# Patient Record
Sex: Female | Born: 1955 | Race: White | Hispanic: No | Marital: Married | Smoking: Never smoker
Health system: Southern US, Community
[De-identification: ages and names within clinical notes are randomized; demographics above are authoritative.]

## PROBLEM LIST (undated history)

## (undated) DIAGNOSIS — E785 Hyperlipidemia, unspecified: Secondary | ICD-10-CM

## (undated) DIAGNOSIS — T7840XA Allergy, unspecified, initial encounter: Secondary | ICD-10-CM

## (undated) DIAGNOSIS — R748 Abnormal levels of other serum enzymes: Secondary | ICD-10-CM

## (undated) DIAGNOSIS — M1990 Unspecified osteoarthritis, unspecified site: Secondary | ICD-10-CM

## (undated) DIAGNOSIS — C189 Malignant neoplasm of colon, unspecified: Secondary | ICD-10-CM

## (undated) DIAGNOSIS — D34 Benign neoplasm of thyroid gland: Secondary | ICD-10-CM

## (undated) DIAGNOSIS — K219 Gastro-esophageal reflux disease without esophagitis: Secondary | ICD-10-CM

## (undated) HISTORY — DX: Benign neoplasm of thyroid gland: D34

## (undated) HISTORY — DX: Hyperlipidemia, unspecified: E78.5

## (undated) HISTORY — PX: POLYPECTOMY: SHX149

## (undated) HISTORY — PX: INCISIONAL HERNIA REPAIR: SHX193

## (undated) HISTORY — PX: BIOPSY THYROID: PRO38

## (undated) HISTORY — PX: COLON SURGERY: SHX602

## (undated) HISTORY — DX: Malignant neoplasm of colon, unspecified: C18.9

## (undated) HISTORY — PX: PARTIAL HYSTERECTOMY: SHX80

## (undated) HISTORY — DX: Abnormal levels of other serum enzymes: R74.8

## (undated) HISTORY — PX: WRIST GANGLION EXCISION: SUR520

## (undated) HISTORY — DX: Gastro-esophageal reflux disease without esophagitis: K21.9

## (undated) HISTORY — DX: Unspecified osteoarthritis, unspecified site: M19.90

## (undated) HISTORY — DX: Allergy, unspecified, initial encounter: T78.40XA

## (undated) HISTORY — PX: UPPER GASTROINTESTINAL ENDOSCOPY: SHX188

## (undated) HISTORY — PX: NASAL SEPTUM SURGERY: SHX37

---

## 2000-06-21 ENCOUNTER — Encounter: Admission: RE | Admit: 2000-06-21 | Discharge: 2000-06-21 | Payer: Self-pay

## 2004-05-10 ENCOUNTER — Ambulatory Visit: Payer: Self-pay | Admitting: Gastroenterology

## 2004-05-18 ENCOUNTER — Ambulatory Visit (HOSPITAL_COMMUNITY): Admission: RE | Admit: 2004-05-18 | Discharge: 2004-05-18 | Payer: Self-pay | Admitting: Gastroenterology

## 2004-05-31 ENCOUNTER — Ambulatory Visit: Payer: Self-pay | Admitting: Gastroenterology

## 2004-06-04 ENCOUNTER — Ambulatory Visit: Payer: Self-pay | Admitting: Gastroenterology

## 2004-06-30 ENCOUNTER — Ambulatory Visit: Payer: Self-pay | Admitting: Gastroenterology

## 2004-07-09 ENCOUNTER — Emergency Department (HOSPITAL_COMMUNITY): Admission: EM | Admit: 2004-07-09 | Discharge: 2004-07-09 | Payer: Self-pay | Admitting: Emergency Medicine

## 2004-08-11 ENCOUNTER — Ambulatory Visit: Payer: Self-pay | Admitting: Gastroenterology

## 2005-10-21 ENCOUNTER — Ambulatory Visit: Payer: Self-pay | Admitting: Gastroenterology

## 2005-11-04 ENCOUNTER — Ambulatory Visit: Payer: Self-pay | Admitting: Gastroenterology

## 2005-11-17 ENCOUNTER — Ambulatory Visit: Payer: Self-pay | Admitting: Cardiology

## 2005-12-05 ENCOUNTER — Ambulatory Visit: Payer: Self-pay | Admitting: Gastroenterology

## 2006-02-14 DIAGNOSIS — C189 Malignant neoplasm of colon, unspecified: Secondary | ICD-10-CM

## 2006-02-14 HISTORY — DX: Malignant neoplasm of colon, unspecified: C18.9

## 2006-08-28 ENCOUNTER — Ambulatory Visit: Payer: Self-pay | Admitting: Gastroenterology

## 2006-10-12 ENCOUNTER — Encounter: Payer: Self-pay | Admitting: Gastroenterology

## 2006-10-12 ENCOUNTER — Ambulatory Visit: Payer: Self-pay | Admitting: Gastroenterology

## 2006-10-12 DIAGNOSIS — C182 Malignant neoplasm of ascending colon: Secondary | ICD-10-CM | POA: Insufficient documentation

## 2006-10-13 ENCOUNTER — Ambulatory Visit: Payer: Self-pay | Admitting: Cardiovascular Disease

## 2006-11-09 ENCOUNTER — Encounter (INDEPENDENT_AMBULATORY_CARE_PROVIDER_SITE_OTHER): Payer: Self-pay | Admitting: Surgery

## 2006-11-09 ENCOUNTER — Inpatient Hospital Stay (HOSPITAL_COMMUNITY): Admission: RE | Admit: 2006-11-09 | Discharge: 2006-11-13 | Payer: Self-pay | Admitting: Surgery

## 2006-11-15 ENCOUNTER — Ambulatory Visit: Payer: Self-pay | Admitting: Hematology & Oncology

## 2006-11-21 LAB — COMPREHENSIVE METABOLIC PANEL
ALT: 56 U/L — ABNORMAL HIGH (ref 0–35)
Albumin: 4.3 g/dL (ref 3.5–5.2)
Alkaline Phosphatase: 129 U/L — ABNORMAL HIGH (ref 39–117)
CO2: 23 mEq/L (ref 19–32)
Glucose, Bld: 107 mg/dL — ABNORMAL HIGH (ref 70–99)
Potassium: 4.3 mEq/L (ref 3.5–5.3)
Sodium: 138 mEq/L (ref 135–145)
Total Bilirubin: 0.2 mg/dL — ABNORMAL LOW (ref 0.3–1.2)
Total Protein: 7.6 g/dL (ref 6.0–8.3)

## 2006-11-21 LAB — CBC WITH DIFFERENTIAL/PLATELET
Basophils Absolute: 0.1 10*3/uL (ref 0.0–0.1)
EOS%: 2.9 % (ref 0.0–7.0)
Eosinophils Absolute: 0.3 10*3/uL (ref 0.0–0.5)
HGB: 10.5 g/dL — ABNORMAL LOW (ref 11.6–15.9)
NEUT#: 7.5 10*3/uL — ABNORMAL HIGH (ref 1.5–6.5)
RBC: 4.48 10*6/uL (ref 3.70–5.32)
RDW: 18.9 % — ABNORMAL HIGH (ref 11.3–14.5)
lymph#: 2.3 10*3/uL (ref 0.9–3.3)

## 2006-11-21 LAB — CEA: CEA: <0.5 ng/mL (ref 0.0–5.0)

## 2006-12-19 ENCOUNTER — Ambulatory Visit (HOSPITAL_COMMUNITY): Admission: RE | Admit: 2006-12-19 | Discharge: 2006-12-19 | Payer: Self-pay | Admitting: Hematology & Oncology

## 2007-01-16 ENCOUNTER — Ambulatory Visit: Payer: Self-pay | Admitting: Hematology & Oncology

## 2007-01-18 LAB — COMPREHENSIVE METABOLIC PANEL
AST: 65 U/L — ABNORMAL HIGH (ref 0–37)
BUN: 9 mg/dL (ref 6–23)
Calcium: 9 mg/dL (ref 8.4–10.5)
Chloride: 101 mEq/L (ref 96–112)
Creatinine, Ser: 0.68 mg/dL (ref 0.40–1.20)

## 2007-01-18 LAB — CBC WITH DIFFERENTIAL/PLATELET
Basophils Absolute: 0 10*3/uL (ref 0.0–0.1)
EOS%: 8.4 % — ABNORMAL HIGH (ref 0.0–7.0)
HCT: 30.1 % — ABNORMAL LOW (ref 34.8–46.6)
HGB: 9.6 g/dL — ABNORMAL LOW (ref 11.6–15.9)
MCH: 21.7 pg — ABNORMAL LOW (ref 26.0–34.0)
MCV: 67.9 fL — ABNORMAL LOW (ref 81.0–101.0)
NEUT%: 60.8 % (ref 39.6–76.8)
lymph#: 2.4 10*3/uL (ref 0.9–3.3)

## 2007-01-18 LAB — FERRITIN: Ferritin: 18 ng/mL (ref 10–291)

## 2007-01-18 LAB — CEA: CEA: 1.4 ng/mL (ref 0.0–5.0)

## 2007-02-12 DIAGNOSIS — G43909 Migraine, unspecified, not intractable, without status migrainosus: Secondary | ICD-10-CM | POA: Insufficient documentation

## 2007-03-01 ENCOUNTER — Ambulatory Visit: Payer: Self-pay | Admitting: Hematology & Oncology

## 2007-03-01 LAB — CBC & DIFF AND RETIC
BASO%: 0.6 % (ref 0.0–2.0)
HCT: 40.1 % (ref 34.8–46.6)
IRF: 0.34 — ABNORMAL HIGH (ref 0.130–0.330)
MCHC: 33.1 g/dL (ref 32.0–36.0)
MONO#: 0.6 10*3/uL (ref 0.1–0.9)
RBC: 5.2 10*6/uL (ref 3.70–5.32)
RDW: 30.2 % — ABNORMAL HIGH (ref 11.3–14.5)
Retic %: 2 % (ref 0.4–2.3)
WBC: 9.4 10*3/uL (ref 3.9–10.0)
lymph#: 2.6 10*3/uL (ref 0.9–3.3)

## 2007-03-01 LAB — CHCC SMEAR

## 2007-03-27 ENCOUNTER — Ambulatory Visit (HOSPITAL_COMMUNITY): Admission: RE | Admit: 2007-03-27 | Discharge: 2007-03-27 | Payer: Self-pay | Admitting: Hematology & Oncology

## 2007-06-06 DIAGNOSIS — K219 Gastro-esophageal reflux disease without esophagitis: Secondary | ICD-10-CM | POA: Insufficient documentation

## 2007-06-06 DIAGNOSIS — R160 Hepatomegaly, not elsewhere classified: Secondary | ICD-10-CM | POA: Insufficient documentation

## 2007-07-27 ENCOUNTER — Ambulatory Visit (HOSPITAL_COMMUNITY): Admission: RE | Admit: 2007-07-27 | Discharge: 2007-07-27 | Payer: Self-pay | Admitting: Hematology & Oncology

## 2007-08-01 ENCOUNTER — Ambulatory Visit: Payer: Self-pay | Admitting: Hematology & Oncology

## 2007-08-03 ENCOUNTER — Encounter: Payer: Self-pay | Admitting: Gastroenterology

## 2007-08-03 LAB — CBC WITH DIFFERENTIAL/PLATELET
Basophils Absolute: 0.1 10*3/uL (ref 0.0–0.1)
Eosinophils Absolute: 0.2 10*3/uL (ref 0.0–0.5)
HGB: 14.6 g/dL (ref 11.6–15.9)
MONO#: 0.4 10*3/uL (ref 0.1–0.9)
NEUT#: 6.1 10*3/uL (ref 1.5–6.5)
RBC: 4.61 10*6/uL (ref 3.70–5.32)
RDW: 13.2 % (ref 11.3–14.5)
WBC: 9.5 10*3/uL (ref 3.9–10.0)
lymph#: 2.6 10*3/uL (ref 0.9–3.3)

## 2007-08-04 LAB — COMPREHENSIVE METABOLIC PANEL
Albumin: 4.5 g/dL (ref 3.5–5.2)
BUN: 15 mg/dL (ref 6–23)
CO2: 21 mEq/L (ref 19–32)
Calcium: 9.2 mg/dL (ref 8.4–10.5)
Chloride: 104 mEq/L (ref 96–112)
Creatinine, Ser: 0.77 mg/dL (ref 0.40–1.20)
Glucose, Bld: 143 mg/dL — ABNORMAL HIGH (ref 70–99)

## 2007-08-04 LAB — CEA: CEA: 1 ng/mL (ref 0.0–5.0)

## 2007-09-28 ENCOUNTER — Ambulatory Visit: Payer: Self-pay | Admitting: Gastroenterology

## 2007-10-12 ENCOUNTER — Ambulatory Visit: Payer: Self-pay | Admitting: Gastroenterology

## 2007-11-02 ENCOUNTER — Ambulatory Visit (HOSPITAL_COMMUNITY): Admission: RE | Admit: 2007-11-02 | Discharge: 2007-11-02 | Payer: Self-pay | Admitting: Oncology

## 2007-11-06 ENCOUNTER — Encounter: Payer: Self-pay | Admitting: Gastroenterology

## 2007-11-09 ENCOUNTER — Ambulatory Visit (HOSPITAL_COMMUNITY): Admission: RE | Admit: 2007-11-09 | Discharge: 2007-11-09 | Payer: Self-pay | Admitting: Surgery

## 2007-12-18 DIAGNOSIS — K449 Diaphragmatic hernia without obstruction or gangrene: Secondary | ICD-10-CM | POA: Insufficient documentation

## 2007-12-18 DIAGNOSIS — Z8639 Personal history of other endocrine, nutritional and metabolic disease: Secondary | ICD-10-CM

## 2007-12-18 DIAGNOSIS — N83209 Unspecified ovarian cyst, unspecified side: Secondary | ICD-10-CM | POA: Insufficient documentation

## 2007-12-18 HISTORY — DX: Personal history of other endocrine, nutritional and metabolic disease: Z86.39

## 2007-12-19 ENCOUNTER — Ambulatory Visit (HOSPITAL_COMMUNITY): Admission: RE | Admit: 2007-12-19 | Discharge: 2007-12-20 | Payer: Self-pay | Admitting: Surgery

## 2008-01-29 ENCOUNTER — Ambulatory Visit: Payer: Self-pay | Admitting: Hematology & Oncology

## 2008-01-30 LAB — CBC WITH DIFFERENTIAL (CANCER CENTER ONLY)
BASO%: 1 % (ref 0.0–2.0)
EOS%: 3.2 % (ref 0.0–7.0)
Eosinophils Absolute: 0.3 10*3/uL (ref 0.0–0.5)
MCH: 30.4 pg (ref 26.0–34.0)
MONO%: 6.5 % (ref 0.0–13.0)
NEUT#: 6.1 10*3/uL (ref 1.5–6.5)
Platelets: 355 10*3/uL (ref 145–400)
RBC: 4.82 10*6/uL (ref 3.70–5.32)
RDW: 12 % (ref 10.5–14.6)
WBC: 10.5 10*3/uL — ABNORMAL HIGH (ref 3.9–10.0)

## 2008-01-30 LAB — COMPREHENSIVE METABOLIC PANEL
Albumin: 5 g/dL (ref 3.5–5.2)
Alkaline Phosphatase: 97 U/L (ref 39–117)
BUN: 12 mg/dL (ref 6–23)
CO2: 26 mEq/L (ref 19–32)
Calcium: 10.3 mg/dL (ref 8.4–10.5)
Chloride: 101 mEq/L (ref 96–112)
Glucose, Bld: 118 mg/dL — ABNORMAL HIGH (ref 70–99)
Potassium: 4.7 mEq/L (ref 3.5–5.3)

## 2008-03-19 DIAGNOSIS — K589 Irritable bowel syndrome without diarrhea: Secondary | ICD-10-CM | POA: Insufficient documentation

## 2008-04-14 DIAGNOSIS — J45909 Unspecified asthma, uncomplicated: Secondary | ICD-10-CM | POA: Insufficient documentation

## 2008-04-18 ENCOUNTER — Ambulatory Visit: Payer: Self-pay | Admitting: Hematology & Oncology

## 2008-04-22 ENCOUNTER — Encounter (INDEPENDENT_AMBULATORY_CARE_PROVIDER_SITE_OTHER): Payer: Self-pay | Admitting: *Deleted

## 2008-04-22 ENCOUNTER — Ambulatory Visit (HOSPITAL_COMMUNITY): Admission: RE | Admit: 2008-04-22 | Discharge: 2008-04-22 | Payer: Self-pay | Admitting: Hematology & Oncology

## 2008-04-22 ENCOUNTER — Ambulatory Visit: Payer: Self-pay | Admitting: Hematology & Oncology

## 2008-04-22 LAB — CBC WITH DIFFERENTIAL/PLATELET
BASO%: 0.7 % (ref 0.0–2.0)
EOS%: 1.8 % (ref 0.0–7.0)
MCH: 30.3 pg (ref 25.1–34.0)
MCHC: 34.1 g/dL (ref 31.5–36.0)
NEUT%: 57 % (ref 38.4–76.8)
RDW: 12.9 % (ref 11.2–14.5)
lymph#: 2.7 10*3/uL (ref 0.9–3.3)

## 2008-04-22 LAB — COMPREHENSIVE METABOLIC PANEL
AST: 72 U/L — ABNORMAL HIGH (ref 0–37)
Albumin: 4.4 g/dL (ref 3.5–5.2)
Alkaline Phosphatase: 74 U/L (ref 39–117)
BUN: 11 mg/dL (ref 6–23)
Potassium: 4.1 mEq/L (ref 3.5–5.3)
Total Bilirubin: 0.4 mg/dL (ref 0.3–1.2)

## 2008-05-01 ENCOUNTER — Encounter: Payer: Self-pay | Admitting: Gastroenterology

## 2008-06-19 DIAGNOSIS — E1169 Type 2 diabetes mellitus with other specified complication: Secondary | ICD-10-CM | POA: Insufficient documentation

## 2008-07-28 ENCOUNTER — Telehealth: Payer: Self-pay | Admitting: Gastroenterology

## 2008-08-04 ENCOUNTER — Ambulatory Visit: Payer: Self-pay | Admitting: Internal Medicine

## 2008-08-04 DIAGNOSIS — Z85038 Personal history of other malignant neoplasm of large intestine: Secondary | ICD-10-CM | POA: Insufficient documentation

## 2008-08-04 DIAGNOSIS — R110 Nausea: Secondary | ICD-10-CM | POA: Insufficient documentation

## 2008-08-04 DIAGNOSIS — R1011 Right upper quadrant pain: Secondary | ICD-10-CM | POA: Insufficient documentation

## 2008-08-05 ENCOUNTER — Telehealth: Payer: Self-pay | Admitting: Nurse Practitioner

## 2008-08-07 ENCOUNTER — Telehealth: Payer: Self-pay | Admitting: Nurse Practitioner

## 2008-08-07 LAB — CONVERTED CEMR LAB
Albumin: 4 g/dL (ref 3.5–5.2)
Alkaline Phosphatase: 83 units/L (ref 39–117)
Bilirubin, Direct: 0.1 mg/dL (ref 0.0–0.3)

## 2008-08-08 ENCOUNTER — Ambulatory Visit: Payer: Self-pay | Admitting: Nurse Practitioner

## 2008-08-08 LAB — CONVERTED CEMR LAB
A-1 Antitrypsin, Ser: 97 mg/dL (ref 83–200)
Ferritin: 201.3 ng/mL (ref 10.0–291.0)
HCV Ab: NEGATIVE
Saturation Ratios: 13.3 % — ABNORMAL LOW (ref 20.0–50.0)

## 2008-08-11 ENCOUNTER — Telehealth (INDEPENDENT_AMBULATORY_CARE_PROVIDER_SITE_OTHER): Payer: Self-pay | Admitting: *Deleted

## 2008-08-14 ENCOUNTER — Telehealth: Payer: Self-pay | Admitting: Nurse Practitioner

## 2008-08-15 ENCOUNTER — Encounter: Payer: Self-pay | Admitting: Gastroenterology

## 2008-08-15 ENCOUNTER — Ambulatory Visit: Payer: Self-pay | Admitting: Hematology & Oncology

## 2008-08-19 ENCOUNTER — Ambulatory Visit: Payer: Self-pay | Admitting: Nurse Practitioner

## 2008-08-19 ENCOUNTER — Ambulatory Visit (HOSPITAL_COMMUNITY): Admission: RE | Admit: 2008-08-19 | Discharge: 2008-08-19 | Payer: Self-pay | Admitting: Hematology & Oncology

## 2008-08-22 ENCOUNTER — Telehealth: Payer: Self-pay | Admitting: Gastroenterology

## 2008-08-22 LAB — CONVERTED CEMR LAB
ALT: 86 units/L — ABNORMAL HIGH (ref 0–35)
AST: 56 units/L — ABNORMAL HIGH (ref 0–37)
Bilirubin, Direct: 0.1 mg/dL (ref 0.0–0.3)
Total Protein: 7.5 g/dL (ref 6.0–8.3)

## 2008-08-27 ENCOUNTER — Ambulatory Visit: Payer: Self-pay | Admitting: Gastroenterology

## 2008-08-27 DIAGNOSIS — E119 Type 2 diabetes mellitus without complications: Secondary | ICD-10-CM | POA: Insufficient documentation

## 2008-08-27 DIAGNOSIS — R945 Abnormal results of liver function studies: Secondary | ICD-10-CM | POA: Insufficient documentation

## 2008-10-28 ENCOUNTER — Ambulatory Visit: Payer: Self-pay | Admitting: Hematology & Oncology

## 2008-10-30 ENCOUNTER — Encounter: Payer: Self-pay | Admitting: Gastroenterology

## 2008-10-30 LAB — COMPREHENSIVE METABOLIC PANEL
ALT: 97 U/L — ABNORMAL HIGH (ref 0–35)
AST: 75 U/L — ABNORMAL HIGH (ref 0–37)
Albumin: 4.7 g/dL (ref 3.5–5.2)
Alkaline Phosphatase: 102 U/L (ref 39–117)
BUN: 13 mg/dL (ref 6–23)
Calcium: 10 mg/dL (ref 8.4–10.5)
Chloride: 99 mEq/L (ref 96–112)
Potassium: 4 mEq/L (ref 3.5–5.3)
Sodium: 136 mEq/L (ref 135–145)
Total Protein: 8 g/dL (ref 6.0–8.3)

## 2008-10-30 LAB — CBC WITH DIFFERENTIAL (CANCER CENTER ONLY)
BASO#: 0.1 10*3/uL (ref 0.0–0.2)
EOS%: 2.1 % (ref 0.0–7.0)
LYMPH%: 33.4 % (ref 14.0–48.0)
MCH: 30.5 pg (ref 26.0–34.0)
MCHC: 34 g/dL (ref 32.0–36.0)
MONO%: 6.4 % (ref 0.0–13.0)
NEUT#: 5.5 10*3/uL (ref 1.5–6.5)
Platelets: 319 10*3/uL (ref 145–400)

## 2008-10-30 LAB — CEA: CEA: 1.4 ng/mL (ref 0.0–5.0)

## 2008-12-19 DIAGNOSIS — E559 Vitamin D deficiency, unspecified: Secondary | ICD-10-CM | POA: Insufficient documentation

## 2009-02-10 ENCOUNTER — Ambulatory Visit (HOSPITAL_COMMUNITY): Admission: RE | Admit: 2009-02-10 | Discharge: 2009-02-10 | Payer: Self-pay | Admitting: Hematology & Oncology

## 2009-02-10 ENCOUNTER — Ambulatory Visit: Payer: Self-pay | Admitting: Hematology

## 2009-02-10 LAB — BASIC METABOLIC PANEL
CO2: 25 mEq/L (ref 19–32)
Calcium: 9.4 mg/dL (ref 8.4–10.5)
Creatinine, Ser: 0.67 mg/dL (ref 0.40–1.20)
Sodium: 133 mEq/L — ABNORMAL LOW (ref 135–145)

## 2009-03-03 ENCOUNTER — Ambulatory Visit: Payer: Self-pay | Admitting: Hematology & Oncology

## 2009-03-05 ENCOUNTER — Encounter: Payer: Self-pay | Admitting: Gastroenterology

## 2009-03-05 LAB — CBC WITH DIFFERENTIAL (CANCER CENTER ONLY)
BASO%: 0.8 % (ref 0.0–2.0)
EOS%: 2.7 % (ref 0.0–7.0)
LYMPH#: 3.2 10*3/uL (ref 0.9–3.3)
MCHC: 34.1 g/dL (ref 32.0–36.0)
NEUT#: 5.4 10*3/uL (ref 1.5–6.5)
NEUT%: 57.5 % (ref 39.6–80.0)
RDW: 12.3 % (ref 10.5–14.6)

## 2009-03-06 LAB — COMPREHENSIVE METABOLIC PANEL
Alkaline Phosphatase: 109 U/L (ref 39–117)
Creatinine, Ser: 0.66 mg/dL (ref 0.40–1.20)
Glucose, Bld: 229 mg/dL — ABNORMAL HIGH (ref 70–99)
Sodium: 136 mEq/L (ref 135–145)
Total Bilirubin: 0.3 mg/dL (ref 0.3–1.2)
Total Protein: 7.9 g/dL (ref 6.0–8.3)

## 2009-06-11 ENCOUNTER — Ambulatory Visit: Payer: Self-pay | Admitting: Hematology

## 2009-06-12 ENCOUNTER — Encounter: Payer: Self-pay | Admitting: Nurse Practitioner

## 2009-06-12 ENCOUNTER — Ambulatory Visit (HOSPITAL_COMMUNITY): Admission: RE | Admit: 2009-06-12 | Discharge: 2009-06-12 | Payer: Self-pay | Admitting: Hematology & Oncology

## 2009-06-12 LAB — COMPREHENSIVE METABOLIC PANEL
ALT: 82 U/L — ABNORMAL HIGH (ref 0–35)
AST: 65 U/L — ABNORMAL HIGH (ref 0–37)
Alkaline Phosphatase: 103 U/L (ref 39–117)
CO2: 24 mEq/L (ref 19–32)
Creatinine, Ser: 0.72 mg/dL (ref 0.40–1.20)
Sodium: 136 mEq/L (ref 135–145)
Total Bilirubin: 0.5 mg/dL (ref 0.3–1.2)
Total Protein: 8.9 g/dL — ABNORMAL HIGH (ref 6.0–8.3)

## 2009-06-12 LAB — CBC WITH DIFFERENTIAL/PLATELET
BASO%: 0.5 % (ref 0.0–2.0)
EOS%: 1.6 % (ref 0.0–7.0)
Eosinophils Absolute: 0.1 10*3/uL (ref 0.0–0.5)
LYMPH%: 36 % (ref 14.0–49.7)
MCH: 31.2 pg (ref 25.1–34.0)
MCHC: 34.7 g/dL (ref 31.5–36.0)
MCV: 90 fL (ref 79.5–101.0)
MONO%: 6.7 % (ref 0.0–14.0)
NEUT#: 4.6 10*3/uL (ref 1.5–6.5)
Platelets: 287 10*3/uL (ref 145–400)
RBC: 4.7 10*6/uL (ref 3.70–5.45)
RDW: 13.4 % (ref 11.2–14.5)

## 2009-06-12 LAB — VITAMIN D 25 HYDROXY (VIT D DEFICIENCY, FRACTURES): Vit D, 25-Hydroxy: 48 ng/mL (ref 30–89)

## 2009-06-12 LAB — CEA: CEA: 1.3 ng/mL (ref 0.0–5.0)

## 2009-06-17 ENCOUNTER — Ambulatory Visit: Payer: Self-pay | Admitting: Hematology & Oncology

## 2009-06-18 ENCOUNTER — Encounter: Payer: Self-pay | Admitting: Gastroenterology

## 2009-06-25 ENCOUNTER — Telehealth: Payer: Self-pay | Admitting: Gastroenterology

## 2009-06-26 ENCOUNTER — Ambulatory Visit: Payer: Self-pay | Admitting: Gastroenterology

## 2009-06-26 DIAGNOSIS — K625 Hemorrhage of anus and rectum: Secondary | ICD-10-CM | POA: Insufficient documentation

## 2009-06-26 DIAGNOSIS — K6289 Other specified diseases of anus and rectum: Secondary | ICD-10-CM | POA: Insufficient documentation

## 2009-06-29 LAB — CONVERTED CEMR LAB
Basophils Absolute: 0.1 10*3/uL (ref 0.0–0.1)
Basophils Relative: 0.5 % (ref 0.0–3.0)
Eosinophils Absolute: 0.2 10*3/uL (ref 0.0–0.7)
Lymphocytes Relative: 34.8 % (ref 12.0–46.0)
MCHC: 34.1 g/dL (ref 30.0–36.0)
Neutrophils Relative %: 57.4 % (ref 43.0–77.0)
Platelets: 349 10*3/uL (ref 150.0–400.0)
RBC: 4.83 M/uL (ref 3.87–5.11)

## 2009-07-09 ENCOUNTER — Encounter: Payer: Self-pay | Admitting: Gastroenterology

## 2009-07-29 ENCOUNTER — Ambulatory Visit: Payer: Self-pay | Admitting: Gastroenterology

## 2009-07-29 DIAGNOSIS — R1310 Dysphagia, unspecified: Secondary | ICD-10-CM | POA: Insufficient documentation

## 2009-08-20 ENCOUNTER — Ambulatory Visit: Payer: Self-pay | Admitting: Gastroenterology

## 2009-08-25 ENCOUNTER — Encounter: Payer: Self-pay | Admitting: Gastroenterology

## 2009-08-27 ENCOUNTER — Telehealth (INDEPENDENT_AMBULATORY_CARE_PROVIDER_SITE_OTHER): Payer: Self-pay | Admitting: *Deleted

## 2009-08-31 ENCOUNTER — Telehealth: Payer: Self-pay | Admitting: Gastroenterology

## 2009-09-03 ENCOUNTER — Telehealth: Payer: Self-pay | Admitting: Gastroenterology

## 2009-09-14 ENCOUNTER — Telehealth: Payer: Self-pay | Admitting: Gastroenterology

## 2009-09-18 ENCOUNTER — Ambulatory Visit: Payer: Self-pay | Admitting: Gastroenterology

## 2009-09-18 ENCOUNTER — Telehealth: Payer: Self-pay | Admitting: Gastroenterology

## 2009-09-26 ENCOUNTER — Encounter: Payer: Self-pay | Admitting: Gastroenterology

## 2009-10-20 ENCOUNTER — Ambulatory Visit: Payer: Self-pay | Admitting: Gastroenterology

## 2009-10-20 DIAGNOSIS — A048 Other specified bacterial intestinal infections: Secondary | ICD-10-CM | POA: Insufficient documentation

## 2009-10-20 DIAGNOSIS — K222 Esophageal obstruction: Secondary | ICD-10-CM | POA: Insufficient documentation

## 2009-12-18 ENCOUNTER — Ambulatory Visit: Payer: Self-pay | Admitting: Hematology & Oncology

## 2009-12-18 LAB — COMPREHENSIVE METABOLIC PANEL
AST: 29 U/L (ref 0–37)
Albumin: 4.6 g/dL (ref 3.5–5.2)
BUN: 13 mg/dL (ref 6–23)
CO2: 21 mEq/L (ref 19–32)
Calcium: 9.7 mg/dL (ref 8.4–10.5)
Chloride: 104 mEq/L (ref 96–112)
Creatinine, Ser: 0.73 mg/dL (ref 0.40–1.20)
Potassium: 4.3 mEq/L (ref 3.5–5.3)

## 2010-01-01 ENCOUNTER — Encounter: Payer: Self-pay | Admitting: Gastroenterology

## 2010-02-14 IMAGING — CT CT CHEST W/ CM
2 of 5 series · 16 of 46 positions shown, 18 images · IV contrast (agent unspecified)
Comparison: 11/02/2007

CT CHEST

CLINICAL DATA: Recurrent colon cancer

CT CHEST, ABDOMEN AND PELVIS WITH CONTRAST
TECHNIQUE: Multidetector CT imaging of the chest, abdomen and
pelvis was performed following the standard protocol during bolus
administration of intravenous contrast.
Contrast: 100 ml of omni 300

[Series 2: cap with st · axial · 0.79mm/px · z∈[-609,-74]mm · 13 of 121 slices shown, 15 images]
[im 7/121  soft-tissue]
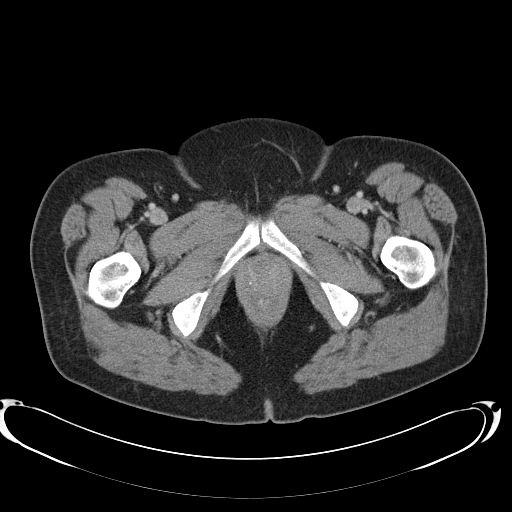
[im 7/121  bone]
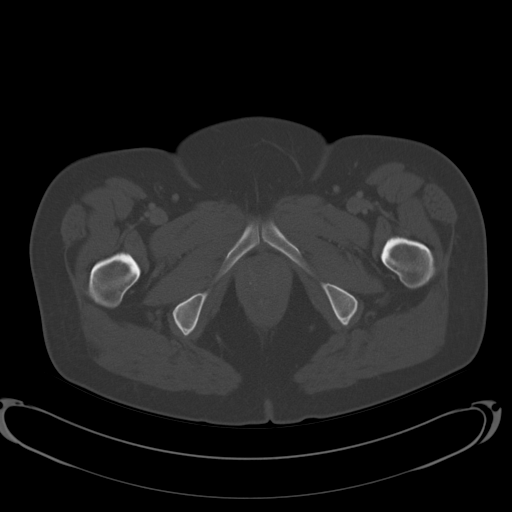
[im 19/121  soft-tissue]
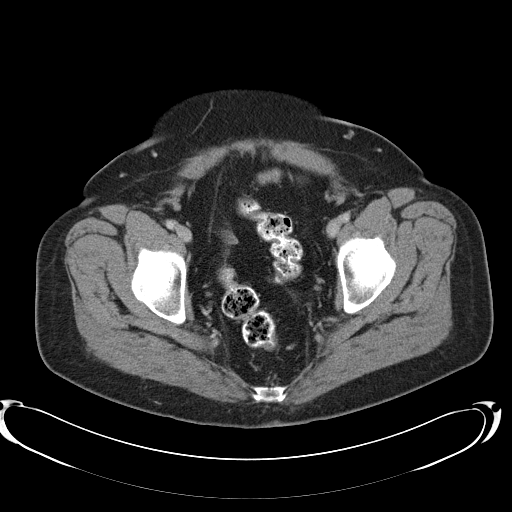
[im 26/121  soft-tissue]
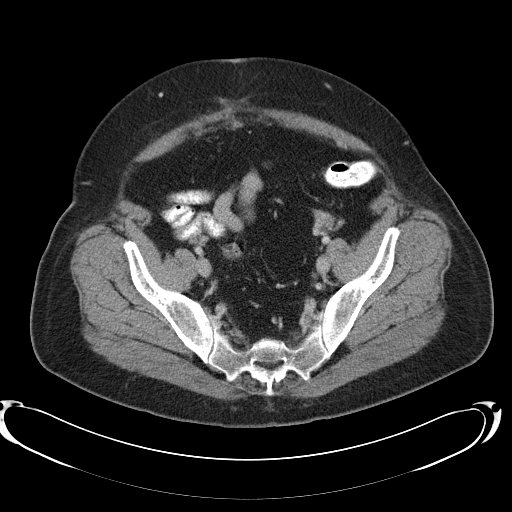
[im 32/121  soft-tissue]
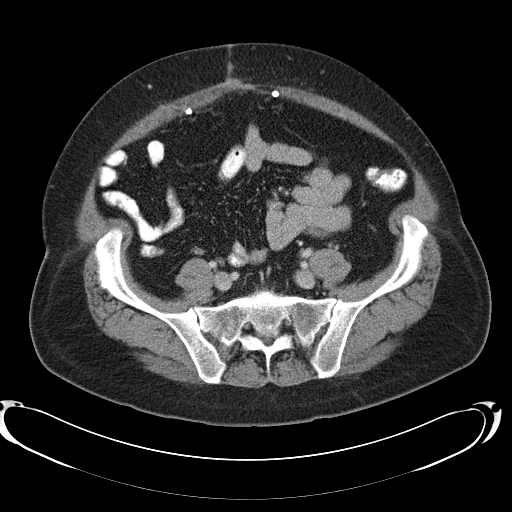
[im 45/121  soft-tissue]
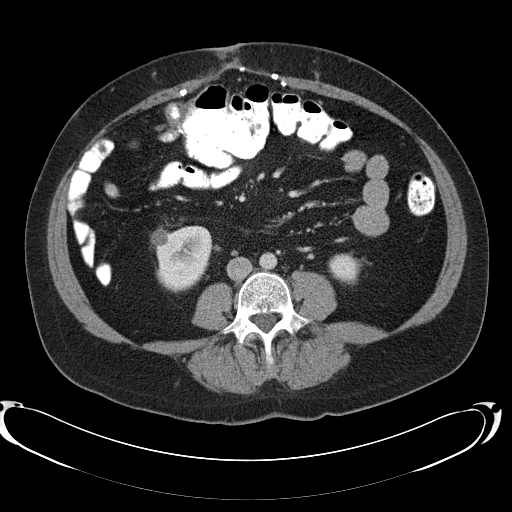
[im 51/121  soft-tissue]
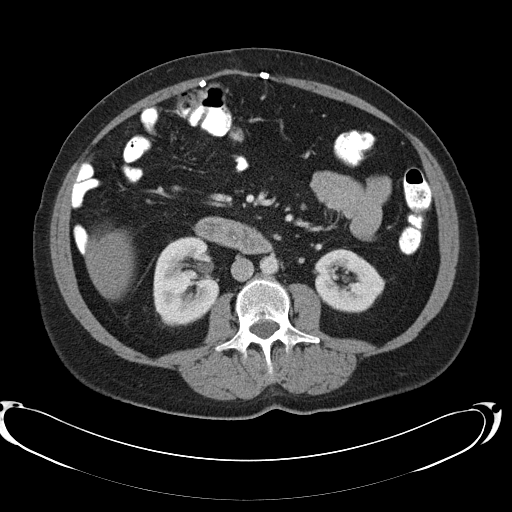
[im 64/121  soft-tissue]
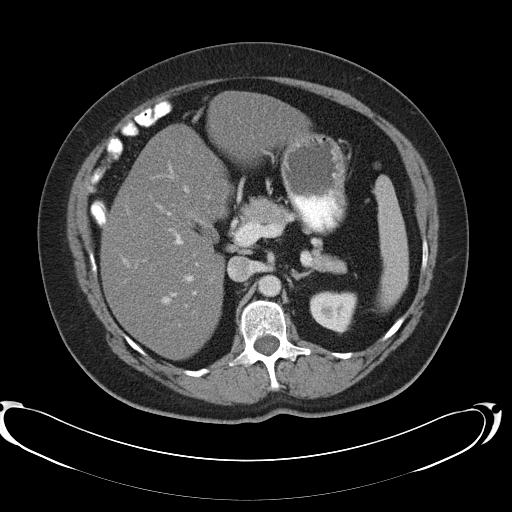
[im 70/121  soft-tissue]
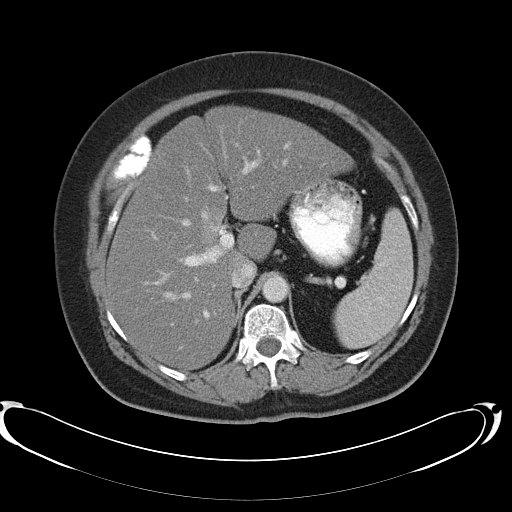
[im 76/121  soft-tissue]
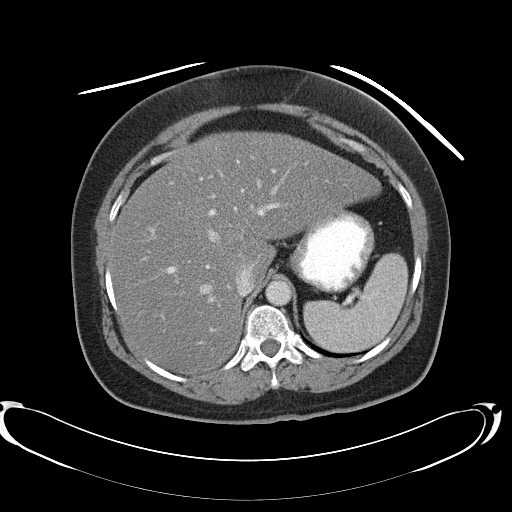
[im 76/121  bone]
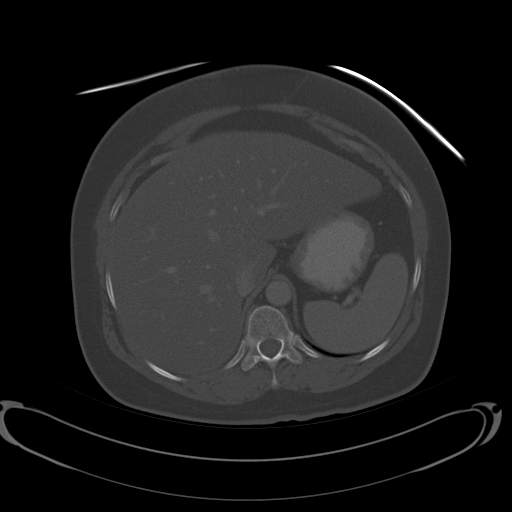
[im 89/121  soft-tissue]
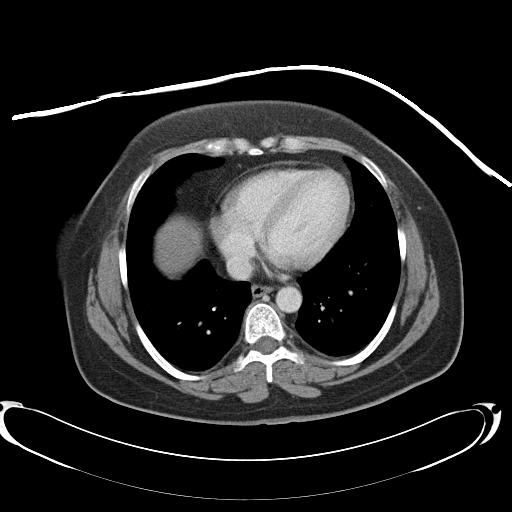
[im 95/121  soft-tissue]
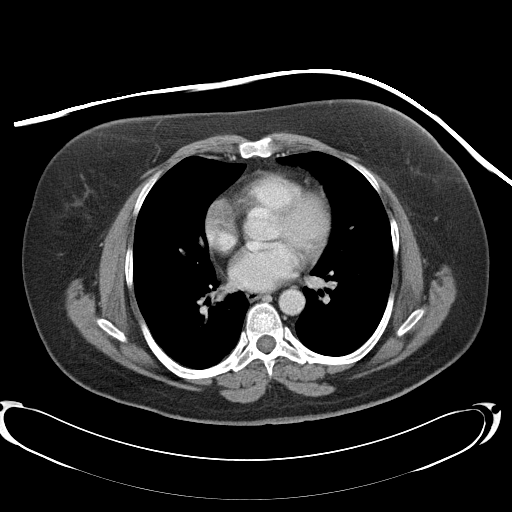
[im 102/121  soft-tissue]
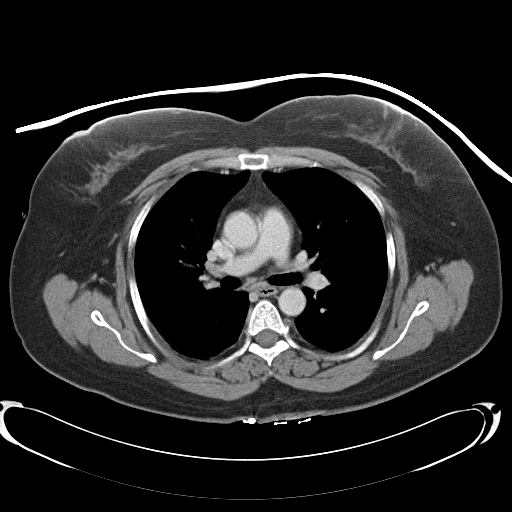
[im 114/121  soft-tissue]
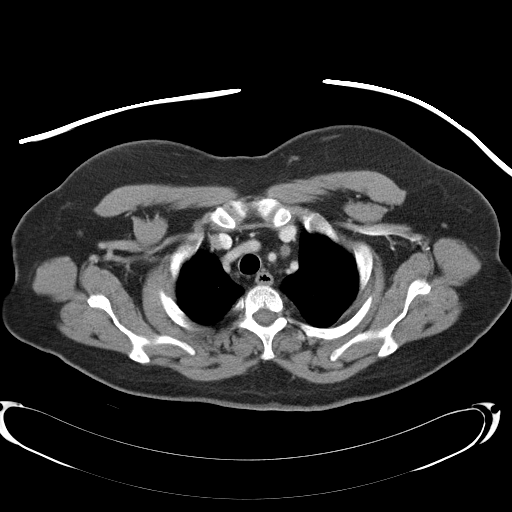

[Series 602: <mpr thick range> · coronal · 1.18mm/px · 3 of 91 slices shown]
[im 31/91  soft-tissue]
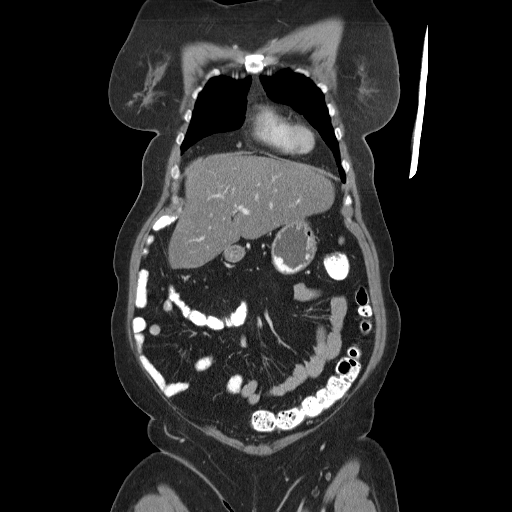
[im 41/91  soft-tissue]
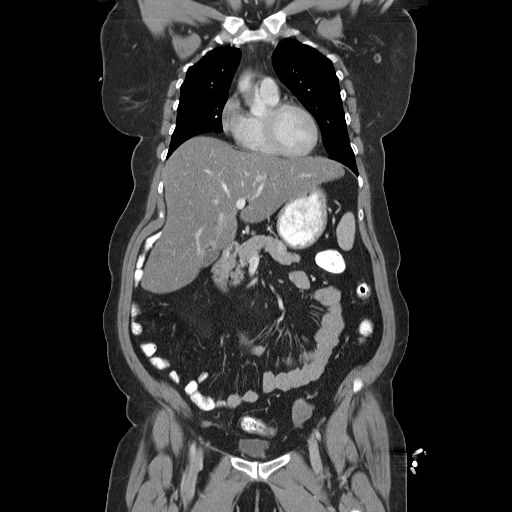
[im 51/91  soft-tissue]
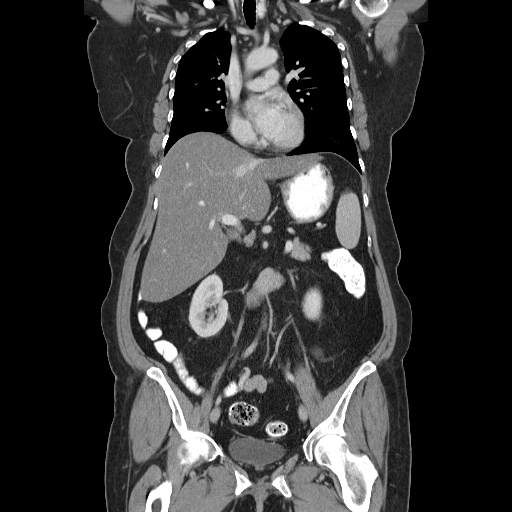

[16 of 46 positions shown; findings below may reference images not displayed]

FINDINGS: No enlarged axillary or supraclavicular lymph nodes identified.

No enlarged mediastinal or hilar lymph nodes.

There is no pericardial or pleural effusion.

The lungs are clear.

Review of the visualized axial and appendicular skeleton is
unremarkable.
IMPRESSION: 1.  No evidence for metastatic disease.

CT ABDOMEN
FINDINGS: There is fatty infiltration of the liver parenchyma.

No focal liver abnormalities are identified.

The gallbladder is normal.  No biliary dilatation.

The spleen is normal.

Both adrenal glands appear normal.

The pancreas appears normal.

The left kidney appears normal.

Low density structure arising from the inferior pole the right
kidney is stable measuring 14 mm, image 77.

There are no enlarged retroperitoneal or small bowel mesenteric
lymph nodes.

The patient is status post right colectomy with enterocolonic
anastomoses.

Hernia mesh is identified along the undersurface of the ventral
abdominal wall.

No upper abdominal ascites.

No peritoneal nodule or mass identified.
IMPRESSION: 1.  Stable CT of the upper abdomen without specific features for
metastatic disease.
2.  Fatty infiltration of the liver.
3.  Stable indeterminate low density structure arising from the
inferior pole of the right kidney.

CT PELVIS
FINDINGS: Visualized colon and small bowel are unremarkable.

No free fluid or abnormal fluid collections.

No significant lymphadenopathy.

Urinary bladder is normal.

The patient is status post hysterectomy.

The adnexal structures appear normal.
IMPRESSION: 1.  No specific evidence for metastatic disease.

## 2010-03-08 ENCOUNTER — Encounter: Payer: Self-pay | Admitting: Internal Medicine

## 2010-03-08 ENCOUNTER — Encounter: Payer: Self-pay | Admitting: Hematology & Oncology

## 2010-03-16 NOTE — Miscellaneous (Signed)
Summary: rx for prevpak  Clinical Lists Changes  Medications: Added new medication of PREVPAC   MISC (AMOXICILL-CLARITHRO-LANSOPRAZ) Take as directed - Signed Rx of PREVPAC   MISC (AMOXICILL-CLARITHRO-LANSOPRAZ) Take as directed;  #1 x 0;  Signed;  Entered by: Margie Ege;  Authorized by: Inda Castle MD;  Method used: Electronically to [facility] [phone]*, [address] [place], [place], [place]  [address], Ph: [phone] or [phone], Fax: [phone] Allergies: Added new allergy or adverse reaction of * SPIRVIRA Added new allergy or adverse reaction of FLAGYL    Prescriptions: PREVPAC   MISC (AMOXICILL-CLARITHRO-LANSOPRAZ) Take as directed  #1 x 0   Entered by:   Margie Ege   Authorized by:   Inda Castle MD   Signed by:   Margie Ege on 08/27/2009   Method used:   Electronically to        [facility] [phone]* (retail)       [address] [place], [place]  [address]       Ph: [phone] or [phone]       Fax: [phone]   RxID:   [id number]

## 2010-03-16 NOTE — Progress Notes (Signed)
Summary: Triage   Phone Note Call from Patient Call back at Home Phone [phone] Call back at [facility]# [phone]   Caller: Patient Call For: Dr. Deatra Ina Reason for Call: Talk to Nurse Details for Reason: Question about Lab Summary of Call: Pt. called. She was told she needed to pick something up at the lab. Questions whether it is just to pick something up or does she actually need to have labs done. If you could call within the next 30 minutes as she has to pick her mom up from the hospital. Thanks. Initial call taken by: Darliss Ridgel,  September 18, 2009 8:13 AM  Follow-up for Phone Call        Pt. needs to do stool study for H-Pylori. Message left for pt. to go by the lab today or Monday to get stool container. Pt. to keep scheduled office visit on 10-20-09. Pt. instructed to call back as needed.   Follow-up by: Vivia Ewing LPN,  September 18, 4253 8:34 AM

## 2010-03-16 NOTE — Progress Notes (Signed)
Summary: Triage  Medications Added LEVAQUIN 500 MG TABS (LEVOFLOXACIN) Take 1 p.o. starting 09/16/09 x 7 days FLUCONAZOLE 100 MG TABS (FLUCONAZOLE) Take 1 p.o. every day x 7 days. start this med. on 09/23/09 after Levaquin finished       Phone Note Call from Patient Call back at [facility] Phone [phone]   Caller: Patient Call For: Dr. Deatra Ina Reason for Call: Talk to Nurse Summary of Call: has questions about her meds. and about yeast in her mouth Initial call taken by: Webb Laws,  September 14, 2009 8:07 AM  Follow-up for Phone Call        Pt. finishes Clarithromycin tomorrow and says pharmacy told her Levaquin  rx. was cx. because how she was told to seperate meds. is not the way meds. were ordered so I will send new rx.. Asking  if she should continue Prevacid while on Levaquin.Started getting white coating on roof  of mouth and thinks it is yeast as she had to be treated with Diflucan before for yeast. Follow-up by: Abel Presto RN,  September 14, 2009 9:52 AM  Additional Follow-up for Phone Call Additional follow up Details #1::        Continue diflucan Can take flucanazole 176m once daily for 7 days after she finishes clarithromycin. Additional Follow-up by: RInda CastleMD,  September 14, 2009 10:18 AM    Additional Follow-up for Phone Call Additional follow up Details #2::    Please clarify if Levaquin is to be ordered or just finsih Clarythromcyin tomorrow. Follow-up by: CAbel PrestoRN,  September 14, 2009 10:25 AM  Additional Follow-up for Phone Call Additional follow up Details #3:: Details for Additional Follow-up Action Taken: She should take levaquin, then flucanazole after finishing levaquin  New/Updated Medications: LEVAQUIN 500 MG TABS (LEVOFLOXACIN) Take 1 p.o. starting 09/16/09 x 7 days FLUCONAZOLE 100 MG TABS (FLUCONAZOLE) Take 1 p.o. every day x 7 days. start this med. on 09/23/09 after Levaquin finished Prescriptions: FLUCONAZOLE 100 MG TABS (FLUCONAZOLE) Take 1  p.o. every day x 7 days. start this med. on 09/23/09 after Levaquin finished  #7 x 0   Entered by:   CAbel PrestoRN   Authorized by:   RInda CastleMD   Signed by:   CAbel PrestoRN on 09/14/2009   Method used:   Electronically to        [facility]#[phone] (retail)       [address] [place]  [address]      Ph: [phone] [phone]      Fax: [phone]  RxID:   [id number] 500 MG TABS (LEVOFLOXACIN) Take 1 p.o. starting 09/16/09 x 7 days  #7 x 0   Entered by:   CAbel PrestoRN   Authorized by:   RInda CastleMD   Signed by:   CAbel PrestoRN on 09/14/2009   Method used:   Electronically to        [facility][phone] (retail)       [address]   [address]      Ph: [phone] [phone]      Fax: [phone]  RxID:   [id number]  Appended Document: Triage Pt. instructed on how  to take the meds. ordered by Dr.Kaplan

## 2010-03-16 NOTE — Procedures (Signed)
Summary: Upper Endoscopy  Patient: Stephanie Carroll Note: All result statuses are Final unless otherwise noted.  Tests: (1) Upper Endoscopy (EGD)   EGD Upper Endoscopy       DONE (C)     [facility]     [place], [place]  [address]           ENDOSCOPY PROCEDURE REPORT           PATIENT:  Stephanie Carroll, Stephanie Carroll  MR#:  [id number]     BIRTHDATE:  10/29/1955, 57 yrs. old  GENDER:  female           ENDOSCOPIST:  Sandy Salaam. Deatra Ina, MD     Referred by:           PROCEDURE DATE:  08/20/2009     PROCEDURE:  EGD with biopsy, Maloney Dilation of Esophagus     ASA CLASS:  Class II     INDICATIONS:  dysphagia           MEDICATIONS:   There was residual sedation effect present from     prior procedure., glycopyrrolate (Robinal) 0.2 mg IV, 0.6cc     simethancone 0.6 cc PO, fentanyl 25ug IV, versed 45m IV     TOPICAL ANESTHETIC:  Exactacain Spray           DESCRIPTION OF PROCEDURE:   After the risks benefits and     alternatives of the procedure were thoroughly explained, informed     consent was obtained.  The LB GIF-H180 [id number] was     introduced through the mouth and advanced to the third portion of     the duodenum, without limitations.  The instrument was slowly     withdrawn as the mucosa was fully examined.     <<PROCEDUREIMAGES>>           polyps, # of Three polyps were found in the antrum. At least 3     polyps with friable mucosa measuring 2-563m Bxs taken (see image6,     image7, and image5).  A stricture was found at the     gastroesophageal junction (see image4). Early esophageal stricture     Dilation with maloney dilator 1819minimal resistance; no heme     Esophagitis was found in the mid esophagus. Area of discreet     desquamated mucosa extending 2-3cm (questionable resolving     esophagitis). Bxs taken (see image2 and image3).  Otherwise the     examination was normal.    Retroflexed views revealed no     abnormalities.    The scope was then  withdrawn from the patient     and the procedure completed.           COMPLICATIONS:  None           ENDOSCOPIC IMPRESSION:     1) Polyps, # of Three polyps in the antrum     2) Stricture at the gastroesophageal junction - s/p maloney     dilitation     3) Questionable Esophagitis in the mid esophagus     4) Otherwise normal examination     RECOMMENDATIONS:     1) continue PPI     2) Call office next 2-3 days to schedule an office appointment     for 6 weeks           REPEAT EXAM:  No           ______________________________  Sandy Salaam. Deatra Ina, MD           CC:  Loel Lofty MD, Burney Gauze, MD           n.     REVISED:  08/27/2009 09:14 AM     eSIGNED:   Sandy Salaam. Pearson Reasons at 08/27/2009 09:14 AM           [place], [place], [phone]  Note: An exclamation mark (!) indicates a result that was not dispersed into the flowsheet. Document Creation Date: 08/27/2009 9:14 AM _______________________________________________________________________  (1) Order result status: Final Collection or observation date-time: 08/20/2009 14:35 Requested date-time:  Receipt date-time:  Reported date-time:  Referring Physician:   Ordering Physician: Erskine Emery [phone]) Specimen Source:  Source: Tawanna Cooler Order Number: [id number] Lab site:

## 2010-03-16 NOTE — Letter (Signed)
Summary: Diabetic Instructions  [facility]  [place], [place] [address]   Phone: [phone]  Fax: [phone]    Stephanie Carroll 04-04-1955 MRN: [id number]   X   ORAL DIABETIC MEDICATION INSTRUCTIONS  The day before your procedure:   Take your diabetic pill as you do normally  The day of your procedure:   Do not take your diabetic pill    We will check your blood sugar levels during the admission process and again in Recovery before discharging you home  ________________________________________________________________________  _  _   INSULIN (LONG ACTING) MEDICATION INSTRUCTIONS (Lantus, NPH, 70/30, Humulin, Novolin-N)   The day before your procedure:   Take  your regular evening dose    The day of your procedure:   Do not take your morning dose    _  _   INSULIN (SHORT ACTING) MEDICATION INSTRUCTIONS (Regular, Humulog, Novolog)   The day before your procedure:   Do not take your evening dose   The day of your procedure:   Do not take your morning dose   _  _   INSULIN PUMP MEDICATION INSTRUCTIONS  We will contact the physician managing your diabetic care for written dosage instructions for the day before your procedure and the day of your procedure.  Once we have received the instructions, we will contact you.

## 2010-03-16 NOTE — Assessment & Plan Note (Signed)
Summary: F/U FROM ENDO, FLEX AND POSITIVE H-PYLORI            Stephanie Carroll    History of Present Illness Visit Type: Follow-up Visit Primary GI MD: Erskine Emery MD [facility] Primary Provider: Daiva Huge, PA Requesting Provider: na  Chief Complaint: F/u from endo, flex, and positive h-pylori. Pt states that she is still having trouble with swallowing pills but denies any other GI complaints  History of Present Illness:   [name] has returned following sigmoidoscopy and upper endoscopy.  The former was unremarkable.  Endoscopy demonstrated benign gastric polyps, and an esophageal stricture which was dilated, and biopsies demonstrated H. pylori.  She was treated accordingly.  At this time she has no GI complaints except for occasional dysphagia to pills only.  Rectal bleeding was felt secondary to hemorrhoids.  This has not recurred.   GI Review of Systems      Denies abdominal pain, acid reflux, belching, bloating, chest pain, dysphagia with liquids, dysphagia with solids, heartburn, loss of appetite, nausea, vomiting, vomiting blood, weight loss, and  weight gain.        Denies anal fissure, black tarry stools, change in bowel habit, constipation, diarrhea, diverticulosis, fecal incontinence, heme positive stool, hemorrhoids, irritable bowel syndrome, jaundice, light color stool, liver problems, rectal bleeding, and  rectal pain.    Current Medications (verified): 1)  Aciphex 20 Mg Tbec (Rabeprazole Sodium) .Marland Kitchen.. 1 Tablet By Mouth Two Times A Day 2)  Asmanex 30 Metered Doses 220 Mcg/inh Aepb (Mometasone Furoate) .Marland Kitchen.. 1 Puff Two Times A Day 3)  Zyrtec Allergy 10 Mg Tabs (Cetirizine Hcl) .... One Tablet By Mouth Once Daily 4)  Metformin Hcl 500 Mg Tabs (Metformin Hcl) .Marland Kitchen.. 1 Tablet By Mouth Two Times A Day 5)  Mucinex 600 Mg Xr12h-Tab (Guaifenesin) .Marland Kitchen.. 1 Tablet By Mouth Two Times A Day 6)  Calcium 600 1500 Mg Tabs (Calcium Carbonate) .... 2 Tablets By Mouth Once Daily 7)  Vitamin D 1000 Unit  Tabs (Cholecalciferol) .... One Tablet By Mouth Once Daily 8)  Fluconazole 100 Mg Tabs (Fluconazole) .... Take 1 P.o. Every Day X 7 Days. Start This Med. On 09/23/09 After Levaquin Finished 9)  Vitamin B-12 1000 Mcg Tabs (Cyanocobalamin) .... One Tablet By Mouth Once Daily  Allergies (verified): 1)  ! Pcn 2)  ! Morphine 3)  ! * Advair 4)  ! Sulfa 5)  ! * Spirvira 6)  ! 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 Hysterectomy 2007 cyst removal lt. wrist Extended right hemicolectomy 2008 Incisional hernia repair Colon Resection 2008  Family History: Family History of Uterine Cancer: 2 aunts and sister Family History of Diabetes: father and both grandfathers No FH of Colon Cancer:  Social History: Reviewed history from 08/04/2008 and no changes required. 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  Alcohol Use - no Daily Caffeine Use Illicit Drug Use - no Married  1 boy  Review of Systems       The patient complains of allergy/sinus, anxiety-new, arthritis/joint pain, back pain, change in vision, fatigue, muscle pains/cramps, pregnancy symptoms, and vision changes.  The  patient denies anemia, blood in urine, breast changes/lumps, confusion, cough, coughing up blood, depression-new, fainting, fever, headaches-new, hearing problems, heart murmur, heart rhythm changes, itching, menstrual pain, night sweats,  nosebleeds, shortness of breath, skin rash, sleeping problems, sore throat, swelling of feet/legs, swollen lymph glands, thirst - excessive , urination - excessive , urination changes/pain, urine leakage, and voice change.    Vital Signs:  Patient profile:   55 year old female Height:      61 inches Weight:      155 pounds BMI:     29.39 BSA:     1.70 Pulse rate:   88 / minute Pulse rhythm:   regular BP sitting:   120 / 68  (left arm) Cuff size:   regular  Vitals Entered By: Hope Pigeon CMA (October 20, 2009 1:33 PM)   Impression & Recommendations:  Problem # 1:  HELICOBACTER PYLORI INFECTION (ICD-041.86) Treated successfully as evidenced by followup stool that was negative for H. 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  Plan repeat dilatation p.r.n.  Patient Instructions: 1)  Copy sent to : Daiva Huge, PA 2)  You will need to have a follow up colonoscopy in 1 year 3)  The medication list was reviewed and reconciled.  All changed / newly prescribed medications were explained.  A complete medication list was provided to the patient / caregiver.

## 2010-03-16 NOTE — Letter (Signed)
Summary: [facility]   Imported By: Bubba Hales 07/15/2009 10:06:07  _____________________________________________________________________  External Attachment:    Type:   Image     Comment:   External Document

## 2010-03-16 NOTE — Procedures (Signed)
Summary: Flexible Sigmoidoscopy  Patient: Stephanie Carroll Note: All result statuses are Final unless otherwise noted.  Tests: (1) Flexible Sigmoidoscopy (FLX)  FLX Flexible Sigmoidoscopy                             DONE (C)     [facility].     [place], [place]  [address]           FLEXIBLE SIGMOIDOSCOPY PROCEDURE REPORT           PATIENT:  Stephanie Carroll, Stephanie Carroll  MR#:  [id number]     BIRTHDATE:  November 24, 1955, 65 yrs. old  GENDER:  female           ENDOSCOPIST:  Sandy Salaam. Deatra Ina, MD     Referred by:           PROCEDURE DATE:  08/20/2009     PROCEDURE:  Flexible Sigmoidoscopy, diagnostic     ASA CLASS:  Class II     INDICATIONS:  rectal bleeding, history of colon Ca           MEDICATIONS:   Fentanyl 50 mcg IV, Versed 7 mg IV           DESCRIPTION OF PROCEDURE:   After the risks benefits and     alternatives of the procedure were thoroughly explained, informed     consent was obtained.  Digital rectal exam was performed and     revealed no abnormalities.   The [id number]-[id number] endoscope     was introduced through the anus and advanced to the descending     colon, without limitations.  The quality of the prep was .  The     instrument was then slowly withdrawn as the mucosa was fully     examined.           The area of colon examined was normal in appearance.   Retroflexed     views in the rectum revealed no abnormalities.    The scope was     then withdrawn from the patient and the procedure terminated.           COMPLICATIONS:  None           ENDOSCOPIC IMPRESSION:     1) Normal colon           Limited rectal bleeding likely secondary to hemorrhoids           RECOMMENDATIONS:Anusol supp as needed           REPEAT EXAM:  No           ______________________________     Sandy Salaam. Deatra Ina, MD           CC:  Loel Lofty MD, Burney Gauze, MD           n.     REVISED:  08/27/2009 09:13 AM     eSIGNED:   Sandy Salaam. Bienvenido Proehl at 08/27/2009 09:13 AM        [place], [place], [phone]  Note: An exclamation mark (!) indicates a result that was not dispersed into the flowsheet. Document Creation Date: 08/27/2009 9:13 AM _______________________________________________________________________  (1) Order result status: Final Collection or observation date-time: 08/20/2009 14:26 Requested date-time:  Receipt date-time:  Reported date-time:  Referring Physician:   Ordering Physician: Erskine Emery [phone]) Specimen Source:  Source: Tawanna Cooler Order Number: [id number] Lab site:

## 2010-03-16 NOTE — Letter (Signed)
Summary: [place]   Imported By: Phillis Knack 01/14/2010 14:52:25  _____________________________________________________________________  External Attachment:    Type:   Image     Comment:   External Document

## 2010-03-16 NOTE — Assessment & Plan Note (Signed)
Summary: PAINFUL BM'S/RECTAL BLEEDING            (DR.KAPLAN PT.)      ...    History of Present Illness Visit Type: Follow-up Visit Primary GI MD: Erskine Emery MD [facility] Primary Provider: Daiva Huge, PA Chief Complaint: Monday pt started having intermitttant diarrhea with some rectal pain and bleeding on Tuesday. Pt states thet pain is getting worse with her BM's. History of Present Illness:   Patient followed by Dr. Deatra Ina for history of CRC and elevated LFTs.  Since colon resection patient has had semi-solid BMs 3-4 times a day. On Tuesday developed lower abdominal cramps, felt need to defecate, went to bathroom but felt "blocked". She then passed some loose bloody stool which was associated with rectal pain. Had a second episode thirty minutes later. Her third BM that day was normal and each BM since has been normal.   GI Review of Systems     Location of  Abdominal pain: lower abdomen.    Denies abdominal pain, acid reflux, belching, bloating, chest pain, dysphagia with liquids, dysphagia with solids, heartburn, loss of appetite, nausea, vomiting, vomiting blood, weight loss, and  weight gain.      Reports rectal bleeding and  rectal pain.     Denies anal fissure, black tarry stools, change in bowel habit, constipation, diarrhea, diverticulosis, fecal incontinence, heme positive stool, hemorrhoids, irritable bowel syndrome, jaundice, light color stool, and  liver problems.    Current Medications (verified): 1)  Aciphex 20 Mg Tbec (Rabeprazole Sodium) .Marland Kitchen.. 1 Tablet By Mouth Two Times A Day 2)  Asmanex 30 Metered Doses 220 Mcg/inh Aepb (Mometasone Furoate) .Marland Kitchen.. 1 Puff Two Times A Day 3)  Allegra 180 Mg Tabs (Fexofenadine Hcl) .Marland Kitchen.. 1 By Mouth Once Daily 4)  Metformin Hcl 500 Mg Tabs (Metformin Hcl) .Marland Kitchen.. 1 Tablet By Mouth Two Times A Day 5)  Mucinex 600 Mg Xr12h-Tab (Guaifenesin) .Marland Kitchen.. 1 Tablet By Mouth Two Times A Day 6)  Calcium 600 1500 Mg Tabs (Calcium Carbonate) .... 2 Tablets By  Mouth Once Daily 7)  Hyomax-Sl 0.125 Mg Subl (Hyoscyamine Sulfate) .... Take 2 Tabs Sublingual Q.4 H. P.r.n. 8)  Anucort-Hc 25 Mg Supp (Hydrocortisone Acetate) .... Put 1 in Rectum Every Night At Bedtime For 7 Nights. 9)  Vitamin D 1000 Unit Tabs (Cholecalciferol) .... One Tablet By Mouth Once Daily  Allergies (verified): 1)  ! Pcn 2)  ! Morphine 3)  ! * Advair 4)  ! 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 wrist Extended right hemicolectomy 2008 Incisional hernia repair  Family History: Reviewed history from 08/04/2008 and no changes required. Family History of Uterine Cancer: 2 aunts and sister Family History of Diabetes: father and both grandfathers  Social History: Reviewed history from 08/04/2008 and no changes required. 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  Alcohol Use - no Daily Caffeine Use Illicit Drug Use - no Married  1 boy  Review of Systems  The patient denies allergy/sinus, anemia, anxiety-new, arthritis/joint pain, back pain, blood in urine, breast changes/lumps, change in vision, confusion, cough, coughing up blood, depression-new, fainting, fatigue, fever, headaches-new, hearing problems, heart murmur, heart rhythm changes, itching, menstrual pain, muscle pains/cramps, night sweats, nosebleeds, pregnancy symptoms, shortness of breath, skin rash, sleeping problems, sore throat, swelling of feet/legs, swollen lymph glands, thirst -  excessive , urination - excessive , urination changes/pain, urine leakage, vision changes, and voice change.    Vital Signs:  Patient profile:   55 year old female Height:      61 inches Weight:      157 pounds BMI:     29.77 Pulse rate:   90 / minute Pulse rhythm:    regular BP sitting:   148 / 80  (left arm) Cuff size:   regular  Vitals Entered By: Marlon Pel CMA Deborra Medina) (Jun 26, 2009 2:27 PM)  Physical Exam  General:  Well developed, well nourished, no acute distress. Head:  Normocephalic and atraumatic. Eyes:  Conjunctiva pink, no icterus.  Mouth:  No oral lesions. Tongue moist.  Neck:  no obvious masses  Lungs:  Clear throughout to auscultation. Heart:  Regular rate and rhythm; no murmurs, rubs,  or bruits. Abdomen:  Abdomen soft, nontender, nondistended. No obvious masses or hepatomegaly.Normal bowel sounds.  Rectal:  No external hemorrhoids or fissure seen. On anoscopy there were some mildly inflamed internal hemorrhoids.  Msk:  Symmetrical with no gross deformities. Normal posture. Extremities:  No palmar erythema, no edema.  Neurologic:  Alert and  oriented x4;  grossly normal neurologically. Skin:  Intact without significant lesions or rashes. Cervical Nodes:  No significant cervical adenopathy. Psych:  Alert and cooperative. Normal mood and affect.   Impression & Recommendations:  Problem # 1:  RECTAL BLEEDING (ICD-569.3) Assessment New Three days ago had two episodes of rectal bleeding associated with painful defecation. No bleeding with defecation since. Mildly inflamed internal hemorrhoids on exam. Additionally, though no fissure seen on exam,  digital examination caused moderate discomfort suggesting fissure. Will treat with Anusol HC suppositories. Check blood counts. Patient will call if recurrent symptoms. Otherwise, follow up with Dr. Deatra Ina in 3-4 weeks.  Patient is s/p right hemicolectomy for CRC (2008) and is markedly upset about bleeding. Her surveillance colonoscopy due Aug. 2012.  Orders: TLB-CBC Platelet - w/Differential (85025-CBCD)  Problem # 2:  NONSPECIFIC ABNORMAL RESULTS LIVR FUNCTION STUDY (ICD-794.8) Assessment: Unchanged Genetic, autoimmune, viral hepatitis studies negative in past. Transaminitis has  been felt to be secondary to fatty liver disase identified on U/S. Her last LFTs 06/12/09 done at [facility] reveal normal bilirubin, normal alkaline phosphatase, AST 65 and ALT  82.   Problem # 3:  PERSONAL HX COLON CANCER (ICD-V10.05) Assessment: Comment Only Recent CEA 1.3.    Patient Instructions: 1)  Your physician has requested that you have the following labwork done today: Go to basement level. 2)  We made you a follow up appointment with Dr. Deatra Ina for 07-29-09 at 1:30 PM.  3)  Call us if your problems worsens or you have questions. 4)  Copy sent to : Daiva Huge, PA 5)  The medication list was reviewed and reconciled.  All changed / newly prescribed medications were explained.  A complete medication list was provided to the patient / caregiver.

## 2010-03-16 NOTE — Progress Notes (Signed)
Summary: TRIAGE   Phone Note From Pharmacy   Summary of Call: Per Pharmacist--Clarythromycin and Levaquin taken together can cause heart rhythm problems. Pt. advised to hold meds until further instructions from [place] upon his return next week.   DR.Ronn Smolinsky PLEASE SEE TRIAGE FROM 08-31-09 AND ADVISE  Initial call taken by: Vivia Ewing LPN,  September 04, 7987 2:11 PM  Follow-up for Phone Call        take levoquin after completing clarithromycin Follow-up by: Inda Castle MD,  September 07, 2009 11:57 AM  Additional Follow-up for Phone Call Additional follow up Details #1::        Message left for patient to callback. Vivia Ewing LPN  September 09, 9415 4:08 AM     Additional Follow-up for Phone Call Additional follow up Details #2::    Above MD orders reviewed with patient. Pt. to keep scheduled office visit 10-20-09 at 4pm. Pt. instructed to call back as needed.  Follow-up by: Vivia Ewing LPN,  September 09, 1446 1:85 PM

## 2010-03-16 NOTE — Assessment & Plan Note (Signed)
Summary: F/U Rectal Bleeding, saw NP    History of Present Illness Visit Type: Follow-up Visit Primary GI MD: Erskine Emery MD [facility] Primary Provider: Daiva Huge, PA Chief Complaint: Rectal bleeding has stopped; nausea, abdominal pain epigastric & RLQ History of Present Illness:   Stephanie Carroll has returned for followup of her rectal bleeding.  She has minimal rectal discomfort.  Bleeding has subsided.  She is now complaining of dysphagia to solids with some odynophagia and chest pain.  She has had pyrosis along with these symptoms.   GI Review of Systems    Reports abdominal pain, dysphagia with solids, and  nausea.     Location of  Abdominal pain: epigastric area.    Denies acid reflux, belching, bloating, chest pain, dysphagia with liquids, heartburn, loss of appetite, vomiting, vomiting blood, weight loss, and  weight gain.      Reports diarrhea.     Denies anal fissure, black tarry stools, change in bowel habit, constipation, diverticulosis, fecal incontinence, heme positive stool, hemorrhoids, irritable bowel syndrome, jaundice, light color stool, liver problems, rectal bleeding, and  rectal pain.    Current Medications (verified): 1)  Aciphex 20 Mg Tbec (Rabeprazole Sodium) .Marland Kitchen.. 1 Tablet By Mouth Two Times A Day 2)  Asmanex 30 Metered Doses 220 Mcg/inh Aepb (Mometasone Furoate) .Marland Kitchen.. 1 Puff Two Times A Day 3)  Allegra 180 Mg Tabs (Fexofenadine Hcl) .Marland Kitchen.. 1 By Mouth Once Daily 4)  Metformin Hcl 500 Mg Tabs (Metformin Hcl) .Marland Kitchen.. 1 Tablet By Mouth Two Times A Day 5)  Mucinex 600 Mg Xr12h-Tab (Guaifenesin) .Marland Kitchen.. 1 Tablet By Mouth Two Times A Day 6)  Calcium 600 1500 Mg Tabs (Calcium Carbonate) .... 2 Tablets By Mouth Once Daily 7)  Anucort-Hc 25 Mg Supp (Hydrocortisone Acetate) .... Put 1 in Rectum Every Night At Bedtime For 7 Nights. 8)  Vitamin D 1000 Unit Tabs (Cholecalciferol) .... One Tablet By Mouth Once Daily  Allergies (verified): 1)  ! Pcn 2)  ! Morphine 3)  ! * Advair 4)  !  Sulfa  Past History:  Past Medical History: Reviewed history from 06/26/2009 and no changes required. 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 wrist Extended right hemicolectomy 2008 Incisional hernia repair Colon Resection 2008  Family History: Reviewed history from 08/04/2008 and no changes required. Family History of Uterine Cancer: 2 aunts and sister Family History of Diabetes: father and both grandfathers  Social History: Reviewed history from 08/04/2008 and no changes required. 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  Alcohol Use - no Daily Caffeine Use Illicit Drug Use - no Married  1 boy  Review of Systems       The patient complains of allergy/sinus, anxiety-new, arthritis/joint pain, back pain, fatigue, shortness of breath, sleeping problems, and thirst - excessive.  The patient denies anemia, blood in urine, breast changes/lumps, change in vision, confusion, cough, coughing up blood, depression-new, fainting, fever, headaches-new, hearing problems, heart murmur, heart rhythm changes, itching, menstrual pain, muscle pains/cramps, night sweats, nosebleeds, pregnancy symptoms, skin rash, sore throat, swelling of feet/legs, thirst - excessive , urination - excessive , urination changes/pain, urine leakage, vision changes, and voice change.    Vital Signs:  Patient profile:   55 year old female Height:      61 inches Weight:      154.50 pounds BMI:     29.30  Pulse rate:   88 / minute Pulse rhythm:   regular BP sitting:   110 / 78  (left arm) Cuff size:   regular  Vitals Entered By: June McMurray [place] Deborra Medina) (July 29, 2009 1:36 PM)   Impression & Recommendations:  Problem # 1:  DYSPHAGIA UNSPECIFIED (ICD-787.20)  rule out early esophageal  stricture  Recommendations #1 upper endoscopy with dilatation as indicated #2 continue AcipHex  Risks, alternatives, and complications of the procedure, including bleeding, perforation, and possible need for surgery, were explained to the patient.  Patient's questions were answered.  Orders: EFL (Endo/Flex)  Problem # 2:  RECTAL BLEEDING (ICD-569.3)  This is most likely related to a anal fissure.  Recommendations #1 status post sigmoidoscopy to be done at the same time as her upper endoscopy  Orders: EFL (Endo/Flex)  Problem # 3:  PERSONAL HX COLON CANCER (ICD-V10.05) Plan followup colonoscopy in 2012  Problem # 4:  DM (ICD-250.00) Assessment: Comment Only  Patient Instructions: 1)  Copy sent to : Daiva Huge, PA 2)  Your EGD/Flex is scheduled for 08/20/2009 at 2pm 3)  Conscious Sedation brochure given.  4)  Upper Endoscopy with Dilatation brochure given.  5)  The medication list was reviewed and reconciled.  All changed / newly prescribed medications were explained.  A complete medication list was provided to the patient / caregiver.

## 2010-03-16 NOTE — Progress Notes (Signed)
Summary: TRIAGE-H-Pylori  Medications Added LEVAQUIN 500 MG TABS (LEVOFLOXACIN) TAKE 1 DAILY FOR 7 DAYS.       Phone Note Call from Patient Call back at [facility] Phone [phone]   Call For: Dr Deatra Ina Summary of Call: Problems w/Prevpac Initial call taken by: Irwin Brakeman [facility],  August 31, 2009 8:17 AM  Follow-up for Phone Call        Pt. began Prevpac on Saturday, by that evening she developed indigestion, chest pain, burping, epigastric pain, nausea. She  stopped it that night.  All symptoms resolved.   DR.BRODIE [facility] OF THE DAY) PLEASE ADVISE  Follow-up by: Vivia Ewing LPN,  September 01, 5623 63:89 AM  Additional Follow-up for Phone Call Additional follow up Details #1::        please switch to Prevacid 30 mg by mouth two times a day,,Tetracycline 500 mg by mouth qid, Flagyl 250 mg by mouth qid, and Peptobismol 2 tabs by mouth qid x 14 days. I think it is the Clarythromycin that made her sick. Additional Follow-up by: Lafayette Dragon MD,  August 31, 2009 1:21 PM    Additional Follow-up for Phone Call Additional follow up Details #2::    DR.BRODIE-She is allergic to Flagyl, Sulfa and PCN. Please advise. Follow-up by: Vivia Ewing LPN,  August 31, 3732 2:87 PM  Additional Follow-up for Phone Call Additional follow up Details #3:: Details for Additional Follow-up Action Taken: she will have to wait for DR Deatra Ina to come back to decide. Additional Follow-up by: Lafayette Dragon MD,  August 31, 2009 2:00 PM  New/Updated Medications: LEVAQUIN 500 MG TABS (LEVOFLOXACIN) TAKE 1 DAILY FOR 7 DAYS. Prescriptions: LEVAQUIN 500 MG TABS (LEVOFLOXACIN) TAKE 1 DAILY FOR 7 DAYS.  #7 x 0   Entered by:   Vivia Ewing LPN   Authorized by:   Inda Castle MD   Signed by:   Vivia Ewing LPN on 68/12/5724   Method used:   Electronically to        [facility] [phone]* (retail)       [place], [place]  [address]       Ph: [phone] or [phone]       Fax: [phone]   RxID:   [id number]   Above MD orders reviewed with patient. She declines to try Prevpac again, "I will just wait to see what he wants to do."  Pt. also wants to know how she will be tested to make sure the H-pylori is gone, she is concerned because she was treated for this in the past. DR.Yuko Coventry PLEASE ADVISE  clarithromycin 560m bid,  nexium 460mtwo times a day, levaquin 500 mg once daily, all for 7 days. f/u stool for h. pylori antigen in 2 weeks. RK  Above MD orders reviewed with patient. She will use the Clarythromycin and Protonix from the Prevpac and asked me to [place] the [place] stool tests [place] 2 weeks. Pt. instructed to call back as needed. DeVivia EwingPN  July 21, 209179:1:50M

## 2010-03-16 NOTE — Progress Notes (Signed)
   Phone Note Outgoing Call   Call placed by: Kairo Laubacher Call placed to: Patient Summary of Call: Called pt.'s cell phone with message left for return call. Call back received at [place]  pt. Informed pt. of need to call in rx for h-pylori. Prt. requesting rx to [place] at [facility]. All questions answered with pt. verbalizing understanding to pick up rx this afternoon. Initial call taken by: Margie Ege,  August 27, 2009 8:22 AM

## 2010-03-16 NOTE — Letter (Signed)
Summary: Results Letter  [facility]  [place], [place] [address]   Phone: [phone]  Fax: [phone]        August 25, 2009 MRN: [id number]    [place],   [address]    Dear Ms. NOBLES,  Your biopsies revealed  the presence of a bacteria called H. Pylori.  This is associated with recurrent inflammation of the stomach and duodenum, and recurrent ulcer disease.  My nurse will be calling in a prescription for treatment.            Sincerely,  Inda Castle MD  This letter has been electronically signed by your physician.  Appended Document: Results Letter letter mailed.

## 2010-03-16 NOTE — Letter (Signed)
Summary: [place] [facility]   Imported By: Phillis Knack 03/26/2009 12:00:53  _____________________________________________________________________  External Attachment:    Type:   Image     Comment:   External Document

## 2010-03-16 NOTE — Progress Notes (Signed)
Summary: TRIAGE-BLEEDING  Medications Added ANUCORT-HC 25 MG SUPP (HYDROCORTISONE ACETATE) Put 1 in rectum every night at bedtime for 7 nights.       Phone Note Call from Patient Call back at [phone]   Caller: Patient Call For: Dr. Deatra Ina Reason for Call: Talk to Nurse Summary of Call: pain when BM... bright red blood in last two BM's... pt does ot want to take next available appt and asked to be worked in tomorrow Initial call taken by: Lucien Mons,  Jun 25, 2009 8:05 AM  Follow-up for Phone Call        Hx. of Colon Cancer.  Pt. c/o 1 week of painful/burning rectum w/ BM's and blood in stools. Some loose stools. Denies constipation, fever,n/v. Wants an appt. tomorrow.   1) See Tye Savoy NP 06-26-09 at 2:30pm 2) Anusol HC supp. 1 pr qhs x 7 nights,sitz bath TID,Tucks pads to rectum TID,use baby wipes instead of toilet paper. 3) If symptoms become worse call back immediately or go to ER.  DR.BRODIE-DOC OF THE DAY--PLEASE REVIEW AND ADVISE  Follow-up by: Vivia Ewing LPN,  Jun 25, 6284 3:81 AM  Additional Follow-up for Phone Call Additional follow up Details #1::        reviewed and agree. Additional Follow-up by: Lafayette Dragon MD,  Jun 25, 2009 12:43 PM    New/Updated Medications: ANUCORT-HC 25 MG SUPP (HYDROCORTISONE ACETATE) Put 1 in rectum every night at bedtime for 7 nights. Prescriptions: ANUCORT-HC 25 MG SUPP (HYDROCORTISONE ACETATE) Put 1 in rectum every night at bedtime for 7 nights.  #7 x 1   Entered by:   Vivia Ewing LPN   Authorized by:   Inda Castle MD   Signed by:   Vivia Ewing LPN on 77/12/6577   Method used:   Electronically to        [facility] [phone]* (retail)       [place], [place]  [address]       Ph: [phone] or [phone]       Fax: [phone]   RxID:   [id number]

## 2010-03-16 NOTE — Letter (Signed)
Summary: [facility]  [place], [place] [address]   Phone: [phone]  Fax: [phone]       Stephanie Carroll    01-Apr-1955    MRN: [id number]        Procedure Day /Date:THURSDAY 08/20/2009     Arrival Time:1PM      Procedure Time:2PM     Location of Procedure:                    X   [facility] (4th Floor)   PREPARATION FOR FLEXIBLE SIGMOIDOSCOPY WITH MAGNESIUM CITRATE  Prior to the day before your procedure, purchase one 8 oz. bottle of Magnesium Citrate and one Fleet Enema from the laxative section of your drugstore.  _________________________________________________________________________________________________  THE DAY BEFORE YOUR PROCEDURE             DATE: 08/19/2009  DAY: WED  1.   Have a clear liquid dinner the night before your procedure.  2.   Do not drink anything colored red or purple.  Avoid juices with pulp.  No orange juice.              CLEAR LIQUIDS INCLUDE: Water Jello Ice Popsicles Tea (sugar ok, no milk/cream) Powdered fruit flavored drinks Coffee (sugar ok, no milk/cream) Gatorade Juice: apple, white grape, white cranberry  Lemonade Clear bullion, consomm, broth Carbonated beverages (any kind) Strained chicken noodle soup Hard Candy   3.   At 7:00 pm the night before your procedure, drink one bottle of Magnesium Citrate over ice.  4.   Drink at least 3 more glasses of clear liquids before bedtime (preferably juices).  5.   Results are expected usually within 1 to 6 hours after taking the Magnesium Citrate.  ___________________________________________________________________________________________________  THE DAY OF YOUR PROCEDURE            DATE: 08/20/2009   OIN:OMVEHMCN  1.   Use Fleet Enema one hour prior to coming for procedure.  2.   You may drink clear liquids until 12PM (2 hours before exam)       MEDICATION INSTRUCTIONS  Unless otherwise instructed, you should  take regular prescription medications with a small sip of water as early as possible the morning of your procedure.  Diabetic patients - see separate instructions.           OTHER INSTRUCTIONS  You will need a responsible adult at least 53 years of age to accompany you and drive you home.   This person must remain in the waiting room during your procedure.  Wear loose fitting clothing that is easily removed.  Leave jewelry and other valuables at home.  However, you may wish to bring a book to read or an iPod/MP3 player to listen to music as you wait for your procedure to start.  Remove all body piercing jewelry and leave at home.  Total time from sign-in until discharge is approximately 2-3 hours.  You should go home directly after your procedure and rest.  You can resume normal activities the day after your procedure.  The day of your procedure you should not:   Drive   Make legal decisions   Operate machinery   Drink alcohol   Return to work  You will receive specific instructions about eating, activities and medications before you leave.   The above instructions have been reviewed and explained to me by   _______________________    I fully understand and  can verbalize these instructions _____________________________ Date _________

## 2010-05-02 LAB — GLUCOSE, CAPILLARY: Glucose-Capillary: 106 mg/dL — ABNORMAL HIGH (ref 70–99)

## 2010-06-02 ENCOUNTER — Telehealth: Payer: Self-pay | Admitting: Gastroenterology

## 2010-06-02 NOTE — Telephone Encounter (Signed)
Left message for pt to call back  °

## 2010-06-02 NOTE — Telephone Encounter (Signed)
Pt states she would prefer to just do the colon without the banding. She is on vacation the week of May 28th-June1st. Let pt know that we would be in touch tomorrow to schedule the procedure.

## 2010-06-02 NOTE — Telephone Encounter (Signed)
It's okay to move up her colonoscopy. If she is having recurrent hemorrhoidal symptoms despite medicines. I would consider band ligation of her hemorrhoids at the same setting. If she is agreeable, then schedule her at the hospital

## 2010-06-02 NOTE — Telephone Encounter (Signed)
Pt states that she is due for colon in August. Pt would like to have this scheduled now due to what she thinks is hemorrhoids. Pt c/o frequent diarrhea and rectal swelling that she feels are hemorrhoids. She states that her rectum is at times painful. Dr. Deatra Ina please advise.

## 2010-06-03 NOTE — Telephone Encounter (Signed)
Pt scheduled for previsit for colon 07/05/10@10am , colon scheduled with Dr. Deatra Ina for 07/14/10@10am . Pt aware of appt dates and times.

## 2010-06-29 NOTE — Op Note (Signed)
NAMEKUSHI, KUN NO.:  [id number]   MEDICAL RECORD NO.:  [id number]          PATIENT TYPE:  INP   LOCATION:  X008                         FACILITY:  [facility]   PHYSICIAN:  Imogene Burn. Georgette Dover, M.D. DATE OF BIRTH:  Sep 18, 1955   DATE OF PROCEDURE:  11/09/2006  DATE OF DISCHARGE:                               OPERATIVE REPORT   PREOPERATIVE DIAGNOSIS:  Right colon cancer.   POSTOPERATIVE DIAGNOSIS:  Proximal transverse colon cancer.   PROCEDURE PERFORMED:  Extended right hemicolectomy with laparoscopic  hand assisted approach.   SURGEON:  Imogene Burn. Georgette Dover, M.D., FACS   </ASSISTANT  Doyne Keel, MD   ANESTHESIA:  General endotracheal.   INDICATIONS:  The patient is a 55 year old female who presented with  persistent positive fecal occult blood test.  She underwent a  colonoscopy October 12, 2006.  This showed a 4-cm mass.  This was felt to  be in the ascending colon near the hepatic flexure.  Biopsy proved this  to be adenocarcinoma.  A CT scan showed the primary lesion at the  hepatic flexure with one mildly enlarged lymph node but no sign of liver  metastases.  Her preoperative CEA level was 1.1.  The patient now  presents for colon resection.   DESCRIPTION OF PROCEDURE:  The patient brought to the operating room,  placed in supine position operating table.  After an adequate level of  general anesthesia was obtained, a Foley catheter placed under sterile  technique.  The patient's legs were placed in yellow fin stirrups in  lithotomy position.  Her abdomen was prepped with Betadine and draped in  sterile fashion.  Time-out was taken assure proper patient, proper  procedure.  A vertical incision was made extending about 1 cm in length  positioned 3 cm above the umbilicus.  The FIOS optical entry trocar was  used to cannulate the peritoneal cavity.  Insufflation was obtained by  insufflating CO2 maintaining maximal pressure of 15 mmHg.  The trocar  was  advanced all the way in to the peritoneal cavity.  We inspected the  abdominal cavity with the laparoscope.  There were some adhesions down  in the pelvis from a previous hysterectomy.  There were no signs of  metastatic lesions on the peritoneum or on the visualized portions the  liver.  A 7-cm vertical incision was made below the umbilicus.  The Gel  Port hand assist device was inserted and we reinsufflated.  Two 5 mL  ports were placed on each side at the level of the umbilicus.  We began  by mobilizing the omentum off the transverse colon.  We continued this  proximally around the hepatic flexure.  The cecum was then identified  and was mobilized medially.  We took down the lateral attachments and  rolled the descending colon medially.  The appendix was identified.  The  terminal ileum was also mobilized.  We continued our mobilization  superiorly around the hepatic flexure.  I could palpate the right kidney  posteriorly.  The duodenum was visualized and was bluntly dissected away  posteriorly.  We continued mobilizing the transverse colon medially and  inferiorly.  I could not palpate the lesion in the ascending colon.  We  continued mobilizing the omentum off the transverse colon.  We finally  were able localize the mass which was actually near the mid transverse  colon just proximal to the area of the middle colic artery.  We  continued mobilizing to the left distally on the transverse colon.  Once  the transverse colon was adequately mobilized we converted to open  portion the procedure.  Since we had to extend our resection around to  the distal transverse colon, I extended our incision superiorly  connecting the incision for the hand assist device to the upper camera  trocar site.  The Balfour retractor was inserted.  We mobilized the  cecum medially.  The terminal ileum was divided with the GIA 75 stapler.  The LigaSure device was used to take the mesentery of the ascending   colon.  The transverse colon was divided with a GIA 75 distal to the  middle colic artery.  The mesentery was taken with the LigaSure device.  The specimens passed off the field and opened on the back table.  We had  at least a 8 cm distal margin past the tumor.  This was sent for  pathologic examination.  We then identified the two stapled ends.  Hemostasis was felt to be good.  The ileocolic anastomosis was created  with an additional firing of the GIA 75 stapler.  The enterotomy was  closed with a TA-60 stapler.  The mesentery was closed with 2-0 silk  sutures.  A reinforcing crotch suture of 2-0 silk was also placed.  The  anastomosis was carefully inspected and no sign of leak was noted.  We  then irrigated the abdomen thoroughly with saline.  The mesentery was  placed over the anastomosis.  The fascia was closed with double-stranded  #1 PDS suture.  The subcutaneous tissues were irrigated.  Skin staples  were used to close skin.  The patient was extubated and brought to  recovery in stable condition.  All sponge, instrument, needle counts  correct.      Imogene Burn. Tsuei, M.D.  Electronically Signed     MKT/MEDQ  D:  11/09/2006  T:  11/10/2006  Job:  [id number]   cc:   Sandy Salaam. Deatra Ina, MD,FACG  [address]  [place] [address]

## 2010-06-29 NOTE — Letter (Signed)
November 02, 2006     RE:  VERDEAN, MURIN  MRN:  [id number]  /  DOB:  1955-12-07   The letter that I just dictated to [facility] is a stat dictation.    Sincerely,      Sandy Salaam. Deatra Ina, MD,FACG  Electronically Signed    RDK/MedQ  DD: 11/02/2006  DT: 11/03/2006  Job #: [id number]

## 2010-06-29 NOTE — Assessment & Plan Note (Signed)
[place]                         GASTROENTEROLOGY OFFICE NOTE   Stephanie Carroll, Stephanie Carroll                         MRN:          [id number]  DATE:08/28/2006                            DOB:          1955/06/22    PROBLEM:  Anemia.   Stephanie Carroll has returned for reevaluation.  Heme-positive stool was noted  by Dr. Kirk Ruths.  On August 01, 2006, hemoglobin was 10, MCV was 75, and RDW  17.  She has a history of hemorrhoids but has never seen frank rectal  bleeding.  It is noteworthy that in May 2006 her hemoglobin was 11.1 and  MCV was 79.3.  Stephanie Carroll has no GI complaints including abdominal pain,  change in bowel habits, melena or hematochezia.  She takes AcipHex twice  a day for her reflux which has been under excellent control.   Other medications include Mucinex, Flonase, Asmanex, and Allegra.  She  is allergic to PENICILLIN, FLAGYL, ADVAIR, and SPIRIVA.   PHYSICAL EXAMINATION:  VITAL SIGNS:  Pulse 64, blood pressure 124/72,  weight 164.  HEENT: EOMI. PERRLA. Sclerae are anicteric.  Conjunctivae are pink.  NECK:  Supple without thyromegaly, adenopathy or carotid bruits.  CHEST:  Clear to auscultation and percussion without adventitious  sounds.  CARDIAC:  Regular rhythm; normal S1 S2.  There are no murmurs, gallops  or rubs.  ABDOMEN:  Bowel sounds are normoactive.  Abdomen is soft, non-tender and  non-distended.  There are no abdominal masses, tenderness, splenic  enlargement or hepatomegaly.  EXTREMITIES:  Full range of motion.  No cyanosis, clubbing or edema.  RECTAL:  Deferred.   IMPRESSION:  Iron-deficiency anemia and hemoccult-positive stool.  Chronic GI bleeding source must be ruled out including polyps, AVMs, and  neoplasm.  It is doubtful that her hemorrhoidal bleeding would account  for her iron deficiency.   RECOMMENDATION:  Colonoscopy.  If negative, I would consider repeat  upper endoscopy (last endoscopy in April 2006).     Sandy Salaam.  Deatra Ina, MD,FACG  Electronically Signed    RDK/MedQ  DD: 08/28/2006  DT: 08/29/2006  Job #: [id number]   cc:   Toney Reil

## 2010-06-29 NOTE — Discharge Summary (Signed)
Stephanie, Carroll NO.:  [id number]   MEDICAL RECORD NO.:  [id number]          PATIENT TYPE:  INP   LOCATION:  [place]                         FACILITY:  [facility]   PHYSICIAN:  Imogene Burn. Georgette Dover, M.D. DATE OF BIRTH:  1955/04/04   DATE OF ADMISSION:  11/09/2006  DATE OF DISCHARGE:  11/13/2006                               DISCHARGE SUMMARY   ADMISSION DIAGNOSIS:  Colon cancer.   PROCEDURE:  An extended right hemicolectomy, laparoscopic assisted.   BRIEF HISTORY:  The patient is a 55 year old female who presented with  positive fecal occult blood tests.  She underwent a colonoscopy which  showed a 4 cm mass.  This was felt to be in the ascending colon of the  hepatic flexure.  Biopsy showed that this was adenocarcinoma.  CT scan  showed no metastatic disease.  Preoperative CEA level was normal at 1.1.   HOSPITAL COURSE:  The patient was admitted to the hospital after home  bowel prep.  On September 25, she underwent a laparoscopic hand assisted  extended right hemicolectomy.  The lesion was actually in the proximal  transverse colon.  We had to extend her incision to provide adequate  resection.  We performed staple side-to-side anastomosis.  The pathology  report is still pending.   Postoperatively, the patient has done extremely well.  She regained  bowel function by postop day #2.  She was advanced to regular diet on  postop day #3.  The wound looks good.  She is tolerating regular diet.  She has no nausea.  She is using only small amounts of p.r.n. pain  medication.   DISCHARGE INSTRUCTIONS:  Call on [phone] if there are any problems with  the wound such as redness, draining swelling.  Follow-up in one week for  staple removal.  She should refrain from any heavy lifting.  She may  shower over her incision.      Imogene Burn. Tsuei, M.D.  Electronically Signed     MKT/MEDQ  D:  11/13/2006  T:  11/13/2006  Job:  [id number]   cc:   Sandy Salaam. Deatra Ina, MD,FACG  [address]  [place] [address]

## 2010-06-29 NOTE — Op Note (Signed)
Stephanie Carroll, Stephanie Carroll NO.:  [id number]   MEDICAL RECORD NO.:  [id number]          PATIENT TYPE:  OIB   LOCATION:  0923                         FACILITY:  [facility]   PHYSICIAN:  Imogene Burn. Georgette Dover, M.D. DATE OF BIRTH:  March 12, 1955   DATE OF PROCEDURE:  12/19/2007  DATE OF DISCHARGE:                               OPERATIVE REPORT   PREOPERATIVE DIAGNOSIS:  Ventral incisional hernia.   POSTOPERATIVE DIAGNOSIS:  Ventral incisional hernia.   PROCEDURE PERFORMED:  Laparoscopic ventral hernia repair with mesh.   SURGEON:  Imogene Burn. Tsuei, M.D.   ANESTHESIA:  General.   INDICATIONS:  The patient is a 55 year old female who is status post a  laparoscopic assisted right hemicolectomy for colon cancer.  She is  doing well from cancer standpoint.  She has developed an abdominal wall  hernia protruding to the left of her incision.  This was confirmed on CT  scan.  This has become uncomfortable.  She presents now for laparoscopic  repair.   DESCRIPTION OF PROCEDURE:  The patient was brought to the operating  room, placed in supine position on the operating room table.  After an  adequate level of general anesthesia was obtained, a Foley catheter was  placed under sterile technique.  Her abdomen was prepped with Betadine  and draped in sterile fashion.  A time-out was taken to assure proper  patient, proper procedure.  We anesthetized an area below the left  costal margin with 0.25% Marcaine with epinephrine.  An 11 mm incision  was made.  The OptiVu trocar was used to cannulate the peritoneal  cavity.  We insufflated CO2 maintaining maximal pressure of 15 mmHg.  We  switched to a 5 mm 30 degrees laparoscope.  We inserted the scope and  encountered a lot of omental adhesions to the anterior abdominal wall.  These adhesions covered the entire length of the previous fascial  incision.  We placed two 5-mm ports on the left side in the anterior  axillary line.  We then used a  harmonic scalpel as well as blunt  dissection to take down all these omental adhesions including the  anterior abdominal wall.  Once the entire abdominal wall was cleared, we  carefully examined for defects.  In the midline just above the  umbilicus, there is a 1 cm very superficial defect.  About 3 cm below  the umbilicus protruding off the left, there is another 2 cm defect.  In  the lower midline there was another area of the fascial thinning, but no  obvious fascial defect.  The decision was made to cover all of these  with one sheet of mesh.  The spinal needle was used to mark the extent  of all of these fascial defects.  We then included some overlap and came  up with a 10 x 15 cm area.  We used a 10 x 15 sheet of Proceed mesh.  Six stay sutures of 0 Novofil were placed in the mesh.  This was then  rolled up and inserted in the peritoneal cavity and unfurled.  We used a  suture passer to pull up the 6 stay sutures through small stab  incisions.  This succeeded in deploying the mesh in a fairly taut  fashion.  The stay sutures were tied down.  A Protack device was then  used to placed circumferential tacks around the mesh.  Several random  tacks were placed inside this outer ring of tacks.  This was successful  in covering all the fascial defects with plenty of overlap.  We  inspected carefully for hemostasis.  We did have to place an additional  5 mm port on the right side to properly place the tacks.  We removed the  left upper quadrant 11 mm port and closed the fascia with the Endoclose  device and a 0 Vicryl suture.  All the ports were then removed as  pneumoperitoneum was released.  The port sites were all closed with  subcuticular layer of 4-0 Monocryl.  Dermabond was used to seal all the  skin incisions.  The patient was then extubated and brought to recovery  in stable condition.  All sponge, instrument, and needle counts correct.      Imogene Burn. Tsuei, M.D.   Electronically Signed     MKT/MEDQ  D:  12/19/2007  T:  12/20/2007  Job:  [id number]

## 2010-06-29 NOTE — Letter (Signed)
November 02, 2006    Ms. Langley Gauss. [facility], [place]   RE:  MALEA, SWILLING  MRN:  [id number]  /  DOB:  03-06-1955   Dear Ms. Gaynor,   As you know, your colonoscopy on October 12, 2006 revealed a mass in the  ascending (right side) colon.  As I discussed with you, the biopsies did  demonstrate malignant changes consistent with a colon cancer.  A follow-  up letter was sent to you that incorrectly indicated that no cancer was  found in the colon polyps.  This letter clearly was in error.   I apologize for any consternation that this caused you and your family,  and I will do everything that I can to determine how this error was made  and to correct this for the future.   I realize this is a very difficult time for you and that this error only  made matters more difficult.  Again, please accept my apologizes for our  error.  I do recommend that you undergo a follow-up colonoscopy in  approximately one year, and I am quite optimistic that you will have an  excellent result.    Sincerely,      Sandy Salaam. Deatra Ina, MD,FACG  Electronically Signed    RDK/MedQ  DD: 11/02/2006  DT: 11/02/2006  Job #: [id number]

## 2010-07-02 ENCOUNTER — Other Ambulatory Visit: Payer: Self-pay | Admitting: Family

## 2010-07-02 ENCOUNTER — Encounter (HOSPITAL_BASED_OUTPATIENT_CLINIC_OR_DEPARTMENT_OTHER): Payer: PRIVATE HEALTH INSURANCE | Admitting: Hematology & Oncology

## 2010-07-02 DIAGNOSIS — C187 Malignant neoplasm of sigmoid colon: Secondary | ICD-10-CM

## 2010-07-02 DIAGNOSIS — J069 Acute upper respiratory infection, unspecified: Secondary | ICD-10-CM

## 2010-07-02 LAB — COMPREHENSIVE METABOLIC PANEL
Alkaline Phosphatase: 88 U/L (ref 39–117)
BUN: 10 mg/dL (ref 6–23)
Creatinine, Ser: 0.62 mg/dL (ref 0.40–1.20)
Glucose, Bld: 108 mg/dL — ABNORMAL HIGH (ref 70–99)
Sodium: 139 mEq/L (ref 135–145)
Total Bilirubin: 0.3 mg/dL (ref 0.3–1.2)

## 2010-07-02 LAB — CBC WITH DIFFERENTIAL (CANCER CENTER ONLY)
BASO%: 0.9 % (ref 0.0–2.0)
LYMPH%: 31.7 % (ref 14.0–48.0)
MCV: 85 fL (ref 81–101)
MONO#: 0.6 10*3/uL (ref 0.1–0.9)
Platelets: 306 10*3/uL (ref 145–400)
RDW: 13.2 % (ref 11.1–15.7)
WBC: 9.4 10*3/uL (ref 3.9–10.0)

## 2010-07-05 ENCOUNTER — Encounter: Payer: Self-pay | Admitting: Gastroenterology

## 2010-07-05 ENCOUNTER — Ambulatory Visit (AMBULATORY_SURGERY_CENTER): Payer: PRIVATE HEALTH INSURANCE | Admitting: *Deleted

## 2010-07-05 VITALS — Ht 61.0 in | Wt 155.7 lb

## 2010-07-05 DIAGNOSIS — Z85038 Personal history of other malignant neoplasm of large intestine: Secondary | ICD-10-CM

## 2010-07-05 MED ORDER — PEG-KCL-NACL-NASULF-NA ASC-C 100 G PO SOLR: ORAL | Status: DC

## 2010-07-13 ENCOUNTER — Telehealth: Payer: Self-pay | Admitting: Gastroenterology

## 2010-07-13 ENCOUNTER — Encounter: Payer: Self-pay | Admitting: Gastroenterology

## 2010-07-13 DIAGNOSIS — Z1211 Encounter for screening for malignant neoplasm of colon: Secondary | ICD-10-CM

## 2010-07-13 MED ORDER — METOCLOPRAMIDE HCL 10 MG PO TABS: 10.0000 mg | ORAL_TABLET | ORAL | Status: AC

## 2010-07-13 MED ORDER — POLYETHYLENE GLYCOL 3350 17 GM/SCOOP PO POWD: 255.0000 g | Freq: Every day | ORAL | Status: DC

## 2010-07-13 NOTE — Telephone Encounter (Signed)
Error

## 2010-07-13 NOTE — Telephone Encounter (Signed)
Dr Deatra Ina, Mrs. Robenson has bought her moviprep but says she knows she can not drink it. She would like a different prep. She wants to do the miralax. Thanks

## 2010-07-13 NOTE — Telephone Encounter (Signed)
ok 

## 2010-07-13 NOTE — Telephone Encounter (Signed)
Prep for miralax sent to pharmacy.  Instructions for new prep explained to pt. Pt verbalizes understanding.

## 2010-07-14 ENCOUNTER — Encounter: Payer: Self-pay | Admitting: Gastroenterology

## 2010-07-14 ENCOUNTER — Ambulatory Visit (AMBULATORY_SURGERY_CENTER): Payer: PRIVATE HEALTH INSURANCE | Admitting: Gastroenterology

## 2010-07-14 DIAGNOSIS — K648 Other hemorrhoids: Secondary | ICD-10-CM

## 2010-07-14 DIAGNOSIS — Z1211 Encounter for screening for malignant neoplasm of colon: Secondary | ICD-10-CM

## 2010-07-14 DIAGNOSIS — Z85038 Personal history of other malignant neoplasm of large intestine: Secondary | ICD-10-CM

## 2010-07-14 LAB — GLUCOSE, CAPILLARY
Glucose-Capillary: 169 mg/dL — ABNORMAL HIGH (ref 70–99)
Glucose-Capillary: 156 mg/dL — ABNORMAL HIGH (ref 70–99)

## 2010-07-14 MED ORDER — SODIUM CHLORIDE 0.9 % IV SOLN: 500.0000 mL | INTRAVENOUS | Status: DC

## 2010-07-14 NOTE — Patient Instructions (Signed)
Follow discharge instructions.  Resume medications.  Next Colonoscopy in 5 years.

## 2010-07-15 ENCOUNTER — Telehealth: Payer: Self-pay

## 2010-07-15 NOTE — Telephone Encounter (Signed)

## 2010-07-27 ENCOUNTER — Encounter: Payer: Self-pay | Admitting: Gastroenterology

## 2010-11-15 LAB — DIFFERENTIAL
Basophils Relative: 1
Lymphocytes Relative: 22
Lymphs Abs: 2.4
Monocytes Relative: 5
Neutro Abs: 7.4
Neutrophils Relative %: 70

## 2010-11-15 LAB — BASIC METABOLIC PANEL
CO2: 27
Calcium: 10.2
Creatinine, Ser: 0.68
GFR calc Af Amer: >60
GFR calc non Af Amer: >60
Sodium: 139

## 2010-11-15 LAB — CBC
HCT: 45.5
Hemoglobin: 15.3 — ABNORMAL HIGH
MCHC: 33.6
MCV: 91
Platelets: 331
RBC: 5
RDW: 12.2
WBC: 10.6 — ABNORMAL HIGH

## 2010-11-16 LAB — GLUCOSE, CAPILLARY
Glucose-Capillary: 148 — ABNORMAL HIGH
Glucose-Capillary: 161 — ABNORMAL HIGH

## 2010-11-16 LAB — BASIC METABOLIC PANEL
CO2: 27
GFR calc non Af Amer: >60
Glucose, Bld: 158 — ABNORMAL HIGH
Potassium: 4.2
Sodium: 139

## 2010-11-16 LAB — CBC
HCT: 44.1
Hemoglobin: 14.9
RDW: 13.3

## 2010-11-16 LAB — DIFFERENTIAL
Basophils Absolute: 0.1
Eosinophils Relative: 2
Lymphocytes Relative: 28
Monocytes Absolute: 0.5

## 2010-11-25 LAB — BASIC METABOLIC PANEL
BUN: 3 — ABNORMAL LOW
BUN: 4 — ABNORMAL LOW
CO2: 24
CO2: 26
Calcium: 8.5
Chloride: 105
Chloride: 107
Creatinine, Ser: 0.67
Creatinine, Ser: 0.69
GFR calc Af Amer: >60
GFR calc Af Amer: >60
Potassium: 4.2

## 2010-11-25 LAB — COMPREHENSIVE METABOLIC PANEL
AST: 93 — ABNORMAL HIGH
Albumin: 3.8
Calcium: 9
Creatinine, Ser: 0.71
GFR calc Af Amer: >60

## 2010-11-25 LAB — CBC
HCT: 32.9 — ABNORMAL LOW
MCHC: 31.5
MCHC: 32.3
MCHC: 32.4
MCV: 72.8 — ABNORMAL LOW
MCV: 73 — ABNORMAL LOW
Platelets: 352
Platelets: 404 — ABNORMAL HIGH
RBC: 3.83 — ABNORMAL LOW
RBC: 4.52
RDW: 18.9 — ABNORMAL HIGH
RDW: 20 — ABNORMAL HIGH

## 2010-11-25 LAB — DIFFERENTIAL
Eosinophils Relative: 2
Lymphocytes Relative: 22
Lymphs Abs: 2.6
Monocytes Absolute: 0.8 — ABNORMAL HIGH
Neutro Abs: 8 — ABNORMAL HIGH

## 2010-11-25 LAB — CEA: CEA: 1.1

## 2011-01-14 ENCOUNTER — Other Ambulatory Visit: Payer: PRIVATE HEALTH INSURANCE | Admitting: Lab

## 2011-01-19 ENCOUNTER — Other Ambulatory Visit: Payer: Self-pay | Admitting: Family

## 2011-01-19 ENCOUNTER — Other Ambulatory Visit (HOSPITAL_BASED_OUTPATIENT_CLINIC_OR_DEPARTMENT_OTHER): Payer: PRIVATE HEALTH INSURANCE | Admitting: Lab

## 2011-01-19 ENCOUNTER — Encounter: Payer: Self-pay | Admitting: Family

## 2011-01-19 ENCOUNTER — Ambulatory Visit (HOSPITAL_BASED_OUTPATIENT_CLINIC_OR_DEPARTMENT_OTHER): Payer: PRIVATE HEALTH INSURANCE | Admitting: Family

## 2011-01-19 DIAGNOSIS — Z85038 Personal history of other malignant neoplasm of large intestine: Secondary | ICD-10-CM

## 2011-01-19 DIAGNOSIS — J069 Acute upper respiratory infection, unspecified: Secondary | ICD-10-CM

## 2011-01-19 DIAGNOSIS — C187 Malignant neoplasm of sigmoid colon: Secondary | ICD-10-CM

## 2011-01-19 DIAGNOSIS — E041 Nontoxic single thyroid nodule: Secondary | ICD-10-CM

## 2011-01-19 LAB — COMPREHENSIVE METABOLIC PANEL
ALT: 64 U/L — ABNORMAL HIGH (ref 0–35)
AST: 46 U/L — ABNORMAL HIGH (ref 0–37)
AST: 46 U/L — ABNORMAL HIGH (ref 0–37)
Albumin: 4.9 g/dL (ref 3.5–5.2)
Albumin: 4.9 g/dL (ref 3.5–5.2)
Alkaline Phosphatase: 93 U/L (ref 39–117)
Alkaline Phosphatase: 93 U/L (ref 39–117)
BUN: 14 mg/dL (ref 6–23)
BUN: 14 mg/dL (ref 6–23)
Calcium: 10.2 mg/dL (ref 8.4–10.5)
Calcium: 10.2 mg/dL (ref 8.4–10.5)
Chloride: 101 mEq/L (ref 96–112)
Chloride: 101 mEq/L (ref 96–112)
Creatinine, Ser: 0.66 mg/dL (ref 0.50–1.10)
Glucose, Bld: 75 mg/dL (ref 70–99)
Potassium: 4.2 mEq/L (ref 3.5–5.3)
Potassium: 4.2 mEq/L (ref 3.5–5.3)

## 2011-01-19 LAB — CBC WITH DIFFERENTIAL (CANCER CENTER ONLY)
BASO%: 0.4 % (ref 0.0–2.0)
Eosinophils Absolute: 0.3 10*3/uL (ref 0.0–0.5)
LYMPH#: 5.9 10*3/uL — ABNORMAL HIGH (ref 0.9–3.3)
MCV: 87 fL (ref 81–101)
MONO#: 1.2 10*3/uL — ABNORMAL HIGH (ref 0.1–0.9)
NEUT#: 10.1 10*3/uL — ABNORMAL HIGH (ref 1.5–6.5)
Platelets: 374 10*3/uL (ref 145–400)
RBC: 5.04 10*6/uL (ref 3.70–5.32)
RDW: 13.4 % (ref 11.1–15.7)
WBC: 17.5 10*3/uL — ABNORMAL HIGH (ref 3.9–10.0)

## 2011-01-19 LAB — T4: T4, Total: 10.8 ug/dL (ref 5.0–12.5)

## 2011-01-19 LAB — TSH: TSH: 1.763 u[IU]/mL (ref 0.350–4.500)

## 2011-01-19 NOTE — Progress Notes (Signed)
DIAGNOSIS: Patient Active Problem List  Diagnoses Date Noted  . HELICOBACTER PYLORI INFECTION 10/20/2009  . STRICTURE AND STENOSIS OF ESOPHAGUS 10/20/2009  . DYSPHAGIA UNSPECIFIED 07/29/2009  . RECTAL BLEEDING 06/26/2009  . ANAL OR RECTAL PAIN 06/26/2009  . DM 08/27/2008  . NONSPECIFIC ABNORMAL RESULTS LIVR FUNCTION STUDY 08/27/2008  . NAUSEA 08/04/2008  . ABDOMINAL PAIN-RUQ 08/04/2008  . PERSONAL HX COLON CANCER 08/04/2008  . GASTROESOPHAGEAL REFLUX DISEASE, CHRONIC 06/06/2007  . HEPATOMEGALY 06/06/2007  . MALIGNANT NEOPLASM OF ASCENDING COLON 10/12/2006     Encounter Diagnoses  Name Primary?  . Personal history of colon cancer   . Thyroid nodule     CURRENT THERAPY: Surveillance   INTERIM HISTORY:  Pt come in  for routine followup.  She was seen at Urgent Care over the weekend and diagnosed with a sinus infection and upper respiratory infection, given Biaxin, which she has taken for 3 days.   Reports 3-4 soft stools daily since surgery in 2008. No abdominal pain, change in bowel or bladder habits, blood in the stool. Recent cough with URI, none prior.  Last mammogram July, normal by her report.  Blood sugars poorly controlled, on Metformin. Has appointment with endocrinology Dec 17 to follow-up on thyroid nodule found on prior exam. Complains of fatigue, "has trouble getting out of bed in the morning".     PHYSICAL EXAM: BP 138/85  Pulse 110  Temp(Src) 96.6 F (35.9 C) (Oral)  Ht 5' 1.2" (1.554 m)  Wt 151 lb (68.493 kg)  BMI 28.34 kg/m2 General: Well developed, well nourished, in no acute distress.  EENT: No ocular or oral lesions. No stomatitis. No thyromegaly or discrete nodule. Nasal congestion.  Respiratory: Lungs are clear to auscultation bilaterally with normal respiratory movement and no accessory muscle use. Cardiac: No murmur, rub or tachycardia. No upper or lower extremity edema.  GI: Abdomen is soft, no palpable hepatosplenomegaly. No fluid wave. No  tenderness. Musculoskeletal: No kyphosis, no tenderness over the spine, ribs or hips. Lymph: No cervical, infraclavicular, axillary or inguinal adenopathy. Neuro: No focal neurological deficits. Psych: Alert and oriented X 3, appropriate mood and affect.   SOCIAL HISTORY:   . Marital Status: Single   Social History Main Topics  . Smoking status: Never Smoker   . Alcohol Use: No  . Drug Use: No    LABORATORY STUDIES:   Results for orders placed in visit on 01/19/11  CBC WITH DIFFERENTIAL ([facility])      Component Value Range   WBC 17.5 (*) 3.9 - 10.0 (10e3/uL)   RBC 5.04  3.70 - 5.32 (10e6/uL)   HGB 14.9  11.6 - 15.9 (g/dL)   HCT 43.6  34.8 - 46.6 (%)   MCV 87  81 - 101 (fL)   MCH 29.6  26.0 - 34.0 (pg)   MCHC 34.2  32.0 - 36.0 (g/dL)   RDW 13.4  11.1 - 15.7 (%)   Platelets 374  145 - 400 (10e3/uL)   NEUT# 10.1 (*) 1.5 - 6.5 (10e3/uL)   LYMPH# 5.9 (*) 0.9 - 3.3 (10e3/uL)   MONO# 1.2 (*) 0.1 - 0.9 (10e3/uL)   Eosinophils Absolute 0.3  0.0 - 0.5 (10e3/uL)   BASO# 0.1  0.0 - 0.2 (10e3/uL)   NEUT% 57.9  39.6 - 80.0 (%)   LYMPH% 33.5  14.0 - 48.0 (%)   MONO% 6.6  0.0 - 13.0 (%)   EOS% 1.6  0.0 - 7.0 (%)   BASO% 0.4  0.0 - 2.0 (%)  CEA 1.1  IMPRESSION: 55 yr old with: 1. Remote history of Stage IIA adenocarcinoma, sigmoid colon, 2008. No evidence of recurrence.  2. Upper respiratory infection, on Biaxin from Urgent Care. History of pneumonia July, 2012.  3. Normal mammogram, July 2012 by her report.    PLAN:   1. Check thyroid function. 2. Return to clinic in 6 months following NCCN guidelines.  3. She will follow-up with endocrinology in 1 week for thyroid nodule found on previous exam.

## 2011-01-21 ENCOUNTER — Other Ambulatory Visit: Payer: PRIVATE HEALTH INSURANCE | Admitting: Lab

## 2011-01-21 ENCOUNTER — Ambulatory Visit: Payer: PRIVATE HEALTH INSURANCE | Admitting: Family

## 2011-01-25 ENCOUNTER — Telehealth: Payer: Self-pay | Admitting: *Deleted

## 2011-01-25 NOTE — Telephone Encounter (Signed)
Per Virgina Organ, pt called and told her CEA and thyroid function was normal.  Pt voiced understanding.

## 2011-03-14 ENCOUNTER — Telehealth: Payer: Self-pay | Admitting: Gastroenterology

## 2011-03-14 MED ORDER — HYDROCORTISONE ACETATE 25 MG RE SUPP: 25.0000 mg | Freq: Two times a day (BID) | RECTAL | Status: DC | PRN

## 2011-03-14 NOTE — Telephone Encounter (Signed)
Med sent to pharmacy.

## 2011-03-14 NOTE — Telephone Encounter (Signed)
Dr Deatra Ina, This Patient wants a prescription of Anusol Supp.  She has not been seen in the office since 2011 that i can see  Can she have refills or does she need to be seen

## 2011-03-14 NOTE — Telephone Encounter (Signed)
yes

## 2011-03-24 ENCOUNTER — Telehealth: Payer: Self-pay | Admitting: Gastroenterology

## 2011-03-24 NOTE — Telephone Encounter (Signed)
Pt scheduled to see Nicoletta Ba PA tomorrow at 2pm. Caren Griffins to notify pt of appt date and time and fax records.

## 2011-03-25 ENCOUNTER — Encounter: Payer: Self-pay | Admitting: Physician Assistant

## 2011-03-25 ENCOUNTER — Ambulatory Visit (INDEPENDENT_AMBULATORY_CARE_PROVIDER_SITE_OTHER): Payer: PRIVATE HEALTH INSURANCE | Admitting: Physician Assistant

## 2011-03-25 DIAGNOSIS — R1310 Dysphagia, unspecified: Secondary | ICD-10-CM

## 2011-03-25 DIAGNOSIS — K209 Esophagitis, unspecified without bleeding: Secondary | ICD-10-CM

## 2011-03-25 DIAGNOSIS — K219 Gastro-esophageal reflux disease without esophagitis: Secondary | ICD-10-CM

## 2011-03-25 MED ORDER — SUCRALFATE 1 GM/10ML PO SUSP: ORAL | Status: DC

## 2011-03-25 NOTE — Progress Notes (Signed)
Subjective:    Patient ID: Stephanie Carroll, female    DOB: January 18, 1956, 56 y.o.   MRN: [id number]  HPI Stephanie Carroll is a 56 year old female known to Dr. Deatra Carroll who underwent upper endoscopy in July of 2011, he was found to have 3 small antral polyps and a distal esophageal stricture which was Stephanie Carroll dilated to 18 mm. This was felt to be an early stricture, biopsies were done which were positive for Stephanie Carroll did not have any intestinal metaplasia on esophageal biopsies. She also had colonoscopy done in August of 2009  , for followup of colorectal cancer. She is status post right hemicolectomy in 2008 and had a normal exam with the exception of a 12 mm sessile polyp which was a tubular adenoma. . She is due for followup  Patient comes in today with complaints of esophageal pain and dysphagia. She has been on AcipHex long-term twice-daily and says that she has had some intermittent difficulty swallowing solid food for a while. She had taken a course of antibiotics within the past 2 weeks which was Keflex for an ear infection and says that last weekend she had an irritated raw feeling in her throat and then had some sharp pains with swallowing. She also reports that she has a benign thyroid tumor which is being followed by an endocrinologist in [place] that she wonders if that has gotten a bit larger because sometimes it feels tender. She put heat over this area overall on the weekend and that seemed to help. She says over the past couple of days her esophagus actually feels better she can swallow liquids and soft foods without any difficulty or any discomfort been avoiding some solids . She has  complaints of abdominal pain, intermittently on her right abdomen primarily right upper quadrant radiating around in her back. This is been present off and on for months. She reports that she is actually scheduled for an upper abdominal ultrasound next week also in [place] which was arranged by her primary care  doctor. She is following up with her endocrinologist at the beginning of March.   Review of Systems  Constitutional: Negative.   HENT: Positive for trouble swallowing.   Eyes: Negative.   Respiratory: Negative.   Cardiovascular: Negative.   Gastrointestinal: Positive for abdominal pain.  Genitourinary: Negative.   Musculoskeletal: Negative.   Skin: Negative.   Neurological: Negative.   Hematological: Negative.   Psychiatric/Behavioral: Negative.    Outpatient Prescriptions Prior to Visit  Medication Sig Dispense Refill  . cetirizine (ZYRTEC ALLERGY) 10 MG tablet Take 10 mg by mouth daily.        . cholecalciferol (VITAMIN D) 1000 UNITS tablet Take 2,000 Units by mouth daily.       . Cyanocobalamin (VITAMIN B-12) 500 MCG SUBL Place 500 mcg under the tongue daily.        . GuaiFENesin (MUCINEX PO) Take 400 mg by mouth 2 (two) times daily.        . hydrocortisone (ANUSOL-HC) 25 MG suppository Place 1 suppository (25 mg total) rectally 2 (two) times daily as needed for hemorrhoids.  12 suppository  1  . LORazepam (ATIVAN) 0.5 MG tablet Take 1 tablet by mouth as needed.      . metFORMIN (GLUCOPHAGE) 500 MG tablet Take 500 mg by mouth 3 (three) times daily with meals.        . mometasone (ASMANEX) 220 MCG/INH inhaler Inhale 1 puff into the lungs 2 (two) times daily.        Marland Kitchen  RABEprazole (ACIPHEX) 20 MG tablet Take 20 mg by mouth 2 (two) times daily.       . Omega-3 Fatty Acids (FISH OIL) 1000 MG CAPS Take 1 capsule by mouth 3 (three) times a week.             Allergies  Allergen Reactions  . Advair Hfa   . Metronidazole   . Morphine     REACTION: heart racing  . Penicillins     REACTION: rash  . Sulfonamide Derivatives    Active Ambulatory Problems    Diagnosis Date Noted  . HELICOBACTER Carroll INFECTION 10/20/2009  . MALIGNANT NEOPLASM OF ASCENDING COLON 10/12/2006  . DM 08/27/2008  . STRICTURE AND STENOSIS OF ESOPHAGUS 10/20/2009  . GASTROESOPHAGEAL REFLUX DISEASE, CHRONIC  06/06/2007  . RECTAL BLEEDING 06/26/2009  . ANAL OR RECTAL PAIN 06/26/2009  . NAUSEA 08/04/2008  . DYSPHAGIA UNSPECIFIED 07/29/2009  . ABDOMINAL PAIN-RUQ 08/04/2008  . HEPATOMEGALY 06/06/2007  . NONSPECIFIC ABNORMAL RESULTS LIVR FUNCTION STUDY 08/27/2008  . PERSONAL HX COLON CANCER 08/04/2008   Resolved Ambulatory Problems    Diagnosis Date Noted  . No Resolved Ambulatory Problems   Past Medical History  Diagnosis Date  . Colon cancer 2008  . Asthma   . Diabetes mellitus   . GERD (gastroesophageal reflux disease)   . Arthritis   . Thyroid tumor, benign     Objective:   Physical Exam well-developed white female in no acute distress, pleasant blood pressure 124/70 pulse 84. HEENT; no thrush evident, nontraumatic normocephalic, EOM,I PERRLA, sclera anicteric,Neck; Supple she does have fullness anteriorly, I cannot palpate a thyroid mass she is slightly tender with pressure over her thyroid. Cardiovascular; regular rate and rhythm with S1-S2 no murmur gallop, Pulmonary; clear bilaterally, Abdomen; soft, ,nontender bowel ,sounds are active she has a midline incisional scar, liver is palpable 2 fingerbreadths below the right costal margin nontender. Rectal; not done. Extremities; no clubbing cyanosis or edema. Psych; mood and affect normal and appropriate.        Assessment & Plan:  #90 56 year old female with history of chronic GERD and previously dilated early esophageal stricture with complaints of dysphagia and odynophagia over the past week currently improved. I suspect she may have developed some esophagitis after taking antibiotics which is now resolving. She does have some longer-term intermittent solid food dysphagia and may benefit from a repeat dilation. She also has a known thyroid lesion and she feels that she gets some discomfort in her throat at times from this.  Plan; we discussed repeat upper endoscopy with dilation, as she is feeling better over the past few days she  would like to wait until after she sees her endocrinologist about her thyroid. We'll start Carafate liquid 1 g 4 times daily between meals and at bedtime x10 days Continue AcipHex 20 mg twice daily Advised patient should her swallowing worsened again to call back and we will arrange for EGD with dilation with Dr.Kaplan. Personal history of colon cancer status post right hemicolectomy 2008, she is due for followup colonoscopy. I realize that this after reviewing her reviews colonoscopy records, we will call her and get her scheduled for followup. If she needs an endoscopy this can be done at the same time.

## 2011-03-25 NOTE — Patient Instructions (Signed)
Continue the Aciphex. We have sent the prescription for the Carafate to [facility], [place].  Call back if swallowing difficulty worsens. We have given you a letter to take to the imaging office in [facility] asking them To fax [place] the results of the Ultrasound.

## 2011-03-26 NOTE — Progress Notes (Signed)
I would add Diflucan 130m, #3, 1 po qd, just in case she had Candida esophagitis

## 2011-03-29 ENCOUNTER — Telehealth: Payer: Self-pay

## 2011-03-29 NOTE — Telephone Encounter (Signed)
Spoke with pt and reviewed ultrasound results per Nicoletta Ba PA. Pt states she has not been having the sharp pains like she was, reports that she will call us back if she does to schedule an egd. Pt thinks it could be her thyroid and has an appt scheduled with her endocrinologist.

## 2011-03-31 ENCOUNTER — Telehealth: Payer: Self-pay | Admitting: Gastroenterology

## 2011-03-31 NOTE — Telephone Encounter (Signed)
Patient was calling back to check on her ultrasound results. Per Nicoletta Ba PA ultrasound looked fine except for some fatty changes in her liver. Pt aware.

## 2011-03-31 NOTE — Telephone Encounter (Signed)
Left message for pt to call back  °

## 2011-07-22 ENCOUNTER — Other Ambulatory Visit: Payer: PRIVATE HEALTH INSURANCE | Admitting: Lab

## 2011-07-22 ENCOUNTER — Ambulatory Visit: Payer: PRIVATE HEALTH INSURANCE | Admitting: Hematology & Oncology

## 2011-07-26 ENCOUNTER — Ambulatory Visit: Payer: PRIVATE HEALTH INSURANCE | Admitting: Hematology & Oncology

## 2011-07-26 ENCOUNTER — Other Ambulatory Visit: Payer: PRIVATE HEALTH INSURANCE | Admitting: Lab

## 2011-07-29 ENCOUNTER — Other Ambulatory Visit (HOSPITAL_BASED_OUTPATIENT_CLINIC_OR_DEPARTMENT_OTHER): Payer: PRIVATE HEALTH INSURANCE | Admitting: Lab

## 2011-07-29 ENCOUNTER — Ambulatory Visit (HOSPITAL_BASED_OUTPATIENT_CLINIC_OR_DEPARTMENT_OTHER): Payer: PRIVATE HEALTH INSURANCE | Admitting: Hematology & Oncology

## 2011-07-29 VITALS — BP 140/86 | HR 90 | Temp 97.2°F | Ht 61.0 in | Wt 151.0 lb

## 2011-07-29 DIAGNOSIS — C187 Malignant neoplasm of sigmoid colon: Secondary | ICD-10-CM

## 2011-07-29 DIAGNOSIS — C189 Malignant neoplasm of colon, unspecified: Secondary | ICD-10-CM

## 2011-07-29 DIAGNOSIS — E119 Type 2 diabetes mellitus without complications: Secondary | ICD-10-CM

## 2011-07-29 LAB — COMPREHENSIVE METABOLIC PANEL
AST: 39 U/L — ABNORMAL HIGH (ref 0–37)
Albumin: 4.6 g/dL (ref 3.5–5.2)
Alkaline Phosphatase: 90 U/L (ref 39–117)
BUN: 8 mg/dL (ref 6–23)
Creatinine, Ser: 0.65 mg/dL (ref 0.50–1.10)
Potassium: 4.1 mEq/L (ref 3.5–5.3)
Total Bilirubin: 0.3 mg/dL (ref 0.3–1.2)

## 2011-07-29 LAB — CBC WITH DIFFERENTIAL (CANCER CENTER ONLY)
BASO#: 0.1 10*3/uL (ref 0.0–0.2)
Eosinophils Absolute: 0.2 10*3/uL (ref 0.0–0.5)
HCT: 39.7 % (ref 34.8–46.6)
HGB: 13.4 g/dL (ref 11.6–15.9)
LYMPH#: 3 10*3/uL (ref 0.9–3.3)
NEUT#: 5.5 10*3/uL (ref 1.5–6.5)
NEUT%: 59.4 % (ref 39.6–80.0)
RBC: 4.65 10*6/uL (ref 3.70–5.32)

## 2011-07-29 NOTE — Progress Notes (Signed)
This office note has been dictated.

## 2011-07-29 NOTE — Progress Notes (Signed)
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  CURRENT THERAPY:  Observation.  INTERIM HISTORY:  Stephanie Carroll comes in for followup.  We see her every 6 months.  She is doing okay from a cancer standpoint.  She now is out 5 years from surgery.  Her bigger problem is the diabetes.  She is not on insulin as of yet. She said her last hemoglobin A1c was 7.  She has had no problems with fatigue or weakness.  She is still working. She has had no nausea or vomiting.  She has had no cough.  There has been no weight loss or weight gain.  She has not noticed any problems fever wise.  Her last CEA back in December was 1.1.  PHYSICAL EXAM:  This is a petite white female in no obvious distress. Vital Signs:  97.2, pulse 90, respiratory rate 20, blood pressure 140/86.  Weight is 151.  Head and neck:  Shows a normocephalic, atraumatic skull.  There are no ocular or oral lesions.  There are no palpable cervical or supraclavicular lymph nodes.  Lungs:  Clear to percussion and auscultation bilaterally.  Cardiac:  Regular rate and rhythm with a normal S1 and S2.  There are no murmurs, rubs or bruits. Abdomen:  Soft with good bowel sounds.  There is no palpable abdominal mass.  There is no fluid wave.  There is no palpable hepatosplenomegaly. She has well-healed laparotomy scar.  Back:  No tenderness over the spine, ribs, or hips.  Extremities:  No clubbing, cyanosis or edema. Neurological:  No focal neurological deficits.  LABORATORY STUDIES:  White cell count is 9.3, hemoglobin 13.4, hematocrit 39.7, platelet count 308.  IMPRESSION:  [name] is a 56 year old white female with history of stage II adenocarcinoma of the sigmoid colon.  She underwent resection back in 2008.  Again, I do not see any evidence of recurrent disease.  We are checking her CEA every 6 months when we see her.  We will continue to see her back in 6 months.  I told her that her diabetes will be much  more of an issue than recurrent colon cancer.  She is very motivated to keep her diabetes under good control.    ______________________________ Volanda Napoleon, M.D. PRE/MEDQ  D:  07/29/2011  T:  07/29/2011  Job:  [id number]

## 2011-08-03 ENCOUNTER — Telehealth: Payer: Self-pay | Admitting: Hematology & Oncology

## 2011-08-03 NOTE — Telephone Encounter (Addendum)
Message copied by Trevor Mace on Wed Aug 03, 2011  1:14 PM ------      Message from: Volanda Napoleon      Created: Sun Jul 31, 2011  9:31 PM       Call - labs look ok.  Pete  08-03-11 1:15 pm  Called and spoke to pt regarding the results of lab results, per MD.

## 2011-10-14 ENCOUNTER — Encounter (HOSPITAL_COMMUNITY): Payer: Self-pay | Admitting: Emergency Medicine

## 2011-10-14 ENCOUNTER — Emergency Department (HOSPITAL_COMMUNITY)
Admission: EM | Admit: 2011-10-14 | Discharge: 2011-10-15 | Disposition: A | Discharge status: Home / Self Care | Payer: PRIVATE HEALTH INSURANCE | Attending: Emergency Medicine | Admitting: Emergency Medicine

## 2011-10-14 ENCOUNTER — Emergency Department (HOSPITAL_COMMUNITY): Payer: PRIVATE HEALTH INSURANCE

## 2011-10-14 DIAGNOSIS — R209 Unspecified disturbances of skin sensation: Secondary | ICD-10-CM | POA: Insufficient documentation

## 2011-10-14 DIAGNOSIS — E119 Type 2 diabetes mellitus without complications: Secondary | ICD-10-CM | POA: Insufficient documentation

## 2011-10-14 DIAGNOSIS — Z79899 Other long term (current) drug therapy: Secondary | ICD-10-CM | POA: Insufficient documentation

## 2011-10-14 DIAGNOSIS — R200 Anesthesia of skin: Secondary | ICD-10-CM

## 2011-10-14 LAB — POCT I-STAT, CHEM 8
BUN: 8 mg/dL (ref 6–23)
Calcium, Ion: 1.18 mmol/L (ref 1.12–1.23)
Chloride: 102 mEq/L (ref 96–112)
Creatinine, Ser: 0.6 mg/dL (ref 0.50–1.10)
TCO2: 25 mmol/L (ref 0–100)

## 2011-10-14 NOTE — ED Notes (Signed)
Updated pt on care.

## 2011-10-14 NOTE — ED Notes (Signed)
Pt gone to CT at this time.

## 2011-10-14 NOTE — ED Provider Notes (Signed)
History     CSN: [id number]  Arrival date & time 10/14/11  4098   First MD Initiated Contact with Patient 10/14/11 2159      Chief Complaint  Patient presents with  . Numbness    (Consider location/radiation/quality/duration/timing/severity/associated sxs/prior treatment) The history is provided by the patient and the spouse. No language interpreter was used.  cc:  56 year old patient here today with complaint of right facial numbness 5 days ago. Patient states that today that she had twitching to her right face and now she feels a swollen spot. Patient has no  neurological complaints no headaches no weakness, no memory problems,  no blurred vision.  Patient is here to see if she's had a stroke. Patient has a past medical history of colon cancer and she has been cancer free 5 years. She also has a past medical history of asthma diabetes, GERD, arthritis, and thyroid tumor that was benign. Non smoker.  Past Medical History  Diagnosis Date  . Colon cancer 2008  . Asthma   . Diabetes mellitus   . GERD (gastroesophageal reflux disease)   . Arthritis     DDD  . Thyroid tumor, benign     Past Surgical History  Procedure Date  . Colon surgery   . Partial hysterectomy   . Incisional hernia repair   . Nasal septum surgery   . Cesarean section     x 1  . Wrist ganglion excision     left  . Polypectomy   . Upper gastrointestinal endoscopy   . Biopsy thyroid     X2    Family History  Problem Relation Age of Onset  . Colon cancer Neg Hx   . COPD      both sides  . Diabetes      both sides  . Heart disease Father   . Lung cancer Paternal Uncle   . Leukemia Paternal Grandfather   . Cervical cancer Paternal Aunt     x 2   . Heart defect Mother     MVP    History  Substance Use Topics  . Smoking status: Never Smoker   . Smokeless tobacco: Never Used  . Alcohol Use: No    OB History    Grav Para Term Preterm Abortions TAB SAB Ect Mult Living                   Review of Systems  Constitutional: Negative.  Negative for fever and diaphoresis.  HENT: Negative.  Negative for congestion and sinus pressure.   Eyes: Negative.  Negative for photophobia, pain and visual disturbance.  Respiratory: Negative.   Cardiovascular: Negative.  Negative for chest pain, palpitations and leg swelling.  Gastrointestinal: Negative.  Negative for nausea, vomiting and abdominal distention.  Musculoskeletal: Negative for back pain and gait problem.  Skin: Negative.   Neurological: Positive for numbness. Negative for dizziness, seizures, facial asymmetry, speech difficulty, weakness, light-headedness and headaches.  Psychiatric/Behavioral: Negative.  Negative for behavioral problems and agitation.  All other systems reviewed and are negative.    Allergies  Fluticasone-salmeterol; Metronidazole; Morphine; Other; Peanuts; Penicillins; and Sulfonamide derivatives  Home Medications   Current Outpatient Rx  Name Route Sig Dispense Refill  . ALBUTEROL SULFATE HFA 108 (90 BASE) MCG/ACT IN AERS Inhalation Inhale 1 puff into the lungs every 6 (six) hours as needed. For shortness for breath    . CETIRIZINE HCL 10 MG PO TABS Oral Take 10 mg by mouth daily.      Marland Kitchen  VITAMIN D 1000 UNITS PO TABS Oral Take 1,000 Units by mouth daily.     Marland Kitchen VITAMIN B-12 500 MCG SL SUBL Sublingual Place 500 mcg under the tongue daily.      . GUAIFENESIN 200 MG PO TABS Oral Take 400 mg by mouth 2 (two) times daily.    Marland Kitchen LORAZEPAM 0.5 MG PO TABS Oral Take 0.5 mg by mouth every 8 (eight) hours as needed. For anxiety    . METFORMIN HCL 500 MG PO TABS Oral Take 500 mg by mouth 3 (three) times daily with meals.      . MOMETASONE FUROATE 220 MCG/INH IN AEPB Inhalation Inhale 1 puff into the lungs 2 (two) times daily.      Marland Kitchen RABEPRAZOLE SODIUM 20 MG PO TBEC Oral Take 20 mg by mouth 2 (two) times daily.       BP 144/80  Pulse 112  Temp 97.9 F (36.6 C) (Oral)  Resp 18  SpO2 97%  Physical Exam   Nursing note and vitals reviewed. Constitutional: She is oriented to person, place, and time. She appears well-developed and well-nourished.  HENT:  Head: Normocephalic and atraumatic.  Eyes: Conjunctivae and EOM are normal. Pupils are equal, round, and reactive to light.  Neck: Normal range of motion. Neck supple.  Cardiovascular: Normal rate.   Pulmonary/Chest: Effort normal.  Abdominal: Soft.  Musculoskeletal: Normal range of motion. She exhibits no edema and no tenderness.  Neurological: She is alert and oriented to person, place, and time. She has normal reflexes. No cranial nerve deficit. Coordination normal.  Skin: Skin is warm and dry.  Psychiatric: She has a normal mood and affect.    ED Course  Procedures (including critical care time)   Labs Reviewed  CBC   No results found.   No diagnosis found.    MDM  R facial numbness 1 week ago with "twitching:" and lump to her R face today.  CT head negative for stroke.  Labs unremarkable.  EKg ST initially with anxiety.  Now hr 89.  Will return if and neuro symptoms return.  Follow up with pcp on Tuesday.  Date: 10/15/2011  Rate: 113  Rhythm: normal sinus rhythm  QRS Axis: normal  Intervals: normal  ST/T Wave abnormalities: normal  Conduction Disutrbances:none  Narrative Interpretation:   Old EKG Reviewed: unchanged  Labs Reviewed  CBC - Abnormal; Notable for the following:    WBC 11.6 (*)     All other components within normal limits  POCT I-STAT, CHEM 8 - Abnormal; Notable for the following:    Glucose, Bld 161 (*)     All other components within normal limits  LAB REPORT - SCANNED           Julieta Bellini, NP 10/15/11 1216

## 2011-10-14 NOTE — ED Notes (Signed)
Pt reports, Sunday she started to feel like her face was tingling to right side of face; reports that started to have numbness to R face today with the tingling sensation about 2 hours ago, reports feels knot in R jaw; no slurred speech; face symmetrical, grips equal, no arm drift

## 2011-10-15 LAB — CBC
Hemoglobin: 14 g/dL (ref 12.0–15.0)
MCH: 28.5 pg (ref 26.0–34.0)
MCV: 84.3 fL (ref 78.0–100.0)
Platelets: 310 10*3/uL (ref 150–400)
RBC: 4.92 MIL/uL (ref 3.87–5.11)
WBC: 11.6 10*3/uL — ABNORMAL HIGH (ref 4.0–10.5)

## 2011-10-15 NOTE — ED Notes (Signed)
Pt returned from CT.  Continues with  numbness to right face.  Denies tingling at this time.  Awaiting CT results

## 2011-10-15 NOTE — ED Provider Notes (Signed)
Medical screening examination/treatment/procedure(s) were performed by non-physician practitioner and as supervising physician I was immediately available for consultation/collaboration.  Perlie Mayo, MD 10/15/11 [phone]

## 2011-12-01 ENCOUNTER — Ambulatory Visit: Payer: PRIVATE HEALTH INSURANCE | Admitting: Physical Therapy

## 2012-01-27 ENCOUNTER — Other Ambulatory Visit (HOSPITAL_BASED_OUTPATIENT_CLINIC_OR_DEPARTMENT_OTHER): Payer: PRIVATE HEALTH INSURANCE | Admitting: Lab

## 2012-01-27 ENCOUNTER — Ambulatory Visit (HOSPITAL_BASED_OUTPATIENT_CLINIC_OR_DEPARTMENT_OTHER): Payer: PRIVATE HEALTH INSURANCE | Admitting: Hematology & Oncology

## 2012-01-27 VITALS — BP 140/81 | HR 98 | Temp 97.9°F | Resp 16 | Ht 61.0 in | Wt 149.0 lb

## 2012-01-27 DIAGNOSIS — C189 Malignant neoplasm of colon, unspecified: Secondary | ICD-10-CM

## 2012-01-27 DIAGNOSIS — C182 Malignant neoplasm of ascending colon: Secondary | ICD-10-CM

## 2012-01-27 DIAGNOSIS — E119 Type 2 diabetes mellitus without complications: Secondary | ICD-10-CM

## 2012-01-27 DIAGNOSIS — C187 Malignant neoplasm of sigmoid colon: Secondary | ICD-10-CM

## 2012-01-27 LAB — CBC WITH DIFFERENTIAL (CANCER CENTER ONLY)
Eosinophils Absolute: 0.1 10*3/uL (ref 0.0–0.5)
HCT: 42.1 % (ref 34.8–46.6)
LYMPH%: 20 % (ref 14.0–48.0)
MCH: 28.9 pg (ref 26.0–34.0)
MCV: 86 fL (ref 81–101)
MONO#: 0.7 10*3/uL (ref 0.1–0.9)
MONO%: 5.9 % (ref 0.0–13.0)
NEUT%: 73.1 % (ref 39.6–80.0)
Platelets: 304 10*3/uL (ref 145–400)
RBC: 4.92 10*6/uL (ref 3.70–5.32)
WBC: 11 10*3/uL — ABNORMAL HIGH (ref 3.9–10.0)

## 2012-01-27 NOTE — Progress Notes (Signed)
This office note has been dictated.

## 2012-01-28 LAB — COMPREHENSIVE METABOLIC PANEL
BUN: 12 mg/dL (ref 6–23)
CO2: 22 mEq/L (ref 19–32)
Calcium: 10 mg/dL (ref 8.4–10.5)
Chloride: 98 mEq/L (ref 96–112)
Creatinine, Ser: 0.86 mg/dL (ref 0.50–1.10)
Glucose, Bld: 330 mg/dL — ABNORMAL HIGH (ref 70–99)
Total Bilirubin: 0.4 mg/dL (ref 0.3–1.2)

## 2012-01-28 LAB — LACTATE DEHYDROGENASE: LDH: 106 U/L (ref 94–250)

## 2012-01-28 NOTE — Progress Notes (Signed)
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  CURRENT THERAPY:  Observation.  INTERIM HISTORY:  Stephanie Carroll comes in for her followup.  We see her every 6 months.  She is doing okay.  Unfortunately, she is having some problems with diabetes because she has required some steroids for tennis elbow of the right arm.  She has had no problems with nausea or vomiting.  She has had no cough or shortness of breath.  Her last CEA level, which we checked back in June, was 1.6 and holding steady.  Her last scans were done back 2 years ago.  She apparently had a CT of the head back in August.  This appeared normal.  She has had no fevers, sweats or chills.  There has been no bleeding.  PHYSICAL EXAMINATION:  General:  This is a well-developed, well- nourished white female in no obvious distress.  Vital signs: Temperature of 97.9, pulse 98, respiratory rate 16, blood pressure 140/81.  Weight is 149.  Head and neck:  Normocephalic, atraumatic skull.  There are no ocular or oral lesions.  There are no palpable cervical or supraclavicular lymph nodes.  Lungs:  Clear bilaterally. Cardiac:  Regular rate and rhythm, with a normal S1 and S2.  There are no murmurs, rubs or bruits.  Abdomen:  Soft, with good bowel sounds. She has a laparotomy wound that is well healed.  She has no fluid wave. There is no guarding or rebound tenderness.  There is no palpable hepatosplenomegaly.  Extremities:  Show no clubbing, cyanosis or edema. She has some dry skin.  Back:  No tenderness over the spine, ribs, or hips.  Neurologic:  No focal neurological deficits.  LABORATORY STUDIES:  White cell count is 11, hemoglobin 14.2, hematocrit 42.1, platelet count 304,000.  IMPRESSION:  Stephanie Carroll is a very charming 56 year old white female with stage II colon cancer.  She did not require any adjuvant therapy.  She is now over 5 years out from resection.  We will continue to see her back every 6  months.    ______________________________ Volanda Napoleon, M.D. PRE/MEDQ  D:  01/27/2012  T:  01/28/2012  Job:  [id number]

## 2012-01-30 ENCOUNTER — Telehealth: Payer: Self-pay | Admitting: *Deleted

## 2012-01-30 NOTE — Telephone Encounter (Signed)
Message copied by Rico Ala on Mon Jan 30, 2012  1:38 PM ------      Message from: Volanda Napoleon      Created: Sat Jan 28, 2012 12:20 PM       Call - labs are ok, but glucose is way too high!!!  Be careful!!!  pete

## 2012-01-30 NOTE — Telephone Encounter (Signed)
Left message on patients personal cell phone letting her know that her labs were ok but her glucose was way too high. Gave patient normal values as well as her glucose value and told patient we wanted her to be aware and may want to follow up with her primary care physician.

## 2012-06-18 DIAGNOSIS — IMO0002 Reserved for concepts with insufficient information to code with codable children: Secondary | ICD-10-CM | POA: Insufficient documentation

## 2012-06-18 DIAGNOSIS — E041 Nontoxic single thyroid nodule: Secondary | ICD-10-CM | POA: Insufficient documentation

## 2012-07-27 ENCOUNTER — Other Ambulatory Visit (HOSPITAL_BASED_OUTPATIENT_CLINIC_OR_DEPARTMENT_OTHER): Payer: PRIVATE HEALTH INSURANCE | Admitting: Lab

## 2012-07-27 ENCOUNTER — Ambulatory Visit (HOSPITAL_BASED_OUTPATIENT_CLINIC_OR_DEPARTMENT_OTHER): Payer: PRIVATE HEALTH INSURANCE | Admitting: Hematology & Oncology

## 2012-07-27 VITALS — BP 126/75 | HR 99 | Temp 97.4°F | Resp 16 | Ht 61.0 in | Wt 145.0 lb

## 2012-07-27 DIAGNOSIS — E119 Type 2 diabetes mellitus without complications: Secondary | ICD-10-CM

## 2012-07-27 DIAGNOSIS — C182 Malignant neoplasm of ascending colon: Secondary | ICD-10-CM

## 2012-07-27 LAB — CBC WITH DIFFERENTIAL (CANCER CENTER ONLY)
BASO#: 0.1 10*3/uL (ref 0.0–0.2)
EOS%: 1.7 % (ref 0.0–7.0)
Eosinophils Absolute: 0.2 10*3/uL (ref 0.0–0.5)
HCT: 40 % (ref 34.8–46.6)
HGB: 13.1 g/dL (ref 11.6–15.9)
LYMPH#: 3.3 10*3/uL (ref 0.9–3.3)
MCHC: 32.8 g/dL (ref 32.0–36.0)
MONO#: 0.7 10*3/uL (ref 0.1–0.9)
NEUT#: 7.8 10*3/uL — ABNORMAL HIGH (ref 1.5–6.5)
RBC: 4.61 10*6/uL (ref 3.70–5.32)
WBC: 12.1 10*3/uL — ABNORMAL HIGH (ref 3.9–10.0)

## 2012-07-27 NOTE — Progress Notes (Signed)
This office note has been dictated.

## 2012-07-28 LAB — COMPREHENSIVE METABOLIC PANEL
Albumin: 4.6 g/dL (ref 3.5–5.2)
Alkaline Phosphatase: 88 U/L (ref 39–117)
Calcium: 9.7 mg/dL (ref 8.4–10.5)
Chloride: 101 mEq/L (ref 96–112)
Glucose, Bld: 169 mg/dL — ABNORMAL HIGH (ref 70–99)
Potassium: 4 mEq/L (ref 3.5–5.3)
Sodium: 137 mEq/L (ref 135–145)
Total Protein: 7.1 g/dL (ref 6.0–8.3)

## 2012-07-30 ENCOUNTER — Telehealth: Payer: Self-pay | Admitting: Hematology & Oncology

## 2012-07-30 NOTE — Progress Notes (Signed)
DIAGNOSIS:  Stage II (T2 N0 M0) adenocarcinoma of the sigmoid colon.  CURRENT THERAPY:  Observation.  INTERIM HISTORY:  Stephanie Carroll comes in for followup.  She feels a little bit tired.  She feels that this is because her blood sugars are high. She is having some problems with her, I think, right knee.  She had some steroid injections into the knee.  These have helped a little bit.  She wants to try to avoid surgery.  As far as her colon cancer is concerned, it has now been about 5-1/2 years.  She has had no evidence of recurrence.  When we last saw her in December 2013, her CEA was 1.9.  She has had no bleeding.  There has been no change in bowel or bladder habits.  She has had no cough or shortness breath.  She had the "flu" for about 5 weeks over the wintertime.  Her mother, who is staying with her, had the flu, and she feels that she got it from her.  She was on Tamiflu for this.  PHYSICAL EXAMINATION:  General:  This is a fairly well-developed, well- nourished white female in no obvious distress.  Vital signs: Temperature of 97.4, pulse 99, respiratory rate 16, blood pressure 126/75.  Weight is 145.  Head/Neck:  Normocephalic, atraumatic skull. There are no ocular or oral lesions.  There are no palpable cervical or supraclavicular lymph nodes.  Lungs:  Clear bilaterally.  Cardiac: Regular rate and rhythm with a normal S1, S2.  There are no murmurs, rubs or bruits.  Abdomen:  Soft with good bowel sounds.  She has a well- healed laparotomy scar.  There is no fluid wave.  She has had no palpable hepatosplenomegaly.  Back:  No tenderness over the spine, ribs, or hips.  Extremities:  No clubbing, cyanosis or edema.  Neurological: No focal neurological deficits.  LABORATORY STUDIES:  White cell count is 12.1, hemoglobin 13.1, hematocrit 40, platelet count 297.  IMPRESSION:  Stephanie Carroll is a very charming 57 year old white female with stage IIA adenocarcinoma of the sigmoid colon.  She  underwent resection. She did not require any adjuvant chemotherapy.  She is now 5-1/2 years out from her resection.  We will continue to follow her along every 6 months.  She feels confident coming back to see [place] for "peace of mind."    ______________________________ Volanda Napoleon, M.D. PRE/MEDQ  D:  07/27/2012  T:  07/29/2012  Job:  [id number]

## 2012-07-30 NOTE — Telephone Encounter (Addendum)
Message copied by Trevor Mace on Mon Jul 30, 2012  2:42 PM ------      Message from: Burney Gauze R      Created: Mon Jul 30, 2012  7:35 AM       Call - labs are good.  Laurey Arrow  07-30-12  Called and left message on machine regarding above MD message. Gavin Potters LPN ------

## 2013-03-29 ENCOUNTER — Encounter: Payer: Self-pay | Admitting: Hematology & Oncology

## 2013-03-29 ENCOUNTER — Other Ambulatory Visit (HOSPITAL_BASED_OUTPATIENT_CLINIC_OR_DEPARTMENT_OTHER): Payer: PRIVATE HEALTH INSURANCE | Admitting: Lab

## 2013-03-29 ENCOUNTER — Ambulatory Visit (HOSPITAL_BASED_OUTPATIENT_CLINIC_OR_DEPARTMENT_OTHER): Payer: PRIVATE HEALTH INSURANCE | Admitting: Hematology & Oncology

## 2013-03-29 VITALS — BP 134/68 | HR 98 | Temp 97.5°F | Resp 14 | Ht 61.0 in | Wt 144.0 lb

## 2013-03-29 DIAGNOSIS — C189 Malignant neoplasm of colon, unspecified: Secondary | ICD-10-CM

## 2013-03-29 DIAGNOSIS — Z85038 Personal history of other malignant neoplasm of large intestine: Secondary | ICD-10-CM

## 2013-03-29 DIAGNOSIS — C182 Malignant neoplasm of ascending colon: Secondary | ICD-10-CM

## 2013-03-29 DIAGNOSIS — E119 Type 2 diabetes mellitus without complications: Secondary | ICD-10-CM

## 2013-03-29 LAB — CBC WITH DIFFERENTIAL (CANCER CENTER ONLY)
BASO#: 0.1 10*3/uL (ref 0.0–0.2)
BASO%: 0.6 % (ref 0.0–2.0)
EOS ABS: 0.2 10*3/uL (ref 0.0–0.5)
EOS%: 1.8 % (ref 0.0–7.0)
HEMATOCRIT: 39.7 % (ref 34.8–46.6)
HEMOGLOBIN: 12.9 g/dL (ref 11.6–15.9)
LYMPH#: 3.2 10*3/uL (ref 0.9–3.3)
LYMPH%: 32.2 % (ref 14.0–48.0)
MCH: 27.6 pg (ref 26.0–34.0)
MCHC: 32.5 g/dL (ref 32.0–36.0)
MCV: 85 fL (ref 81–101)
MONO#: 0.7 10*3/uL (ref 0.1–0.9)
MONO%: 7 % (ref 0.0–13.0)
NEUT%: 58.4 % (ref 39.6–80.0)
NEUTROS ABS: 5.8 10*3/uL (ref 1.5–6.5)
Platelets: 291 10*3/uL (ref 145–400)
RBC: 4.68 10*6/uL (ref 3.70–5.32)
RDW: 13.8 % (ref 11.1–15.7)
WBC: 9.9 10*3/uL (ref 3.9–10.0)

## 2013-03-29 LAB — COMPREHENSIVE METABOLIC PANEL
ALBUMIN: 4.6 g/dL (ref 3.5–5.2)
ALT: 49 U/L — ABNORMAL HIGH (ref 0–35)
AST: 29 U/L (ref 0–37)
Alkaline Phosphatase: 95 U/L (ref 39–117)
BUN: 7 mg/dL (ref 6–23)
CALCIUM: 9.7 mg/dL (ref 8.4–10.5)
CHLORIDE: 100 meq/L (ref 96–112)
CO2: 26 meq/L (ref 19–32)
CREATININE: 0.61 mg/dL (ref 0.50–1.10)
GLUCOSE: 218 mg/dL — AB (ref 70–99)
POTASSIUM: 4 meq/L (ref 3.5–5.3)
Sodium: 138 mEq/L (ref 135–145)
Total Bilirubin: 0.4 mg/dL (ref 0.2–1.2)
Total Protein: 7.2 g/dL (ref 6.0–8.3)

## 2013-03-29 LAB — CEA: CEA: 1.5 ng/mL (ref 0.0–5.0)

## 2013-03-29 LAB — LACTATE DEHYDROGENASE: LDH: 123 U/L (ref 94–250)

## 2013-03-29 NOTE — Progress Notes (Signed)
[facility] at [place], [place], [place].  [phone] (fax)   Your Providers PCP: Rosilyn Mings Pasquini, RN    DIAGNOSES:  Problem List Items Addressed This Visit   None    Visit Diagnoses   Stage II carcinoma of colon    -  Primary       CURRENT THERAPY: Observation  INTERIM HISTORY: Stephanie Carroll comes in for followup. I saw her back in June of 2014. She's been doing okay. She does have some knee problems with her right knee. She's been taking some injections. She had been on some steroids.  She's walking 45 minutes a day. I she's really doing well with this.  As far as her colon cancer is concerned, I don't see any his with this. Her last CEA level back in June was 1.8.  She does have diabetes. Her blood sugars have been on the high side because of the steroids.  She's had no cough shortness of breath. She had a mammogram recently which was normal.  PHYSICAL EXAMINATION: This is a petite white female in no obvious distress. She is slightly cushingoid in the face. There is no adenopathy in the neck. She is no oral lesion. There is no ocular lesions. Pupils react appropriate. Lungs show some slight wheezes bilaterally. Cardiac exam regular in rhythm with a normal S1 and S2. Abdomen is soft. There was a laparotomy scar. There is no fluid wave. There is no palpable hepato- megaly back is him no tenderness over the spine ribs or hips. Extremities shows no clubbing cyanosis or edema. Skin exam shows a slightly dry skin.   Vital signs:   Filed Vitals:   03/29/13 1516  BP: 134/68  Pulse: 98  Temp: 97.5 F (36.4 C)  TempSrc: Oral  Resp: 14  Height: 5' 1"  (1.549 m)  Weight: 144 lb (65.318 kg)      LABORATORY STUDIES:  CBC    Component Value Date/Time   WBC 9.9 03/29/2013 1451   WBC 11.6* 10/14/2011 2320   WBC 8.4 06/12/2009 0841   RBC 4.92 10/14/2011 2320   RBC 4.70 06/12/2009 0841   HGB 12.9 03/29/2013  1451   HGB 14.6 10/14/2011 2331   HGB 14.7 06/12/2009 0841   HCT 39.7 03/29/2013 1451   HCT 43.0 10/14/2011 2331   HCT 42.3 06/12/2009 0841   PLT 291 03/29/2013 1451   PLT 310 10/14/2011 2320   PLT 287 06/12/2009 0841   MCV 85 03/29/2013 1451   MCV 84.3 10/14/2011 2320   MCV 90.0 06/12/2009 0841   MCH 27.6 03/29/2013 1451   MCH 28.5 10/14/2011 2320   MCH 31.2 06/12/2009 0841   MCHC 32.5 03/29/2013 1451   MCHC 33.7 10/14/2011 2320   MCHC 34.7 06/12/2009 0841   RDW 13.8 03/29/2013 1451   RDW 13.4 10/14/2011 2320   RDW 13.4 06/12/2009 0841   LYMPHSABS 3.2 03/29/2013 1451   LYMPHSABS 3.9 06/26/2009 1520   LYMPHSABS 3.0 06/12/2009 0841   MONOABS 0.6 06/26/2009 1520   MONOABS 0.6 06/12/2009 0841   EOSABS 0.2 03/29/2013 1451   EOSABS 0.2 06/26/2009 1520   EOSABS 0.1 06/12/2009 0841   BASOSABS 0.1 03/29/2013 1451   BASOSABS 0.1 06/26/2009 1520   BASOSABS 0.0 06/12/2009 0841     IMPRESSION: Stephanie Carroll is a 58 y.o. female with a history of stage II (T3N0M0) of the sigmoid colon. She had this resected back in September of 2008. There  is no indication for adjuvant chemotherapy.  I suspect that she is cured. I think the risk of. Of her having recurrent is going to be less than 10%.  I don't see a need for any scans or additional x-ray studies. We are watching her CEA level.  PLAN: We will plan to get her back to see [place] in another 8 months. If there is any problem in between visits, she can certainly come back and see [place].   ______________________________  Volanda Napoleon, M.D.  03/29/2013 4:12 PM

## 2013-04-04 ENCOUNTER — Other Ambulatory Visit: Payer: Self-pay

## 2013-04-04 ENCOUNTER — Telehealth: Payer: Self-pay | Admitting: Nurse Practitioner

## 2013-04-04 NOTE — Telephone Encounter (Addendum)
Message copied by Jimmy Footman on Thu Apr 04, 2013  2:08 PM ------      Message from: Burney Gauze R      Created: Thu Apr 04, 2013  6:39 AM       Call - labs are ok!!  Laurey Arrow ------LVM on pt's personal machine and informed her to contact the office if she had any further questions.

## 2013-04-09 ENCOUNTER — Telehealth: Payer: Self-pay | Admitting: Gastroenterology

## 2013-04-09 NOTE — Telephone Encounter (Signed)
Pt states she has been having pain right at her tail bone and it is hard to sit for a long period of time due to the pain. The area is not on the rectum. Instructed pt to call her PCP to have this looked at since it does not sound GI related. Dr. Deatra Ina aware.

## 2013-04-09 NOTE — Telephone Encounter (Signed)
Agree 

## 2013-09-13 ENCOUNTER — Telehealth: Payer: Self-pay | Admitting: Gastroenterology

## 2013-09-13 NOTE — Telephone Encounter (Signed)
Left message for pt to call back.  Pt states she had some bright red blood from her rectum several times last week. States she has a h/o colon cancer. Was seen by her gyn and she did not see any blood but told pt to follow-up with GI. Pt scheduled to see Alonza Bogus PA 09/20/13@2pm . Pt aware of appt.

## 2013-09-20 ENCOUNTER — Ambulatory Visit (INDEPENDENT_AMBULATORY_CARE_PROVIDER_SITE_OTHER): Payer: PRIVATE HEALTH INSURANCE | Admitting: Gastroenterology

## 2013-09-20 ENCOUNTER — Encounter: Payer: Self-pay | Admitting: Gastroenterology

## 2013-09-20 VITALS — BP 106/70 | HR 96 | Ht 60.25 in | Wt 141.1 lb

## 2013-09-20 DIAGNOSIS — Z85038 Personal history of other malignant neoplasm of large intestine: Secondary | ICD-10-CM

## 2013-09-20 DIAGNOSIS — K625 Hemorrhage of anus and rectum: Secondary | ICD-10-CM

## 2013-09-20 MED ORDER — PROMETHAZINE HCL 25 MG PO TABS: 25.0000 mg | ORAL_TABLET | Freq: Four times a day (QID) | ORAL | Status: DC | PRN

## 2013-09-20 MED ORDER — NA SULFATE-K SULFATE-MG SULF 17.5-3.13-1.6 GM/177ML PO SOLN: ORAL | Status: DC

## 2013-09-20 NOTE — Patient Instructions (Addendum)
You have been scheduled for a colonoscopy with Dr. Deatra Ina. Please follow written instructions given to you at your visit today.  If you use inhalers (even only as needed), please bring them with you on the day of your procedure. Your physician has requested that you go to www.startemmi.com and enter the access code given to you at your visit today. This web site gives a general overview about your procedure. However, you should still follow specific instructions given to you by our office regarding your preparation for the procedure.

## 2013-09-20 NOTE — Addendum Note (Signed)
Addended by: Hope Pigeon A on: 09/20/2013 03:20 PM   Modules accepted: Orders

## 2013-09-20 NOTE — Progress Notes (Signed)
     09/20/2013 Stephanie Carroll [id number] [birth year]   History of Present Illness:  This is a pleasant 58 year old female who is known to Dr. Deatra Ina.  Has a history of colon cancer in 2008, requiring only surgery.  Her last colonoscopy was in 06/2010 showed only internal hemorrhoids.  She presents to our office today with rectal bleeding x 2 within the past two weeks.  Was bright red blood but quite a large amount.  Resolved on its own and has not seen any since over a week ago.  Has intermittent diarrhea since her colon surgery, but this is unchanged and not severe.  Current Medications, Allergies, Past Medical History, Past Surgical History, Family History and Social History were reviewed in [employer] record.   Physical Exam: BP 106/70  Pulse 96  Ht 5' 0.25" (1.53 m)  Wt 141 lb 2 oz (64.014 kg)  BMI 27.35 kg/m2 General: Well developed white female in no acute distress Head: Normocephalic and atraumatic Eyes:  Sclerae anicteric, conjunctiva pink  Ears: Normal auditory acuity Lungs: Clear throughout to auscultation Heart: Regular rate and rhythm Abdomen: Soft, non-distended.  Normal bowel sounds.  Non-tender.  Laparotomy scar noted.  Small umbilical hernia noted. Rectal:  One hemorrhoid noted.  Not red or actively bleeding.  Light brown stool on exam glove.  Heme negative. Musculoskeletal: Symmetrical with no gross deformities  Extremities: No edema  Neurological: Alert oriented x 4, grossly non-focal Psychological:  Alert and cooperative. Normal mood and affect  Assessment and Recommendations: -Rectal bleeding:  Possibly hemorrhoidal/outlet bleeding. -Personal history of colon cancer in 2008.  Last colonoscopy 06/2010. -Diarrhea:  Intermittent since colon surgery.  Also on , which has SE of diarrhea.  *I think that will her history that we should repeat colonoscopy.  Patient is in agreement.  Will schedule with Dr. Deatra Ina.  The risks, benefits, and alternatives  were discussed with the patient and she consents to proceed.

## 2013-09-26 NOTE — Progress Notes (Signed)
Reviewed and agree with management. Robert D. Kaplan, M.D., FACG  

## 2013-10-01 ENCOUNTER — Encounter: Payer: Self-pay | Admitting: Gastroenterology

## 2013-10-01 ENCOUNTER — Ambulatory Visit (AMBULATORY_SURGERY_CENTER): Payer: PRIVATE HEALTH INSURANCE | Admitting: Gastroenterology

## 2013-10-01 VITALS — BP 119/73 | HR 81 | Temp 96.5°F | Resp 11 | Ht 60.25 in | Wt 141.0 lb

## 2013-10-01 DIAGNOSIS — K625 Hemorrhage of anus and rectum: Secondary | ICD-10-CM

## 2013-10-01 DIAGNOSIS — Z85038 Personal history of other malignant neoplasm of large intestine: Secondary | ICD-10-CM

## 2013-10-01 DIAGNOSIS — D126 Benign neoplasm of colon, unspecified: Secondary | ICD-10-CM

## 2013-10-01 LAB — GLUCOSE, CAPILLARY
GLUCOSE-CAPILLARY: 138 mg/dL — AB (ref 70–99)
Glucose-Capillary: 182 mg/dL — ABNORMAL HIGH (ref 70–99)

## 2013-10-01 MED ORDER — SODIUM CHLORIDE 0.9 % IV SOLN: 500.0000 mL | INTRAVENOUS | Status: DC

## 2013-10-01 NOTE — Patient Instructions (Addendum)
Call to schedule the Band Ligation of the Hemorrhoids, [phone].   YOU HAD AN ENDOSCOPIC PROCEDURE TODAY AT [facility]: Refer to the procedure report that was given to you for any specific questions about what was found during the examination.  If the procedure report does not answer your questions, please call your gastroenterologist to clarify.  If you requested that your care partner not be given the details of your procedure findings, then the procedure report has been included in a sealed envelope for you to review at your convenience later.  YOU SHOULD EXPECT: Some feelings of bloating in the abdomen. Passage of more gas than usual.  Walking can help get rid of the air that was put into your GI tract during the procedure and reduce the bloating. If you had a lower endoscopy (such as a colonoscopy or flexible sigmoidoscopy) you may notice spotting of blood in your stool or on the toilet paper. If you underwent a bowel prep for your procedure, then you may not have a normal bowel movement for a few days.  DIET: Your first meal following the procedure should be a light meal and then it is ok to progress to your normal diet.  A half-sandwich or bowl of soup is an example of a good first meal.  Heavy or fried foods are harder to digest and may make you feel nauseous or bloated.  Likewise meals heavy in dairy and vegetables can cause extra gas to form and this can also increase the bloating.  Drink plenty of fluids but you should avoid alcoholic beverages for 24 hours.  ACTIVITY: Your care partner should take you home directly after the procedure.  You should plan to take it easy, moving slowly for the rest of the day.  You can resume normal activity the day after the procedure however you should NOT DRIVE or use heavy machinery for 24 hours (because of the sedation medicines used during the test).    SYMPTOMS TO REPORT IMMEDIATELY: A gastroenterologist can be reached at any hour.   During normal business hours, 8:30 AM to 5:00 PM Monday through Friday, call [phone].  After hours and on weekends, please call the GI answering service at [phone] who will take a message and have the physician on call contact you.   Following lower endoscopy (colonoscopy or flexible sigmoidoscopy):  Excessive amounts of blood in the stool  Significant tenderness or worsening of abdominal pains  Swelling of the abdomen that is new, acute  Fever of 100F or higher  FOLLOW UP: If any biopsies were taken you will be contacted by phone or by letter within the next 1-3 weeks.  Call your gastroenterologist if you have not heard about the biopsies in 3 weeks.  Our staff will call the home number listed on your records the next business day following your procedure to check on you and address any questions or concerns that you may have at that time regarding the information given to you following your procedure. This is a courtesy call and so if there is no answer at the home number and we have not heard from you through the emergency physician on call, we will assume that you have returned to your regular daily activities without incident.  SIGNATURES/CONFIDENTIALITY: You and/or your care partner have signed paperwork which will be entered into your electronic medical record.  These signatures attest to the fact that that the information above on your After Visit Summary has been  reviewed and is understood.  Full responsibility of the confidentiality of this discharge information lies with you and/or your care-partner.

## 2013-10-01 NOTE — Progress Notes (Signed)
Called to room to assist during endoscopic procedure.  Patient ID and intended procedure confirmed with present staff. Received instructions for my participation in the procedure from the performing physician.  

## 2013-10-01 NOTE — Op Note (Signed)
[facility]  [address] [place], [id number]   COLONOSCOPY PROCEDURE REPORT  PATIENT: Stephanie, Carroll  MR#: [id number] BIRTHDATE: February 06, 1956 , 30  yrs. old GENDER: Female ENDOSCOPIST: Inda Castle, MD REFERRED BY: PROCEDURE DATE:  10/01/2013 PROCEDURE:   Colonoscopy with snare polypectomy and Colonoscopy with cold biopsy polypectomy First Screening Colonoscopy - Avg.  risk and is 50 yrs.  old or older - No.  Prior Negative Screening - Now for repeat screening. N/A  History of Adenoma - Now for follow-up colonoscopy & has been > or = to 3 yrs.  No.  It has been less than 3 yrs since last colonoscopy.  Other: See Comments  Polyps Removed Today? Yes. ASA CLASS:   Class II INDICATIONS:Rectal Bleeding and High risk patient with personal history of colon cancer 2008.  2012 colonoscopy demonstrated internal hemorrhoids MEDICATIONS: MAC sedation, administered by CRNA and Propofol (Diprivan) 230 mg IV  DESCRIPTION OF PROCEDURE:   After the risks benefits and alternatives of the procedure were thoroughly explained, informed consent was obtained.  A digital rectal exam revealed no abnormalities of the rectum.   The LB [id number]  endoscope was introduced through the anus and advanced to the surgical anastomosis. No adverse events experienced.   The quality of the prep was excellent using Suprep  The instrument was then slowly withdrawn as the colon was fully examined.      COLON FINDINGS: A sessile polyp measuring 6 mm in size was found at the anastomosis.  Polypectomy was performed.  A polypectomy was performed with a cold snare.  The resection was complete and the polyp tissue was completely retrieved. In the rectum there was a flat 1 mm polyp.  This was removed with cold biopsy forceps. Internal hemorrhoids were found.   The colon was otherwise normal. There was no diverticulosis, inflammation, polyps or cancers unless previously stated.  Retroflexed  views revealed no abnormalities. The time to cecum=1 minutes 52 seconds.  Withdrawal time=6 minutes 32 seconds.  The scope was withdrawn and the procedure completed. COMPLICATIONS: There were no complications.  ENDOSCOPIC IMPRESSION: 1.   Sessile polyp measuring 6 mm in size was foundat the anastomosis.; Polypectomy was performed; polypectomy was performed with a cold snare 2.  rectal polyp 3.   Internal hemorrhoids - source for rectal bleeding 4.   The colon was otherwise normal  RECOMMENDATIONS: 1.  If the polyp(s) removed today are proven to be adenomatous (pre-cancerous) polyps, you will need a repeat colonoscopy in 5 years.  Otherwise you should continue to follow colorectal cancer screening guidelines for "routine risk" patients with colonoscopy in 10 years.  You will receive a letter within 1-2 weeks with the results of your biopsy as well as final recommendations.  Please call my office if you have not received a letter after 3 weeks. 2.  Band ligation of internal hemorrhoids   eSigned:  Inda Castle, MD 10/01/2013 10:07 AM   cc: Elmer Ramp, MD   PATIENT NAME:  Stephanie, Carroll MR#: [id number]

## 2013-10-01 NOTE — Progress Notes (Signed)
Report to PACU, RN, vss, BBS= Clear.  

## 2013-10-02 ENCOUNTER — Telehealth: Payer: Self-pay | Admitting: *Deleted

## 2013-10-02 NOTE — Telephone Encounter (Signed)
  Follow up Call-  Call back number 10/01/2013  Post procedure Call Back phone  # [phone]  Permission to leave phone message Yes     Patient questions:  Do you have a fever, pain , or abdominal swelling? No. Pain Score  0 *  Have you tolerated food without any problems? Yes.    Have you been able to return to your normal activities? Yes.    Do you have any questions about your discharge instructions: Diet   No. Medications  No. Follow up visit  No.  Do you have questions or concerns about your Care? No.  Actions: * If pain score is 4 or above: No action needed, pain <4.

## 2013-10-08 ENCOUNTER — Encounter: Payer: Self-pay | Admitting: Gastroenterology

## 2013-10-14 ENCOUNTER — Telehealth: Payer: Self-pay | Admitting: Gastroenterology

## 2013-10-14 NOTE — Telephone Encounter (Signed)
The patient would like to have her colonoscopy repeated in a year due to her history. She is pleased with the results. But she is not comfortable with 5 years due to her history of colon cancer.

## 2013-10-15 NOTE — Telephone Encounter (Signed)
Patient advised-recall changed to 3 years.

## 2013-10-15 NOTE — Telephone Encounter (Signed)
1 year f/u is not indicated.  I am willing to do a 3 year f/u but pt can feel reassured that sooner f/u is not necessary, in the absent of symptoms.  She needs band ligation of hemorrhoids.  If she wishes I can talk with her further at that time.

## 2013-11-27 DIAGNOSIS — K760 Fatty (change of) liver, not elsewhere classified: Secondary | ICD-10-CM | POA: Insufficient documentation

## 2013-11-29 ENCOUNTER — Telehealth: Payer: Self-pay | Admitting: Gastroenterology

## 2013-11-29 ENCOUNTER — Ambulatory Visit (HOSPITAL_BASED_OUTPATIENT_CLINIC_OR_DEPARTMENT_OTHER): Payer: PRIVATE HEALTH INSURANCE | Admitting: Family

## 2013-11-29 ENCOUNTER — Other Ambulatory Visit (HOSPITAL_BASED_OUTPATIENT_CLINIC_OR_DEPARTMENT_OTHER): Payer: PRIVATE HEALTH INSURANCE | Admitting: Lab

## 2013-11-29 VITALS — BP 139/73 | HR 101 | Temp 97.7°F | Resp 16 | Wt 145.0 lb

## 2013-11-29 DIAGNOSIS — C187 Malignant neoplasm of sigmoid colon: Secondary | ICD-10-CM

## 2013-11-29 DIAGNOSIS — C182 Malignant neoplasm of ascending colon: Secondary | ICD-10-CM

## 2013-11-29 DIAGNOSIS — C189 Malignant neoplasm of colon, unspecified: Secondary | ICD-10-CM

## 2013-11-29 LAB — CMP (CANCER CENTER ONLY)
ALK PHOS: 93 U/L — AB (ref 26–84)
ALT(SGPT): 36 U/L (ref 10–47)
AST: 26 U/L (ref 11–38)
Albumin: 3.6 g/dL (ref 3.3–5.5)
BILIRUBIN TOTAL: 0.4 mg/dL (ref 0.20–1.60)
BUN, Bld: 12 mg/dL (ref 7–22)
CO2: 25 mEq/L (ref 18–33)
CREATININE: 0.8 mg/dL (ref 0.6–1.2)
Calcium: 9.2 mg/dL (ref 8.0–10.3)
Chloride: 98 mEq/L (ref 98–108)
Glucose, Bld: 224 mg/dL — ABNORMAL HIGH (ref 73–118)
Potassium: 3.8 mEq/L (ref 3.3–4.7)
Sodium: 141 mEq/L (ref 128–145)
Total Protein: 7.4 g/dL (ref 6.4–8.1)

## 2013-11-29 LAB — CBC WITH DIFFERENTIAL (CANCER CENTER ONLY)
BASO#: 0.1 10*3/uL (ref 0.0–0.2)
BASO%: 1 % (ref 0.0–2.0)
EOS ABS: 0.2 10*3/uL (ref 0.0–0.5)
EOS%: 2.2 % (ref 0.0–7.0)
HEMATOCRIT: 39.3 % (ref 34.8–46.6)
HEMOGLOBIN: 13 g/dL (ref 11.6–15.9)
LYMPH#: 3.2 10*3/uL (ref 0.9–3.3)
LYMPH%: 33.6 % (ref 14.0–48.0)
MCH: 28.3 pg (ref 26.0–34.0)
MCHC: 33.1 g/dL (ref 32.0–36.0)
MCV: 85 fL (ref 81–101)
MONO#: 0.6 10*3/uL (ref 0.1–0.9)
MONO%: 6 % (ref 0.0–13.0)
NEUT%: 57.2 % (ref 39.6–80.0)
NEUTROS ABS: 5.4 10*3/uL (ref 1.5–6.5)
Platelets: 313 10*3/uL (ref 145–400)
RBC: 4.6 10*6/uL (ref 3.70–5.32)
RDW: 13.9 % (ref 11.1–15.7)
WBC: 9.4 10*3/uL (ref 3.9–10.0)

## 2013-11-29 NOTE — Progress Notes (Signed)
[facility]  [phone] Fax:([phone]) [phone]  ID: Stephanie Carroll OB: 01-12-1956 MR#: [id number] JYN#:[id number] Patient Care Team: Rosilyn Mings Pasquini, RN as PCP - General  DIAGNOSIS: Stage II carcinoma of colon  INTERVAL HISTORY: Stephanie Carroll comes is here today for a follow-up. She is feeling good. She had an episode of rectal bleeding a few months ago. She had a colonoscopy that showed she had internal hemorrhoids and 2 polyp. On poly was benign and the other polyp was precancerous. She will have another colonoscopy in 3 years. She has had no more episodes of bleeding. Her last CEA in February was 1.5.  She does have diabetes that she has a hard time controlling. She is using an herbal supplement called Plexus to try and help this. She states that she is up to date on her mammograms and they have been normal. She denies fever, chills, n/v, cough, rash, headaches, dizziness, SOB, chest pain, palpitations, abdominal pain, constipation, diarrhea, blood in urine or stool. No swelling, tenderness, numbness or tingling. Her appetite is good and she is well hydrated.   CURRENT TREATMENT: Observation  REVIEW OF SYSTEMS: All other 10 point review of systems is negative.   PAST MEDICAL HISTORY: Past Medical History  Diagnosis Date  . Colon cancer 2008  . Asthma   . Diabetes mellitus   . GERD (gastroesophageal reflux disease)   . Arthritis     DDD  . Thyroid tumor, benign     PAST SURGICAL HISTORY: Past Surgical History  Procedure Laterality Date  . Colon surgery    . Partial hysterectomy    . Incisional hernia repair    . Nasal septum surgery    . Cesarean section      x 1  . Wrist ganglion excision      left  . Polypectomy    . Upper gastrointestinal endoscopy    . Biopsy thyroid      X2    FAMILY HISTORY Family History  Problem Relation Age of Onset  . Colon cancer Neg Hx   . COPD      both sides  . Diabetes      both sides  . Heart disease Father    . Lung cancer Paternal Uncle   . Leukemia Paternal Grandfather   . Cervical cancer Paternal Aunt     x 2   . Heart defect Mother     MVP  . Kidney failure Father     GYNECOLOGIC HISTORY:  No LMP recorded. Patient has had a hysterectomy.   SOCIAL HISTORY: History   Social History  . Marital Status: Married    Spouse Name: N/A    Number of Children: 1  . Years of Education: N/A   Occupational History  . claims for [employer]    Social History Main Topics  . Smoking status: Never Smoker   . Smokeless tobacco: Never Used     Comment: never used product  . Alcohol Use: No  . Drug Use: No  . Sexual Activity: Not on file   Other Topics Concern  . Not on file   Social History Narrative  . No narrative on file    ADVANCED DIRECTIVES:  <no information>  HEALTH MAINTENANCE: History  Substance Use Topics  . Smoking status: Never Smoker   . Smokeless tobacco: Never Used     Comment: never used product  . Alcohol Use: No   Colonoscopy: PAP: Bone density: Lipid panel:  Allergies  Allergen Reactions  . Fluticasone-Salmeterol   . Metronidazole   . Morphine     REACTION: heart racing  . Other     Oats and Green peppers---headaches  . Peanuts [Peanut Oil] Other (See Comments)    congested  . Penicillins     REACTION: rash  . Sulfonamide Derivatives     Current Outpatient Prescriptions  Medication Sig Dispense Refill  . albuterol (PROVENTIL HFA;VENTOLIN HFA) 108 (90 BASE) MCG/ACT inhaler Inhale 1 puff into the lungs every 6 (six) hours as needed. For shortness for breath      . cetirizine (ZYRTEC ALLERGY) 10 MG tablet Take 10 mg by mouth daily.        . cholecalciferol (VITAMIN D) 1000 UNITS tablet Take 1,000 Units by mouth daily.       . Cyanocobalamin (VITAMIN B-12) 500 MCG SUBL Place 500 mcg under the tongue daily.        Marland Kitchen guaiFENesin 200 MG tablet Take 400 mg by mouth 2 (two) times daily.      Marland Kitchen LORazepam (ATIVAN) 0.5 MG tablet Take 0.5 mg by mouth every  8 (eight) hours as needed. For anxiety      . metFORMIN (GLUCOPHAGE) 500 MG tablet Take 500 mg by mouth 3 (three) times daily with meals.        . mometasone (ASMANEX) 220 MCG/INH inhaler Inhale 1 puff into the lungs 2 (two) times daily.        . promethazine (PHENERGAN) 25 MG tablet Take 1 tablet (25 mg total) by mouth every 6 (six) hours as needed for nausea or vomiting.  6 tablet  0  . RABEprazole (ACIPHEX) 20 MG tablet Take 20 mg by mouth 2 (two) times daily.        No current facility-administered medications for this visit.    OBJECTIVE: Filed Vitals:   11/29/13 1503  BP: 139/73  Pulse: 101  Temp: 97.7 F (36.5 C)  Resp: 16    Filed Weights   11/29/13 1503  Weight: 145 lb (65.772 kg)   ECOG FS:0 - Asymptomatic Ocular: Sclerae unicteric, pupils equal, round and reactive to light Ear-nose-throat: Oropharynx clear, dentition fair Lymphatic: No cervical or supraclavicular adenopathy Lungs no rales or rhonchi, good excursion bilaterally Heart regular rate and rhythm, no murmur appreciated Abd soft, nontender, positive bowel sounds MSK no focal spinal tenderness, no joint edema Neuro: non-focal, well-oriented, appropriate affect Breasts: Deferred  LAB RESULTS: CMP     Component Value Date/Time   NA 141 11/29/2013 1454   NA 138 03/29/2013 1451   K 3.8 11/29/2013 1454   K 4.0 03/29/2013 1451   CL 98 11/29/2013 1454   CL 100 03/29/2013 1451   CO2 25 11/29/2013 1454   CO2 26 03/29/2013 1451   GLUCOSE 224* 11/29/2013 1454   GLUCOSE 218* 03/29/2013 1451   BUN 12 11/29/2013 1454   BUN 7 03/29/2013 1451   CREATININE 0.8 11/29/2013 1454   CREATININE 0.61 03/29/2013 1451   CALCIUM 9.2 11/29/2013 1454   CALCIUM 9.7 03/29/2013 1451   PROT 7.4 11/29/2013 1454   PROT 7.2 03/29/2013 1451   ALBUMIN 4.6 03/29/2013 1451   AST 26 11/29/2013 1454   AST 29 03/29/2013 1451   ALT 36 11/29/2013 1454   ALT 49* 03/29/2013 1451   ALKPHOS 93* 11/29/2013 1454   ALKPHOS 95 03/29/2013 1451   BILITOT  0.40 11/29/2013 1454   BILITOT 0.4 03/29/2013 1451   GFRNONAA >60 12/17/2007 0915   GFRAA  Value: >60  The eGFR has been calculated using the MDRD equation. This calculation has not been validated in all clinical 12/17/2007 0915   No results found for this basename: SPEP, UPEP,  kappa and lambda light chains   Lab Results  Component Value Date   WBC 9.4 11/29/2013   NEUTROABS 5.4 11/29/2013   HGB 13.0 11/29/2013   HCT 39.3 11/29/2013   MCV 85 11/29/2013   PLT 313 11/29/2013   No results found for this basename: LABCA2   No components found with this basename: LABCA125   No results found for this basename: INR,  in the last 168 hours Urinalysis No results found for this basename: colorurine, appearanceur, labspec, phurine, glucoseu, hgbur, bilirubinur, ketonesur, proteinur, urobilinogen, nitrite, leukocytesur   STUDIES: No results found.  ASSESSMENT/PLAN: Stephanie Carroll is a 58 y.o. female with a history of stage II (T3N0M0) of the sigmoid colon. She had this resected back in September of 2008. She recently had a poly removed that was precancerous after an episode of rectal bleeding. She will have another colonoscopy in 3 years.  Her CBC today was good. We will wait and see what her CEA is.  We will see her back in 8 months for labs and follow-up.  She knows to call here with any questions or concerns and to go to the ED in the event of an emergency. We can certainly see her sooner if need be.   Eliezer Bottom, NP 11/29/2013 4:33 PM

## 2013-11-29 NOTE — Telephone Encounter (Signed)
Spoke with the patient. She is not having the bleeding or pain from hemorrhoids. She only has itching and discomft like a pressure. She has used suppositories in the past. Please advise.

## 2013-11-30 LAB — CEA: CEA: 1.4 ng/mL (ref 0.0–5.0)

## 2013-12-02 MED ORDER — HYDROCORTISONE ACETATE 25 MG RE SUPP: 25.0000 mg | Freq: Two times a day (BID) | RECTAL | Status: DC | PRN

## 2013-12-02 NOTE — Telephone Encounter (Signed)
Left message for pt to call back  °

## 2013-12-02 NOTE — Telephone Encounter (Signed)
If this is been a recurrent or persistent problem band ligation may be helpful.  Otherwise she can continue with suppositories

## 2013-12-02 NOTE — Telephone Encounter (Signed)
Yes.  She can use it for 7-10 days

## 2013-12-02 NOTE — Telephone Encounter (Signed)
I will send a new Rx for the suppositories. Do you use the anusol hc 1PR bid #24? Please advise.

## 2013-12-03 NOTE — Telephone Encounter (Signed)
Patient notified

## 2013-12-04 ENCOUNTER — Encounter: Payer: PRIVATE HEALTH INSURANCE | Admitting: Gastroenterology

## 2014-01-13 ENCOUNTER — Telehealth: Payer: Self-pay | Admitting: *Deleted

## 2014-01-13 NOTE — Telephone Encounter (Signed)
Patient called stating she had an enlarged lymph node diagnosed via [place] on November 24th. She was told that it was benign and to follow up with her PCP in 6 months. Her last mammogram was around September. She wanted to make sure that Dr Marin Olp was aware. Dr Marin Olp stated that if the node is not decreasing in size she needs to follow up with her PCP in three months. I also informed the patient that if the spot increased in size, pain increased, it became hot or started to drain to call her PCP for an immediate follow up. Patient was fine with this and will follow up as indicated.

## 2014-06-16 ENCOUNTER — Telehealth: Payer: Self-pay | Admitting: Gastroenterology

## 2014-06-16 NOTE — Telephone Encounter (Signed)
Patient reports she had a release of blood from her rectum when urinating, not when having a bowel movement. This has happen on 4 occasions over the past week. Her bowel movements are normal for her (denies any change, constipation or diarrhea). She has had "stomach cramps". She was on atb's 1 month ago. Hx of colon cancer. Please advise.

## 2014-06-16 NOTE — Telephone Encounter (Signed)
Patient scheduled for 06/30/14 at 8:45 am. She agrees to this appointment.

## 2014-06-16 NOTE — Telephone Encounter (Signed)
She is having hemorrhoidal bleeding.  Please have her make an office visit where we can discuss band ligation

## 2014-06-27 ENCOUNTER — Encounter: Payer: Self-pay | Admitting: Gastroenterology

## 2014-06-30 ENCOUNTER — Ambulatory Visit (INDEPENDENT_AMBULATORY_CARE_PROVIDER_SITE_OTHER): Payer: PRIVATE HEALTH INSURANCE | Admitting: Gastroenterology

## 2014-06-30 ENCOUNTER — Encounter: Payer: Self-pay | Admitting: Gastroenterology

## 2014-06-30 VITALS — BP 138/70 | HR 100 | Ht 60.25 in | Wt 146.2 lb

## 2014-06-30 DIAGNOSIS — K625 Hemorrhage of anus and rectum: Secondary | ICD-10-CM

## 2014-06-30 DIAGNOSIS — Z85038 Personal history of other malignant neoplasm of large intestine: Secondary | ICD-10-CM | POA: Diagnosis not present

## 2014-06-30 DIAGNOSIS — K648 Other hemorrhoids: Secondary | ICD-10-CM | POA: Diagnosis not present

## 2014-06-30 NOTE — Progress Notes (Signed)
      History of Present Illness:  Stephanie Carroll is again complaining of limited rectal bleeding.  She's had multiple episodes of bright red blood per rectum.  She denies rectal pain.  Colonoscopy in August, 2015 demonstrated an adenomatous polyp and internal hemorrhoids.  Band ligation was recommended.    Review of Systems: Pertinent positive and negative review of systems were noted in the above HPI section. All other review of systems were otherwise negative.    Current Medications, Allergies, Past Medical History, Past Surgical History, Family History and Social History were reviewed in [facility] record  Vital signs were reviewed in today's medical record. Physical Exam: General: Well developed , well nourished, no acute distress   PROCEDURE NOTE: The patient presents with symptomatic grade **2*  hemorrhoids, requesting rubber band ligation of his/her hemorrhoidal disease.  All risks, benefits and alternative forms of therapy were described and informed consent was obtained.   The anorectum was pre-medicated with lubricant and nitroglycerine ointment The decision was made to band the **right posterior* internal hemorrhoid, and the [place] was used to perform band ligation without complication.  Digital anorectal examination was then performed to assure proper positioning of the band, and to adjust the banded tissue as required.  The patient was discharged home without pain or other issues.  Dietary and behavioral recommendations were given and along with follow-up instructions.    The patient will return in *2** weeks for  follow-up and possible additional banding as required. No complications were encountered and the patient tolerated the procedure well.

## 2014-06-30 NOTE — Assessment & Plan Note (Signed)
Per oncology will continue colonoscopy every 3 years

## 2014-06-30 NOTE — Patient Instructions (Signed)
HEMORRHOID BANDING PROCEDURE    FOLLOW-UP CARE   1. The procedure you have had should have been relatively painless since the banding of the area involved does not have nerve endings and there is no pain sensation.  The rubber band cuts off the blood supply to the hemorrhoid and the band may fall off as soon as 48 hours after the banding (the band may occasionally be seen in the toilet bowl following a bowel movement). You may notice a temporary feeling of fullness in the rectum which should respond adequately to plain Tylenol or Motrin.  2. Following the banding, avoid strenuous exercise that evening and resume full activity the next day.  A sitz bath (soaking in a warm tub) or bidet is soothing, and can be useful for cleansing the area after bowel movements.     3. To avoid constipation, take two tablespoons of natural wheat bran, natural oat bran, flax, Benefiber or any over the counter fiber supplement and increase your water intake to 7-8 glasses daily.    4. Unless you have been prescribed anorectal medication, do not put anything inside your rectum for two weeks: No suppositories, enemas, fingers, etc.  5. Occasionally, you may have more bleeding than usual after the banding procedure.  This is often from the untreated hemorrhoids rather than the treated one.  Don't be concerned if there is a tablespoon or so of blood.  If there is more blood than this, lie flat with your bottom higher than your head and apply an ice pack to the area. If the bleeding does not stop within a half an hour or if you feel faint, call our office at [phone] or go to the emergency room.  6. Problems are not common; however, if there is a substantial amount of bleeding, severe pain, chills, fever or difficulty passing urine (very rare) or other problems, you should call us at [phone] or report to the nearest emergency room.  7. Do not stay seated continuously for more than 2-3 hours for a day or two  after the procedure.  Tighten your buttock muscles 10-15 times every two hours and take 10-15 deep breaths every 1-2 hours.  Do not spend more than a few minutes on the toilet if you cannot empty your bowel; instead re-visit the toilet at a later time.     You are scheduled for your 2nd banding with Dr Deatra Ina 09/10/2014 at 8:30 am.

## 2014-07-31 ENCOUNTER — Other Ambulatory Visit: Payer: Self-pay | Admitting: *Deleted

## 2014-07-31 DIAGNOSIS — C182 Malignant neoplasm of ascending colon: Secondary | ICD-10-CM

## 2014-08-01 ENCOUNTER — Other Ambulatory Visit (HOSPITAL_BASED_OUTPATIENT_CLINIC_OR_DEPARTMENT_OTHER): Payer: PRIVATE HEALTH INSURANCE

## 2014-08-01 ENCOUNTER — Ambulatory Visit (HOSPITAL_BASED_OUTPATIENT_CLINIC_OR_DEPARTMENT_OTHER): Payer: PRIVATE HEALTH INSURANCE | Admitting: Hematology & Oncology

## 2014-08-01 VITALS — BP 128/73 | HR 110 | Temp 97.7°F | Resp 20 | Wt 144.0 lb

## 2014-08-01 DIAGNOSIS — E119 Type 2 diabetes mellitus without complications: Secondary | ICD-10-CM

## 2014-08-01 DIAGNOSIS — Z85038 Personal history of other malignant neoplasm of large intestine: Secondary | ICD-10-CM

## 2014-08-01 DIAGNOSIS — C182 Malignant neoplasm of ascending colon: Secondary | ICD-10-CM

## 2014-08-01 LAB — COMPREHENSIVE METABOLIC PANEL
ALT: 58 U/L — ABNORMAL HIGH (ref 0–35)
AST: 53 U/L — AB (ref 0–37)
Albumin: 4.4 g/dL (ref 3.5–5.2)
Alkaline Phosphatase: 100 U/L (ref 39–117)
BUN: 8 mg/dL (ref 6–23)
CO2: 22 mEq/L (ref 19–32)
Calcium: 9.7 mg/dL (ref 8.4–10.5)
Chloride: 98 mEq/L (ref 96–112)
Creatinine, Ser: 0.63 mg/dL (ref 0.50–1.10)
GLUCOSE: 200 mg/dL — AB (ref 70–99)
POTASSIUM: 4.7 meq/L (ref 3.5–5.3)
Sodium: 136 mEq/L (ref 135–145)
Total Bilirubin: 0.4 mg/dL (ref 0.2–1.2)
Total Protein: 7.8 g/dL (ref 6.0–8.3)

## 2014-08-01 LAB — CBC WITH DIFFERENTIAL (CANCER CENTER ONLY)
BASO#: 0.1 10*3/uL (ref 0.0–0.2)
BASO%: 1.3 % (ref 0.0–2.0)
EOS ABS: 0.2 10*3/uL (ref 0.0–0.5)
EOS%: 1.8 % (ref 0.0–7.0)
HCT: 41.2 % (ref 34.8–46.6)
HGB: 13.8 g/dL (ref 11.6–15.9)
LYMPH#: 3.5 10*3/uL — ABNORMAL HIGH (ref 0.9–3.3)
LYMPH%: 35.3 % (ref 14.0–48.0)
MCH: 28 pg (ref 26.0–34.0)
MCHC: 33.5 g/dL (ref 32.0–36.0)
MCV: 84 fL (ref 81–101)
MONO#: 0.6 10*3/uL (ref 0.1–0.9)
MONO%: 6.1 % (ref 0.0–13.0)
NEUT%: 55.5 % (ref 39.6–80.0)
NEUTROS ABS: 5.5 10*3/uL (ref 1.5–6.5)
Platelets: 359 10*3/uL (ref 145–400)
RBC: 4.92 10*6/uL (ref 3.70–5.32)
RDW: 13.9 % (ref 11.1–15.7)
WBC: 10 10*3/uL (ref 3.9–10.0)

## 2014-08-01 LAB — CEA: CEA: 1.5 ng/mL (ref 0.0–5.0)

## 2014-08-01 NOTE — Progress Notes (Signed)
[place]  [phone]) [phone] [phone]) [phone]  ID: Renesme Kerrigan Otte OB: Aug 17, 1955 MR#: [id number] KGY#:[id number] Patient Care Team: Josephina Shih, RN as PCP - General  DIAGNOSIS: Stage II carcinoma of colon  INTERVAL HISTORY: Ms. Jaster comes is here today for a follow-up. She is feeling pretty good. She is stressed out at work. She works for an [employer]. This is really been a stress and struggle for her.  Her husband recently had knee surgery. She is trying to help him get through this. He is doing some rehabilitation right now.  She has had no change in her bowel or bladder habits. She has had no issues with nausea or vomiting. She's had no issues with leg swelling. She's had no rashes.  Her last CEA back in October 2015 was 1.4.  Her last colonoscopy was back in August 2015. Shave polyp that was removed. This was benign. \  Her appetite has been okay. She's had no fever. She's had no cough or shortness of breath.  Overall, her performance status is ECOG 1.   CURRENT TREATMENT: Observation  REVIEW OF SYSTEMS: All other 10 point review of systems is negative.   PAST MEDICAL HISTORY: Past Medical History  Diagnosis Date  . Colon cancer 2008  . Asthma   . Diabetes mellitus   . GERD (gastroesophageal reflux disease)   . Arthritis     DDD  . Thyroid tumor, benign     PAST SURGICAL HISTORY: Past Surgical History  Procedure Laterality Date  . Colon surgery    . Partial hysterectomy    . Incisional hernia repair    . Nasal septum surgery    . Cesarean section      x 1  . Wrist ganglion excision      left  . Polypectomy    . Upper gastrointestinal endoscopy    . Biopsy thyroid      X2    FAMILY HISTORY Family History  Problem Relation Age of Onset  . Colon cancer Neg Hx   . COPD      both sides  . Diabetes      both sides  . Heart disease Father   . Lung cancer Paternal Uncle   . Leukemia Paternal Grandfather   . Cervical  cancer Paternal Aunt     x 2   . Heart defect Mother     MVP  . Kidney failure Father     GYNECOLOGIC HISTORY:  No LMP recorded. Patient has had a hysterectomy.   SOCIAL HISTORY: History   Social History  . Marital Status: Married    Spouse Name: N/A  . Number of Children: 1  . Years of Education: N/A   Occupational History  . claims for [employer]    Social History Main Topics  . Smoking status: Never Smoker   . Smokeless tobacco: Never Used     Comment: never used product  . Alcohol Use: No  . Drug Use: No  . Sexual Activity: Not on file   Other Topics Concern  . Not on file   Social History Narrative    ADVANCED DIRECTIVES:  <no information>  HEALTH MAINTENANCE: History  Substance Use Topics  . Smoking status: Never Smoker   . Smokeless tobacco: Never Used     Comment: never used product  . Alcohol Use: No   Colonoscopy: PAP: Bone density: Lipid panel:  Allergies  Allergen Reactions  . Fluticasone-Salmeterol   . Metronidazole   .  Morphine     REACTION: heart racing  . Other     Oats and Green peppers---headaches  . Peanuts [Peanut Oil] Other (See Comments)    congested  . Penicillins     REACTION: rash  . Sulfonamide Derivatives     Current Outpatient Prescriptions  Medication Sig Dispense Refill  . albuterol (PROVENTIL HFA;VENTOLIN HFA) 108 (90 BASE) MCG/ACT inhaler Inhale 1 puff into the lungs every 6 (six) hours as needed. For shortness for breath    . cetirizine (ZYRTEC ALLERGY) 10 MG tablet Take 10 mg by mouth daily.      Marland Kitchen guaiFENesin 200 MG tablet Take 400 mg by mouth 2 (two) times daily.    Marland Kitchen LORazepam (ATIVAN) 0.5 MG tablet Take 0.5 mg by mouth every 8 (eight) hours as needed. For anxiety    . metFORMIN (GLUCOPHAGE-XR) 500 MG 24 hr tablet 500 mg 2 (two) times daily.    . mometasone (ASMANEX) 220 MCG/INH inhaler Inhale 1 puff into the lungs 2 (two) times daily.      Marland Kitchen OVER THE COUNTER MEDICATION daily.    . RABEprazole (ACIPHEX) 20  MG tablet Take 20 mg by mouth 2 (two) times daily.      No current facility-administered medications for this visit.    OBJECTIVE: Filed Vitals:   08/01/14 1511  BP: 128/73  Pulse: 110  Temp: 97.7 F (36.5 C)  Resp: 20    Filed Weights   08/01/14 1511  Weight: 144 lb (65.319 kg)   ECOG FS:0 - Asymptomatic Well-developed and well-nourished white female in no obvious distress. Head and neck exam shows no ocular or oral lesions. There are no palpable cervical or supraclavicular lymph nodes. Lungs are clear. Cardiac exam regular rate and rhythm with no murmurs, rubs or bruits. Abdomen is soft. She has good bowel sounds. There is no fluid wave. There is no palpable liver or spleen tip. She has well-healed laparotomy scar. Back exam shows no tenderness over the spine, ribs or hips. Externally shows no clubbing, cyanosis or edema. Neurological exam shows no focal neurological deficits. Skin exam shows no rashes, ecchymoses or petechia.  LAB RESULTS: CMP     Component Value Date/Time   NA 141 11/29/2013 1454   NA 138 03/29/2013 1451   K 3.8 11/29/2013 1454   K 4.0 03/29/2013 1451   CL 98 11/29/2013 1454   CL 100 03/29/2013 1451   CO2 25 11/29/2013 1454   CO2 26 03/29/2013 1451   GLUCOSE 224* 11/29/2013 1454   GLUCOSE 218* 03/29/2013 1451   BUN 12 11/29/2013 1454   BUN 7 03/29/2013 1451   CREATININE 0.8 11/29/2013 1454   CREATININE 0.61 03/29/2013 1451   CALCIUM 9.2 11/29/2013 1454   CALCIUM 9.7 03/29/2013 1451   PROT 7.4 11/29/2013 1454   PROT 7.2 03/29/2013 1451   ALBUMIN 4.6 03/29/2013 1451   AST 26 11/29/2013 1454   AST 29 03/29/2013 1451   ALT 36 11/29/2013 1454   ALT 49* 03/29/2013 1451   ALKPHOS 93* 11/29/2013 1454   ALKPHOS 95 03/29/2013 1451   BILITOT 0.40 11/29/2013 1454   BILITOT 0.4 03/29/2013 1451   GFRNONAA >60 12/17/2007 0915   GFRAA  12/17/2007 0915    >60        The eGFR has been calculated using the MDRD equation. This calculation has not  been validated in all clinical   No results found for: SPEP Lab Results  Component Value Date   WBC 10.0 08/01/2014  NEUTROABS 5.5 08/01/2014   HGB 13.8 08/01/2014   HCT 41.2 08/01/2014   MCV 84 08/01/2014   PLT 359 08/01/2014   No results found for: LABCA2 No components found for: [id number] No results for input(s): INR in the last 168 hours. Urinalysis No results found for: COLORURINE STUDIES: No results found.  ASSESSMENT/PLAN: Ms. Warshawsky is a 59 y.o. female with a history of stage II (T3N0M0) of the sigmoid colon. She had this resected back in September of 2008. She did not require any adjuvant therapy.  I do not see any evidence of recurrent disease. She still worried about the risk of recurrence. I think that after 8 years, the risk of recurrence will be less than 5%.   We are following her CEA level. We will see what it is.  I think her biggest issue will be diabetes. I keep talking to her about this.  We will see her back in 8 months.   Volanda Napoleon, MD 08/01/2014 4:10 PM

## 2014-08-04 ENCOUNTER — Telehealth: Payer: Self-pay | Admitting: *Deleted

## 2014-08-04 NOTE — Telephone Encounter (Addendum)
Patient aware of results  ----- Message from Volanda Napoleon, MD sent at 08/04/2014  3:12 PM EDT ----- Call - CEA level is ok!!  Blood sugar is too high!!!  Laurey Arrow

## 2014-09-10 ENCOUNTER — Encounter: Payer: PRIVATE HEALTH INSURANCE | Admitting: Gastroenterology

## 2014-11-05 ENCOUNTER — Other Ambulatory Visit: Payer: Self-pay | Admitting: Physician Assistant

## 2015-02-15 HISTORY — PX: FOOT FRACTURE SURGERY: SHX645

## 2015-03-23 DIAGNOSIS — D649 Anemia, unspecified: Secondary | ICD-10-CM | POA: Insufficient documentation

## 2015-03-23 DIAGNOSIS — E040 Nontoxic diffuse goiter: Secondary | ICD-10-CM | POA: Insufficient documentation

## 2015-03-23 DIAGNOSIS — M19049 Primary osteoarthritis, unspecified hand: Secondary | ICD-10-CM | POA: Insufficient documentation

## 2015-03-23 DIAGNOSIS — R748 Abnormal levels of other serum enzymes: Secondary | ICD-10-CM | POA: Insufficient documentation

## 2015-03-23 DIAGNOSIS — Z9109 Other allergy status, other than to drugs and biological substances: Secondary | ICD-10-CM | POA: Insufficient documentation

## 2015-03-23 DIAGNOSIS — J219 Acute bronchiolitis, unspecified: Secondary | ICD-10-CM | POA: Insufficient documentation

## 2015-03-23 DIAGNOSIS — M26609 Unspecified temporomandibular joint disorder, unspecified side: Secondary | ICD-10-CM | POA: Insufficient documentation

## 2015-03-23 DIAGNOSIS — E119 Type 2 diabetes mellitus without complications: Secondary | ICD-10-CM | POA: Insufficient documentation

## 2015-03-23 DIAGNOSIS — N63 Unspecified lump in unspecified breast: Secondary | ICD-10-CM | POA: Insufficient documentation

## 2015-03-23 DIAGNOSIS — M2550 Pain in unspecified joint: Secondary | ICD-10-CM | POA: Insufficient documentation

## 2015-03-23 DIAGNOSIS — M069 Rheumatoid arthritis, unspecified: Secondary | ICD-10-CM | POA: Insufficient documentation

## 2015-03-23 DIAGNOSIS — F419 Anxiety disorder, unspecified: Secondary | ICD-10-CM | POA: Insufficient documentation

## 2015-03-31 DIAGNOSIS — B001 Herpesviral vesicular dermatitis: Secondary | ICD-10-CM | POA: Insufficient documentation

## 2015-03-31 DIAGNOSIS — M545 Low back pain, unspecified: Secondary | ICD-10-CM | POA: Insufficient documentation

## 2015-04-03 ENCOUNTER — Other Ambulatory Visit: Payer: PRIVATE HEALTH INSURANCE

## 2015-04-03 ENCOUNTER — Ambulatory Visit: Payer: PRIVATE HEALTH INSURANCE | Admitting: Hematology & Oncology

## 2015-04-22 ENCOUNTER — Encounter: Payer: Self-pay | Admitting: Family

## 2015-04-22 ENCOUNTER — Ambulatory Visit (HOSPITAL_BASED_OUTPATIENT_CLINIC_OR_DEPARTMENT_OTHER): Payer: PRIVATE HEALTH INSURANCE | Admitting: Family

## 2015-04-22 ENCOUNTER — Other Ambulatory Visit (HOSPITAL_BASED_OUTPATIENT_CLINIC_OR_DEPARTMENT_OTHER): Payer: PRIVATE HEALTH INSURANCE

## 2015-04-22 VITALS — BP 143/68 | HR 106 | Temp 97.7°F | Resp 18 | Ht 60.0 in | Wt 145.0 lb

## 2015-04-22 DIAGNOSIS — Z85038 Personal history of other malignant neoplasm of large intestine: Secondary | ICD-10-CM | POA: Diagnosis not present

## 2015-04-22 DIAGNOSIS — C182 Malignant neoplasm of ascending colon: Secondary | ICD-10-CM

## 2015-04-22 LAB — CBC WITH DIFFERENTIAL (CANCER CENTER ONLY)
BASO#: 0.1 10*3/uL (ref 0.0–0.2)
BASO%: 0.6 % (ref 0.0–2.0)
EOS%: 2.4 % (ref 0.0–7.0)
Eosinophils Absolute: 0.3 10*3/uL (ref 0.0–0.5)
HCT: 38.7 % (ref 34.8–46.6)
HGB: 12.7 g/dL (ref 11.6–15.9)
LYMPH#: 3.6 10*3/uL — ABNORMAL HIGH (ref 0.9–3.3)
LYMPH%: 31.2 % (ref 14.0–48.0)
MCH: 27.3 pg (ref 26.0–34.0)
MCHC: 32.8 g/dL (ref 32.0–36.0)
MCV: 83 fL (ref 81–101)
MONO#: 0.7 10*3/uL (ref 0.1–0.9)
MONO%: 5.7 % (ref 0.0–13.0)
NEUT#: 7 10*3/uL — ABNORMAL HIGH (ref 1.5–6.5)
NEUT%: 60.1 % (ref 39.6–80.0)
PLATELETS: 290 10*3/uL (ref 145–400)
RBC: 4.66 10*6/uL (ref 3.70–5.32)
RDW: 14.2 % (ref 11.1–15.7)
WBC: 11.7 10*3/uL — AB (ref 3.9–10.0)

## 2015-04-22 LAB — COMPREHENSIVE METABOLIC PANEL (CC13)
ALK PHOS: 96 IU/L (ref 39–117)
ALT: 65 IU/L — AB (ref 0–32)
AST (SGOT): 51 IU/L — ABNORMAL HIGH (ref 0–40)
Albumin, Serum: 4.9 g/dL (ref 3.5–5.5)
Albumin/Globulin Ratio: 1.4 (ref 1.1–2.5)
BILIRUBIN TOTAL: 0.3 mg/dL (ref 0.0–1.2)
BUN/Creatinine Ratio: 17 (ref 9–23)
BUN: 11 mg/dL (ref 6–24)
CHLORIDE: 96 mmol/L (ref 96–106)
CO2: 23 mmol/L (ref 18–29)
CREATININE: 0.63 mg/dL (ref 0.57–1.00)
Calcium, Ser: 10.1 mg/dL (ref 8.7–10.2)
GFR calc non Af Amer: 100 mL/min/{1.73_m2} (ref >59)
GFR, EST AFRICAN AMERICAN: 115 mL/min/{1.73_m2} (ref >59)
GLUCOSE: 222 mg/dL — AB (ref 65–99)
Globulin, Total: 3.6 g/dL (ref 1.5–4.5)
Potassium, Ser: 3.6 mmol/L (ref 3.5–5.2)
Sodium: 133 mmol/L — ABNORMAL LOW (ref 134–144)
TOTAL PROTEIN: 8.5 g/dL (ref 6.0–8.5)

## 2015-04-22 NOTE — Progress Notes (Signed)
Hematology and Oncology Follow Up Visit  Stephanie Carroll [id number] 08-03-1955 60 y.o. 04/22/2015   Principle Diagnosis:  Stage II carcinoma of colon  Current Therapy:   Observation    Interim History:  Stephanie Carroll is here today for a follow-up. Unfortunately she fell and broke her left ankle several days after Christmas and is still recuperating. She had surgery right after and now has a plate and pins in. She started PT on Monday and will follow-up with her orthopedist this week to see if she is ready to weight on it. She is currently wearing a boot and using a knee walker She states that she had several "bumps" removed from her left leg which ended up being benign.  She has had no fever, chills, n/v, cough,r ash, dizziness, SOB, chest pain, palpitations, abdominal pain or changes in bowel or bladder habits. No constipation or diarrhea.  She states that she has hemorrhoids and has had them banded in the past. She has had spots of blood on her toilet tissue in the past due to this. No recent episodes.   Her most recent colonoscopy was in August 2015 where a polyp was removed and she was found to have internal hemorrhoids.  Her CEA in June of last year was 1.5.  She is diabetic and states that her blood sugars have been fairly well controlled. She has started back on Plexus which previously helped lower her Hgb A1c.  No numbness or tingling in her extremities.  She has maintained a good appetite and is staying well hydrated. She did not want to be weighed  today because of the boot and she is unable to bear weight on that foot.   Medications:    Medication List       This list is accurate as of: 04/22/15  2:52 PM.  Always use your most recent med list.               ACIPHEX 20 MG tablet  Generic drug:  RABEprazole  Take 20 mg by mouth 2 (two) times daily.     albuterol 108 (90 Base) MCG/ACT inhaler  Commonly known as:  PROVENTIL HFA;VENTOLIN HFA  Inhale 1 puff into the lungs every 6  (six) hours as needed. For shortness for breath     guaiFENesin 200 MG tablet  Take 400 mg by mouth 2 (two) times daily.     LORazepam 0.5 MG tablet  Commonly known as:  ATIVAN  Take 0.5 mg by mouth every 8 (eight) hours as needed. For anxiety     metFORMIN 500 MG 24 hr tablet  Commonly known as:  GLUCOPHAGE-XR  500 mg 2 (two) times daily.     mometasone 220 MCG/INH inhaler  Commonly known as:  ASMANEX  Inhale 1 puff into the lungs 2 (two) times daily.     OVER THE COUNTER MEDICATION  daily.     ZYRTEC ALLERGY 10 MG tablet  Generic drug:  cetirizine  Take 10 mg by mouth daily.        Allergies:  Allergies  Allergen Reactions  . Fluticasone-Salmeterol   . Metronidazole   . Morphine     REACTION: heart racing  . Other     Oats and Green peppers---headaches  . Peanuts [Peanut Oil] Other (See Comments)    congested  . Penicillins     REACTION: rash  . Sulfonamide Derivatives     Past Medical History, Surgical history, Social history, and Family History were reviewed  and updated.  Review of Systems: All other 10 point review of systems is negative.   Physical Exam:  vitals were not taken for this visit.  Wt Readings from Last 3 Encounters:  08/01/14 144 lb (65.319 kg)  06/30/14 146 lb 4 oz (66.339 kg)  11/29/13 145 lb (65.772 kg)    Ocular: Sclerae unicteric, pupils equal, round and reactive to light Ear-nose-throat: Oropharynx clear, dentition fair Lymphatic: No cervical supraclavicular or axillary adenopathy Lungs no rales or rhonchi, good excursion bilaterally Heart regular rate and rhythm, no murmur appreciated Abd soft, nontender, positive bowel sounds, no liver or spleen tip palpated on exam MSK no focal spinal tenderness, no joint edema Neuro: non-focal, well-oriented, appropriate affect Breasts: Deferred  Lab Results  Component Value Date   WBC 10.0 08/01/2014   HGB 13.8 08/01/2014   HCT 41.2 08/01/2014   MCV 84 08/01/2014   PLT 359  08/01/2014   Lab Results  Component Value Date   FERRITIN 201.3 08/08/2008   IRON 58 08/08/2008   IRONPCTSAT 13.3* 08/08/2008   Lab Results  Component Value Date   RETICCTPCT 2.0 03/01/2007   RBC 4.92 08/01/2014   RETICCTABS 101.4 03/01/2007   No results found for: KPAFRELGTCHN, LAMBDASER, KAPLAMBRATIO No results found for: IGGSERUM, IGA, IGMSERUM No results found for: Odetta Pink, SPEI   Chemistry      Component Value Date/Time   NA 136 08/01/2014 1450   NA 141 11/29/2013 1454   K 4.7 08/01/2014 1450   K 3.8 11/29/2013 1454   CL 98 08/01/2014 1450   CL 98 11/29/2013 1454   CO2 22 08/01/2014 1450   CO2 25 11/29/2013 1454   BUN 8 08/01/2014 1450   BUN 12 11/29/2013 1454   CREATININE 0.63 08/01/2014 1450   CREATININE 0.8 11/29/2013 1454      Component Value Date/Time   CALCIUM 9.7 08/01/2014 1450   CALCIUM 9.2 11/29/2013 1454   ALKPHOS 100 08/01/2014 1450   ALKPHOS 93* 11/29/2013 1454   AST 53* 08/01/2014 1450   AST 26 11/29/2013 1454   ALT 58* 08/01/2014 1450   ALT 36 11/29/2013 1454   BILITOT 0.4 08/01/2014 1450   BILITOT 0.40 11/29/2013 1454     Impression and Plan: Stephanie Carroll is a 60 yo white female with a history of 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 She did not require any adjuvant therapy. She has done well since then and so far there has been no evidence of recurrence. She is asymptomatic at this time. No anemia.  Her CEA has remained stable. Her results today are pending.  She will be due again for her colonoscopy next year.  We will plan to see her back in 1 year for lab work and follow-up.  She will contact us with any questions or concerns. We can certainly see her sooner of need be.   Eliezer Bottom, NP 3/8/20172:52 PM

## 2015-04-23 LAB — CEA: CEA1: 2.6 ng/mL (ref 0.0–4.7)

## 2015-04-23 LAB — CEA (PARALLEL TESTING): CEA: 1.4 ng/mL

## 2015-05-04 ENCOUNTER — Encounter (INDEPENDENT_AMBULATORY_CARE_PROVIDER_SITE_OTHER): Payer: Self-pay

## 2015-05-18 DIAGNOSIS — R0781 Pleurodynia: Secondary | ICD-10-CM | POA: Insufficient documentation

## 2015-06-18 ENCOUNTER — Telehealth: Payer: Self-pay | Admitting: Gastroenterology

## 2015-06-19 ENCOUNTER — Other Ambulatory Visit: Payer: Self-pay

## 2015-06-19 MED ORDER — RABEPRAZOLE SODIUM 20 MG PO TBEC: 20.0000 mg | DELAYED_RELEASE_TABLET | Freq: Two times a day (BID) | ORAL | Status: DC

## 2015-06-19 NOTE — Telephone Encounter (Signed)
Aciphex rx'd to [facility] in [place]. Patient is due follow up appointment. She would like to see Dr Silverio Decamp. Appointment scheduled for 07/24/15.  Will await the rejection claim from her pharmacy.

## 2015-06-23 ENCOUNTER — Other Ambulatory Visit: Payer: Self-pay

## 2015-06-23 ENCOUNTER — Telehealth: Payer: Self-pay | Admitting: Gastroenterology

## 2015-06-23 MED ORDER — RABEPRAZOLE SODIUM 20 MG PO TBEC: 20.0000 mg | DELAYED_RELEASE_TABLET | Freq: Every day | ORAL | Status: DC

## 2015-06-23 NOTE — Telephone Encounter (Signed)
Appointment to establish with you is scheduled. She needs a refill on Aciohex. She has been taking it BID. Insurance will not cover it as a BID without a prior authorization. May I change it to once daily until she is seen by you?

## 2015-06-23 NOTE — Telephone Encounter (Signed)
Ok thanks 

## 2015-06-29 ENCOUNTER — Other Ambulatory Visit: Payer: Self-pay

## 2015-06-29 MED ORDER — RABEPRAZOLE SODIUM 20 MG PO TBEC: 20.0000 mg | DELAYED_RELEASE_TABLET | Freq: Every day | ORAL | Status: DC

## 2015-06-29 NOTE — Telephone Encounter (Signed)
Patient is calling back regarding this medication. Best # [phone]

## 2015-06-29 NOTE — Telephone Encounter (Signed)
Advised the patient the PA on Aciphex BID was unsuccessful. The documentation needed was not in the chart. She will keep the upcoming appointment and continue the Aciphex once daily.

## 2015-06-30 ENCOUNTER — Telehealth: Payer: Self-pay | Admitting: Gastroenterology

## 2015-06-30 NOTE — Telephone Encounter (Addendum)
[name] Was you working on this for patient? I dont have a prior auth for her

## 2015-06-30 NOTE — Telephone Encounter (Signed)
I thought we couldn't get the prior authorization approval. I told her to address it with Dr Silverio Decamp. If it is approved, she needs an update on the prescription. Thanks

## 2015-07-03 DIAGNOSIS — S82843A Displaced bimalleolar fracture of unspecified lower leg, initial encounter for closed fracture: Secondary | ICD-10-CM | POA: Insufficient documentation

## 2015-07-09 MED ORDER — RABEPRAZOLE SODIUM 20 MG PO TBEC: 20.0000 mg | DELAYED_RELEASE_TABLET | Freq: Every day | ORAL | Status: DC

## 2015-07-09 NOTE — Telephone Encounter (Signed)
Resent in script to see if it will be approved, I will look out for prior auth request

## 2015-07-24 ENCOUNTER — Ambulatory Visit (INDEPENDENT_AMBULATORY_CARE_PROVIDER_SITE_OTHER): Payer: PRIVATE HEALTH INSURANCE | Admitting: Gastroenterology

## 2015-07-24 ENCOUNTER — Encounter: Payer: Self-pay | Admitting: Gastroenterology

## 2015-07-24 VITALS — BP 140/60 | HR 108 | Ht 60.25 in | Wt 145.1 lb

## 2015-07-24 DIAGNOSIS — R140 Abdominal distension (gaseous): Secondary | ICD-10-CM | POA: Diagnosis not present

## 2015-07-24 DIAGNOSIS — K625 Hemorrhage of anus and rectum: Secondary | ICD-10-CM | POA: Diagnosis not present

## 2015-07-24 DIAGNOSIS — K648 Other hemorrhoids: Secondary | ICD-10-CM

## 2015-07-24 DIAGNOSIS — K219 Gastro-esophageal reflux disease without esophagitis: Secondary | ICD-10-CM

## 2015-07-24 DIAGNOSIS — R143 Flatulence: Secondary | ICD-10-CM

## 2015-07-24 MED ORDER — HYDROCORTISONE 2.5 % RE CREA: 1.0000 "application " | TOPICAL_CREAM | Freq: Two times a day (BID) | RECTAL | Status: DC

## 2015-07-24 MED ORDER — RABEPRAZOLE SODIUM 20 MG PO TBEC: 20.0000 mg | DELAYED_RELEASE_TABLET | Freq: Two times a day (BID) | ORAL | Status: DC

## 2015-07-24 NOTE — Progress Notes (Signed)
Stephanie Carroll    [id number]    1955/08/22  Primary Care Physician:Pasquini, Rosilyn Mings, RN  Referring Physician: No referring provider defined for this encounter.  Chief complaint: GERD , bright red blood per rectum   HPI:  60 year old female with history of colon cancer diagnosed in 2008 status post segmental resection of ascending colon, last colonoscopy in August 2015 previously followed by Dr. Deatra Ina is here for follow-up visit. Patient complains of intermittent bright red blood per rectum, she has known internal hemorrhoids, a 6 mm polyp tubular adenoma was removed at the site of anastomosis. She did have hemorrhoidal band ligation done in May 2016 x1, patient didn't notice any improvement and hasn't undergone any further band ligation. She also has history of chronic GERD and is on AcipHex twice daily. She has tried different PPIs in the past and feels on the AcipHex has controlled her symptoms. Patient also complains of increased burping, bloating and foul-smelling flatus. Denies any nausea, vomiting, abdominal pain, or melena.    Outpatient Encounter Prescriptions as of 07/24/2015  Medication Sig  . albuterol (PROVENTIL HFA;VENTOLIN HFA) 108 (90 BASE) MCG/ACT inhaler Inhale 1 puff into the lungs every 6 (six) hours as needed. For shortness for breath  . cetirizine (ZYRTEC ALLERGY) 10 MG tablet Take 10 mg by mouth daily.    Marland Kitchen guaiFENesin 200 MG tablet Take 400 mg by mouth 2 (two) times daily.  Marland Kitchen LORazepam (ATIVAN) 0.5 MG tablet Take 0.5 mg by mouth every 8 (eight) hours as needed. For anxiety  . metFORMIN (GLUCOPHAGE-XR) 500 MG 24 hr tablet Take 1,000 mg by mouth 2 (two) times daily.  . mometasone (ASMANEX) 220 MCG/INH inhaler Inhale 1 puff into the lungs 2 (two) times daily.    Marland Kitchen OVER THE COUNTER MEDICATION daily.  . Probiotic Product (ACIDOPHILUS/GOAT MILK) CAPS Take by mouth.  . RABEprazole (ACIPHEX) 20 MG tablet Take 1 tablet (20 mg total) by mouth daily.  .  hydrocortisone (ANUSOL-HC) 2.5 % rectal cream Place 1 application rectally 2 (two) times daily.  . RABEprazole (ACIPHEX) 20 MG tablet Take 1 tablet (20 mg total) by mouth 2 (two) times daily.  . [DISCONTINUED] metFORMIN (GLUCOPHAGE-XR) 500 MG 24 hr tablet 500 mg 2 (two) times daily.   No facility-administered encounter medications on file as of 07/24/2015.    Allergies as of 07/24/2015 - Review Complete 07/24/2015  Allergen Reaction Noted  . Fluticasone-salmeterol  09/28/2007  . Metronidazole  08/27/2009  . Morphine  09/28/2007  . Other  10/14/2011  . Peanuts [peanut oil] Other (See Comments) 10/14/2011  . Penicillins  09/28/2007  . Sulfonamide derivatives      Past Medical History  Diagnosis Date  . Colon cancer ([place]) 2008  . Asthma   . Diabetes mellitus   . GERD (gastroesophageal reflux disease)   . Arthritis     DDD  . Thyroid tumor, benign     Past Surgical History  Procedure Laterality Date  . Colon surgery    . Partial hysterectomy    . Incisional hernia repair    . Nasal septum surgery    . Cesarean section      x 1  . Wrist ganglion excision      left  . Polypectomy    . Upper gastrointestinal endoscopy    . Biopsy thyroid      X2  . Foot fracture surgery Left 02/2015    Family History  Problem Relation Age  of Onset  . Colon cancer Neg Hx   . COPD      both sides  . Diabetes      both sides  . Heart disease Father   . Lung cancer Paternal Uncle   . Leukemia Paternal Grandfather   . Cervical cancer Paternal Aunt     x 2   . Heart defect Mother     MVP  . Kidney failure Father     Social History   Social History  . Marital Status: Married    Spouse Name: N/A  . Number of Children: 1  . Years of Education: N/A   Occupational History  . claims for [employer]    Social History Main Topics  . Smoking status: Never Smoker   . Smokeless tobacco: Never Used     Comment: never used product  . Alcohol Use: No  . Drug Use: No  . Sexual Activity:  Not on file   Other Topics Concern  . Not on file   Social History Narrative      Review of systems: Review of Systems  Constitutional: Negative for fever and chills.  HENT: Negative.   Eyes: Negative for blurred vision.  Respiratory: Negative for cough, shortness of breath and wheezing.   Cardiovascular: Negative for chest pain and palpitations.  Gastrointestinal: as per HPI Genitourinary: Negative for dysuria, urgency, frequency and hematuria.  Musculoskeletal: Negative for myalgias, back pain and joint pain.  Skin: Negative for itching and rash.  Neurological: Negative for dizziness, tremors, focal weakness, seizures and loss of consciousness.  Endo/Heme/Allergies: Negative for environmental allergies.  Psychiatric/Behavioral: Negative for depression, suicidal ideas and hallucinations.  All other systems reviewed and are negative.   Physical Exam: Filed Vitals:   07/24/15 1521  BP: 140/60  Pulse: 108   Gen:      No acute distress HEENT:  EOMI, sclera anicteric Neck:     No masses; no thyromegaly Lungs:    Clear to auscultation bilaterally; normal respiratory effort CV:         Regular rate and rhythm; no murmurs Abd:      + bowel sounds; soft, non-tender; no palpable masses, no distension Ext:    No edema; adequate peripheral perfusion Skin:      Warm and dry; no rash Neuro: alert and oriented x 3 Psych: normal mood and affect  Data Reviewed:   Reviewed chart in epic   Assessment and Plan/Recommendations:  60 year old female with history of colon cancer diagnosed in 2008, last colonoscopy in August 2015 with removal of 1 tubular adenoma at the site of anastomosis and internal hemorrhoids here with complaints of intermittent bright red blood per rectum. Patient has had history of chronic internal hemorrhoids and intermittent small volume hemorrhoidal hemorrhage. She did have hemorrhoidal band ligation 1 and did not want to pursue it any further as she hasn't  noticed much difference Anusol cream per rectum twice daily as needed Due for surveillance colonoscopy in August 2018  Chronic GERD: Continue AcipHex twice daily and antireflux measures Trial of FD Guard twice daily as needed  Bloating, foul smelling flatus: VSL#3 112 B units 1 capsule daily Return in 1 year or sooner if needed  K. Denzil Magnuson , MD [phone] Mon-Fri 8a-5p [phone] after 5p, weekends, holidays  CC: No ref. provider found

## 2015-07-24 NOTE — Patient Instructions (Signed)
Aciphex twice a day Use FD Guard Anusol cream will be sent to your pharmacy VSL#3 1 capsule daily  Follow up in 1 year

## 2015-07-27 ENCOUNTER — Telehealth: Payer: Self-pay | Admitting: Gastroenterology

## 2015-07-27 NOTE — Telephone Encounter (Signed)
Calling [facility] for rejection letter to be faxed to [place]  They stated they have faxed already, explained to them we HAVE NOT received a fax request on this patient for Aciphex

## 2015-07-29 ENCOUNTER — Encounter: Payer: Self-pay | Admitting: Gastroenterology

## 2015-07-29 NOTE — Telephone Encounter (Signed)
Sent in prior auth through cover my meds  Waiting on a decision, Called patient to inform

## 2015-07-29 NOTE — Telephone Encounter (Signed)
Pt said she is almost out of her Aciphex and wants to know if you have received the denial letter. Requesting CB

## 2015-07-30 ENCOUNTER — Telehealth: Payer: Self-pay | Admitting: Gastroenterology

## 2015-07-30 NOTE — Telephone Encounter (Signed)
[place] [phone] They still had the fax I sent them through cover my meds in there box.  So the agent worked up the authorization while they had me on the phone. It is pending approval.. Marked Urgent, patient is out of medication. Should be approved with in 24 hours. Called patient to inform

## 2015-07-30 NOTE — Telephone Encounter (Signed)
[place] [phone] They still had the fax I sent them through cover my meds in there box.  So the agent worked up the authorization while they had me on the phone. It is pending approval.. Marked Urgent, patient is out of medication. Should be approved with in 24 hours. Called patient to inform

## 2015-11-23 ENCOUNTER — Other Ambulatory Visit: Payer: Self-pay | Admitting: Gastroenterology

## 2016-03-24 ENCOUNTER — Other Ambulatory Visit: Payer: Self-pay | Admitting: Gastroenterology

## 2016-04-01 ENCOUNTER — Ambulatory Visit (INDEPENDENT_AMBULATORY_CARE_PROVIDER_SITE_OTHER): Payer: PRIVATE HEALTH INSURANCE | Admitting: Physician Assistant

## 2016-04-01 ENCOUNTER — Encounter: Payer: Self-pay | Admitting: Physician Assistant

## 2016-04-01 ENCOUNTER — Encounter (INDEPENDENT_AMBULATORY_CARE_PROVIDER_SITE_OTHER): Payer: Self-pay

## 2016-04-01 VITALS — BP 134/72 | HR 104 | Ht 60.25 in | Wt 140.5 lb

## 2016-04-01 DIAGNOSIS — Z08 Encounter for follow-up examination after completed treatment for malignant neoplasm: Secondary | ICD-10-CM | POA: Diagnosis not present

## 2016-04-01 DIAGNOSIS — Z85038 Personal history of other malignant neoplasm of large intestine: Secondary | ICD-10-CM

## 2016-04-01 DIAGNOSIS — R110 Nausea: Secondary | ICD-10-CM

## 2016-04-01 DIAGNOSIS — R1084 Generalized abdominal pain: Secondary | ICD-10-CM

## 2016-04-01 NOTE — Patient Instructions (Signed)

## 2016-04-01 NOTE — Progress Notes (Signed)
Chief Complaint: Abdominal Pain, Nausea  HPI:   Stephanie Carroll is a 61 year old Caucasian female with a history of colon cancer diagnosed in 2008 status post segmental resection of ascending colon, last colonoscopy in August 2015 was by Dr. Deatra Ina with recommendations for repeat in 3 years.     Patient has now established care with Dr. Silverio Decamp and was last seen 07/24/15 for reflux and bright red blood per rectum. At that time patient was continued on AcipHex twice daily and given a trial of FD Guard twice a day as well as started on VSL #3.   Today, the patient presents to clinic and tells me that she has had very intermittent nausea as well as a generalized abdominal pain over the past few months. Patient describes that her nausea comes on intermittently throughout the day, but can only recall 2-3 instances where this has occurred. She has used Zofran during these times and symptoms have been relived. She is not very concerned about this. She continues her AcipHex 20 mg twice a day. She tells me she never tried the FD guard.   Patient also complains of a generalized abdominal pain which she sometimes feels "more on one side than the other", this goes up underneath her ribs. Patient tells me that when this occurs she places heating pads on her abdomen and this makes it go away. Patient tells me that if she rubs her stomach she also feels better. She describes this pain as a "sharp/burning/nerve pain". The patient attributes this to her many abdominal surgeries and scar tissue/nerve damage as well as arthritis. She tells me she is not very concerned about this but her friend who is also her doctor was a little more concerned. Patient tells me this always goes away with heating pads and does not happen very frequently. She cannot relat it to eating but can attribute it sometimes to more strenuous movement preceding episodes, "like muscle strain".   Patient denies fever, chills, blood in her stool, melena, weight  loss, fatigue, anorexia, vomiting, change in bowel habits, heartburn, reflux or symptoms that awaken her at night.   Past Medical History:  Diagnosis Date  . Arthritis    DDD  . Asthma   . Colon cancer ([place]) 2008  . Diabetes mellitus   . GERD (gastroesophageal reflux disease)   . Thyroid tumor, benign     Past Surgical History:  Procedure Laterality Date  . BIOPSY THYROID     X2  . CESAREAN SECTION     x 1  . COLON SURGERY    . FOOT FRACTURE SURGERY Left 02/2015  . INCISIONAL HERNIA REPAIR    . NASAL SEPTUM SURGERY    . PARTIAL HYSTERECTOMY    . POLYPECTOMY    . UPPER GASTROINTESTINAL ENDOSCOPY    . WRIST GANGLION EXCISION     left    Current Outpatient Prescriptions  Medication Sig Dispense Refill  . albuterol (PROVENTIL HFA;VENTOLIN HFA) 108 (90 BASE) MCG/ACT inhaler Inhale 1 puff into the lungs every 6 (six) hours as needed. For shortness for breath    . cetirizine (ZYRTEC ALLERGY) 10 MG tablet Take 10 mg by mouth daily.      Marland Kitchen guaiFENesin 200 MG tablet Take 400 mg by mouth 2 (two) times daily.    . hydrocortisone (ANUSOL-HC) 2.5 % rectal cream Place 1 application rectally 2 (two) times daily. 30 g 1  . LORazepam (ATIVAN) 0.5 MG tablet Take 0.5 mg by mouth every 8 (eight) hours  as needed. For anxiety    . metFORMIN (GLUCOPHAGE-XR) 500 MG 24 hr tablet Take 1,000 mg by mouth 2 (two) times daily.    . mometasone (ASMANEX) 220 MCG/INH inhaler Inhale 1 puff into the lungs 2 (two) times daily.      Marland Kitchen OVER THE COUNTER MEDICATION daily. Plexus    . Probiotic Product (ACIDOPHILUS/GOAT MILK) CAPS Take by mouth.    . RABEprazole (ACIPHEX) 20 MG tablet TAKE 1 TABLET (20 MG TOTAL) BY MOUTH 2 (TWO) TIMES DAILY. 60 tablet 3   No current facility-administered medications for this visit.     Allergies as of 04/01/2016 - Review Complete 04/01/2016  Allergen Reaction Noted  . Fluticasone-salmeterol  09/28/2007  . Metronidazole  08/27/2009  . Morphine  09/28/2007  . Other   10/14/2011  . Peanuts [peanut oil] Other (See Comments) 10/14/2011  . Penicillins  09/28/2007  . Sulfonamide derivatives      Family History  Problem Relation Age of Onset  . COPD      both sides  . Diabetes      both sides  . Heart disease Father   . Kidney failure Father   . Lung cancer Paternal Uncle   . Leukemia Paternal Grandfather   . Cervical cancer Paternal Aunt     x 2   . Heart defect Mother     MVP  . Colon cancer Neg Hx     Social History   Social History  . Marital status: Married    Spouse name: N/A  . Number of children: 1  . Years of education: N/A   Occupational History  . claims for [employer]   Social History Main Topics  . Smoking status: Never Smoker  . Smokeless tobacco: Never Used     Comment: never used product  . Alcohol use No  . Drug use: No  . Sexual activity: Not on file   Other Topics Concern  . Not on file   Social History Narrative  . No narrative on file    Review of Systems:    Constitutional: No weight loss, fever, chills, weakness or fatigue Cardiovascular: No chest pain Respiratory: No SOB  Gastrointestinal: See HPI and otherwise negative   Physical Exam:  Vital signs: BP 134/72 (BP Location: Left Arm, Patient Position: Sitting, Cuff Size: Normal)   Pulse (!) 104   Ht 5' 0.25" (1.53 m)   Wt 140 lb 8 oz (63.7 kg)   BMI 27.21 kg/m   Constitutional:   Pleasant overweight Caucasian female appears to be in NAD, Well developed, Well nourished, alert and cooperative  Respiratory: Respirations even and unlabored. Lungs clear to auscultation bilaterally.   No wheezes, crackles, or rhonchi.  Cardiovascular: Normal S1, S2. No MRG. Regular rate and rhythm. No peripheral edema, cyanosis or pallor.  Gastrointestinal:  Soft, nondistended, nontender. No rebound or guarding. Normal bowel sounds. No appreciable masses or hepatomegaly. Multiple surgical scars Rectal:  Not performed.  Psychiatric:Demonstrates good judgement  and reason without abnormal affect or behaviors.  Patient reports recent labs including a CBC, CMP, lipase and urinalysis by her PCP. We do not have these results with the patient tells me everything was normal other than having a UTI. This was a few months ago.  Assessment: 1. Abdominal pain: Patient describes very intermittent and relieved with heating pads, likely she is correct and this is related to her arthritis/nerve damage from surgeries 2. Nausea: Very intermittent, patient has only had used 3 Zofran in  the past 6 months, consider relation to diet versus reflux versus other 3. Personal history of colon cancer: Surveillance colonoscopy due in August of this year  Plan: 1. At this time in a patient with a history of colon cancer, we will go ahead and pursue her surveillance colonoscopy at this time as it is due later this year and if this is normal and patient continues with some abdominal pain, could consider further imaging such as CT abdomen/pelvis or other in the future. Patient does not wish to do this today. Discussed risks, benefits, limitations and alternatives and the patient agrees to proceed with the procedure in the [place]. This was scheduled with Dr. Silverio Decamp. 2. Patient to continue using heating pads when necessary for abdominal pain 3. Patient to continue AcipHex 20 mg twice a day and Zofran for very occasional nausea 4. Patient to return toclinic per Dr. Woodward Ku recommendations after time of procedure.  Ellouise Newer, PA-C [facility] 04/01/2016, 11:53 AM  Cc: No ref. provider found

## 2016-04-06 ENCOUNTER — Encounter: Payer: Self-pay | Admitting: Gastroenterology

## 2016-04-06 ENCOUNTER — Ambulatory Visit (AMBULATORY_SURGERY_CENTER): Payer: PRIVATE HEALTH INSURANCE | Admitting: Gastroenterology

## 2016-04-06 VITALS — BP 139/87 | HR 90 | Temp 97.8°F | Resp 14 | Ht 60.25 in | Wt 140.0 lb

## 2016-04-06 DIAGNOSIS — Z980 Intestinal bypass and anastomosis status: Secondary | ICD-10-CM | POA: Diagnosis not present

## 2016-04-06 DIAGNOSIS — D123 Benign neoplasm of transverse colon: Secondary | ICD-10-CM

## 2016-04-06 DIAGNOSIS — D124 Benign neoplasm of descending colon: Secondary | ICD-10-CM

## 2016-04-06 DIAGNOSIS — Z85038 Personal history of other malignant neoplasm of large intestine: Secondary | ICD-10-CM

## 2016-04-06 MED ORDER — SODIUM CHLORIDE 0.9 % IV SOLN: 500.0000 mL | INTRAVENOUS | Status: DC

## 2016-04-06 NOTE — Progress Notes (Signed)
A/ox3 pleased with MAC, report to Penny RN 

## 2016-04-06 NOTE — Op Note (Signed)
[facility] Patient Name: Stephanie Carroll Procedure Date: 04/06/2016 9:33 AM MRN: [id number] Endoscopist: Mauri Pole , MD Age: 61 Referring MD:  Date of Birth: 05-24-1955 Gender: Female Account #: [id number] Procedure:                Colonoscopy Indications:              Surveillance: Personal history of adenomatous                            polyps on last colonoscopy 3 years ago, High risk                            colon cancer surveillance: Personal history of                            colon cancer Medicines:                Monitored Anesthesia Care Procedure:                Pre-Anesthesia Assessment:                           - Prior to the procedure, a History and Physical                            was performed, and patient medications and                            allergies were reviewed. The patient's tolerance of                            previous anesthesia was also reviewed. The risks                            and benefits of the procedure and the sedation                            options and risks were discussed with the patient.                            All questions were answered, and informed consent                            was obtained. Prior Anticoagulants: The patient has                            taken no previous anticoagulant or antiplatelet                            agents. ASA Grade Assessment: II - A patient with                            mild systemic disease. After reviewing the risks  and benefits, the patient was deemed in                            satisfactory condition to undergo the procedure.                           After obtaining informed consent, the colonoscope                            was passed under direct vision. Throughout the                            procedure, the patient's blood pressure, pulse, and                            oxygen saturations were monitored continuously. The                            Model CF-HQ190L [id number]) scope was introduced                            through the anus and advanced to the the                            ileocolonic anastomosis. The colonoscopy was                            performed without difficulty. The patient tolerated                            the procedure well. The quality of the bowel                            preparation was excellent. Ileocolonic                            anastomaosis, the terminal ileum and the rectum                            were photographed. Scope In: 9:36:12 AM Scope Out: 9:49:55 AM Scope Withdrawal Time: 0 hours 11 minutes 7 seconds  Total Procedure Duration: 0 hours 13 minutes 43 seconds  Findings:                 The perianal and digital rectal examinations were                            normal.                           There was evidence of a prior side-to-side                            ileo-colonic anastomosis in the transverse colon.  This was patent and was characterized by                            congestion, edema and erythema. The anastomosis was                            traversed. Biopsies were taken with a cold forceps                            for histology.                           Four sessile polyps were found in the descending                            colon x 3 and transverse colon x1. The polyps were                            4 to 8 mm in size. These polyps were removed with a                            cold snare. Resection and retrieval were complete.                           Non-bleeding internal hemorrhoids were found during                            retroflexion. The hemorrhoids were small. Complications:            No immediate complications. Estimated Blood Loss:     Estimated blood loss was minimal. Impression:               - Patent side-to-side ileo-colonic anastomosis,                            characterized by  congestion, edema and erythema.                            Biopsied.                           - Four 4 to 8 mm polyps in the descending colon and                            in the transverse colon, removed with a cold snare.                            Resected and retrieved.                           - Non-bleeding internal hemorrhoids. Recommendation:           - Patient has a contact number available for  emergencies. The signs and symptoms of potential                            delayed complications were discussed with the                            patient. Return to normal activities tomorrow.                            Written discharge instructions were provided to the                            patient.                           - Resume previous diet.                           - Continue present medications.                           - No aspirin, ibuprofen, naproxen, or other                            non-steroidal anti-inflammatory drugs.                           - Await pathology results.                           - Repeat colonoscopy in 3 years for surveillance                            based on pathology results.                           - Return to GI clinic PRN. Mauri Pole, MD 04/06/2016 9:58:52 AM This report has been signed electronically.

## 2016-04-06 NOTE — Patient Instructions (Signed)
YOU HAD AN ENDOSCOPIC PROCEDURE TODAY AT [facility]:   Refer to the procedure report that was given to you for any specific questions about what was found during the examination.  If the procedure report does not answer your questions, please call your gastroenterologist to clarify.  If you requested that your care partner not be given the details of your procedure findings, then the procedure report has been included in a sealed envelope for you to review at your convenience later.  YOU SHOULD EXPECT: Some feelings of bloating in the abdomen. Passage of more gas than usual.  Walking can help get rid of the air that was put into your GI tract during the procedure and reduce the bloating. If you had a lower endoscopy (such as a colonoscopy or flexible sigmoidoscopy) you may notice spotting of blood in your stool or on the toilet paper. If you underwent a bowel prep for your procedure, you may not have a normal bowel movement for a few days.  Please Note:  You might notice some irritation and congestion in your nose or some drainage.  This is from the oxygen used during your procedure.  There is no need for concern and it should clear up in a day or so.  SYMPTOMS TO REPORT IMMEDIATELY:   Following lower endoscopy (colonoscopy or flexible sigmoidoscopy):  Excessive amounts of blood in the stool  Significant tenderness or worsening of abdominal pains  Swelling of the abdomen that is new, acute  Fever of 100F or higher      For urgent or emergent issues, a gastroenterologist can be reached at any hour by calling [phone].   DIET:  We do recommend a small meal at first, but then you may proceed to your regular diet.  Drink plenty of fluids but you should avoid alcoholic beverages for 24 hours.  ACTIVITY:  You should plan to take it easy for the rest of today and you should NOT DRIVE or use heavy machinery until tomorrow (because of the sedation medicines used during the  test).    FOLLOW UP: Our staff will call the number listed on your records the next business day following your procedure to check on you and address any questions or concerns that you may have regarding the information given to you following your procedure. If we do not reach you, we will leave a message.  However, if you are feeling well and you are not experiencing any problems, there is no need to return our call.  We will assume that you have returned to your regular daily activities without incident.  If any biopsies were taken you will be contacted by phone or by letter within the next 1-3 weeks.  Please call us at [phone] if you have not heard about the biopsies in 3 weeks.    SIGNATURES/CONFIDENTIALITY: You and/or your care partner have signed paperwork which will be entered into your electronic medical record.  These signatures attest to the fact that that the information above on your After Visit Summary has been reviewed and is understood.  Full responsibility of the confidentiality of this discharge information lies with you and/or your care-partner.   AWAIT PATHOLOGY RESULTS  NO ASPIRIN ,IBUPROFEN,NAPROXEN,OR OTHER NON STEROIDAL ANTI INFLAMMATORY PRODUCTS   INFORMATION ON POLYPS AND HEMORRHOIDS GIVEN TO YOU TODAY

## 2016-04-06 NOTE — Progress Notes (Signed)
Called to room to assist during endoscopic procedure.  Patient ID and intended procedure confirmed with present staff. Received instructions for my participation in the procedure from the performing physician.  

## 2016-04-07 ENCOUNTER — Telehealth: Payer: Self-pay

## 2016-04-07 NOTE — Telephone Encounter (Signed)
  Follow up Call-  Call back number 04/06/2016 10/01/2013  Post procedure Call Back phone  # [phone]  Permission to leave phone message Yes Yes  Some recent data might be hidden     Patient questions:  Do you have a fever, pain , or abdominal swelling? No. Pain Score  0 *  Have you tolerated food without any problems? Yes.    Have you been able to return to your normal activities? Yes.    Do you have any questions about your discharge instructions: Diet   No. Medications  No. Follow up visit  No.  Do you have questions or concerns about your Care? No.  Actions: * If pain score is 4 or above: No action needed, pain <4.

## 2016-04-11 NOTE — Progress Notes (Signed)
Reviewed and agree with documentation and assessment and plan. K. Veena Nandigam , MD   

## 2016-04-15 ENCOUNTER — Encounter: Payer: Self-pay | Admitting: Gastroenterology

## 2016-04-22 ENCOUNTER — Ambulatory Visit (HOSPITAL_BASED_OUTPATIENT_CLINIC_OR_DEPARTMENT_OTHER): Payer: PRIVATE HEALTH INSURANCE | Admitting: Hematology & Oncology

## 2016-04-22 ENCOUNTER — Other Ambulatory Visit (HOSPITAL_BASED_OUTPATIENT_CLINIC_OR_DEPARTMENT_OTHER): Payer: PRIVATE HEALTH INSURANCE

## 2016-04-22 VITALS — BP 148/68 | HR 106 | Temp 97.7°F | Wt 142.2 lb

## 2016-04-22 DIAGNOSIS — C182 Malignant neoplasm of ascending colon: Secondary | ICD-10-CM

## 2016-04-22 DIAGNOSIS — Z85038 Personal history of other malignant neoplasm of large intestine: Secondary | ICD-10-CM

## 2016-04-22 LAB — COMPREHENSIVE METABOLIC PANEL (CC13)
ALBUMIN: 4.3 g/dL (ref 3.5–5.5)
ALK PHOS: 103 IU/L (ref 39–117)
ALT: 45 IU/L — ABNORMAL HIGH (ref 0–32)
AST: 27 IU/L (ref 0–40)
Albumin/Globulin Ratio: 1.4 (ref 1.2–2.2)
BILIRUBIN TOTAL: 0.2 mg/dL (ref 0.0–1.2)
BUN / CREAT RATIO: 11 (ref 9–23)
BUN: 6 mg/dL (ref 6–24)
Calcium, Ser: 9.5 mg/dL (ref 8.7–10.2)
Carbon Dioxide, Total: 21 mmol/L (ref 18–29)
Chloride, Ser: 95 mmol/L — ABNORMAL LOW (ref 96–106)
Creatinine, Ser: 0.55 mg/dL — ABNORMAL LOW (ref 0.57–1.00)
GFR calc Af Amer: 120 mL/min/{1.73_m2} (ref >59)
GFR calc non Af Amer: 104 mL/min/{1.73_m2} (ref >59)
GLOBULIN, TOTAL: 3 g/dL (ref 1.5–4.5)
Glucose: 327 mg/dL — ABNORMAL HIGH (ref 65–99)
POTASSIUM: 4.1 mmol/L (ref 3.5–5.2)
SODIUM: 126 mmol/L — AB (ref 134–144)
Total Protein: 7.3 g/dL (ref 6.0–8.5)

## 2016-04-22 LAB — CBC WITH DIFFERENTIAL (CANCER CENTER ONLY)
BASO#: 0.1 10*3/uL (ref 0.0–0.2)
BASO%: 0.6 % (ref 0.0–2.0)
EOS%: 2.2 % (ref 0.0–7.0)
Eosinophils Absolute: 0.2 10*3/uL (ref 0.0–0.5)
HCT: 38.6 % (ref 34.8–46.6)
HEMOGLOBIN: 12.7 g/dL (ref 11.6–15.9)
LYMPH#: 2.7 10*3/uL (ref 0.9–3.3)
LYMPH%: 31 % (ref 14.0–48.0)
MCH: 27.3 pg (ref 26.0–34.0)
MCHC: 32.9 g/dL (ref 32.0–36.0)
MCV: 83 fL (ref 81–101)
MONO#: 0.6 10*3/uL (ref 0.1–0.9)
MONO%: 6.4 % (ref 0.0–13.0)
NEUT%: 59.8 % (ref 39.6–80.0)
NEUTROS ABS: 5.1 10*3/uL (ref 1.5–6.5)
PLATELETS: 307 10*3/uL (ref 145–400)
RBC: 4.66 10*6/uL (ref 3.70–5.32)
RDW: 14.2 % (ref 11.1–15.7)
WBC: 8.6 10*3/uL (ref 3.9–10.0)

## 2016-04-22 NOTE — Progress Notes (Signed)
Hematology and Oncology Follow Up Visit  Stephanie Carroll [id number] 1955-06-18 61 y.o. 04/22/2016   Principle Diagnosis:  Stage II carcinoma of colon  Current Therapy:   Observation    Interim History:  Stephanie Carroll is here today for a follow-up. We last saw her a year ago. She is healed up from the fracture of her left ankle. This took quite a while. It took about 7 or 8 months before she got better.  She had a colonoscopy a week or so ago. 4 polyps were found. She is not sure if any of these are malignant. Hopefully they are not.   Her last CEA back in March 2017 was 1.4.  She has adopted a niece's son. Apparently, this was a very bad situation and thankfully she was able to get the little boy and now give him a good life.   Her mother has breast cancer. She wanted her mom to see [place]. However, the surgeon felt that she should go to the breast center. Thankfully she is doing quite well.  She's had no flareups of the eczema. She's had no issues with nausea or vomiting. She's had no cough. She's had no influenza.  Overall, her performance status is ECOG 1.   Medications:  Allergies as of 04/22/2016      Reactions   Fluticasone-salmeterol    Metronidazole    Morphine    REACTION: heart racing   Other    Oats and Green peppers---headaches   Peanuts [peanut Oil] Other (See Comments)   congested   Penicillins    REACTION: rash   Sulfonamide Derivatives       Medication List       Accurate as of 04/22/16  3:17 PM. Always use your most recent med list.          Acidophilus/Goat Milk Caps Take by mouth.   albuterol 108 (90 Base) MCG/ACT inhaler Commonly known as:  PROVENTIL HFA;VENTOLIN HFA Inhale 1 puff into the lungs every 6 (six) hours as needed. For shortness for breath   guaiFENesin 200 MG tablet Take 400 mg by mouth 2 (two) times daily.   hydrocortisone 2.5 % rectal cream Commonly known as:  ANUSOL-HC Place 1 application rectally 2 (two) times daily.   LORazepam 0.5  MG tablet Commonly known as:  ATIVAN Take 0.5 mg by mouth every 8 (eight) hours as needed. For anxiety   metFORMIN 500 MG 24 hr tablet Commonly known as:  GLUCOPHAGE-XR Take 1,000 mg by mouth 2 (two) times daily.   mometasone 220 MCG/INH inhaler Commonly known as:  ASMANEX Inhale 1 puff into the lungs 2 (two) times daily.   OVER THE COUNTER MEDICATION daily. Plexus   RABEprazole 20 MG tablet Commonly known as:  ACIPHEX TAKE 1 TABLET (20 MG TOTAL) BY MOUTH 2 (TWO) TIMES DAILY.   ZYRTEC ALLERGY 10 MG tablet Generic drug:  cetirizine Take 10 mg by mouth daily.       Allergies:  Allergies  Allergen Reactions  . Fluticasone-Salmeterol   . Metronidazole   . Morphine     REACTION: heart racing  . Other     Oats and Green peppers---headaches  . Peanuts [Peanut Oil] Other (See Comments)    congested  . Penicillins     REACTION: rash  . Sulfonamide Derivatives     Past Medical History, Surgical history, Social history, and Family History were reviewed and updated.  Review of Systems: All other 10 point review of systems is negative.  Physical Exam:  weight is 142 lb 4 oz (64.5 kg). Her oral temperature is 97.7 F (36.5 C). Her blood pressure is 148/68 (abnormal) and her pulse is 106 (abnormal).   Wt Readings from Last 3 Encounters:  04/22/16 142 lb 4 oz (64.5 kg)  04/06/16 140 lb (63.5 kg)  04/01/16 140 lb 8 oz (63.7 kg)    Ocular: Sclerae unicteric, pupils equal, round and reactive to light Ear-nose-throat: Oropharynx clear, dentition fair Lymphatic: No cervical supraclavicular or axillary adenopathy Lungs no rales or rhonchi, good excursion bilaterally Heart regular rate and rhythm, no murmur appreciated Abd soft, nontender, positive bowel sounds, no liver or spleen tip palpated on exam MSK no focal spinal tenderness, no joint edema Neuro: non-focal, well-oriented, appropriate affect Breasts: Deferred  Lab Results  Component Value Date   WBC 8.6  04/22/2016   HGB 12.7 04/22/2016   HCT 38.6 04/22/2016   MCV 83 04/22/2016   PLT 307 04/22/2016   Lab Results  Component Value Date   FERRITIN 201.3 08/08/2008   IRON 58 08/08/2008   IRONPCTSAT 13.3 (L) 08/08/2008   Lab Results  Component Value Date   RETICCTPCT 2.0 03/01/2007   RBC 4.66 04/22/2016   RETICCTABS 101.4 03/01/2007   No results found for: KPAFRELGTCHN, LAMBDASER, KAPLAMBRATIO No results found for: IGGSERUM, IGA, IGMSERUM No results found for: Kathrynn Ducking, MSPIKE, SPEI   Chemistry      Component Value Date/Time   NA 133 (L) 04/22/2015 1534   NA 141 11/29/2013 1454   K 3.6 04/22/2015 1534   K 3.8 11/29/2013 1454   CL 96 04/22/2015 1534   CL 98 11/29/2013 1454   CO2 23 04/22/2015 1534   CO2 25 11/29/2013 1454   BUN 11 04/22/2015 1534   BUN 12 11/29/2013 1454   CREATININE 0.63 04/22/2015 1534   CREATININE 0.8 11/29/2013 1454      Component Value Date/Time   CALCIUM 10.1 04/22/2015 1534   CALCIUM 9.2 11/29/2013 1454   ALKPHOS 96 04/22/2015 1534   ALKPHOS 93 (H) 11/29/2013 1454   AST 51 (H) 04/22/2015 1534   AST 26 11/29/2013 1454   ALT 65 (H) 04/22/2015 1534   ALT 36 11/29/2013 1454   BILITOT 0.3 04/22/2015 1534   BILITOT 0.40 11/29/2013 1454     Impression and Plan: Stephanie Carroll is a 61 yo white female with a history of 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 She did not require any adjuvant therapy. She has done well since then and so far there has been no evidence of recurrence. She is asymptomatic at this time.  Her CEA has remained stable. Her results today are pending.   We will see her back in one year. I don't see that we have to do any scans on her.  Volanda Napoleon, MD 3/9/20183:17 PM

## 2016-04-23 LAB — CEA: CEA: 2.9 ng/mL (ref 0.0–4.7)

## 2016-04-25 LAB — CEA (IN HOUSE-CHCC): CEA (CHCC-IN HOUSE): 1.78 ng/mL (ref 0.00–5.00)

## 2016-05-20 ENCOUNTER — Telehealth: Payer: Self-pay | Admitting: *Deleted

## 2016-05-20 NOTE — Telephone Encounter (Signed)
Patient calling for results of CEA done on 04/22/16. Given results.

## 2016-06-24 ENCOUNTER — Other Ambulatory Visit: Payer: Self-pay | Admitting: Gastroenterology

## 2016-06-24 ENCOUNTER — Other Ambulatory Visit: Payer: Self-pay | Admitting: *Deleted

## 2016-06-24 MED ORDER — RABEPRAZOLE SODIUM 20 MG PO TBEC: 20.0000 mg | DELAYED_RELEASE_TABLET | Freq: Two times a day (BID) | ORAL | 3 refills | Status: DC

## 2016-06-24 NOTE — Telephone Encounter (Signed)
Aciphex sent per fax request from pharmacy

## 2016-08-08 ENCOUNTER — Encounter: Payer: Self-pay | Admitting: Gastroenterology

## 2016-10-24 ENCOUNTER — Other Ambulatory Visit: Payer: Self-pay | Admitting: *Deleted

## 2016-10-24 ENCOUNTER — Telehealth: Payer: Self-pay | Admitting: Gastroenterology

## 2016-10-24 MED ORDER — RABEPRAZOLE SODIUM 20 MG PO TBEC: 20.0000 mg | DELAYED_RELEASE_TABLET | Freq: Two times a day (BID) | ORAL | 3 refills | Status: DC

## 2016-10-24 NOTE — Telephone Encounter (Signed)
Will contact cover my meds to try and do prior authorization for quantity for two per day

## 2016-10-24 NOTE — Telephone Encounter (Signed)
Refill request from pharmacy per fax sent in electronically

## 2016-10-24 NOTE — Telephone Encounter (Signed)
Pharmacy states medication RABEprazole (ACIPHEX) needs prior auth for medication to be approved by insurance

## 2016-10-26 ENCOUNTER — Encounter: Payer: Self-pay | Admitting: Gastroenterology

## 2016-10-31 ENCOUNTER — Telehealth: Payer: Self-pay | Admitting: Gastroenterology

## 2016-10-31 NOTE — Telephone Encounter (Signed)
After about 30 minutes on the phone with [facility] and [facility] generic aciphex was finally approved for 5 years as a urgent request until 10/31/2016   Regular Brand Aciphex didnt need a prior auth but copay was 3,000 A request for generic had to go in for clinical review and was approved  Pt aware

## 2017-04-10 DIAGNOSIS — M65311 Trigger thumb, right thumb: Secondary | ICD-10-CM | POA: Insufficient documentation

## 2017-04-21 ENCOUNTER — Inpatient Hospital Stay: Payer: PRIVATE HEALTH INSURANCE | Attending: Hematology & Oncology

## 2017-04-21 ENCOUNTER — Inpatient Hospital Stay (HOSPITAL_BASED_OUTPATIENT_CLINIC_OR_DEPARTMENT_OTHER): Payer: PRIVATE HEALTH INSURANCE | Admitting: Family

## 2017-04-21 ENCOUNTER — Other Ambulatory Visit: Payer: Self-pay

## 2017-04-21 ENCOUNTER — Encounter: Payer: Self-pay | Admitting: Family

## 2017-04-21 VITALS — BP 139/61 | HR 97 | Temp 98.1°F | Resp 16 | Wt 137.0 lb

## 2017-04-21 DIAGNOSIS — Z7984 Long term (current) use of oral hypoglycemic drugs: Secondary | ICD-10-CM

## 2017-04-21 DIAGNOSIS — Z85038 Personal history of other malignant neoplasm of large intestine: Secondary | ICD-10-CM

## 2017-04-21 DIAGNOSIS — Z79899 Other long term (current) drug therapy: Secondary | ICD-10-CM | POA: Diagnosis not present

## 2017-04-21 DIAGNOSIS — C182 Malignant neoplasm of ascending colon: Secondary | ICD-10-CM

## 2017-04-21 LAB — CMP (CANCER CENTER ONLY)
ALBUMIN: 3.9 g/dL (ref 3.5–5.0)
ALK PHOS: 92 U/L — AB (ref 26–84)
ALT: 43 U/L (ref 10–47)
AST: 29 U/L (ref 11–38)
Anion gap: 16 — ABNORMAL HIGH (ref 5–15)
BUN: 11 mg/dL (ref 7–22)
CO2: 26 mmol/L (ref 18–33)
Calcium: 9.5 mg/dL (ref 8.0–10.3)
Chloride: 100 mmol/L (ref 98–108)
Creatinine: 0.7 mg/dL (ref 0.60–1.20)
Glucose, Bld: 240 mg/dL — ABNORMAL HIGH (ref 73–118)
POTASSIUM: 3.9 mmol/L (ref 3.3–4.7)
SODIUM: 142 mmol/L (ref 128–145)
TOTAL PROTEIN: 7.9 g/dL (ref 6.4–8.1)
Total Bilirubin: 0.5 mg/dL (ref 0.2–1.6)

## 2017-04-21 LAB — CBC WITH DIFFERENTIAL (CANCER CENTER ONLY)
BASOS ABS: 0.1 10*3/uL (ref 0.0–0.1)
BASOS PCT: 1 %
EOS ABS: 0.2 10*3/uL (ref 0.0–0.5)
EOS PCT: 2 %
HCT: 40 % (ref 34.8–46.6)
HEMOGLOBIN: 13.1 g/dL (ref 11.6–15.9)
LYMPHS ABS: 3.4 10*3/uL — AB (ref 0.9–3.3)
Lymphocytes Relative: 28 %
MCH: 27.5 pg (ref 26.0–34.0)
MCHC: 32.8 g/dL (ref 32.0–36.0)
MCV: 83.9 fL (ref 81.0–101.0)
Monocytes Absolute: 0.7 10*3/uL (ref 0.1–0.9)
Monocytes Relative: 6 %
NEUTROS PCT: 63 %
Neutro Abs: 8.1 10*3/uL — ABNORMAL HIGH (ref 1.5–6.5)
Platelet Count: 323 10*3/uL (ref 145–400)
RBC: 4.77 MIL/uL (ref 3.70–5.32)
RDW: 14.1 % (ref 11.1–15.7)
WBC: 12.5 10*3/uL — AB (ref 3.9–10.0)

## 2017-04-21 NOTE — Progress Notes (Signed)
Hematology and Oncology Follow Up Visit  Stephanie Carroll [id number] February 05, 1956 62 y.o. 04/21/2017   Principle Diagnosis:  Stage II carcinoma of colon  Current Therapy:   Observation   Interim History:  Stephanie Carroll is here today for follow-up. She is doing well but has had some issues with her arthritis. The joints in her hands are the most bothersome at this time.  No episodes of bleeding, no bruising or petechiae.  She has occasional SOB with over exertion and asthma. This is unchanged.  No lymphadenopathy found on exam.  No fever, chills, n/v, cough, rash, dizziness, chest pain, palpitations or changes in bowel or bladder habits.  She has occasional twinges of pain in the right flank. This comes and goes. Exam today is negative.   No swelling, tenderness, numbness or tingling in her extremities. No c/o pain at this time.  She has a good appetite and is staying well hydrated. Her weight is stable.  She states that her blood sugars have been running high. She has just started long acting insulin and hopes this will help.   ECOG Performance Status: 0 - Asymptomatic  Medications:  Allergies as of 04/21/2017      Reactions   Fluticasone-salmeterol    Metronidazole    Morphine    REACTION: heart racing   Other    Oats and Green peppers---headaches   Peanuts [peanut Oil] Other (See Comments)   congested   Penicillins    REACTION: rash   Sulfonamide Derivatives       Medication List        Accurate as of 04/21/17  2:56 PM. Always use your most recent med list.          Acidophilus/Goat Milk Caps Take by mouth.   albuterol 108 (90 Base) MCG/ACT inhaler Commonly known as:  PROVENTIL HFA;VENTOLIN HFA Inhale 1 puff into the lungs every 6 (six) hours as needed. For shortness for breath   guaiFENesin 200 MG tablet Take 400 mg by mouth 2 (two) times daily.   LORazepam 0.5 MG tablet Commonly known as:  ATIVAN Take 0.5 mg by mouth every 8 (eight) hours as needed. For anxiety     metFORMIN 500 MG 24 hr tablet Commonly known as:  GLUCOPHAGE-XR Take 1,000 mg by mouth 2 (two) times daily.   mometasone 220 MCG/INH inhaler Commonly known as:  ASMANEX Inhale 1 puff into the lungs 2 (two) times daily.   OVER THE COUNTER MEDICATION daily. Plexus   PROCTOSOL HC 2.5 % rectal cream Generic drug:  hydrocortisone PLACE 1 APPLICATION RECTALLY 2 (TWO) TIMES DAILY.   RABEprazole 20 MG tablet Commonly known as:  ACIPHEX Take 1 tablet (20 mg total) by mouth 2 (two) times daily.   ZYRTEC ALLERGY 10 MG tablet Generic drug:  cetirizine Take 10 mg by mouth daily.       Allergies:  Allergies  Allergen Reactions  . Fluticasone-Salmeterol   . Metronidazole   . Morphine     REACTION: heart racing  . Other     Oats and Green peppers---headaches  . Peanuts [Peanut Oil] Other (See Comments)    congested  . Penicillins     REACTION: rash  . Sulfonamide Derivatives     Past Medical History, Surgical history, Social history, and Family History were reviewed and updated.  Review of Systems: All other 10 point review of systems is negative.   Physical Exam:  vitals were not taken for this visit.   Wt Readings from Last  3 Encounters:  04/22/16 142 lb 4 oz (64.5 kg)  04/06/16 140 lb (63.5 kg)  04/01/16 140 lb 8 oz (63.7 kg)    Ocular: Sclerae unicteric, pupils equal, round and reactive to light Ear-nose-throat: Oropharynx clear, dentition fair Lymphatic: No cervical, supraclavicular or axillary adenopathy Lungs no rales or rhonchi, good excursion bilaterally Heart regular rate and rhythm, no murmur appreciated Abd soft, nontender, positive bowel sounds, no liver or spleen tip palpated on exam, no fluid wave  MSK no focal spinal tenderness, no joint edema Neuro: non-focal, well-oriented, appropriate affect Breasts: Deferred   Lab Results  Component Value Date   WBC 8.6 04/22/2016   HGB 12.7 04/22/2016   HCT 38.6 04/22/2016   MCV 83 04/22/2016   PLT 307  04/22/2016   Lab Results  Component Value Date   FERRITIN 201.3 08/08/2008   IRON 58 08/08/2008   IRONPCTSAT 13.3 (L) 08/08/2008   Lab Results  Component Value Date   RETICCTPCT 2.0 03/01/2007   RBC 4.66 04/22/2016   RETICCTABS 101.4 03/01/2007   No results found for: KPAFRELGTCHN, LAMBDASER, KAPLAMBRATIO No results found for: IGGSERUM, IGA, IGMSERUM No results found for: Kathrynn Ducking, MSPIKE, SPEI   Chemistry      Component Value Date/Time   NA 126 (L) 04/22/2016 1456   NA 141 11/29/2013 1454   K 4.1 04/22/2016 1456   K 3.8 11/29/2013 1454   CL 95 (L) 04/22/2016 1456   CL 98 11/29/2013 1454   CO2 21 04/22/2016 1456   CO2 25 11/29/2013 1454   BUN 6 04/22/2016 1456   BUN 12 11/29/2013 1454   CREATININE 0.55 (L) 04/22/2016 1456   CREATININE 0.8 11/29/2013 1454      Component Value Date/Time   CALCIUM 9.5 04/22/2016 1456   CALCIUM 9.2 11/29/2013 1454   ALKPHOS 103 04/22/2016 1456   ALKPHOS 93 (H) 11/29/2013 1454   AST 27 04/22/2016 1456   AST 26 11/29/2013 1454   ALT 45 (H) 04/22/2016 1456   ALT 36 11/29/2013 1454   BILITOT 0.2 04/22/2016 1456   BILITOT 0.40 11/29/2013 1454      Impression and Plan: Stephanie Carroll is a very pleasant 80 yo caucasian female with history of 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 She did not require any adjuvant therapy. So far she continues to do well and has had no issues.  She is up to date on her colonoscopy and had 4 precancerous polyps removed last year.  CEA today is pending.  We will go ahead and plan to see her back in another year.  She promises to contact our office with any questions or concerns. We can certainly see her sooner if need be.   Laverna Peace, NP 3/8/20192:56 PM

## 2017-04-24 LAB — CEA (IN HOUSE-CHCC): CEA (CHCC-In House): 1.91 ng/mL (ref 0.00–5.00)

## 2017-08-19 ENCOUNTER — Other Ambulatory Visit: Payer: Self-pay | Admitting: Gastroenterology

## 2017-10-20 DIAGNOSIS — M5030 Other cervical disc degeneration, unspecified cervical region: Secondary | ICD-10-CM | POA: Insufficient documentation

## 2017-10-20 DIAGNOSIS — M5412 Radiculopathy, cervical region: Secondary | ICD-10-CM | POA: Insufficient documentation

## 2017-10-20 DIAGNOSIS — R200 Anesthesia of skin: Secondary | ICD-10-CM | POA: Insufficient documentation

## 2017-10-31 ENCOUNTER — Other Ambulatory Visit: Payer: Self-pay

## 2017-10-31 ENCOUNTER — Encounter: Payer: Self-pay | Admitting: Physical Therapy

## 2017-10-31 ENCOUNTER — Ambulatory Visit: Payer: PRIVATE HEALTH INSURANCE | Attending: Physical Medicine and Rehabilitation | Admitting: Physical Therapy

## 2017-10-31 DIAGNOSIS — M542 Cervicalgia: Secondary | ICD-10-CM

## 2017-10-31 DIAGNOSIS — M6281 Muscle weakness (generalized): Secondary | ICD-10-CM | POA: Diagnosis present

## 2017-10-31 DIAGNOSIS — G8929 Other chronic pain: Secondary | ICD-10-CM

## 2017-10-31 DIAGNOSIS — M25511 Pain in right shoulder: Secondary | ICD-10-CM | POA: Diagnosis present

## 2017-11-01 NOTE — Therapy (Signed)
[facility] [place], [place], [address] Phone: [phone]   Fax:  [phone]  Physical Therapy Evaluation  Patient Details  Name: Stephanie Carroll MRN: [id number] Date of Birth: 02/06/1956 Referring Provider: Suella Broad, MD   Encounter Date: 10/31/2017  PT End of Session - 10/31/17 0907    Visit Number  1    Number of Visits  8    Date for PT Re-Evaluation  12/05/17    PT Start Time  0823    PT Stop Time  0900    PT Time Calculation (min)  37 min    Activity Tolerance  Patient tolerated treatment well;Patient limited by pain    Behavior During Therapy  [facility] for tasks assessed/performed       Past Medical History:  Diagnosis Date  . Arthritis    DDD  . Asthma   . Colon cancer ([place]) 2008  . Diabetes mellitus   . GERD (gastroesophageal reflux disease)   . Thyroid tumor, benign     Past Surgical History:  Procedure Laterality Date  . BIOPSY THYROID     X2  . CESAREAN SECTION     x 1  . COLON SURGERY    . FOOT FRACTURE SURGERY Left 02/2015  . INCISIONAL HERNIA REPAIR    . NASAL SEPTUM SURGERY    . PARTIAL HYSTERECTOMY    . POLYPECTOMY    . UPPER GASTROINTESTINAL ENDOSCOPY    . WRIST GANGLION EXCISION     left    There were no vitals filed for this visit.   Subjective Assessment - 10/31/17 1734    Subjective  Patient arrives to physical therapy with reports of ongoing right shoulder pain and neck pain that exacerbated in the last six months, April, 2019. Patient reports she is able to perform all ADLs independently but has increased difficulty with performing when her pain is high. She has been avoiding lifting to protect the shoulder/neck. 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 Patient reports 8/10 pain in neck and right shoulder while typing. Pain at best is 3/10 with heating pad on neck and resting. She denies taking over the counter pain medication.  Patient states she will have a nerve conduction study on 11/06/17. Patient stated x-ray report stated "arthritis in the neck." Patient's goals are to decrease pain, have less difficulties with work activities, improve strength and sleep for greater than 4 hours.    Pertinent History  DM, asthma, OP    Limitations  Sitting;Other (comment);Lifting   work activities   Diagnostic tests  NCS: 11/06/17     Patient Stated Goals  do my job without pain, sleep longer than 4 hours    Currently in Pain?  Yes    Pain Score  4     Pain Location  Neck    Pain Orientation  Posterior    Pain Descriptors / Indicators  Constant    Pain Type  Chronic pain    Pain Radiating Towards  upper traps, down right arm    Pain Onset  More than a month ago    Pain Frequency  Constant    Aggravating Factors   over use, typing    Pain Relieving Factors  Heat    Effect of Pain on Daily Activities  can't sleep         [facility] PT Assessment - 10/31/17 0001  Assessment   Medical Diagnosis  Degeneration of cervical intervertebral disc    Referring Provider  Suella Broad, MD    Onset Date/Surgical Date  --   April 2019   Hand Dominance  Right    Next MD Visit  10/28/17      Home Environment   Living Environment  Private residence      Prior Function   Vocation  Full time employment    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, computer job      ROM / Strength   AROM / PROM / Strength  AROM;Strength      AROM   Overall AROM   Due to pain    AROM Assessment Site  Shoulder;Cervical    Right/Left Shoulder  Right    Right Shoulder Flexion  125 Degrees    Right Shoulder ABduction  80 Degrees    Cervical Flexion  40    Cervical Extension  10    Cervical - Right Side Bend  22    Cervical - Left Side Bend  10    Cervical - Right Rotation  38    Cervical - Left Rotation  22      Strength   Overall Strength  Due to pain    Strength Assessment Site  Shoulder    Right/Left Shoulder  Right    Right Shoulder  Flexion  3+/5    Right Shoulder ABduction  3+/5    Right Shoulder Internal Rotation  4/5    Right Shoulder External Rotation  4/5      Palpation   Palpation comment  tender to palpation along UT, medial border of scapuale, and cervical and upper thoracic paraspinals R>L                Objective measurements completed on examination: See above findings.              PT Education - 10/31/17 1747    Education Details  scapular retractions, chin tucks, corner stretch    Person(s) Educated  Patient    Methods  Explanation;Demonstration    Comprehension  Returned demonstration;Verbalized understanding          PT Long Term Goals - 11/01/17 3762      PT LONG TERM GOAL #1   Title  Patient will be independent with HEP    Time  4    Period  Weeks    Status  New      PT LONG TERM GOAL #2   Title  Patient will demonstrate 50+ degrees of cervical rotation to improve ability to scan environment and safely look behind while driving    Time  4    Period  Weeks    Status  New      PT LONG TERM GOAL #3   Title  Patient will demonstrate 4/5 or greater shoulder MMT strength to improve ability to perform functional tasks    Time  4    Period  Weeks    Status  New      PT LONG TERM GOAL #4   Title  Patient will report ability to sleep for greater than 4 hours without waking up due to pain to gain a restful night's sleep.    Time  4    Period  Weeks    Status  New             Plan - 10/31/17 1848    Clinical Impression Statement  Patient is a 62 year old female who presents to physical therapy with ongoing shoulder and neck pain. Patient noted with decreased R UE strength as well as decreased shoulder and cervical AROM. Patient tender to palpation along UT and cervical and upper thoracic paraspinals, R>L. Patient noted with rounded shoulders and forward head. Patient would benefit from skilled physical therapy to address deficits and address patient's goals.      Clinical Presentation  Stable    Clinical Decision Making  Low    Rehab Potential  Good    Clinical Impairments Affecting Rehab Potential  scheduling conflicts, may not be able to attend 2x per week     PT Frequency  2x / week    PT Duration  4 weeks    PT Treatment/Interventions  ADLs/Self Care Home Management;Iontophoresis 68m/ml Dexamethasone;Neuromuscular re-education;Passive range of motion;Manual techniques;Dry needling;Taping;Spinal Manipulations;Ultrasound;Traction;Moist Heat;Cryotherapy;Electrical Stimulation;Therapeutic exercise;Therapeutic activities;Patient/family education    PT Next Visit Plan  Postural strengthening, cervical ROM exercises,  STW/M to upper traps and cervical musculatureModalities PRN for pain relief    PT Home Exercise Plan  see patient education section    Consulted and Agree with Plan of Care  Patient       Patient will benefit from skilled therapeutic intervention in order to improve the following deficits and impairments:  Pain, Postural dysfunction, Impaired UE functional use, Decreased range of motion, Decreased strength  Visit Diagnosis: Cervicalgia - Plan: PT plan of care cert/re-cert  Chronic right shoulder pain - Plan: PT plan of care cert/re-cert  Muscle weakness (generalized) - Plan: PT plan of care cert/re-cert     Problem List Patient Active Problem List   Diagnosis Date Noted  . Internal hemorrhoids 06/30/2014  . Rectal bleeding 09/20/2013  . Personal history of colon cancer, stage I 09/20/2013  . HELICOBACTER PYLORI INFECTION 10/20/2009  . STRICTURE AND STENOSIS OF ESOPHAGUS 10/20/2009  . DYSPHAGIA UNSPECIFIED 07/29/2009  . RECTAL BLEEDING 06/26/2009  . ANAL OR RECTAL PAIN 06/26/2009  . DM 08/27/2008  . NONSPECIFIC ABNORMAL RESULTS LIVR FUNCTION STUDY 08/27/2008  . NAUSEA 08/04/2008  . ABDOMINAL PAIN-RUQ 08/04/2008  . History of malignant neoplasm of large intestine 08/04/2008  . GASTROESOPHAGEAL REFLUX DISEASE, CHRONIC  06/06/2007  . HEPATOMEGALY 06/06/2007  . [place] 10/12/2006   KGabriela Eves PT, DPT 11/01/2017, 6:57 AM  [facility] [address] [address]Phone: [phone]  Fax:  [phone] Name: Stephanie CoronaWard MRN: [id number] of Birth: 510/01/1956

## 2017-11-06 DIAGNOSIS — G5601 Carpal tunnel syndrome, right upper limb: Secondary | ICD-10-CM | POA: Insufficient documentation

## 2017-11-07 ENCOUNTER — Encounter: Payer: PRIVATE HEALTH INSURANCE | Admitting: Physical Therapy

## 2017-11-16 ENCOUNTER — Encounter: Payer: PRIVATE HEALTH INSURANCE | Admitting: Physical Therapy

## 2017-11-21 ENCOUNTER — Ambulatory Visit: Payer: PRIVATE HEALTH INSURANCE | Attending: Physical Medicine and Rehabilitation | Admitting: Physical Therapy

## 2017-11-21 ENCOUNTER — Encounter: Payer: Self-pay | Admitting: Physical Therapy

## 2017-11-21 DIAGNOSIS — M6281 Muscle weakness (generalized): Secondary | ICD-10-CM

## 2017-11-21 DIAGNOSIS — M542 Cervicalgia: Secondary | ICD-10-CM

## 2017-11-21 DIAGNOSIS — M25511 Pain in right shoulder: Secondary | ICD-10-CM | POA: Insufficient documentation

## 2017-11-21 DIAGNOSIS — G8929 Other chronic pain: Secondary | ICD-10-CM | POA: Diagnosis present

## 2017-11-21 NOTE — Therapy (Addendum)
[facility] [place], [place], [address] Phone: [phone]   Fax:  [phone]  Physical Therapy Treatment/Discharge  PHYSICAL THERAPY DISCHARGE SUMMARY  Visits from Start of Care: 2  Current functional level related to goals / functional outcomes: See below   Remaining deficits: Goals not met   Education / Equipment: HEP   Plan: Patient agrees to discharge.  Patient goals were not met. Patient is being discharged due to not returning since the last visit.  ?????    Gabriela Eves, PT, DPT 04/11/18   Patient Details  Name: Stephanie Carroll MRN: [id number] Date of Birth: Nov 25, 1955 Referring Provider (PT): Suella Broad, MD   Encounter Date: 11/21/2017  PT End of Session - 11/21/17 1742    Visit Number  2    Number of Visits  8    Date for PT Re-Evaluation  12/05/17    PT Start Time  0017    PT Stop Time  4944    PT Time Calculation (min)  55 min    Activity Tolerance  Patient limited by pain;Patient tolerated treatment well    Behavior During Therapy  [facility] for tasks assessed/performed       Past Medical History:  Diagnosis Date  . Arthritis    DDD  . Asthma   . Colon cancer ([place]) 2008  . Diabetes mellitus   . GERD (gastroesophageal reflux disease)   . Thyroid tumor, benign     Past Surgical History:  Procedure Laterality Date  . BIOPSY THYROID     X2  . CESAREAN SECTION     x 1  . COLON SURGERY    . FOOT FRACTURE SURGERY Left 02/2015  . INCISIONAL HERNIA REPAIR    . NASAL SEPTUM SURGERY    . PARTIAL HYSTERECTOMY    . POLYPECTOMY    . UPPER GASTROINTESTINAL ENDOSCOPY    . WRIST GANGLION EXCISION     left    There were no vitals filed for this visit.  Subjective Assessment - 11/21/17 1650    Subjective  Patient reported 4/10 pain;  and stated she's stopped HEP as they made her neck stiffen up.    Pertinent History  DM, asthma, OP    Limitations  Sitting;Other (comment);Lifting     Diagnostic tests  NCS: 11/06/17 "nerve damage in distal arm"    Patient Stated Goals  do my job without pain, sleep longer than 4 hours    Currently in Pain?  Yes    Pain Score  4     Pain Orientation  Posterior    Pain Descriptors / Indicators  Constant;Sore    Pain Type  Chronic pain    Pain Onset  More than a month ago    Pain Frequency  Constant         [facility] PT Assessment - 11/21/17 0001      Assessment   Medical Diagnosis  Degeneration of cervical intervertebral disc    Referring Provider (PT)  Suella Broad, MD    Hand Dominance  Right                   [facility] Adult PT Treatment/Exercise - 11/21/17 0001      Exercises   Exercises  Neck;Shoulder      Shoulder Exercises: Pulleys   Flexion  5 minutes      Modalities   Modalities  Electrical Stimulation;Moist Heat      Moist Heat Therapy   Number Minutes  Moist Heat  15 Minutes    Moist Heat Location  Cervical      Electrical Stimulation   Electrical Stimulation Location  cervical UTs and cervical paraspinals    Electrical Stimulation Action  IFC    Electrical Stimulation Parameters  80-150 hz x15 min    Electrical Stimulation Goals  Tone;Pain      Manual Therapy   Manual Therapy  Soft tissue mobilization    Soft tissue mobilization  STW/M to bilateral UT and cervical paraspinals to decrease pain and tone.                  PT Long Term Goals - 11/01/17 2951      PT LONG TERM GOAL #1   Title  Patient will be independent with HEP    Time  4    Period  Weeks    Status  New      PT LONG TERM GOAL #2   Title  Patient will demonstrate 50+ degrees of cervical rotation to improve ability to scan environment and safely look behind while driving    Time  4    Period  Weeks    Status  New      PT LONG TERM GOAL #3   Title  Patient will demonstrate 4/5 or greater shoulder MMT strength to improve ability to perform functional tasks    Time  4    Period  Weeks    Status  New      PT LONG  TERM GOAL #4   Title  Patient will report ability to sleep for greater than 4 hours without waking up due to pain to gain a restful night's sleep.    Time  4    Period  Weeks    Status  New            Plan - 11/21/17 1747    Clinical Impression Statement  Patient was able to tolerate treatment fairly. Patient reported increase of pain with pulley in UT R>L. STW/M performed to decrease pain and tone. Patient noted with moderate UT tone and noted when pt would flex shoulder, UT would activate. Patient educated importance of decreasing UT activation to decrease pain. Patient reported understanding. Patient also discussed not to apply heat for greater than 20 minutes as patient has been applying a heat pack for long periods of time while she would work. Patient reported understanding and agreement. Normal response to modalities upon removal.     Clinical Presentation  Stable    Clinical Decision Making  Low    Rehab Potential  Good    Clinical Impairments Affecting Rehab Potential  scheduling conflicts, may not be able to attend 2x per week     PT Frequency  2x / week    PT Treatment/Interventions  ADLs/Self Care Home Management;Iontophoresis 44m/ml Dexamethasone;Neuromuscular re-education;Passive range of motion;Manual techniques;Dry needling;Taping;Spinal Manipulations;Ultrasound;Traction;Moist Heat;Cryotherapy;Electrical Stimulation;Therapeutic exercise;Therapeutic activities;Patient/family education    PT Next Visit Plan  Postural strengthening, cervical ROM exercises,  STW/M to upper traps and cervical musculatureModalities PRN for pain relief    Consulted and Agree with Plan of Care  Patient       Patient will benefit from skilled therapeutic intervention in order to improve the following deficits and impairments:  Pain, Postural dysfunction, Impaired UE functional use, Decreased range of motion, Decreased strength  Visit Diagnosis: Cervicalgia  Chronic right shoulder pain  Muscle  weakness (generalized)     Problem List Patient Active Problem List  Diagnosis Date Noted  . Internal hemorrhoids 06/30/2014  . Rectal bleeding 09/20/2013  . Personal history of colon cancer, stage I 09/20/2013  . HELICOBACTER PYLORI INFECTION 10/20/2009  . STRICTURE AND STENOSIS OF ESOPHAGUS 10/20/2009  . DYSPHAGIA UNSPECIFIED 07/29/2009  . RECTAL BLEEDING 06/26/2009  . ANAL OR RECTAL PAIN 06/26/2009  . DM 08/27/2008  . NONSPECIFIC ABNORMAL RESULTS LIVR FUNCTION STUDY 08/27/2008  . NAUSEA 08/04/2008  . ABDOMINAL PAIN-RUQ 08/04/2008  . History of malignant neoplasm of large intestine 08/04/2008  . GASTROESOPHAGEAL REFLUX DISEASE, CHRONIC 06/06/2007  . HEPATOMEGALY 06/06/2007  . Malignant neoplasm of ascending colon ([place]) 10/12/2006    Gabriela Eves, PT, DPT 11/21/2017, 8:18 PM  [facility] [address] [place], [place], [address] Phone: [phone]   Fax:  [phone]  Name: Stephanie Carroll MRN: [id number] Date of Birth: 1955-06-15

## 2017-11-23 ENCOUNTER — Encounter: Payer: PRIVATE HEALTH INSURANCE | Admitting: Physical Therapy

## 2018-02-23 ENCOUNTER — Other Ambulatory Visit: Payer: Self-pay | Admitting: Gastroenterology

## 2018-03-09 ENCOUNTER — Ambulatory Visit: Payer: PRIVATE HEALTH INSURANCE | Admitting: Physician Assistant

## 2018-04-20 ENCOUNTER — Encounter: Payer: Self-pay | Admitting: Hematology & Oncology

## 2018-04-20 ENCOUNTER — Other Ambulatory Visit: Payer: Self-pay

## 2018-04-20 ENCOUNTER — Inpatient Hospital Stay: Payer: PRIVATE HEALTH INSURANCE | Attending: Hematology & Oncology | Admitting: Hematology & Oncology

## 2018-04-20 ENCOUNTER — Inpatient Hospital Stay: Payer: PRIVATE HEALTH INSURANCE

## 2018-04-20 ENCOUNTER — Telehealth: Payer: Self-pay | Admitting: Hematology & Oncology

## 2018-04-20 VITALS — BP 146/74 | HR 116 | Temp 98.0°F | Resp 19 | Wt 147.0 lb

## 2018-04-20 DIAGNOSIS — Z85038 Personal history of other malignant neoplasm of large intestine: Secondary | ICD-10-CM | POA: Diagnosis not present

## 2018-04-20 DIAGNOSIS — E119 Type 2 diabetes mellitus without complications: Secondary | ICD-10-CM

## 2018-04-20 DIAGNOSIS — C182 Malignant neoplasm of ascending colon: Secondary | ICD-10-CM

## 2018-04-20 LAB — CMP (CANCER CENTER ONLY)
ALBUMIN: 4.8 g/dL (ref 3.5–5.0)
ALT: 94 U/L — ABNORMAL HIGH (ref 0–44)
ANION GAP: 14 (ref 5–15)
AST: 100 U/L — ABNORMAL HIGH (ref 15–41)
Alkaline Phosphatase: 79 U/L (ref 38–126)
BUN: 9 mg/dL (ref 6–20)
CO2: 26 mmol/L (ref 22–32)
Calcium: 9.9 mg/dL (ref 8.9–10.3)
Chloride: 100 mmol/L (ref 98–111)
Creatinine: 0.73 mg/dL (ref 0.44–1.00)
GFR, Est AFR Am: >60 mL/min (ref >60)
GFR, Estimated: >60 mL/min (ref >60)
GLUCOSE: 222 mg/dL — AB (ref 70–99)
POTASSIUM: 3.8 mmol/L (ref 3.5–5.1)
SODIUM: 140 mmol/L (ref 135–145)
Total Bilirubin: 0.3 mg/dL (ref 0.3–1.2)
Total Protein: 8.1 g/dL (ref 6.5–8.1)

## 2018-04-20 LAB — CBC WITH DIFFERENTIAL (CANCER CENTER ONLY)
Abs Immature Granulocytes: 0.06 10*3/uL (ref 0.00–0.07)
BASOS ABS: 0.1 10*3/uL (ref 0.0–0.1)
BASOS PCT: 1 %
EOS ABS: 0.3 10*3/uL (ref 0.0–0.5)
EOS PCT: 2 %
HCT: 40 % (ref 36.0–46.0)
Hemoglobin: 12.6 g/dL (ref 12.0–15.0)
Immature Granulocytes: 1 %
LYMPHS PCT: 28 %
Lymphs Abs: 3.2 10*3/uL (ref 0.7–4.0)
MCH: 27.2 pg (ref 26.0–34.0)
MCHC: 31.5 g/dL (ref 30.0–36.0)
MCV: 86.2 fL (ref 80.0–100.0)
Monocytes Absolute: 0.7 10*3/uL (ref 0.1–1.0)
Monocytes Relative: 6 %
NRBC: 0 % (ref 0.0–0.2)
Neutro Abs: 7.2 10*3/uL (ref 1.7–7.7)
Neutrophils Relative %: 62 %
PLATELETS: 345 10*3/uL (ref 150–400)
RBC: 4.64 MIL/uL (ref 3.87–5.11)
RDW: 14.1 % (ref 11.5–15.5)
WBC: 11.5 10*3/uL — AB (ref 4.0–10.5)

## 2018-04-20 NOTE — Progress Notes (Signed)
Hematology and Oncology Follow Up Visit  Stephanie Carroll [id number] [birth year] 63 y.o. 04/20/2018   Principle Diagnosis:  Stage II carcinoma of colon  Current Therapy:   Observation   Interim History:  Stephanie Carroll is here today for follow-up.  We see her yearly.  She has gained little bit of weight since we last saw her.  She is taking CBD oil daily.  She says this has helped her.  She has adopted a nephew.  I think this is wonderful that she could do this.  Her last CEA level from year ago was 1.78.  She is having some issues with her eyes.  She has to see a ophthalmologist at [facility] but apparently that appointment is set up for May.  That is ridiculous.  She was told that she may have a tumor in the eye.  She has had no change in bowel or bladder habits.  She has had no bleeding.  There is been no cough.  Overall, her performance status is ECOG 1.    Medications:  Allergies as of 04/20/2018      Reactions   Fluticasone-salmeterol Other (See Comments), Palpitations   Morphine Other (See Comments), Palpitations   REACTION: heart racing   Peanut-containing Drug Products Cough   Penicillins Rash   REACTION: rash   Sulfa Antibiotics Other (See Comments)   Cyclobenzaprine Other (See Comments)   Loracarbef Other (See Comments)   Derm.   Metronidazole    Moxifloxacin Hcl In Nacl    Other    Oats and Green peppers---headaches   Peanuts [peanut Oil] Other (See Comments)   congested   Penicillin G Sodium Other (See Comments)   Derm.   Sulfamethazine    Sulfamethoxazole Nausea Only   Sulfonamide Derivatives    Ciprofloxacin Anxiety, Other (See Comments)   Clindamycin Rash      Medication List       Accurate as of April 20, 2018  2:56 PM. Always use your most recent med list.        albuterol 108 (90 Base) MCG/ACT inhaler Commonly known as:  PROVENTIL HFA;VENTOLIN HFA Inhale 1 puff into the lungs every 6 (six) hours as needed. For shortness for breath   BLACK ELDERBERRY  PO Take 1 tablet by mouth daily.   Cannabidiol 100 MG/ML Soln Take by mouth.   Cetirizine HCl 10 MG Caps Take 10 mg by mouth as needed.   cholecalciferol 25 MCG (1000 UT) tablet Commonly known as:  VITAMIN D Take 1,000 Units by mouth daily.   clarithromycin 500 MG tablet Commonly known as:  BIAXIN Take 500 mg by mouth 2 (two) times daily.   EPINEPHrine 0.3 mg/0.3 mL Soaj injection Commonly known as:  EPI-PEN as needed.   guaiFENesin 200 MG tablet Take 400 mg by mouth 2 (two) times daily.   Lantus SoloStar 100 UNIT/ML Solostar Pen Generic drug:  Insulin Glargine Inject 50 Units into the skin daily.   metFORMIN 500 MG 24 hr tablet Commonly known as:  GLUCOPHAGE-XR Take 1,000 mg by mouth 2 (two) times daily.   mometasone 220 MCG/INH inhaler Commonly known as:  ASMANEX Inhale 1 puff into the lungs 2 (two) times daily.   Proctosol HC 2.5 % rectal cream Generic drug:  hydrocortisone PLACE 1 APPLICATION RECTALLY 2 (TWO) TIMES DAILY.   RABEprazole 20 MG tablet Commonly known as:  ACIPHEX TAKE 1 TABLET BY MOUTH TWICE DAILY   UNABLE TO FIND Med Name: CBD oil PO  Allergies:  Allergies  Allergen Reactions  . Fluticasone-Salmeterol Other (See Comments) and Palpitations  . Morphine Other (See Comments) and Palpitations    REACTION: heart racing  . Peanut-Containing Drug Products Cough  . Penicillins Rash    REACTION: rash  . Sulfa Antibiotics Other (See Comments)  . Cyclobenzaprine Other (See Comments)  . Loracarbef Other (See Comments)    Derm.   . Metronidazole   . Moxifloxacin Hcl In Nacl   . Other     Oats and Green peppers---headaches  . Peanuts [Peanut Oil] Other (See Comments)    congested  . Penicillin G Sodium Other (See Comments)    Derm.  . Sulfamethazine   . Sulfamethoxazole Nausea Only  . Sulfonamide Derivatives   . Ciprofloxacin Anxiety and Other (See Comments)  . Clindamycin Rash    Past Medical History, Surgical history, Social  history, and Family History were reviewed and updated.  Review of Systems: Review of Systems  Constitutional: Negative.   HENT: Negative.   Eyes: Positive for blurred vision.  Respiratory: Negative.   Cardiovascular: Negative.   Gastrointestinal: Negative.   Genitourinary: Negative.   Musculoskeletal: Negative.   Skin: Negative.   Neurological: Negative.   Endo/Heme/Allergies: Negative.   Psychiatric/Behavioral: Negative.       Physical Exam:  weight is 147 lb (66.7 kg). Her oral temperature is 98 F (36.7 C). Her blood pressure is 146/74 (abnormal) and her pulse is 116 (abnormal). Her respiration is 19 and oxygen saturation is 97%.   Wt Readings from Last 3 Encounters:  04/20/18 147 lb (66.7 kg)  04/21/17 137 lb (62.1 kg)  04/22/16 142 lb 4 oz (64.5 kg)    Physical Exam Vitals signs reviewed.  HENT:     Head: Normocephalic and atraumatic.  Eyes:     Pupils: Pupils are equal, round, and reactive to light.  Neck:     Musculoskeletal: Normal range of motion.  Cardiovascular:     Rate and Rhythm: Normal rate and regular rhythm.     Heart sounds: Normal heart sounds.  Pulmonary:     Effort: Pulmonary effort is normal.     Breath sounds: Normal breath sounds.  Abdominal:     General: Bowel sounds are normal.     Palpations: Abdomen is soft.  Musculoskeletal: Normal range of motion.        General: No tenderness or deformity.  Lymphadenopathy:     Cervical: No cervical adenopathy.  Skin:    General: Skin is warm and dry.     Findings: No erythema or rash.  Neurological:     Mental Status: She is alert and oriented to person, place, and time.  Psychiatric:        Behavior: Behavior normal.        Thought Content: Thought content normal.        Judgment: Judgment normal.      Lab Results  Component Value Date   WBC 11.5 (H) 04/20/2018   HGB 12.6 04/20/2018   HCT 40.0 04/20/2018   MCV 86.2 04/20/2018   PLT 345 04/20/2018   Lab Results  Component Value  Date   FERRITIN 201.3 08/08/2008   IRON 58 08/08/2008   IRONPCTSAT 13.3 (L) 08/08/2008   Lab Results  Component Value Date   RETICCTPCT 2.0 03/01/2007   RBC 4.64 04/20/2018   RETICCTABS 101.4 03/01/2007   No results found for: KPAFRELGTCHN, LAMBDASER, KAPLAMBRATIO No results found for: IGGSERUM, IGA, IGMSERUM No results found for: TOTALPROTELP, ALBUMINELP, A1GS, A2GS,  Tillman Sers, SPEI   Chemistry      Component Value Date/Time   NA 140 04/20/2018 1414   NA 126 (L) 04/22/2016 1456   NA 141 11/29/2013 1454   K 3.8 04/20/2018 1414   K 4.1 04/22/2016 1456   K 3.8 11/29/2013 1454   CL 100 04/20/2018 1414   CL 95 (L) 04/22/2016 1456   CL 98 11/29/2013 1454   CO2 26 04/20/2018 1414   CO2 21 04/22/2016 1456   CO2 25 11/29/2013 1454   BUN 9 04/20/2018 1414   BUN 6 04/22/2016 1456   BUN 12 11/29/2013 1454   CREATININE 0.73 04/20/2018 1414   CREATININE 0.55 (L) 04/22/2016 1456   CREATININE 0.8 11/29/2013 1454      Component Value Date/Time   CALCIUM 9.9 04/20/2018 1414   CALCIUM 9.5 04/22/2016 1456   CALCIUM 9.2 11/29/2013 1454   ALKPHOS 79 04/20/2018 1414   ALKPHOS 103 04/22/2016 1456   ALKPHOS 93 (H) 11/29/2013 1454   AST 100 (H) 04/20/2018 1414   ALT 94 (H) 04/20/2018 1414   ALT 36 11/29/2013 1454   BILITOT 0.3 04/20/2018 1414      Impression and Plan: Ms. Charley is a very pleasant 63 yo caucasian female with history of 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 She did not require any adjuvant therapy.   Again, her LFTs are elevated.  I would have to think that this probably is from her taking the CBD oil.  However, we do have to do an ultrasound of her liver to make sure that nothing else is going on.  She is diabetic.  She may have hepatic steatosis from the diabetes.  I will still plan to get her back in 1 year.  I would like to repeat her liver function studies in 1 month.  Hopefully everything will look  better.  If the LFTs are better in 1 month, then she probably needs to ease up on the CBD oil.     Volanda Napoleon, MD 3/6/20202:56 PM

## 2018-04-20 NOTE — Telephone Encounter (Signed)
Appointments scheduled avs/calendar printed per 3/6 los

## 2018-04-21 ENCOUNTER — Ambulatory Visit (HOSPITAL_BASED_OUTPATIENT_CLINIC_OR_DEPARTMENT_OTHER)
Admission: RE | Admit: 2018-04-21 | Discharge: 2018-04-21 | Disposition: A | Discharge status: Home / Self Care | Payer: PRIVATE HEALTH INSURANCE | Source: Ambulatory Visit | Attending: Hematology & Oncology | Admitting: Hematology & Oncology

## 2018-04-21 DIAGNOSIS — C182 Malignant neoplasm of ascending colon: Secondary | ICD-10-CM | POA: Insufficient documentation

## 2018-04-23 ENCOUNTER — Telehealth: Payer: Self-pay | Admitting: *Deleted

## 2018-04-23 LAB — CEA (IN HOUSE-CHCC): CEA (CHCC-In House): 1.81 ng/mL (ref 0.00–5.00)

## 2018-04-23 NOTE — Telephone Encounter (Addendum)
-----   Message from Volanda Napoleon, MD sent at 04/23/2018  7:16 AM EDT ----- Called patient. No cancer in the liver.  Looks like there is fatty infiltration of the liver that is the likely cause of the elevated liver tests.

## 2018-05-18 ENCOUNTER — Other Ambulatory Visit: Payer: Self-pay

## 2018-05-18 ENCOUNTER — Inpatient Hospital Stay: Payer: PRIVATE HEALTH INSURANCE | Attending: Hematology & Oncology

## 2018-05-18 DIAGNOSIS — C182 Malignant neoplasm of ascending colon: Secondary | ICD-10-CM | POA: Diagnosis present

## 2018-05-18 LAB — CBC WITH DIFFERENTIAL (CANCER CENTER ONLY)
Abs Immature Granulocytes: 0.04 10*3/uL (ref 0.00–0.07)
Basophils Absolute: 0.1 10*3/uL (ref 0.0–0.1)
Basophils Relative: 1 %
Eosinophils Absolute: 0.2 10*3/uL (ref 0.0–0.5)
Eosinophils Relative: 2 %
HCT: 37.9 % (ref 36.0–46.0)
Hemoglobin: 12.3 g/dL (ref 12.0–15.0)
Immature Granulocytes: 0 %
Lymphocytes Relative: 29 %
Lymphs Abs: 2.9 10*3/uL (ref 0.7–4.0)
MCH: 27.6 pg (ref 26.0–34.0)
MCHC: 32.5 g/dL (ref 30.0–36.0)
MCV: 85.2 fL (ref 80.0–100.0)
Monocytes Absolute: 0.6 10*3/uL (ref 0.1–1.0)
Monocytes Relative: 6 %
Neutro Abs: 6.2 10*3/uL (ref 1.7–7.7)
Neutrophils Relative %: 62 %
Platelet Count: 291 10*3/uL (ref 150–400)
RBC: 4.45 MIL/uL (ref 3.87–5.11)
RDW: 14.2 % (ref 11.5–15.5)
WBC Count: 10 10*3/uL (ref 4.0–10.5)
nRBC: 0 % (ref 0.0–0.2)

## 2018-05-18 LAB — CMP (CANCER CENTER ONLY)
ALT: 85 U/L — ABNORMAL HIGH (ref 0–44)
AST: 92 U/L — ABNORMAL HIGH (ref 15–41)
Albumin: 4.5 g/dL (ref 3.5–5.0)
Alkaline Phosphatase: 75 U/L (ref 38–126)
Anion gap: 12 (ref 5–15)
BUN: 9 mg/dL (ref 6–20)
CO2: 26 mmol/L (ref 22–32)
Calcium: 9.7 mg/dL (ref 8.9–10.3)
Chloride: 100 mmol/L (ref 98–111)
Creatinine: 0.66 mg/dL (ref 0.44–1.00)
GFR, Est AFR Am: >60 mL/min (ref >60)
GFR, Estimated: >60 mL/min (ref >60)
Glucose, Bld: 200 mg/dL — ABNORMAL HIGH (ref 70–99)
Potassium: 4.4 mmol/L (ref 3.5–5.1)
Sodium: 138 mmol/L (ref 135–145)
Total Bilirubin: 0.3 mg/dL (ref 0.3–1.2)
Total Protein: 7.6 g/dL (ref 6.5–8.1)

## 2018-05-23 ENCOUNTER — Telehealth: Payer: Self-pay | Admitting: Gastroenterology

## 2018-05-23 ENCOUNTER — Telehealth: Payer: Self-pay | Admitting: *Deleted

## 2018-05-23 NOTE — Telephone Encounter (Signed)
Appointment scheduled for Wed 05-30-2018 at 11am with Dr Silverio Decamp.

## 2018-05-23 NOTE — Telephone Encounter (Signed)
Call placed to patient and patient notified per order of Dr. Marin Olp that AST and ALT remain elevated, there is no cancer in the liver and for pt to contact her PCP regarding elevated liver enzymes.  Pt appreciative of call back and states that she will contact her PCP and GI doctor.

## 2018-05-23 NOTE — Telephone Encounter (Signed)
Labs and imaging are in her chart. Please advise.

## 2018-05-23 NOTE — Telephone Encounter (Signed)
Schedule virtual office visit, next available appointment to further discuss management and address her concerns. Please advise patient to avoid NSAIDs, over-the-counter herbal remedies and alcohol.

## 2018-05-30 ENCOUNTER — Other Ambulatory Visit: Payer: Self-pay

## 2018-05-30 ENCOUNTER — Ambulatory Visit (INDEPENDENT_AMBULATORY_CARE_PROVIDER_SITE_OTHER): Payer: PRIVATE HEALTH INSURANCE | Admitting: Gastroenterology

## 2018-05-30 ENCOUNTER — Encounter: Payer: Self-pay | Admitting: Gastroenterology

## 2018-05-30 VITALS — Ht 61.0 in | Wt 147.0 lb

## 2018-05-30 DIAGNOSIS — K760 Fatty (change of) liver, not elsewhere classified: Secondary | ICD-10-CM

## 2018-05-30 DIAGNOSIS — R945 Abnormal results of liver function studies: Secondary | ICD-10-CM

## 2018-05-30 DIAGNOSIS — Z86010 Personal history of colonic polyps: Secondary | ICD-10-CM

## 2018-05-30 DIAGNOSIS — Z85038 Personal history of other malignant neoplasm of large intestine: Secondary | ICD-10-CM

## 2018-05-30 DIAGNOSIS — R7989 Other specified abnormal findings of blood chemistry: Secondary | ICD-10-CM

## 2018-05-30 NOTE — Progress Notes (Signed)
Stephanie Carroll    [id number]    [birth year]  Primary Care Physician:Pasquini, Kathryne Eriksson, RN  Referring Physician: No referring provider defined for this encounter.  This service was provided via audio and video telemedicine (webex) due to Gray 19 pandemic.  Patient location: Home Provider location: Office Used 2 patient identifiers to confirm the correct person. Explained the limitations in evaluation and management via telemedicine. Patient is aware of potential medical charges for this visit.  Patient consented to this virtual visit.  The persons participating in this telemedicine service were myself and the patient   Chief complaint: Abnormal LFT HPI: 63 year old female with history of diabetes with elevated transaminases. Hepatic Function Latest Ref Rng & Units 05/18/2018 04/20/2018 04/21/2017  Total Protein 6.5 - 8.1 g/dL 7.6 8.1 7.9  Albumin 3.5 - 5.0 g/dL 4.5 4.8 3.9  AST 15 - 41 U/L 92(H) 100(H) 29  ALT 0 - 44 U/L 85(H) 94(H) 43  Alk Phosphatase 38 - 126 U/L 75 79 92(H)  Total Bilirubin 0.3 - 1.2 mg/dL 0.3 0.3 0.5  Bilirubin, Direct 0.0 - 0.3 mg/dL - - -   She was using CBD oil which was helping her with her diabetes related symptoms.  Her hemoglobin A1c improved from 11-8. After she was noted to have elevated transaminases, stop taking the CBD oil. In February she was taking antibiotics Biaxin and subsequently took Diflucan for yeast infection Denies alcohol use or any other herbal remedies No jaundice, dark urine, fever, myalgia, abdominal pain, melena or blood per rectum.  Abdominal ultrasound showed complex right lower pole renal cyst without worrisome features, steatosis of liver otherwise unremarkable exam.  Outpatient Encounter Medications as of 05/30/2018  Medication Sig  . albuterol (PROVENTIL HFA;VENTOLIN HFA) 108 (90 BASE) MCG/ACT inhaler Inhale 1 puff into the lungs every 6 (six) hours as needed. For shortness for breath  . BLACK ELDERBERRY PO Take 1  tablet by mouth daily.  . Cannabidiol 100 MG/ML SOLN Take by mouth.  . Cetirizine HCl 10 MG CAPS Take 10 mg by mouth as needed.  . cholecalciferol (VITAMIN D) 25 MCG (1000 UT) tablet Take 1,000 Units by mouth daily.  . clarithromycin (BIAXIN) 500 MG tablet Take 500 mg by mouth 2 (two) times daily.  Marland Kitchen EPINEPHrine 0.3 mg/0.3 mL IJ SOAJ injection as needed.  Marland Kitchen guaiFENesin 200 MG tablet Take 400 mg by mouth 2 (two) times daily.  . Insulin Glargine (LANTUS SOLOSTAR) 100 UNIT/ML Solostar Pen Inject 50 Units into the skin daily.  . metFORMIN (GLUCOPHAGE-XR) 500 MG 24 hr tablet Take 1,000 mg by mouth 2 (two) times daily.  . mometasone (ASMANEX) 220 MCG/INH inhaler Inhale 1 puff into the lungs 2 (two) times daily.    Marland Kitchen PROCTOSOL HC 2.5 % rectal cream PLACE 1 APPLICATION RECTALLY 2 (TWO) TIMES DAILY.  . RABEprazole (ACIPHEX) 20 MG tablet TAKE 1 TABLET BY MOUTH TWICE DAILY  . UNABLE TO FIND Med Name: CBD oil PO  . [DISCONTINUED] 0.9 %  sodium chloride infusion    No facility-administered encounter medications on file as of 05/30/2018.     Allergies as of 05/30/2018 - Review Complete 05/30/2018  Allergen Reaction Noted  . Fluticasone-salmeterol Other (See Comments) and Palpitations 09/28/2007  . Morphine Other (See Comments) and Palpitations 09/28/2007  . Peanut-containing drug products Cough 07/15/2010  . Penicillins Rash 09/28/2007  . Sulfa antibiotics Other (See Comments)   . Cyclobenzaprine Other (See Comments) 03/23/2015  . Loracarbef  Other (See Comments) 07/05/2010  . Metronidazole  08/27/2009  . Moxifloxacin hcl in nacl  07/05/2010  . Other  10/14/2011  . Peanuts [peanut oil] Other (See Comments) 10/14/2011  . Penicillin g sodium Other (See Comments) 10/27/2011  . Sulfamethazine  07/05/2010  . Sulfamethoxazole Nausea Only 03/23/2015  . Sulfonamide derivatives    . Ciprofloxacin Anxiety and Other (See Comments) 03/23/2015  . Clindamycin Rash 07/31/2015    Past Medical History:   Diagnosis Date  . Arthritis    DDD  . Asthma   . Colon cancer ([place]) 2008  . Diabetes mellitus   . Elevated liver enzymes   . GERD (gastroesophageal reflux disease)   . Thyroid tumor, benign     Past Surgical History:  Procedure Laterality Date  . BIOPSY THYROID     X2  . CESAREAN SECTION     x 1  . COLON SURGERY    . FOOT FRACTURE SURGERY Left 02/2015  . INCISIONAL HERNIA REPAIR    . NASAL SEPTUM SURGERY    . PARTIAL HYSTERECTOMY    . POLYPECTOMY    . UPPER GASTROINTESTINAL ENDOSCOPY    . WRIST GANGLION EXCISION     left    Family History  Problem Relation Age of Onset  . COPD Other        both sides  . Diabetes Other        both sides  . Heart disease Father   . Kidney failure Father   . Lung cancer Paternal Uncle   . Leukemia Paternal Grandfather   . Cervical cancer Paternal Aunt        x 2   . Heart defect Mother        MVP  . Breast cancer Mother   . Colon cancer Neg Hx     Social History   Socioeconomic History  . Marital status: Married    Spouse name: Not on file  . Number of children: 2  . Years of education: Not on file  . Highest education level: Not on file  Occupational History  . Occupation: claims for [employer]    Employer: [employer]  . Financial resource strain: Not on file  . Food insecurity:    Worry: Not on file    Inability: Not on file  . Transportation needs:    Medical: Not on file    Non-medical: Not on file  Tobacco Use  . Smoking status: Never Smoker  . Smokeless tobacco: Never Used  . Tobacco comment: never used product  Substance and Sexual Activity  . Alcohol use: No    Alcohol/week: 0.0 standard drinks  . Drug use: No  . Sexual activity: Not on file  Lifestyle  . Physical activity:    Days per week: Not on file    Minutes per session: Not on file  . Stress: Not on file  Relationships  . Social connections:    Talks on phone: Not on file    Gets together: Not on file    Attends religious service:  Not on file    Active member of club or organization: Not on file    Attends meetings of clubs or organizations: Not on file    Relationship status: Not on file  . Intimate partner violence:    Fear of current or ex partner: Not on file    Emotionally abused: Not on file    Physically abused: Not on file    Forced sexual  activity: Not on file  Other Topics Concern  . Not on file  Social History Narrative  . Not on file      Review of systems: Review of Systems as per HPI All other systems reviewed and are negative.   Physical Exam: Vitals were not taken and physical exam was not performed during this virtual visit.  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 and possible additional drug-induced liver injury  Check LFT, alpha-1 antitrypsin, ANA, anti-smooth muscle antibody, antimitochondrial antibody, ferritin, ceruloplasmin, hepatitis A IgG total and IgM, hepatitis B surface antibody, TTG IgA antibody and IgA level to exclude underlying chronic liver disease other than [place]  Please place a standing order for LFT every 4 weeks X for next 3 months to trend LFTs until they normalize  Continue to hold taking CBD oil  Avoid any over-the-counter herbal remedies and NSAIDs  Avoid alcohol  Diet and exercise with better blood glucose control  History of colon cancer 2008 status post resection, last colonoscopy 2018 with removal of one tubulovillous 1 tubular and 1 hyperplastic polyp.  Due for recall colonoscopy February 2021  Follow-up office visit in 2 to 3 months    K. Denzil Magnuson , MD   CC: No ref. provider found

## 2018-05-30 NOTE — Patient Instructions (Addendum)
Check LFT, alpha-1 antitrypsin, ANA, anti-smooth muscle antibody, antimitochondrial antibody, ferritin, ceruloplasmin, hepatitis A IgG total and IgM, hepatitis B surface antibody, TTG IgA antibody and IgA level, schedule labs for first week of May. The lab is open from 8AM-4PM currently, no appointment needed and they don't close for lunch.  Please place a standing order for LFT every 4 weeks X for next 3 months. You will come monthly and have your liver enzyme levels drawn.    Continue to hold taking CBD oil  Avoid any over-the-counter herbal remedies and NSAIDs  Avoid alcohol  Follow-up office visit in 2 to 3 months, please call us mid-May to set this up.   I appreciate the opportunity to care for you.

## 2018-06-06 ENCOUNTER — Encounter: Payer: Self-pay | Admitting: Gastroenterology

## 2018-06-19 ENCOUNTER — Other Ambulatory Visit (INDEPENDENT_AMBULATORY_CARE_PROVIDER_SITE_OTHER): Payer: PRIVATE HEALTH INSURANCE

## 2018-06-19 DIAGNOSIS — K760 Fatty (change of) liver, not elsewhere classified: Secondary | ICD-10-CM

## 2018-06-19 DIAGNOSIS — R945 Abnormal results of liver function studies: Secondary | ICD-10-CM | POA: Diagnosis not present

## 2018-06-19 DIAGNOSIS — R7989 Other specified abnormal findings of blood chemistry: Secondary | ICD-10-CM

## 2018-06-20 LAB — HEPATIC FUNCTION PANEL
ALT: 73 U/L — ABNORMAL HIGH (ref 0–35)
AST: 60 U/L — ABNORMAL HIGH (ref 0–37)
Albumin: 4.2 g/dL (ref 3.5–5.2)
Alkaline Phosphatase: 86 U/L (ref 39–117)
Bilirubin, Direct: 0 mg/dL (ref 0.0–0.3)
Total Bilirubin: 0.3 mg/dL (ref 0.2–1.2)
Total Protein: 7.4 g/dL (ref 6.0–8.3)

## 2018-06-20 LAB — IGA: IgA: 371 mg/dL (ref 68–378)

## 2018-06-20 LAB — FERRITIN: Ferritin: 26.3 ng/mL (ref 10.0–291.0)

## 2018-06-24 LAB — TISSUE TRANSGLUTAMINASE, IGA: (tTG) Ab, IgA: 1 U/mL

## 2018-06-24 LAB — CERULOPLASMIN: Ceruloplasmin: 25 mg/dL (ref 18–53)

## 2018-06-24 LAB — HEPATITIS A ANTIBODY, TOTAL: Hepatitis A AB,Total: REACTIVE — AB

## 2018-06-24 LAB — HEPATITIS B SURFACE ANTIBODY,QUALITATIVE: Hep B S Ab: NONREACTIVE

## 2018-06-24 LAB — ANA: Anti Nuclear Antibody (ANA): NEGATIVE

## 2018-06-24 LAB — MITOCHONDRIAL ANTIBODIES: Mitochondrial M2 Ab, IgG: <=20 U

## 2018-06-24 LAB — ALPHA-1-ANTITRYPSIN: A-1 Antitrypsin, Ser: 79 mg/dL — ABNORMAL LOW (ref 83–199)

## 2018-06-24 LAB — ANTI-SMOOTH MUSCLE ANTIBODY, IGG: Actin (Smooth Muscle) Antibody (IGG): <20 U (ref <20)

## 2018-06-25 ENCOUNTER — Telehealth: Payer: Self-pay | Admitting: Gastroenterology

## 2018-06-25 NOTE — Telephone Encounter (Signed)
Pt called to inquire about results from labs from last week.

## 2018-06-25 NOTE — Telephone Encounter (Signed)
The pt was advised that labs have not been reviewed as of today and we will contact her as soon as available.

## 2018-06-28 ENCOUNTER — Telehealth: Payer: Self-pay | Admitting: Gastroenterology

## 2018-06-28 NOTE — Telephone Encounter (Signed)
Pt calling for lab results, please advise. 

## 2018-06-28 NOTE — Telephone Encounter (Signed)
Pt called regarding lab results.

## 2018-06-29 ENCOUNTER — Other Ambulatory Visit: Payer: Self-pay

## 2018-06-29 DIAGNOSIS — R7989 Other specified abnormal findings of blood chemistry: Secondary | ICD-10-CM

## 2018-06-29 DIAGNOSIS — R945 Abnormal results of liver function studies: Secondary | ICD-10-CM

## 2018-06-29 NOTE — Telephone Encounter (Signed)
Called patient, discussed results.  He would like to start back CBD oil as her blood sugars have been going on since she stopped taking it and she feels it was helping her overall.  Okay to restart CBD oil but will need to monitor LFT closely. Recheck CMP in 10 days  Follow-up telemedicine visit in 4 to 6 weeks

## 2018-06-29 NOTE — Telephone Encounter (Signed)
Order and reminder in epic. Recall in epic for OV.

## 2018-07-10 ENCOUNTER — Other Ambulatory Visit (INDEPENDENT_AMBULATORY_CARE_PROVIDER_SITE_OTHER): Payer: PRIVATE HEALTH INSURANCE

## 2018-07-10 DIAGNOSIS — R945 Abnormal results of liver function studies: Secondary | ICD-10-CM | POA: Diagnosis not present

## 2018-07-10 DIAGNOSIS — K760 Fatty (change of) liver, not elsewhere classified: Secondary | ICD-10-CM

## 2018-07-10 DIAGNOSIS — R7989 Other specified abnormal findings of blood chemistry: Secondary | ICD-10-CM

## 2018-07-10 LAB — HEPATIC FUNCTION PANEL
ALT: 69 U/L — ABNORMAL HIGH (ref 0–35)
AST: 61 U/L — ABNORMAL HIGH (ref 0–37)
Albumin: 4.1 g/dL (ref 3.5–5.2)
Alkaline Phosphatase: 91 U/L (ref 39–117)
Bilirubin, Direct: 0.1 mg/dL (ref 0.0–0.3)
Total Bilirubin: 0.4 mg/dL (ref 0.2–1.2)
Total Protein: 7.3 g/dL (ref 6.0–8.3)

## 2018-07-16 ENCOUNTER — Telehealth: Payer: Self-pay | Admitting: Gastroenterology

## 2018-07-17 ENCOUNTER — Other Ambulatory Visit: Payer: Self-pay

## 2018-07-17 DIAGNOSIS — R7989 Other specified abnormal findings of blood chemistry: Secondary | ICD-10-CM

## 2018-07-17 DIAGNOSIS — R945 Abnormal results of liver function studies: Secondary | ICD-10-CM

## 2018-07-17 NOTE — Telephone Encounter (Signed)
This is about the LFT's drawn after restarting CBD oil.

## 2018-07-17 NOTE — Telephone Encounter (Signed)
No significant change, can continue the CBD oil. Recheck LFT in 4 weeks.

## 2018-07-17 NOTE — Telephone Encounter (Signed)
Spoke with the patient advised of her results and the recommendations.

## 2018-08-10 ENCOUNTER — Other Ambulatory Visit (INDEPENDENT_AMBULATORY_CARE_PROVIDER_SITE_OTHER): Payer: PRIVATE HEALTH INSURANCE

## 2018-08-10 DIAGNOSIS — R945 Abnormal results of liver function studies: Secondary | ICD-10-CM

## 2018-08-10 DIAGNOSIS — R7989 Other specified abnormal findings of blood chemistry: Secondary | ICD-10-CM

## 2018-08-10 LAB — HEPATIC FUNCTION PANEL
ALT: 94 U/L — ABNORMAL HIGH (ref 0–35)
AST: 108 U/L — ABNORMAL HIGH (ref 0–37)
Albumin: 4.4 g/dL (ref 3.5–5.2)
Alkaline Phosphatase: 97 U/L (ref 39–117)
Bilirubin, Direct: 0.1 mg/dL (ref 0.0–0.3)
Total Bilirubin: 0.5 mg/dL (ref 0.2–1.2)
Total Protein: 7.8 g/dL (ref 6.0–8.3)

## 2018-08-16 ENCOUNTER — Telehealth: Payer: Self-pay | Admitting: Gastroenterology

## 2018-08-16 ENCOUNTER — Other Ambulatory Visit: Payer: Self-pay

## 2018-08-16 DIAGNOSIS — R945 Abnormal results of liver function studies: Secondary | ICD-10-CM

## 2018-08-16 DIAGNOSIS — R7989 Other specified abnormal findings of blood chemistry: Secondary | ICD-10-CM

## 2018-08-16 NOTE — Telephone Encounter (Signed)
See result note, orders in for next lab.

## 2018-08-27 ENCOUNTER — Telehealth: Payer: Self-pay | Admitting: Gastroenterology

## 2018-08-27 NOTE — Telephone Encounter (Signed)
Patient wanted to informed the nurse that she has been so sick to the stomach that she had to take some Zofran that she had. And as well as a pain under her right breast that goes around to her back. She is wanting a call back with advice

## 2018-08-28 NOTE — Telephone Encounter (Signed)
Please schedule follow-up office visit.  Thanks

## 2018-08-28 NOTE — Telephone Encounter (Signed)
Spells of nausea accompanied by right upper abd pain that wraps into her back. GERD is "about the same as always." Admits to feeling very stressed. Due LFT's. Has Zofran. She states  "just don't understand why this keeps happening." Please advise.

## 2018-08-28 NOTE — Telephone Encounter (Signed)
Patient agrees to this plan. Appointment 09/11/18.

## 2018-09-10 ENCOUNTER — Telehealth: Payer: Self-pay

## 2018-09-10 NOTE — Telephone Encounter (Signed)
Covid-19 screening questions   Do you now or have you had a fever in the last 14 days? no  Do you have any respiratory symptoms of shortness of breath or cough now or in the last 14 days? No   Do you have any family members or close contacts with diagnosed or suspected Covid-19 in the past 14 days? No  Have you been tested for Covid-19 and found to be positive? No

## 2018-09-11 ENCOUNTER — Ambulatory Visit (INDEPENDENT_AMBULATORY_CARE_PROVIDER_SITE_OTHER): Payer: PRIVATE HEALTH INSURANCE | Admitting: Gastroenterology

## 2018-09-11 ENCOUNTER — Encounter: Payer: Self-pay | Admitting: Gastroenterology

## 2018-09-11 ENCOUNTER — Other Ambulatory Visit: Payer: PRIVATE HEALTH INSURANCE

## 2018-09-11 VITALS — BP 132/78 | HR 109 | Temp 98.5°F | Ht 61.0 in | Wt 144.0 lb

## 2018-09-11 DIAGNOSIS — R945 Abnormal results of liver function studies: Secondary | ICD-10-CM | POA: Diagnosis not present

## 2018-09-11 DIAGNOSIS — K219 Gastro-esophageal reflux disease without esophagitis: Secondary | ICD-10-CM | POA: Diagnosis not present

## 2018-09-11 DIAGNOSIS — R1013 Epigastric pain: Secondary | ICD-10-CM

## 2018-09-11 DIAGNOSIS — K7581 Nonalcoholic steatohepatitis (NASH): Secondary | ICD-10-CM

## 2018-09-11 DIAGNOSIS — R7989 Other specified abnormal findings of blood chemistry: Secondary | ICD-10-CM

## 2018-09-11 LAB — HEPATIC FUNCTION PANEL
ALT: 64 U/L — ABNORMAL HIGH (ref 0–35)
AST: 72 U/L — ABNORMAL HIGH (ref 0–37)
Albumin: 4.4 g/dL (ref 3.5–5.2)
Alkaline Phosphatase: 75 U/L (ref 39–117)
Bilirubin, Direct: 0.1 mg/dL (ref 0.0–0.3)
Total Bilirubin: 0.4 mg/dL (ref 0.2–1.2)
Total Protein: 7.7 g/dL (ref 6.0–8.3)

## 2018-09-11 NOTE — Progress Notes (Signed)
Stephanie Carroll    [id number]    1955/09/12  Primary Care Physician:Pasquini, Kathryne Eriksson, RN  Referring Physician: No referring provider defined for this encounter.   Chief complaint: Right upper quadrant discomfort HPI: 63 year old female with history of diabetes, elevated transaminases with complaints of epigastric and right upper quadrant abdominal discomfort  She restarted taking CBD oil, she thinks the ginger in it was irritating her stomach, has worsening abdominal discomfort.  Intermittent nausea but no vomiting.  No dysphagia or odynophagia.  She is trying to maintain her blood sugars within normal limits.   Hepatic Function Latest Ref Rng & Units 08/10/2018 07/10/2018 06/19/2018  Total Protein 6.0 - 8.3 g/dL 7.8 7.3 7.4  Albumin 3.5 - 5.2 g/dL 4.4 4.1 4.2  AST 0 - 37 U/L 108(H) 61(H) 60(H)  ALT 0 - 35 U/L 94(H) 69(H) 73(H)  Alk Phosphatase 39 - 117 U/L 97 91 86  Total Bilirubin 0.2 - 1.2 mg/dL 0.5 0.4 0.3  Bilirubin, Direct 0.0 - 0.3 mg/dL 0.1 0.1 0.0   Abdominal ultrasound 04/21/18 showed complex right lower pole renal cyst without worrisome features, steatosis of liver otherwise unremarkable exam.  Colonoscopy 04/06/2016   Outpatient Encounter Medications as of 09/11/2018  Medication Sig  . albuterol (PROVENTIL HFA;VENTOLIN HFA) 108 (90 BASE) MCG/ACT inhaler Inhale 1 puff into the lungs every 6 (six) hours as needed. For shortness for breath  . BLACK ELDERBERRY PO Take 1 tablet by mouth daily.  . Cetirizine HCl 10 MG CAPS Take 10 mg by mouth as needed.  . cholecalciferol (VITAMIN D) 25 MCG (1000 UT) tablet Take 1,000 Units by mouth daily.  . clarithromycin (BIAXIN) 500 MG tablet Take 500 mg by mouth 2 (two) times daily.  Marland Kitchen EPINEPHrine 0.3 mg/0.3 mL IJ SOAJ injection as needed.  Marland Kitchen guaiFENesin 200 MG tablet Take 400 mg by mouth 2 (two) times daily.  . Insulin Glargine (LANTUS SOLOSTAR) 100 UNIT/ML Solostar Pen Inject 50 Units into the skin daily.  . metFORMIN  (GLUCOPHAGE-XR) 500 MG 24 hr tablet Take 1,000 mg by mouth 2 (two) times daily.  . mometasone (ASMANEX) 220 MCG/INH inhaler Inhale 1 puff into the lungs 2 (two) times daily.    Marland Kitchen PROCTOSOL HC 2.5 % rectal cream PLACE 1 APPLICATION RECTALLY 2 (TWO) TIMES DAILY.  . RABEprazole (ACIPHEX) 20 MG tablet TAKE 1 TABLET BY MOUTH TWICE DAILY  . UNABLE TO FIND Med Name: CBD oil PO  . [DISCONTINUED] Cannabidiol 100 MG/ML SOLN Take by mouth.   No facility-administered encounter medications on file as of 09/11/2018.     Allergies as of 09/11/2018 - Review Complete 09/11/2018  Allergen Reaction Noted  . Fluticasone-salmeterol Other (See Comments) and Palpitations 09/28/2007  . Morphine Other (See Comments) and Palpitations 09/28/2007  . Peanut-containing drug products Cough 07/15/2010  . Penicillins Rash 09/28/2007  . Sulfa antibiotics Other (See Comments)   . Cyclobenzaprine Other (See Comments) 03/23/2015  . Loracarbef Other (See Comments) 07/05/2010  . Metronidazole  08/27/2009  . Moxifloxacin hcl in nacl  07/05/2010  . Other  10/14/2011  . Peanuts [peanut oil] Other (See Comments) 10/14/2011  . Penicillin g sodium Other (See Comments) 10/27/2011  . Sulfamethazine  07/05/2010  . Sulfamethoxazole Nausea Only 03/23/2015  . Sulfonamide derivatives    . Ciprofloxacin Anxiety and Other (See Comments) 03/23/2015  . Clindamycin Rash 07/31/2015    Past Medical History:  Diagnosis Date  . Arthritis    DDD  .  Asthma   . Colon cancer ([place]) 2008  . Diabetes mellitus   . Elevated liver enzymes   . GERD (gastroesophageal reflux disease)   . Thyroid tumor, benign     Past Surgical History:  Procedure Laterality Date  . BIOPSY THYROID     X2  . CESAREAN SECTION     x 1  . COLON SURGERY    . FOOT FRACTURE SURGERY Left 02/2015  . INCISIONAL HERNIA REPAIR    . NASAL SEPTUM SURGERY    . PARTIAL HYSTERECTOMY    . POLYPECTOMY    . UPPER GASTROINTESTINAL ENDOSCOPY    . WRIST GANGLION EXCISION      left    Family History  Problem Relation Age of Onset  . COPD Other        both sides  . Diabetes Other        both sides  . Heart disease Father   . Kidney failure Father   . Lung cancer Paternal Uncle   . Leukemia Paternal Grandfather   . Cervical cancer Paternal Aunt        x 2   . Heart defect Mother        MVP  . Breast cancer Mother   . Colon cancer Neg Hx   . Esophageal cancer Neg Hx     Social History   Socioeconomic History  . Marital status: Married    Spouse name: Not on file  . Number of children: 2  . Years of education: Not on file  . Highest education level: Not on file  Occupational History  . Occupation: claims for [employer]    Employer: [employer]  . Financial resource strain: Not on file  . Food insecurity    Worry: Not on file    Inability: Not on file  . Transportation needs    Medical: Not on file    Non-medical: Not on file  Tobacco Use  . Smoking status: Never Smoker  . Smokeless tobacco: Never Used  . Tobacco comment: never used product  Substance and Sexual Activity  . Alcohol use: No    Alcohol/week: 0.0 standard drinks  . Drug use: No  . Sexual activity: Not on file  Lifestyle  . Physical activity    Days per week: Not on file    Minutes per session: Not on file  . Stress: Not on file  Relationships  . Social Herbalist on phone: Not on file    Gets together: Not on file    Attends religious service: Not on file    Active member of club or organization: Not on file    Attends meetings of clubs or organizations: Not on file    Relationship status: Not on file  . Intimate partner violence    Fear of current or ex partner: Not on file    Emotionally abused: Not on file    Physically abused: Not on file    Forced sexual activity: Not on file  Other Topics Concern  . Not on file  Social History Narrative  . Not on file      Review of systems: Review of Systems  Constitutional: Negative for fever  and chills.  HENT: Negative.   Eyes: Negative for blurred vision.  Respiratory: Negative for cough, shortness of breath and wheezing.   Cardiovascular: Negative for chest pain and palpitations.  Gastrointestinal: as per HPI Genitourinary: Negative for dysuria, urgency, frequency and hematuria.  Musculoskeletal: Positive for myalgias, back pain and joint pain.  Skin: Negative for itching and rash.  Neurological: Negative for dizziness, tremors, focal weakness, seizures and loss of consciousness.  Endo/Heme/Allergies: Negative for seasonal allergies.  Psychiatric/Behavioral: Negative for depression, suicidal ideas and hallucinations.  All other systems reviewed and are negative.   Physical Exam: Vitals:   09/11/18 1520  BP: 132/78  Pulse: (!) 109  Temp: 98.5 F (36.9 C)   Body mass index is 27.21 kg/m. Gen:      No acute distress HEENT:  EOMI, sclera anicteric Neck:     No masses; no thyromegaly Lungs:    Clear to auscultation bilaterally; normal respiratory effort CV:         Regular rate and rhythm; no murmurs Abd:      + bowel sounds; soft, non-tender; no palpable masses, no distension, midline ventral hernia Ext:    No edema; adequate peripheral perfusion Skin:      Warm and dry; no rash Neuro: alert and oriented x 3 Psych: normal mood and affect  Data Reviewed:  Reviewed labs, radiology imaging, old records and pertinent past GI work up   Assessment and Plan/Recommendations:  63 year old female with history of proximal ascending colon cancer status post resection, diabetes, Karlene Lineman with abnormal LFT Will obtain liver elastography to exclude advanced fibrosis Follow-up LFT Discussed with patient to avoid NSAIDs or any hepatotoxins including alcohol. Avoid high carb and high fat diet, maintain good glycemic control with diet and exercise  EGD for further evaluation of upper abdominal discomfort, nausea and dyspepsia The risks and benefits as well as alternatives of  endoscopic procedure(s) have been discussed and reviewed. All questions answered. The patient agrees to proceed.  Continue AcipHex twice daily for GERD along with antireflux measures  Greater than 50% of the time used for counseling as well as treatment plan and follow-up. She had multiple questions which were answered to her satisfaction  K. Denzil Magnuson , MD    CC: No ref. provider found

## 2018-09-11 NOTE — Patient Instructions (Signed)
Go to the basement for labs today  Continue Aciphex 20 mg twice daily   ______________________________________________________________________     INSULIN (LONG ACTING) MEDICATION INSTRUCTIONS (Lantus, NPH, 70/30, Humulin, Novolin-N, Levemir, Toujeo )   The day before your procedure:  Take  your regular evening dose    The day of your procedure:  Do not take your morning dose                    You have been scheduled for an abdominal ultrasound at [facility] (1st floor of hospital) on 09/21/2018 at 9am. Please arrive 15 minutes prior to your appointment for registration. Make certain not to have anything to eat or drink 6 hours prior to your appointment. Should you need to reschedule your appointment, please contact radiology at [phone]. This test typically takes about 30 minutes to perform.  You have been scheduled for an endoscopy. Please follow written instructions given to you at your visit today. If you use inhalers (even only as needed), please bring them with you on the day of your procedure.   I appreciate the  opportunity to care for you  Thank You   Harl Bowie , MD

## 2018-09-13 ENCOUNTER — Other Ambulatory Visit: Payer: Self-pay

## 2018-09-13 DIAGNOSIS — K7581 Nonalcoholic steatohepatitis (NASH): Secondary | ICD-10-CM

## 2018-09-21 ENCOUNTER — Other Ambulatory Visit: Payer: Self-pay

## 2018-09-21 ENCOUNTER — Ambulatory Visit (HOSPITAL_COMMUNITY)
Admission: RE | Admit: 2018-09-21 | Discharge: 2018-09-21 | Disposition: A | Discharge status: Home / Self Care | Payer: PRIVATE HEALTH INSURANCE | Source: Ambulatory Visit | Attending: Gastroenterology | Admitting: Gastroenterology

## 2018-09-21 DIAGNOSIS — R1013 Epigastric pain: Secondary | ICD-10-CM | POA: Diagnosis not present

## 2018-09-25 ENCOUNTER — Telehealth: Payer: Self-pay | Admitting: Gastroenterology

## 2018-09-25 NOTE — Telephone Encounter (Signed)
Pt inquired about results of abdomen scan.

## 2018-09-26 NOTE — Telephone Encounter (Signed)
See the abdominal u/s report for details. She is presently scheduled for an EGD.

## 2018-10-04 ENCOUNTER — Encounter: Payer: PRIVATE HEALTH INSURANCE | Admitting: Gastroenterology

## 2018-12-03 ENCOUNTER — Other Ambulatory Visit: Payer: Self-pay

## 2018-12-03 DIAGNOSIS — R7989 Other specified abnormal findings of blood chemistry: Secondary | ICD-10-CM

## 2018-12-03 DIAGNOSIS — R945 Abnormal results of liver function studies: Secondary | ICD-10-CM

## 2019-01-04 ENCOUNTER — Telehealth: Payer: Self-pay | Admitting: Gastroenterology

## 2019-01-04 ENCOUNTER — Other Ambulatory Visit: Payer: Self-pay

## 2019-01-04 DIAGNOSIS — K7581 Nonalcoholic steatohepatitis (NASH): Secondary | ICD-10-CM

## 2019-01-04 NOTE — Telephone Encounter (Signed)
Spoke with the patient. She is coming in for follow up LFT next Tuesday. She needs a lipid panel done also. Asking if it can be added to the order and results forwarded to her PCP.

## 2019-01-04 NOTE — Telephone Encounter (Signed)
ok 

## 2019-01-04 NOTE — Telephone Encounter (Signed)
Lipid panel added. Patient advised to fast for a minimum of 8 hours before she has the lab drawn. Will forward the results to her PCP for follow up of the test.

## 2019-01-08 ENCOUNTER — Other Ambulatory Visit (INDEPENDENT_AMBULATORY_CARE_PROVIDER_SITE_OTHER): Payer: PRIVATE HEALTH INSURANCE

## 2019-01-08 DIAGNOSIS — R945 Abnormal results of liver function studies: Secondary | ICD-10-CM

## 2019-01-08 DIAGNOSIS — R7989 Other specified abnormal findings of blood chemistry: Secondary | ICD-10-CM

## 2019-01-08 LAB — HEPATIC FUNCTION PANEL
ALT: 61 U/L — ABNORMAL HIGH (ref 0–35)
AST: 66 U/L — ABNORMAL HIGH (ref 0–37)
Albumin: 4.1 g/dL (ref 3.5–5.2)
Alkaline Phosphatase: 89 U/L (ref 39–117)
Bilirubin, Direct: 0.1 mg/dL (ref 0.0–0.3)
Total Bilirubin: 0.4 mg/dL (ref 0.2–1.2)
Total Protein: 7.7 g/dL (ref 6.0–8.3)

## 2019-01-14 ENCOUNTER — Other Ambulatory Visit: Payer: Self-pay

## 2019-01-14 DIAGNOSIS — K7581 Nonalcoholic steatohepatitis (NASH): Secondary | ICD-10-CM

## 2019-02-18 ENCOUNTER — Telehealth: Payer: Self-pay | Admitting: Gastroenterology

## 2019-02-18 NOTE — Telephone Encounter (Signed)
Patient on Aciphex calling with complaints of 1 week of acid indigestion. She states "no matter what I eat, it gives me worse indigestion." She is taking TUMS for her breakthrough symptoms. She feels the acid all the time. She thinks she has "pulled that hernia." She is using the heating pad to the area. Bowel movements are at her baseline.

## 2019-02-19 ENCOUNTER — Other Ambulatory Visit: Payer: Self-pay

## 2019-02-19 MED ORDER — SUCRALFATE 1 G PO TABS: 1.0000 g | ORAL_TABLET | Freq: Two times a day (BID) | ORAL | 0 refills | Status: DC

## 2019-02-19 NOTE — Telephone Encounter (Signed)
Patient agrees with the plan. Rx to [facility]. Declines appointment with an APP. Office visit on 03/08/19 at [place].

## 2019-02-19 NOTE — Telephone Encounter (Signed)
Please send Rx for carafate 1gm twice daily X 30 days. Schedule office follow up visit with either me or extender next available soon. If continues to have persistent abdominal discomfort, will plan for EGD Thanks VN

## 2019-03-08 ENCOUNTER — Ambulatory Visit (INDEPENDENT_AMBULATORY_CARE_PROVIDER_SITE_OTHER): Payer: PRIVATE HEALTH INSURANCE | Admitting: Gastroenterology

## 2019-03-08 ENCOUNTER — Other Ambulatory Visit (INDEPENDENT_AMBULATORY_CARE_PROVIDER_SITE_OTHER): Payer: PRIVATE HEALTH INSURANCE

## 2019-03-08 ENCOUNTER — Encounter: Payer: Self-pay | Admitting: Gastroenterology

## 2019-03-08 VITALS — BP 134/70 | HR 100 | Temp 97.7°F | Ht 62.25 in | Wt 142.1 lb

## 2019-03-08 DIAGNOSIS — Z01818 Encounter for other preprocedural examination: Secondary | ICD-10-CM

## 2019-03-08 DIAGNOSIS — Z86010 Personal history of colonic polyps: Secondary | ICD-10-CM

## 2019-03-08 DIAGNOSIS — R1013 Epigastric pain: Secondary | ICD-10-CM | POA: Diagnosis not present

## 2019-03-08 DIAGNOSIS — Z85038 Personal history of other malignant neoplasm of large intestine: Secondary | ICD-10-CM

## 2019-03-08 DIAGNOSIS — K7581 Nonalcoholic steatohepatitis (NASH): Secondary | ICD-10-CM | POA: Diagnosis not present

## 2019-03-08 LAB — HEPATIC FUNCTION PANEL
ALT: 68 U/L — ABNORMAL HIGH (ref 0–35)
AST: 72 U/L — ABNORMAL HIGH (ref 0–37)
Albumin: 4.4 g/dL (ref 3.5–5.2)
Alkaline Phosphatase: 87 U/L (ref 39–117)
Bilirubin, Direct: 0 mg/dL (ref 0.0–0.3)
Total Bilirubin: 0.3 mg/dL (ref 0.2–1.2)
Total Protein: 7.8 g/dL (ref 6.0–8.3)

## 2019-03-08 LAB — LIPID PANEL
Cholesterol: 211 mg/dL — ABNORMAL HIGH (ref 0–200)
HDL: 33.6 mg/dL — ABNORMAL LOW (ref >39.00)
NonHDL: 177.6
Total CHOL/HDL Ratio: 6
Triglycerides: 299 mg/dL — ABNORMAL HIGH (ref 0.0–149.0)
VLDL: 59.8 mg/dL — ABNORMAL HIGH (ref 0.0–40.0)

## 2019-03-08 LAB — LDL CHOLESTEROL, DIRECT: Direct LDL: 142 mg/dL

## 2019-03-08 MED ORDER — ONDANSETRON 4 MG PO TBDP: 4.0000 mg | ORAL_TABLET | Freq: Three times a day (TID) | ORAL | 0 refills | Status: DC | PRN

## 2019-03-08 MED ORDER — POLYETHYLENE GLYCOL 3350 17 GM/SCOOP PO POWD: 1.0000 | Freq: Every day | ORAL | 0 refills | Status: DC

## 2019-03-08 NOTE — Progress Notes (Signed)
Stephanie Carroll    [id number]    [birth year]  Primary Care Physician:Pasquini, Kathryne Eriksson, RN  Referring Physician: No referring provider defined for this encounter.   Chief complaint: History of tubulovillous adenoma, fatty liver  HPI:  88 yr F with diabetes and fatty liver here for follow up visit  Continues to have epigastric abdominal pain, carafate helps some but hasnt resolved the epigastric pain.  Denies any nausea, vomiting,  melena or bright red blood per rectum  AST & ALT, most recent in 60's  Abdominal ultrasound with elastography September 21, 2018: Showed fibrosis score F2 with some F3, moderate risk of fibrosis.  Abdominal ultrasound April 22, 2018: Complex right lower pole renal cyst 1.4 cm.  Hepatic steatosis.  Colonoscopy April 06, 2016: Side-to-side ileocolonic anastomosis.  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  Biopsies positive for H. pylori gastritis with intestinal metaplasia, no evidence of dysplasia or malignancy.  GE junction biopsies negative for Barrett's esophagus.  Outpatient Encounter Medications as of 03/08/2019  Medication Sig  . albuterol (PROVENTIL HFA;VENTOLIN HFA) 108 (90 BASE) MCG/ACT inhaler Inhale 1 puff into the lungs every 6 (six) hours as needed. For shortness for breath  . BLACK ELDERBERRY PO Take 1 tablet by mouth daily.  . Cetirizine HCl 10 MG CAPS Take 10 mg by mouth as needed.  . cholecalciferol (VITAMIN D) 25 MCG (1000 UT) tablet Take 1,000 Units by mouth daily.  Marland Kitchen EPINEPHrine 0.3 mg/0.3 mL IJ SOAJ injection as needed.  Marland Kitchen guaiFENesin 200 MG tablet Take 400 mg by mouth 2 (two) times daily.  . Insulin Glargine (LANTUS  SOLOSTAR) 100 UNIT/ML Solostar Pen Inject 50 Units into the skin daily.  . metFORMIN (GLUCOPHAGE-XR) 500 MG 24 hr tablet Take 1,000 mg by mouth 2 (two) times daily.  . mometasone (ASMANEX) 220 MCG/INH inhaler Inhale 1 puff into the lungs 2 (two) times daily.    Marland Kitchen PROCTOSOL HC 2.5 % rectal cream PLACE 1 APPLICATION RECTALLY 2 (TWO) TIMES DAILY.  . RABEprazole (ACIPHEX) 20 MG tablet TAKE 1 TABLET BY MOUTH TWICE DAILY  . sucralfate (CARAFATE) 1 g tablet Take 1 tablet (1 g total) by mouth 2 (two) times daily.  Marland Kitchen UNABLE TO FIND Med Name: CBD oil PO   No facility-administered encounter medications on file as of 03/08/2019.    Allergies as of 03/08/2019 - Review Complete 09/11/2018  Allergen Reaction Noted  . Fluticasone-salmeterol Other (See Comments) and Palpitations 09/28/2007  . Morphine Other (See Comments) and Palpitations 09/28/2007  . Peanut-containing drug products Cough 07/15/2010  . Penicillins Rash 09/28/2007  . Sulfa antibiotics Other (See Comments)   . Cyclobenzaprine Other (See Comments) 03/23/2015  . Loracarbef Other (See Comments) 07/05/2010  . Metronidazole  08/27/2009  . Moxifloxacin hcl in nacl  07/05/2010  . Other  10/14/2011  . Peanuts [peanut oil] Other (See Comments) 10/14/2011  . Penicillin g sodium Other (See Comments) 10/27/2011  . Sulfamethazine  07/05/2010  . Sulfamethoxazole Nausea Only 03/23/2015  . Sulfonamide derivatives    . Ciprofloxacin Anxiety and Other (See Comments) 03/23/2015  . Clindamycin Rash 07/31/2015    Past Medical History:  Diagnosis Date  .  Arthritis    DDD  . Asthma   . Colon cancer ([place]) 2008  . Diabetes mellitus   . Elevated liver enzymes   . GERD (gastroesophageal reflux disease)   . Thyroid tumor, benign     Past Surgical History:  Procedure Laterality Date  . BIOPSY THYROID     X2  . CESAREAN SECTION     x 1  . COLON SURGERY    . FOOT FRACTURE SURGERY Left 02/2015  . INCISIONAL HERNIA REPAIR    . NASAL SEPTUM SURGERY     . PARTIAL HYSTERECTOMY    . POLYPECTOMY    . UPPER GASTROINTESTINAL ENDOSCOPY    . WRIST GANGLION EXCISION     left    Family History  Problem Relation Age of Onset  . COPD Other        both sides  . Diabetes Other        both sides  . Heart disease Father   . Kidney failure Father   . Lung cancer Paternal Uncle   . Leukemia Paternal Grandfather   . Cervical cancer Paternal Aunt        x 2   . Heart defect Mother        MVP  . Breast cancer Mother   . Colon cancer Neg Hx   . Esophageal cancer Neg Hx     Social History   Socioeconomic History  . Marital status: Married    Spouse name: Not on file  . Number of children: 2  . Years of education: Not on file  . Highest education level: Not on file  Occupational History  . 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 Smoking status: Never Smoker  . Smokeless tobacco: Never Used  . Tobacco comment: never used product  Substance and Sexual Activity  . Alcohol use: No    Alcohol/week: 0.0 standard drinks  . Drug use: No  . Sexual activity: Not on file  Other Topics Concern  . Not on file  Social History Narrative  . Not on file   Social Determinants of Health   Financial Resource Strain:   . Difficulty of Paying Living Expenses: Not on file  Food Insecurity:   . Worried About Charity fundraiser in the Last Year: Not on file  . Ran Out of Food in the Last Year: Not on file  Transportation Needs:   . Lack of Transportation (Medical): Not on file  . Lack of Transportation (Non-Medical): Not on file  Physical Activity:   . Days of Exercise per Week: Not on file  . Minutes of Exercise per Session: Not on file  Stress:   . Feeling of Stress : Not on file  Social Connections:   . Frequency of Communication with Friends and Family: Not on file  . Frequency of Social Gatherings with Friends and Family: Not on file  . Attends Religious Services: Not on file  . Active Member of Clubs or  Organizations: Not on file  . Attends Archivist Meetings: Not on file  . Marital Status: Not on file  Intimate Partner Violence:   . Fear of Current or Ex-Partner: Not on file  . Emotionally Abused: Not on file  . Physically Abused: Not on file  . Sexually Abused: Not on file      Review of systems: Review of Systems  Constitutional: Negative for fever and chills.  HENT: Negative.  Eyes: Negative for blurred vision.  Respiratory: Negative for cough, shortness of breath and wheezing.   Cardiovascular: Negative for chest pain and palpitations.  Gastrointestinal: as per HPI Genitourinary: Negative for dysuria, urgency, frequency and hematuria.  Musculoskeletal: Negative for myalgias, back pain and joint pain.  Skin: Negative for itching and rash.  Neurological: Negative for dizziness, tremors, focal weakness, seizures and loss of consciousness.  Endo/Heme/Allergies: Negative Psychiatric/Behavioral: Negative for depression, suicidal ideas and hallucinations.  All other systems reviewed and are negative.   Physical Exam: Vitals:   03/08/19 1452  BP: 134/70  Pulse: 100  Temp: 97.7 F (36.5 C)   Body mass index is 25.79 kg/m. Gen:      No acute distress HEENT:  EOMI, sclera anicteric Neck:     No masses; no thyromegaly Lungs:    Clear to auscultation bilaterally; normal respiratory effort CV:         Regular rate and rhythm; no murmurs Abd:      + bowel sounds; soft, non-tender; no palpable masses, no distension Ext:    No edema; adequate peripheral perfusion Skin:      Warm and dry; no rash Neuro: alert and oriented x 3 Psych: normal mood and affect  Data Reviewed:  Reviewed labs, radiology imaging, old records and pertinent past GI work up   Assessment and Plan/Recommendations:  71 yr F with proximal ascending colon ca s/p resection with tubullovillous and tubular adenomas on surveillance colonoscopy  She is due for recalll colonoscopy  Epigastric  abd pain: h/o H.pylori and gastric intestinal metaplasia Will schedule EGD along with colonoscopy The risks and benefits as well as alternatives of endoscopic procedure(s) have been discussed and reviewed. All questions answered. The patient agrees to proceed.   NASH: Follow up LFT Avoid ETOH and hepatotoxina Discussed diet and exercise  GERD: Continue Aciphex and antireflux measures   The patient was provided an opportunity to ask questions and all were answered. The patient agreed with the plan and demonstrated an understanding of the instructions.  Damaris Hippo , MD    CC: No ref. provider found

## 2019-03-08 NOTE — Patient Instructions (Signed)
You have been scheduled for an endoscopy and colonoscopy. Please follow the written instructions given to you at your visit today. Please pick up your prep supplies at the pharmacy within the next 1-3 days. If you use inhalers (even only as needed), please bring them with you on the day of your procedure.   Your provider has requested that you go to the basement level for lab work before leaving today. Press "B" on the elevator. The lab is located at the first door on the left as you exit the elevator.  We have sent Zofran to your pharmacy  Follow up in 6 months  If you are age 68 or older, your body mass index should be between 23-30. Your Body mass index is 25.79 kg/m. If this is out of the aforementioned range listed, please consider follow up with your Primary Care Provider.  If you are age 81 or younger, your body mass index should be between 19-25. Your Body mass index is 25.79 kg/m. If this is out of the aformentioned range listed, please consider follow up with your Primary Care Provider.    I appreciate the  opportunity to care for you  Thank You   Harl Bowie , MD

## 2019-03-14 ENCOUNTER — Other Ambulatory Visit: Payer: Self-pay | Admitting: Gastroenterology

## 2019-03-15 ENCOUNTER — Other Ambulatory Visit: Payer: Self-pay | Admitting: Gastroenterology

## 2019-03-15 ENCOUNTER — Ambulatory Visit (INDEPENDENT_AMBULATORY_CARE_PROVIDER_SITE_OTHER): Payer: Self-pay

## 2019-03-15 ENCOUNTER — Other Ambulatory Visit: Payer: Self-pay

## 2019-03-15 DIAGNOSIS — Z1159 Encounter for screening for other viral diseases: Secondary | ICD-10-CM

## 2019-03-18 LAB — SARS CORONAVIRUS 2 (TAT 6-24 HRS): SARS Coronavirus 2: NEGATIVE

## 2019-03-19 ENCOUNTER — Other Ambulatory Visit: Payer: Self-pay

## 2019-03-19 ENCOUNTER — Ambulatory Visit (AMBULATORY_SURGERY_CENTER): Payer: PRIVATE HEALTH INSURANCE | Admitting: Gastroenterology

## 2019-03-19 ENCOUNTER — Encounter: Payer: Self-pay | Admitting: Gastroenterology

## 2019-03-19 VITALS — BP 153/75 | HR 92 | Temp 96.6°F | Resp 20 | Ht 62.0 in | Wt 142.0 lb

## 2019-03-19 DIAGNOSIS — D125 Benign neoplasm of sigmoid colon: Secondary | ICD-10-CM

## 2019-03-19 DIAGNOSIS — Z85038 Personal history of other malignant neoplasm of large intestine: Secondary | ICD-10-CM | POA: Diagnosis present

## 2019-03-19 DIAGNOSIS — K2970 Gastritis, unspecified, without bleeding: Secondary | ICD-10-CM

## 2019-03-19 DIAGNOSIS — D123 Benign neoplasm of transverse colon: Secondary | ICD-10-CM

## 2019-03-19 DIAGNOSIS — R1013 Epigastric pain: Secondary | ICD-10-CM | POA: Diagnosis not present

## 2019-03-19 DIAGNOSIS — D124 Benign neoplasm of descending colon: Secondary | ICD-10-CM

## 2019-03-19 MED ORDER — SODIUM CHLORIDE 0.9 % IV SOLN: 500.0000 mL | Freq: Once | INTRAVENOUS | Status: DC

## 2019-03-19 NOTE — Op Note (Signed)
[place] Patient Name: Stephanie Carroll Procedure Date: 03/19/2019 11:32 AM MRN: [id number] Endoscopist: Mauri Pole , MD Age: 64 Referring MD:  Date of Birth: February 19, 1955 Gender: Female Account #: [id number] Procedure:                Colonoscopy Indications:              High risk colon cancer surveillance: Personal                            history of adenoma (10 mm or greater in size), High                            risk colon cancer surveillance: Personal history of                            multiple (3 or more) adenomas, High risk colon                            cancer surveillance: Personal history of colon                            cancer Medicines:                Monitored Anesthesia Care Procedure:                Pre-Anesthesia Assessment:                           - Prior to the procedure, a History and Physical                            was performed, and patient medications and                            allergies were reviewed. The patient's tolerance of                            previous anesthesia was also reviewed. The risks                            and benefits of the procedure and the sedation                            options and risks were discussed with the patient.                            All questions were answered, and informed consent                            was obtained. Prior Anticoagulants: The patient has                            taken no previous anticoagulant or antiplatelet  agents. ASA Grade Assessment: II - A patient with                            mild systemic disease. After reviewing the risks                            and benefits, the patient was deemed in                            satisfactory condition to undergo the procedure.                           After obtaining informed consent, the colonoscope                            was passed under direct vision. Throughout the                   procedure, the patient's blood pressure, pulse, and                            oxygen saturations were monitored continuously. The                            Colonoscope was introduced through the anus and                            advanced to the the ileocolonic anastomosis. The                            colonoscopy was performed without difficulty. The                            patient tolerated the procedure well. The quality                            of the bowel preparation was excellent. The                            ileocecal valve, appendiceal orifice, and rectum                            were photographed. Scope In: 11:50:05 AM Scope Out: 12:05:52 PM Scope Withdrawal Time: 0 hours 10 minutes 56 seconds  Total Procedure Duration: 0 hours 15 minutes 47 seconds  Findings:                 The perianal and digital rectal examinations were                            normal.                           Two sessile polyps were found in the descending  colon. The polyps were 1 to 2 mm in size. These                            polyps were removed with a cold biopsy forceps.                            Resection and retrieval were complete.                           Two sessile polyps were found in the sigmoid colon                            and transverse colon. The polyps were 3 to 11 mm in                            size. These polyps were removed with a cold snare.                            Resection and retrieval were complete.                           A few small-mouthed diverticula were found in the                            sigmoid colon and descending colon.                           Non-bleeding internal hemorrhoids were found during                            retroflexion. The hemorrhoids were small. Complications:            No immediate complications. Estimated Blood Loss:     Estimated blood loss was minimal. Impression:                - Two 1 to 2 mm polyps in the descending colon,                            removed with a cold biopsy forceps. Resected and                            retrieved.                           - Two 3 to 11 mm polyps in the sigmoid colon and in                            the transverse colon, removed with a cold snare.                            Resected and retrieved.                           - Diverticulosis in the  sigmoid colon and in the                            descending colon.                           - Non-bleeding internal hemorrhoids. Recommendation:           - Patient has a contact number available for                            emergencies. The signs and symptoms of potential                            delayed complications were discussed with the                            patient. Return to normal activities tomorrow.                            Written discharge instructions were provided to the                            patient.                           - Resume previous diet.                           - Continue present medications.                           - Await pathology results.                           - Repeat colonoscopy in 3 years for surveillance                            based on pathology results.                           - Return to GI clinic in 6 months. Mauri Pole, MD 03/19/2019 12:15:38 PM This report has been signed electronically.

## 2019-03-19 NOTE — Progress Notes (Signed)
Report given to PACU, vss 

## 2019-03-19 NOTE — Patient Instructions (Signed)
Discharge instructions given. Handouts on Gastritis,Hiatal Hernia,polyps,diverticulosis and hemorrhoids. Resume previous medications. YOU HAD AN ENDOSCOPIC PROCEDURE TODAY AT [facility]:   Refer to the procedure report that was given to you for any specific questions about what was found during the examination.  If the procedure report does not answer your questions, please call your gastroenterologist to clarify.  If you requested that your care partner not be given the details of your procedure findings, then the procedure report has been included in a sealed envelope for you to review at your convenience later.  YOU SHOULD EXPECT: Some feelings of bloating in the abdomen. Passage of more gas than usual.  Walking can help get rid of the air that was put into your GI tract during the procedure and reduce the bloating. If you had a lower endoscopy (such as a colonoscopy or flexible sigmoidoscopy) you may notice spotting of blood in your stool or on the toilet paper. If you underwent a bowel prep for your procedure, you may not have a normal bowel movement for a few days.  Please Note:  You might notice some irritation and congestion in your nose or some drainage.  This is from the oxygen used during your procedure.  There is no need for concern and it should clear up in a day or so.  SYMPTOMS TO REPORT IMMEDIATELY:   Following lower endoscopy (colonoscopy or flexible sigmoidoscopy):  Excessive amounts of blood in the stool  Significant tenderness or worsening of abdominal pains  Swelling of the abdomen that is new, acute  Fever of 100F or higher   Following upper endoscopy (EGD)  Vomiting of blood or coffee ground material  New chest pain or pain under the shoulder blades  Painful or persistently difficult swallowing  New shortness of breath  Fever of 100F or higher  Black, tarry-looking stools  For urgent or emergent issues, a gastroenterologist can be reached at any  hour by calling [phone].   DIET:  We do recommend a small meal at first, but then you may proceed to your regular diet.  Drink plenty of fluids but you should avoid alcoholic beverages for 24 hours.  ACTIVITY:  You should plan to take it easy for the rest of today and you should NOT DRIVE or use heavy machinery until tomorrow (because of the sedation medicines used during the test).    FOLLOW UP: Our staff will call the number listed on your records 48-72 hours following your procedure to check on you and address any questions or concerns that you may have regarding the information given to you following your procedure. If we do not reach you, we will leave a message.  We will attempt to reach you two times.  During this call, we will ask if you have developed any symptoms of COVID 19. If you develop any symptoms (ie: fever, flu-like symptoms, shortness of breath, cough etc.) before then, please call [phone].  If you test positive for Covid 19 in the 2 weeks post procedure, please call and report this information to [place].    If any biopsies were taken you will be contacted by phone or by letter within the next 1-3 weeks.  Please call us at [phone] if you have not heard about the biopsies in 3 weeks.    SIGNATURES/CONFIDENTIALITY: You and/or your care partner have signed paperwork which will be entered into your electronic medical record.  These signatures attest to the fact that that the  information above on your After Visit Summary has been reviewed and is understood.  Full responsibility of the confidentiality of this discharge information lies with you and/or your care-partner.

## 2019-03-19 NOTE — Progress Notes (Signed)
Called to room to assist during endoscopic procedure.  Patient ID and intended procedure confirmed with present staff. Received instructions for my participation in the procedure from the performing physician.  

## 2019-03-19 NOTE — Op Note (Signed)
[place] Patient Name: Stephanie Carroll Procedure Date: 03/19/2019 11:32 AM MRN: [id number] Endoscopist: Mauri Pole , MD Age: 64 Referring MD:  Date of Birth: 1955-08-07 Gender: Female Account #: [id number] Procedure:                Upper GI endoscopy Indications:              Epigastric abdominal pain Medicines:                Monitored Anesthesia Care Procedure:                Pre-Anesthesia Assessment:                           - Prior to the procedure, a History and Physical                            was performed, and patient medications and                            allergies were reviewed. The patient's tolerance of                            previous anesthesia was also reviewed. The risks                            and benefits of the procedure and the sedation                            options and risks were discussed with the patient.                            All questions were answered, and informed consent                            was obtained. Prior Anticoagulants: The patient has                            taken no previous anticoagulant or antiplatelet                            agents. ASA Grade Assessment: II - A patient with                            mild systemic disease. After reviewing the risks                            and benefits, the patient was deemed in                            satisfactory condition to undergo the procedure.                           After obtaining informed consent, the endoscope was  passed under direct vision. Throughout the                            procedure, the patient's blood pressure, pulse, and                            oxygen saturations were monitored continuously. The                            Endoscope was introduced through the mouth, and                            advanced to the second part of duodenum. The upper                            GI endoscopy was  accomplished without difficulty.                            The patient tolerated the procedure well. Scope In: Scope Out: Findings:                 The Z-line was regular and was found 35 cm from the                            incisors.                           The esophagus was normal. Small hiatal hernia                           Patchy mild inflammation characterized by                            congestion (edema) and erythema was found in the                            entire examined stomach. Biopsies were taken with a                            cold forceps for Helicobacter pylori testing.                           The cardia and gastric fundus were normal on                            retroflexion.                           The examined duodenum was normal. Complications:            No immediate complications. Estimated Blood Loss:     Estimated blood loss was minimal. Impression:               - Z-line regular, 35 cm from the incisors.                           -  Normal esophagus.                           - Gastritis. Biopsied.                           - Normal examined duodenum. Recommendation:           - Patient has a contact number available for                            emergencies. The signs and symptoms of potential                            delayed complications were discussed with the                            patient. Return to normal activities tomorrow.                            Written discharge instructions were provided to the                            patient.                           - Resume previous diet.                           - Continue present medications.                           - Await pathology results. Mauri Pole, MD 03/19/2019 12:09:46 PM This report has been signed electronically.

## 2019-03-19 NOTE — Progress Notes (Signed)
Temperature taken by J.B., VS taken by N.C.

## 2019-03-21 ENCOUNTER — Telehealth: Payer: Self-pay | Admitting: Gastroenterology

## 2019-03-21 ENCOUNTER — Telehealth: Payer: Self-pay

## 2019-03-21 NOTE — Telephone Encounter (Signed)
Patient is calling with post op questions and concerns states she has an allergic reaction around her mouth and also has some abdominal pain.

## 2019-03-21 NOTE — Telephone Encounter (Signed)
Ok, thank you

## 2019-03-21 NOTE — Telephone Encounter (Signed)
  Follow up Call-  Call back number 03/19/2019  Post procedure Call Back phone  # [phone]  Permission to leave phone message Yes  Some recent data might be hidden     Patient questions:  Do you have a fever, pain , or abdominal swelling? No. Pain Score  0 *  Have you tolerated food without any problems? Yes.    Have you been able to return to your normal activities? Yes.    Do you have any questions about your discharge instructions: Diet   No. Medications  No. Follow up visit  No.  Do you have questions or concerns about your Care? Yes.    Actions: * If pain score is 4 or above: No action needed, pain <4.  1. Have you developed a fever since your procedure? no  2.   Have you had an respiratory symptoms (SOB or cough) since your procedure? no  3.   Have you tested positive for COVID 19 since your procedure no  4.   Have you had any family members/close contacts diagnosed with the COVID 19 since your procedure?  no   If yes to any of these questions please route to Joylene John, RN and Alphonsa Gin, Therapist, sports.

## 2019-03-22 ENCOUNTER — Encounter: Payer: Self-pay | Admitting: Gastroenterology

## 2019-03-22 ENCOUNTER — Other Ambulatory Visit: Payer: Self-pay

## 2019-03-25 ENCOUNTER — Other Ambulatory Visit: Payer: Self-pay

## 2019-03-25 DIAGNOSIS — A048 Other specified bacterial intestinal infections: Secondary | ICD-10-CM

## 2019-03-27 ENCOUNTER — Telehealth: Payer: Self-pay | Admitting: Gastroenterology

## 2019-03-27 NOTE — Telephone Encounter (Signed)
Patient and ID notified of this plan.  Reminder to myself to reach out to the patient in 3 month before re-sending the referral.

## 2019-03-27 NOTE — Telephone Encounter (Signed)
Ok, please make a reminder to follow up in 3 months. Thanks

## 2019-03-27 NOTE — Telephone Encounter (Signed)
She is wanting to postpone the referral to ID. Her mother in law is now in Hospice. She is afraid of not being able to meet the commitment of an appointment or something happening when she is away. She asks if this is okay? She also asked if you would treat her with something. I did review the reason for the referral and her multiple drug allergies. She states understanding of this.

## 2019-04-25 ENCOUNTER — Other Ambulatory Visit: Payer: Self-pay | Admitting: *Deleted

## 2019-04-25 DIAGNOSIS — C182 Malignant neoplasm of ascending colon: Secondary | ICD-10-CM

## 2019-04-26 ENCOUNTER — Inpatient Hospital Stay: Payer: PRIVATE HEALTH INSURANCE

## 2019-04-26 ENCOUNTER — Inpatient Hospital Stay: Payer: PRIVATE HEALTH INSURANCE | Admitting: Hematology & Oncology

## 2019-07-09 ENCOUNTER — Encounter: Payer: Self-pay | Admitting: Gastroenterology

## 2019-07-17 ENCOUNTER — Inpatient Hospital Stay: Payer: PRIVATE HEALTH INSURANCE | Attending: Hematology & Oncology

## 2019-07-17 ENCOUNTER — Other Ambulatory Visit: Payer: Self-pay

## 2019-07-17 ENCOUNTER — Inpatient Hospital Stay (HOSPITAL_BASED_OUTPATIENT_CLINIC_OR_DEPARTMENT_OTHER): Payer: PRIVATE HEALTH INSURANCE | Admitting: Hematology & Oncology

## 2019-07-17 ENCOUNTER — Telehealth: Payer: Self-pay | Admitting: Hematology & Oncology

## 2019-07-17 VITALS — BP 143/67 | HR 90 | Temp 97.2°F | Resp 18 | Wt 141.0 lb

## 2019-07-17 DIAGNOSIS — C187 Malignant neoplasm of sigmoid colon: Secondary | ICD-10-CM | POA: Insufficient documentation

## 2019-07-17 DIAGNOSIS — Z794 Long term (current) use of insulin: Secondary | ICD-10-CM | POA: Diagnosis not present

## 2019-07-17 DIAGNOSIS — R928 Other abnormal and inconclusive findings on diagnostic imaging of breast: Secondary | ICD-10-CM | POA: Diagnosis not present

## 2019-07-17 DIAGNOSIS — H538 Other visual disturbances: Secondary | ICD-10-CM | POA: Diagnosis not present

## 2019-07-17 DIAGNOSIS — C182 Malignant neoplasm of ascending colon: Secondary | ICD-10-CM

## 2019-07-17 DIAGNOSIS — Z79899 Other long term (current) drug therapy: Secondary | ICD-10-CM | POA: Insufficient documentation

## 2019-07-17 DIAGNOSIS — R7989 Other specified abnormal findings of blood chemistry: Secondary | ICD-10-CM | POA: Insufficient documentation

## 2019-07-17 DIAGNOSIS — E119 Type 2 diabetes mellitus without complications: Secondary | ICD-10-CM | POA: Diagnosis not present

## 2019-07-17 LAB — CBC WITH DIFFERENTIAL (CANCER CENTER ONLY)
Abs Immature Granulocytes: 0.05 10*3/uL (ref 0.00–0.07)
Basophils Absolute: 0.1 10*3/uL (ref 0.0–0.1)
Basophils Relative: 1 %
Eosinophils Absolute: 0.2 10*3/uL (ref 0.0–0.5)
Eosinophils Relative: 3 %
HCT: 39.2 % (ref 36.0–46.0)
Hemoglobin: 12.3 g/dL (ref 12.0–15.0)
Immature Granulocytes: 1 %
Lymphocytes Relative: 30 %
Lymphs Abs: 2.8 10*3/uL (ref 0.7–4.0)
MCH: 27 pg (ref 26.0–34.0)
MCHC: 31.4 g/dL (ref 30.0–36.0)
MCV: 86.2 fL (ref 80.0–100.0)
Monocytes Absolute: 0.6 10*3/uL (ref 0.1–1.0)
Monocytes Relative: 6 %
Neutro Abs: 5.6 10*3/uL (ref 1.7–7.7)
Neutrophils Relative %: 59 %
Platelet Count: 259 10*3/uL (ref 150–400)
RBC: 4.55 MIL/uL (ref 3.87–5.11)
RDW: 14.3 % (ref 11.5–15.5)
WBC Count: 9.3 10*3/uL (ref 4.0–10.5)
nRBC: 0 % (ref 0.0–0.2)

## 2019-07-17 LAB — CMP (CANCER CENTER ONLY)
ALT: 55 U/L — ABNORMAL HIGH (ref 0–44)
AST: 51 U/L — ABNORMAL HIGH (ref 15–41)
Albumin: 4.4 g/dL (ref 3.5–5.0)
Alkaline Phosphatase: 91 U/L (ref 38–126)
Anion gap: 11 (ref 5–15)
BUN: 11 mg/dL (ref 8–23)
CO2: 26 mmol/L (ref 22–32)
Calcium: 9.9 mg/dL (ref 8.9–10.3)
Chloride: 99 mmol/L (ref 98–111)
Creatinine: 0.68 mg/dL (ref 0.44–1.00)
GFR, Est AFR Am: >60 mL/min (ref >60)
GFR, Estimated: >60 mL/min (ref >60)
Glucose, Bld: 247 mg/dL — ABNORMAL HIGH (ref 70–99)
Potassium: 4.5 mmol/L (ref 3.5–5.1)
Sodium: 136 mmol/L (ref 135–145)
Total Bilirubin: 0.3 mg/dL (ref 0.3–1.2)
Total Protein: 7.8 g/dL (ref 6.5–8.1)

## 2019-07-17 NOTE — Progress Notes (Signed)
Hematology and Oncology Follow Up Visit  Stephanie Carroll [id number] 1956/01/25 64 y.o. 07/17/2019   Principle Diagnosis:  Stage II carcinoma of colon  Current Therapy:   Observation   Interim History:  Ms. Stephanie Carroll is here today for follow-up.  We see her yearly.  Unfortunately, she is under a lot of stress right now.  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  She cannot take any time off to go to see a doctor.  As such, she is on a vacation in our making all of her doctors appointments.  Clearly, her problem will continue to be her diabetes.  Her blood sugar is 247.  Her endocrinologist, who she saw today, increase her Lantus insulin to 54 units daily.  She fell and broke a rib on her right side about a month ago.  This is getting better.  She has had a mammogram a couple months ago.  She gets mammograms every 6 months because of a abnormality with the right breast.  She has had no issues with nausea or vomiting.  There is been no leg swelling.  She has had no fever.  She has not had the coronavirus vaccine yet.  She is is a little worried of taking this.  Overall, her performance status is ECOG 1.    Medications:  Allergies as of 07/17/2019      Reactions   Fluticasone-salmeterol Other (See Comments), Palpitations   Morphine Other (See Comments), Palpitations   REACTION: heart racing   Peanut-containing Drug Products Cough   Penicillins Rash   REACTION: rash   Sulfa Antibiotics Other (See Comments)   Cyclobenzaprine Other (See Comments)   Loracarbef Other (See Comments)   Derm.   Metronidazole    Moxifloxacin Hcl In Nacl    Other    Oats and Green peppers---headaches   Peanuts [peanut Oil] Other (See Comments)   congested   Penicillin G Sodium Other (See Comments)   Derm.   Sulfamethazine    Sulfamethoxazole Nausea Only   Sulfonamide Derivatives    Ciprofloxacin Anxiety, Other (See Comments)   Clindamycin Rash      Medication  List       Accurate as of July 17, 2019  1:05 PM. If you have any questions, ask your nurse or doctor.        albuterol 108 (90 Base) MCG/ACT inhaler Commonly known as: VENTOLIN HFA Inhale 1 puff into the lungs every 6 (six) hours as needed. For shortness for breath   BLACK ELDERBERRY PO Take 1 tablet by mouth daily.   Cetirizine HCl 10 MG Caps Take 10 mg by mouth as needed.   cholecalciferol 25 MCG (1000 UNIT) tablet Commonly known as: VITAMIN D Take 1,000 Units by mouth daily.   EPINEPHrine 0.3 mg/0.3 mL Soaj injection Commonly known as: EPI-PEN as needed.   guaiFENesin 200 MG tablet Take 400 mg by mouth 2 (two) times daily.   insulin glargine 100 UNIT/ML injection Commonly known as: LANTUS Inject 54 Units into the skin daily. What changed: Another medication with the same name was removed. Continue taking this medication, and follow the directions you see here. Changed by: Volanda Napoleon, MD   LORazepam 0.5 MG tablet Commonly known as: ATIVAN Take 0.5 mg by mouth 2 (two) times daily as needed.   metFORMIN 500 MG 24 hr tablet Commonly known as: GLUCOPHAGE-XR Take 1,000 mg by mouth 2 (two) times daily.   mometasone 220 MCG/INH inhaler  Commonly known as: ASMANEX Inhale 1 puff into the lungs 2 (two) times daily.   Asmanex HFA 200 MCG/ACT Aero Generic drug: Mometasone Furoate SMARTSIG:2 Puff(s) By Mouth Twice Daily   ondansetron 4 MG disintegrating tablet Commonly known as: Zofran ODT Take 1 tablet (4 mg total) by mouth every 8 (eight) hours as needed for nausea or vomiting.   Proctosol HC 2.5 % rectal cream Generic drug: hydrocortisone PLACE 1 APPLICATION RECTALLY 2 (TWO) TIMES DAILY. What changed:   when to take this  reasons to take this   RABEprazole 20 MG tablet Commonly known as: ACIPHEX TAKE 1 TABLET BY MOUTH TWICE DAILY   sucralfate 1 g tablet Commonly known as: CARAFATE TAKE 1 TABLET (1 G TOTAL) BY MOUTH 2 (TWO) TIMES DAILY.   UNABLE TO  FIND Med Name: CBD oil PO       Allergies:  Allergies  Allergen Reactions  . Fluticasone-Salmeterol Other (See Comments) and Palpitations  . Morphine Other (See Comments) and Palpitations    REACTION: heart racing  . Peanut-Containing Drug Products Cough  . Penicillins Rash    REACTION: rash  . Sulfa Antibiotics Other (See Comments)  . Cyclobenzaprine Other (See Comments)  . Loracarbef Other (See Comments)    Derm.   . Metronidazole   . Moxifloxacin Hcl In Nacl   . Other     Oats and Green peppers---headaches  . Peanuts [Peanut Oil] Other (See Comments)    congested  . Penicillin G Sodium Other (See Comments)    Derm.  . Sulfamethazine   . Sulfamethoxazole Nausea Only  . Sulfonamide Derivatives   . Ciprofloxacin Anxiety and Other (See Comments)  . Clindamycin Rash    Past Medical History, Surgical history, Social history, and Family History were reviewed and updated.  Review of Systems: Review of Systems  Constitutional: Negative.   HENT: Negative.   Eyes: Positive for blurred vision.  Respiratory: Negative.   Cardiovascular: Negative.   Gastrointestinal: Negative.   Genitourinary: Negative.   Musculoskeletal: Negative.   Skin: Negative.   Neurological: Negative.   Endo/Heme/Allergies: Negative.   Psychiatric/Behavioral: Negative.       Physical Exam:  weight is 141 lb (64 kg). Her temporal temperature is 97.2 F (36.2 C) (abnormal). Her blood pressure is 143/67 (abnormal) and her pulse is 90. Her respiration is 18 and oxygen saturation is 99%.   Wt Readings from Last 3 Encounters:  07/17/19 141 lb (64 kg)  03/19/19 142 lb (64.4 kg)  03/08/19 142 lb 2 oz (64.5 kg)    Physical Exam Vitals reviewed.  HENT:     Head: Normocephalic and atraumatic.  Eyes:     Pupils: Pupils are equal, round, and reactive to light.  Cardiovascular:     Rate and Rhythm: Normal rate and regular rhythm.     Heart sounds: Normal heart sounds.  Pulmonary:     Effort:  Pulmonary effort is normal.     Breath sounds: Normal breath sounds.  Abdominal:     General: Bowel sounds are normal.     Palpations: Abdomen is soft.  Musculoskeletal:        General: No tenderness or deformity. Normal range of motion.     Cervical back: Normal range of motion.  Lymphadenopathy:     Cervical: No cervical adenopathy.  Skin:    General: Skin is warm and dry.     Findings: No erythema or rash.  Neurological:     Mental Status: She is alert and oriented to  person, place, and time.  Psychiatric:        Behavior: Behavior normal.        Thought Content: Thought content normal.        Judgment: Judgment normal.      Lab Results  Component Value Date   WBC 9.3 07/17/2019   HGB 12.3 07/17/2019   HCT 39.2 07/17/2019   MCV 86.2 07/17/2019   PLT 259 07/17/2019   Lab Results  Component Value Date   FERRITIN 26.3 06/19/2018   IRON 58 08/08/2008   IRONPCTSAT 13.3 (L) 08/08/2008   Lab Results  Component Value Date   RETICCTPCT 2.0 03/01/2007   RBC 4.55 07/17/2019   RETICCTABS 101.4 03/01/2007   No results found for: Nils Pyle, [facility] Lab Results  Component Value Date   IGA 371 06/19/2018   No results found for: Odetta Pink, SPEI   Chemistry      Component Value Date/Time   NA 136 07/17/2019 1153   NA 126 (L) 04/22/2016 1456   NA 141 11/29/2013 1454   K 4.5 07/17/2019 1153   K 4.1 04/22/2016 1456   K 3.8 11/29/2013 1454   CL 99 07/17/2019 1153   CL 95 (L) 04/22/2016 1456   CL 98 11/29/2013 1454   CO2 26 07/17/2019 1153   CO2 21 04/22/2016 1456   CO2 25 11/29/2013 1454   BUN 11 07/17/2019 1153   BUN 6 04/22/2016 1456   BUN 12 11/29/2013 1454   CREATININE 0.68 07/17/2019 1153   CREATININE 0.55 (L) 04/22/2016 1456   CREATININE 0.8 11/29/2013 1454      Component Value Date/Time   CALCIUM 9.9 07/17/2019 1153   CALCIUM 9.5 04/22/2016 1456   CALCIUM 9.2 11/29/2013 1454    ALKPHOS 91 07/17/2019 1153   ALKPHOS 103 04/22/2016 1456   ALKPHOS 93 (H) 11/29/2013 1454   AST 51 (H) 07/17/2019 1153   ALT 55 (H) 07/17/2019 1153   ALT 36 11/29/2013 1454   BILITOT 0.3 07/17/2019 1153      Impression and Plan: Ms. Naeve is a very pleasant 64 yo caucasian female with history of 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 She did not require any adjuvant therapy.   Again, her LFTs are elevated, but they are improved..  She is diabetic.  We did do an ultrasound of her liver last year.  The ultrasound does show some hepatic steatosis, which is no surprise.    I will still plan to get her back in 1 year.   Volanda Napoleon, MD 6/2/20211:05 PM

## 2019-07-17 NOTE — Telephone Encounter (Signed)
Called and LMVM for patient with updated appointments per 6/2 los

## 2019-07-18 LAB — CEA (IN HOUSE-CHCC): CEA (CHCC-In House): 1.71 ng/mL (ref 0.00–5.00)

## 2019-09-13 ENCOUNTER — Ambulatory Visit (INDEPENDENT_AMBULATORY_CARE_PROVIDER_SITE_OTHER): Payer: PRIVATE HEALTH INSURANCE | Admitting: Gastroenterology

## 2019-09-13 ENCOUNTER — Encounter: Payer: Self-pay | Admitting: Gastroenterology

## 2019-09-13 VITALS — BP 140/70 | HR 110 | Ht 61.0 in | Wt 141.0 lb

## 2019-09-13 DIAGNOSIS — Z85038 Personal history of other malignant neoplasm of large intestine: Secondary | ICD-10-CM | POA: Diagnosis not present

## 2019-09-13 DIAGNOSIS — R110 Nausea: Secondary | ICD-10-CM | POA: Diagnosis not present

## 2019-09-13 DIAGNOSIS — K219 Gastro-esophageal reflux disease without esophagitis: Secondary | ICD-10-CM

## 2019-09-13 DIAGNOSIS — K2970 Gastritis, unspecified, without bleeding: Secondary | ICD-10-CM | POA: Diagnosis not present

## 2019-09-13 DIAGNOSIS — B9681 Helicobacter pylori [H. pylori] as the cause of diseases classified elsewhere: Secondary | ICD-10-CM

## 2019-09-13 MED ORDER — RABEPRAZOLE SODIUM 20 MG PO TBEC: 20.0000 mg | DELAYED_RELEASE_TABLET | Freq: Two times a day (BID) | ORAL | 3 refills | Status: DC

## 2019-09-13 MED ORDER — ONDANSETRON 4 MG PO TBDP: 4.0000 mg | ORAL_TABLET | Freq: Three times a day (TID) | ORAL | 3 refills | Status: DC | PRN

## 2019-09-13 NOTE — Patient Instructions (Signed)
We will refill your Zofran and Aciphex today  Follow up in 6 months  If you are age 62 or older, your body mass index should be between 23-30. Your Body mass index is 26.64 kg/m. If this is out of the aforementioned range listed, please consider follow up with your Primary Care Provider.  If you are age 69 or younger, your body mass index should be between 19-25. Your Body mass index is 26.64 kg/m. If this is out of the aformentioned range listed, please consider follow up with your Primary Care Provider.    I appreciate the  opportunity to care for you  Thank You   Harl Bowie , MD

## 2019-09-13 NOTE — Progress Notes (Signed)
Stephanie Carroll    [id number]    June 09, 1955  Primary Care Physician:Lischke, Eliott Nine., MD  Referring Physician: Clois Dupes., MD [place],  [place] [address]   Chief complaint:  H/o colon cancer, colon polyps and fatty liver  HPI: 64 year old very pleasant female here for follow-up visit for fatty liver and history of colon polyps, colon cancer She continues to have intermittent heartburn and nausea, feels it is related to extreme work-related stress.  She is planning to retire end of this year  Denies any vomiting, dysphagia, melena or blood per rectum  She has 3-5 semiformed bowel movements daily, at baseline s/p surgery and varies with diet.  Colonoscopy: 03/19/19: Sigmoid diverticulosis. Internal hemorrhoids. 2 sessile polyps 3 to 11 mm in size, adenoma.  EGD 03/19/19: Gastritis, H. pylori positive  Abdominal ultrasound with elastography September 21, 2018: Showed fibrosis score F2 with some F3, moderate risk of fibrosis.  Abdominal ultrasound April 22, 2018: Complex right lower pole renal cyst 1.4 cm.  Hepatic steatosis.  Colonoscopy April 06, 2016: Side-to-side ileocolonic anastomosis.  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  Biopsies positive for H. pylori gastritis with intestinal metaplasia, no evidence of dysplasia or malignancy.  GE junction biopsies negative for Barrett's esophagus.  Outpatient Encounter Medications as of 09/13/2019  Medication Sig  . albuterol (PROVENTIL HFA;VENTOLIN HFA) 108 (90 BASE) MCG/ACT inhaler Inhale 1 puff into the lungs every 6 (six) hours as needed. For shortness for breath  . ASMANEX HFA 200  MCG/ACT AERO SMARTSIG:2 Puff(s) By Mouth Twice Daily  . BLACK ELDERBERRY PO Take 1 tablet by mouth daily.  . Cetirizine HCl 10 MG CAPS Take 10 mg by mouth as needed.  . cholecalciferol (VITAMIN D) 25 MCG (1000 UT) tablet Take 1,000 Units by mouth daily.  Marland Kitchen EPINEPHrine 0.3 mg/0.3 mL IJ SOAJ injection as needed.  Marland Kitchen guaiFENesin 200 MG tablet Take 400 mg by mouth 2 (two) times daily.  . insulin glargine (LANTUS) 100 UNIT/ML injection Inject 54 Units into the skin daily.  Marland Kitchen LORazepam (ATIVAN) 0.5 MG tablet Take 0.5 mg by mouth 2 (two) times daily as needed.  . metFORMIN (GLUCOPHAGE-XR) 500 MG 24 hr tablet Take 1,000 mg by mouth 2 (two) times daily.  . mometasone (ASMANEX) 220 MCG/INH inhaler Inhale 1 puff into the lungs 2 (two) times daily.    . ondansetron (ZOFRAN ODT) 4 MG disintegrating tablet Take 1 tablet (4 mg total) by mouth every 8 (eight) hours as needed for nausea or vomiting.  Marland Kitchen PROCTOSOL HC 2.5 % rectal cream PLACE 1 APPLICATION RECTALLY 2 (TWO) TIMES DAILY. (Patient taking differently: Place 1 application rectally as needed. )  . RABEprazole (ACIPHEX) 20 MG tablet TAKE 1 TABLET BY MOUTH TWICE DAILY  . sucralfate (CARAFATE) 1 g tablet TAKE 1 TABLET (1 G TOTAL) BY MOUTH 2 (TWO) TIMES DAILY.  Marland Kitchen UNABLE TO FIND Med Name: CBD oil PO   No facility-administered encounter medications on file as of 09/13/2019.    Allergies as of 09/13/2019 - Review Complete 09/13/2019  Allergen Reaction Noted  . Fluticasone-salmeterol Other (See Comments) and Palpitations 09/28/2007  . Morphine  Other (See Comments) and Palpitations 09/28/2007  . Peanut-containing drug products Cough 07/15/2010  . Penicillins Rash 09/28/2007  . Sulfa antibiotics Other (See Comments)   . Cyclobenzaprine Other (See Comments) 03/23/2015  . Loracarbef Other (See Comments) 07/05/2010  . Metronidazole  08/27/2009  . Moxifloxacin hcl in nacl  07/05/2010  . Other  10/14/2011  . Peanuts [peanut oil] Other (See Comments) 10/14/2011   . Penicillin g sodium Other (See Comments) 10/27/2011  . Sulfamethazine  07/05/2010  . Sulfamethoxazole Nausea Only 03/23/2015  . Sulfonamide derivatives    . Ciprofloxacin Anxiety and Other (See Comments) 03/23/2015  . Clindamycin Rash 07/31/2015    Past Medical History:  Diagnosis Date  . Allergy   . Arthritis    DDD  . Asthma   . Colon cancer ([place]) 2008  . Diabetes mellitus   . Elevated liver enzymes   . GERD (gastroesophageal reflux disease)   . Hyperlipidemia   . Thyroid tumor, benign     Past Surgical History:  Procedure Laterality Date  . BIOPSY THYROID     X2  . CESAREAN SECTION     x 1  . COLON SURGERY    . FOOT FRACTURE SURGERY Left 02/2015  . INCISIONAL HERNIA REPAIR    . NASAL SEPTUM SURGERY    . PARTIAL HYSTERECTOMY    . POLYPECTOMY    . UPPER GASTROINTESTINAL ENDOSCOPY    . WRIST GANGLION EXCISION     left    Family History  Problem Relation Age of Onset  . COPD Other        both sides  . Diabetes Other        both sides  . Heart disease Father   . Kidney failure Father   . Lung cancer Paternal Uncle   . Leukemia Paternal Grandfather   . Cervical cancer Paternal Aunt        x 2   . Heart defect Mother        MVP  . Breast cancer Mother   . Colon cancer Neg Hx   . Esophageal cancer Neg Hx   . Rectal cancer Neg Hx   . Stomach cancer Neg Hx     Social History   Socioeconomic History  . Marital status: Married    Spouse name: Not on file  . Number of children: 2  . Years of education: Not on file  . Highest education level: Not on file  Occupational History  . 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 Smoking status: Never Smoker  . Smokeless tobacco: Never Used  . Tobacco comment: never used product  Vaping Use  . Vaping Use: Never used  Substance and Sexual Activity  . Alcohol use: No    Alcohol/week: 0.0 standard drinks  . Drug use: No  . Sexual activity: Not on file  Other Topics Concern  .  Not on file  Social History Narrative  . Not on file   Social Determinants of Health   Financial Resource Strain:   . Difficulty of Paying Living Expenses:   Food Insecurity:   . Worried About Charity fundraiser in the Last Year:   . Arboriculturist in the Last Year:   Transportation Needs:   . Film/video editor (Medical):   Marland Kitchen Lack of Transportation (Non-Medical):   Physical Activity:   . Days of Exercise per Week:   . Minutes of Exercise per Session:  Stress:   . Feeling of Stress :   Social Connections:   . Frequency of Communication with Friends and Family:   . Frequency of Social Gatherings with Friends and Family:   . Attends Religious Services:   . Active Member of Clubs or Organizations:   . Attends Archivist Meetings:   Marland Kitchen Marital Status:   Intimate Partner Violence:   . Fear of Current or Ex-Partner:   . Emotionally Abused:   Marland Kitchen Physically Abused:   . Sexually Abused:       Review of systems: All other review of systems negative except as mentioned in the HPI.   Physical Exam: Vitals:   09/13/19 1513  BP: (!) 140/70  Pulse: (!) 110   Body mass index is 26.64 kg/m. Gen:      No acute distress Abdomen: Soft, no tenderness or distention Neuro: alert and oriented x 3 Psych: normal mood and affect  Data Reviewed:  Reviewed labs, radiology imaging, old records and pertinent past GI work up   Assessment and Plan/Recommendations:  64 year old very pleasant female with history of diabetes, fatty liver secondary to [place], colon cancer, advanced adenomatous colon polyps and H. pylori gastritis  History of colon cancer and advanced adenomatous colon polyps: She is up-to-date with surveillance colonoscopy. 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  History of sulfa allergy She was referred to ID to discuss therapy for H. pylori, has not followed up yet.  She wants to defer  treatment of H. pylori for now until she retires in her current job.  Nausea likely secondary to H. pylori gastritis and dyspepsia Use Zofran 4 mg daily as needed  GERD: Continue AcipHex and antireflux measures  [place]ash: LFT stable Continue with low-carb low-fat diet and exercise Avoid alcohol, NSAIDs or any herbal Remedies can cause potential hepatotoxicity  Return in 6 months or sooner if needed   The patient was provided an opportunity to ask questions and all were answered. The patient agreed with the plan and demonstrated an understanding of the instructions.  Damaris Hippo , MD    CC: Clois Dupes., MD

## 2019-09-16 ENCOUNTER — Encounter: Payer: Self-pay | Admitting: Gastroenterology

## 2019-11-15 ENCOUNTER — Telehealth: Payer: Self-pay | Admitting: *Deleted

## 2019-11-15 MED ORDER — DEXILANT 60 MG PO CPDR
60.0000 mg | DELAYED_RELEASE_CAPSULE | Freq: Every day | ORAL | 3 refills | Status: DC
Start: 2019-11-15 — End: 2020-04-06

## 2019-11-15 NOTE — Telephone Encounter (Signed)
Fax request from [employer] [facility] for PAP for Post faxed today back to [phone]   Changed pt to Dexilant from Aciphex

## 2019-12-19 NOTE — Telephone Encounter (Signed)
Received fax from [facility] PAP that pt was approved to receive free medication until 12/16/2020,   Case number [id number]  Will send approval to be scanned in

## 2019-12-20 ENCOUNTER — Other Ambulatory Visit: Payer: Self-pay

## 2019-12-20 ENCOUNTER — Encounter (HOSPITAL_COMMUNITY): Payer: Self-pay | Admitting: Emergency Medicine

## 2019-12-20 ENCOUNTER — Emergency Department (HOSPITAL_COMMUNITY)
Admission: EM | Admit: 2019-12-20 | Discharge: 2019-12-21 | Disposition: A | Discharge status: Home / Self Care | Payer: PRIVATE HEALTH INSURANCE | Attending: Emergency Medicine | Admitting: Emergency Medicine

## 2019-12-20 DIAGNOSIS — Z85038 Personal history of other malignant neoplasm of large intestine: Secondary | ICD-10-CM | POA: Diagnosis not present

## 2019-12-20 DIAGNOSIS — I159 Secondary hypertension, unspecified: Secondary | ICD-10-CM | POA: Diagnosis not present

## 2019-12-20 DIAGNOSIS — Z794 Long term (current) use of insulin: Secondary | ICD-10-CM | POA: Diagnosis not present

## 2019-12-20 DIAGNOSIS — R42 Dizziness and giddiness: Secondary | ICD-10-CM | POA: Insufficient documentation

## 2019-12-20 DIAGNOSIS — Z91010 Allergy to peanuts: Secondary | ICD-10-CM | POA: Diagnosis not present

## 2019-12-20 DIAGNOSIS — J45909 Unspecified asthma, uncomplicated: Secondary | ICD-10-CM | POA: Insufficient documentation

## 2019-12-20 DIAGNOSIS — Z7984 Long term (current) use of oral hypoglycemic drugs: Secondary | ICD-10-CM | POA: Insufficient documentation

## 2019-12-20 DIAGNOSIS — E119 Type 2 diabetes mellitus without complications: Secondary | ICD-10-CM | POA: Insufficient documentation

## 2019-12-20 LAB — CBC
HCT: 43.6 % (ref 36.0–46.0)
Hemoglobin: 13.3 g/dL (ref 12.0–15.0)
MCH: 27 pg (ref 26.0–34.0)
MCHC: 30.5 g/dL (ref 30.0–36.0)
MCV: 88.4 fL (ref 80.0–100.0)
Platelets: 279 10*3/uL (ref 150–400)
RBC: 4.93 MIL/uL (ref 3.87–5.11)
RDW: 14.2 % (ref 11.5–15.5)
WBC: 11.7 10*3/uL — ABNORMAL HIGH (ref 4.0–10.5)
nRBC: 0 % (ref 0.0–0.2)

## 2019-12-20 LAB — URINALYSIS, ROUTINE W REFLEX MICROSCOPIC
Bilirubin Urine: NEGATIVE
Glucose, UA: NEGATIVE mg/dL
Hgb urine dipstick: NEGATIVE
Ketones, ur: NEGATIVE mg/dL
Leukocytes,Ua: NEGATIVE
Nitrite: NEGATIVE
Protein, ur: NEGATIVE mg/dL
Specific Gravity, Urine: 1.005 (ref 1.005–1.030)
pH: 6 (ref 5.0–8.0)

## 2019-12-20 LAB — BASIC METABOLIC PANEL
Anion gap: 14 (ref 5–15)
BUN: 6 mg/dL — ABNORMAL LOW (ref 8–23)
CO2: 23 mmol/L (ref 22–32)
Calcium: 9.6 mg/dL (ref 8.9–10.3)
Chloride: 102 mmol/L (ref 98–111)
Creatinine, Ser: 0.68 mg/dL (ref 0.44–1.00)
GFR, Estimated: >60 mL/min (ref >60)
Glucose, Bld: 184 mg/dL — ABNORMAL HIGH (ref 70–99)
Potassium: 4.2 mmol/L (ref 3.5–5.1)
Sodium: 139 mmol/L (ref 135–145)

## 2019-12-20 LAB — CBG MONITORING, ED: Glucose-Capillary: 100 mg/dL — ABNORMAL HIGH (ref 70–99)

## 2019-12-20 NOTE — ED Notes (Signed)
Pt was OOB to bathroom. Tolerated well. Pt given crfackers and water due to DM

## 2019-12-20 NOTE — ED Provider Notes (Signed)
[facility] EMERGENCY DEPARTMENT Provider Note  CSN: [id number] Arrival date & time: 12/20/19 1354  Chief Complaint(s) Hypertension and Dizziness  HPI Stephanie Carroll is a 64 y.o. female presented with for dizziness noted with elevated blood pressures.  Patient is on a history of hypertension and reports that her blood pressures usually run 110s to 120s.  She reports that symptoms initially began 2 days ago.  Reports that her blood pressures were in the 170s when she checked it.  She went to outside hospital emergency department where her blood pressure was noted to be 200s.  She reports that she was told her work-up there was reassuring.  States that she was given a pill for blood pressure and discharged.  Patient reports that her dizziness has currently resolved.  She denies any associated chest pain or shortness of breath.  No focal deficits.  No visual disturbances.  Patient believes that her blood pressure recently due to increased stress at work.  Denies any illicit drug use.  Denies any other physical complaints.  HPI  Past Medical History Past Medical History:  Diagnosis Date  . Allergy   . Arthritis    DDD  . Asthma   . Colon cancer ([place]) 2008  . Diabetes mellitus   . Elevated liver enzymes   . GERD (gastroesophageal reflux disease)   . Hyperlipidemia   . Thyroid tumor, benign    Patient Active Problem List   Diagnosis Date Noted  . Internal hemorrhoids 06/30/2014  . Rectal bleeding 09/20/2013  . Personal history of colon cancer, stage I 09/20/2013  . HELICOBACTER PYLORI INFECTION 10/20/2009  . STRICTURE AND STENOSIS OF ESOPHAGUS 10/20/2009  . DYSPHAGIA UNSPECIFIED 07/29/2009  . RECTAL BLEEDING 06/26/2009  . ANAL OR RECTAL PAIN 06/26/2009  . DM 08/27/2008  . NONSPECIFIC ABNORMAL RESULTS LIVR FUNCTION STUDY 08/27/2008  . NAUSEA 08/04/2008  . ABDOMINAL PAIN-RUQ 08/04/2008  . History of malignant neoplasm of large intestine 08/04/2008  .  GASTROESOPHAGEAL REFLUX DISEASE, CHRONIC 06/06/2007  . HEPATOMEGALY 06/06/2007  . Malignant neoplasm of ascending colon ([place]) 10/12/2006   Home Medication(s) Prior to Admission medications   Medication Sig Start Date End Date Taking? Authorizing Provider  albuterol (PROVENTIL HFA;VENTOLIN HFA) 108 (90 BASE) MCG/ACT inhaler Inhale 1 puff into the lungs every 6 (six) hours as needed. For shortness for breath    [provider]  [facility] HFA 200 MCG/ACT AERO SMARTSIG:2 Puff(s) By Mouth Twice Daily 07/01/19   [provider]  BLACK ELDERBERRY PO Take 1 tablet by mouth daily.    [provider]  Cetirizine HCl 10 MG CAPS Take 10 mg by mouth as needed. 04/15/18   [provider]  cholecalciferol (VITAMIN D) 25 MCG (1000 UT) tablet Take 1,000 Units by mouth daily. 02/21/18   [provider]  dexlansoprazole (DEXILANT) 60 MG capsule Take 1 capsule (60 mg total) by mouth daily. 11/15/19   Mauri Pole, MD  EPINEPHrine 0.3 mg/0.3 mL IJ SOAJ injection as needed.    [provider]  guaiFENesin 200 MG tablet Take 400 mg by mouth 2 (two) times daily.    [provider]  insulin glargine (LANTUS) 100 UNIT/ML injection Inject 54 Units into the skin daily.    [provider]  LORazepam (ATIVAN) 0.5 MG tablet Take 0.5 mg by mouth 2 (two) times daily as needed. 07/12/19   [provider]  metFORMIN (GLUCOPHAGE-XR) 500 MG 24 hr tablet Take 1,000 mg by mouth 2 (two) times daily.  [provider]  mometasone (ASMANEX) 220 MCG/INH inhaler Inhale 1 puff into the lungs 2 (two) times daily.      [provider]  ondansetron (ZOFRAN ODT) 4 MG disintegrating tablet Take 1 tablet (4 mg total) by mouth every 8 (eight) hours as needed for nausea or vomiting. 09/13/19   Nandigam, Venia Minks, MD  PROCTOSOL HC 2.5 % rectal cream PLACE 1 APPLICATION RECTALLY 2 (TWO) TIMES DAILY. Patient taking differently: Place 1 application  rectally as needed.  06/24/16   Nandigam, Venia Minks, MD  sucralfate (CARAFATE) 1 g tablet TAKE 1 TABLET (1 G TOTAL) BY MOUTH 2 (TWO) TIMES DAILY. 03/14/19   Mauri Pole, MD  UNABLE TO FIND Med Name: CBD oil PO    [provider]                                                                                                                                    Past Surgical History Past Surgical History:  Procedure Laterality Date  . BIOPSY THYROID     X2  . CESAREAN SECTION     x 1  . COLON SURGERY    . FOOT FRACTURE SURGERY Left 02/2015  . INCISIONAL HERNIA REPAIR    . NASAL SEPTUM SURGERY    . PARTIAL HYSTERECTOMY    . POLYPECTOMY    . UPPER GASTROINTESTINAL ENDOSCOPY    . WRIST GANGLION EXCISION     left   Family History Family History  Problem Relation Age of Onset  . COPD Other        both sides  . Diabetes Other        both sides  . Heart disease Father   . Kidney failure Father   . Lung cancer Paternal Uncle   . Leukemia Paternal Grandfather   . Cervical cancer Paternal Aunt        x 2   . Heart defect Mother        MVP  . Breast cancer Mother   . Colon cancer Neg Hx   . Esophageal cancer Neg Hx   . Rectal cancer Neg Hx   . Stomach cancer Neg Hx     Social History Social History   Tobacco Use  . Smoking status: Never Smoker  . Smokeless tobacco: Never Used  . Tobacco comment: never used product  Vaping Use  . Vaping Use: Never used  Substance Use Topics  . Alcohol use: No    Alcohol/week: 0.0 standard drinks  . Drug use: No   Allergies Fluticasone-salmeterol, Morphine, Peanut-containing drug products, Penicillins, Sulfa antibiotics, Cyclobenzaprine, Loracarbef, Metronidazole, Moxifloxacin hcl in nacl, Other, Peanuts [peanut oil], Penicillin g sodium, Sulfamethazine, Sulfamethoxazole, Sulfonamide derivatives, Ciprofloxacin, and Clindamycin  Review of Systems Review of Systems All other systems are reviewed and are negative for acute  change except as noted in the HPI  Physical Exam Vital Signs  I have reviewed  the triage vital signs BP (!) 150/82   Pulse 89   Temp 98 F (36.7 C) (Oral)   Resp 17   Ht 5' 1"  (1.549 m)   Wt 64.9 kg   SpO2 96%   BMI 27.02 kg/m   Physical Exam Vitals reviewed.  Constitutional:      General: She is not in acute distress.    Appearance: She is well-developed. She is not diaphoretic.  HENT:     Head: Normocephalic and atraumatic.     Nose: Nose normal.  Eyes:     General: No scleral icterus.       Right eye: No discharge.        Left eye: No discharge.     Conjunctiva/sclera: Conjunctivae normal.     Pupils: Pupils are equal, round, and reactive to light.  Cardiovascular:     Rate and Rhythm: Normal rate and regular rhythm.     Heart sounds: No murmur heard.  No friction rub. No gallop.   Pulmonary:     Effort: Pulmonary effort is normal. No respiratory distress.     Breath sounds: Normal breath sounds. No stridor. No rales.  Abdominal:     General: There is no distension.     Palpations: Abdomen is soft.     Tenderness: There is no abdominal tenderness.  Musculoskeletal:        General: No tenderness.     Cervical back: Normal range of motion and neck supple.  Skin:    General: Skin is warm and dry.     Findings: No erythema or rash.  Neurological:     Mental Status: She is alert and oriented to person, place, and time.     ED Results and Treatments Labs (all labs ordered are listed, but only abnormal results are displayed) Labs Reviewed  BASIC METABOLIC PANEL - Abnormal; Notable for the following components:      Result Value   Glucose, Bld 184 (*)    BUN 6 (*)    All other components within normal limits  CBC - Abnormal; Notable for the following components:   WBC 11.7 (*)    All other components within normal limits  URINALYSIS, ROUTINE W REFLEX MICROSCOPIC - Abnormal; Notable for the following components:   Color, Urine STRAW (*)    All other  components within normal limits  CBG MONITORING, ED - Abnormal; Notable for the following components:   Glucose-Capillary 100 (*)    All other components within normal limits                                                                                                                         EKG  EKG Interpretation  Date/Time:  Friday December 20 2019 14:02:48 EDT Ventricular Rate:  123 PR Interval:  144 QRS Duration: 70 QT Interval:  326 QTC Calculation: 466 R Axis:   95 Text Interpretation: Sinus tachycardia Rightward axis No significant change since last tracing Confirmed by  Addison Lank [phone]) on 12/20/2019 11:34:07 PM      Radiology No results found.  Pertinent labs & imaging results that were available during my care of the patient were reviewed by me and considered in my medical decision making (see chart for details).  Medications Ordered in ED Medications - No data to display                                                                                                                                  Procedures Procedures  (including critical care time)  Medical Decision Making / ED Course I have reviewed the nursing notes for this encounter and the patient's prior records (if available in EHR or on provided paperwork).   Taniaya Rudder Morua was evaluated in Emergency Department on 12/21/2019 for the symptoms described in the history of present illness. She was evaluated in the context of the global COVID-19 pandemic, which necessitated consideration that the patient might be at risk for infection with the SARS-CoV-2 virus that causes COVID-19. Institutional protocols and algorithms that pertain to the evaluation of patients at risk for COVID-19 are in a state of rapid change based on information released by regulatory bodies including the CDC and federal and state organizations. These policies and algorithms were followed during the patient's care in the ED.  Patient  presents with dizziness in the setting of elevated blood pressures. No chest pain or shortness of breath. No headache. No focal deficits. Work-up reassuring without evidence of endorgan damage. Blood pressures here remained between the 130s and 160s. No intervention necessary at this time. Patient already has follow-up appointment with her primary care provider next 4 days.     Final Clinical Impression(s) / ED Diagnoses Final diagnoses:  Secondary hypertension  Dizziness   The patient appears reasonably screened and/or stabilized for discharge and I doubt any other medical condition or other [facility] requiring further screening, evaluation, or treatment in the ED at this time prior to discharge. Safe for discharge with strict return precautions.  Disposition: Discharge  Condition: Good  I have discussed the results, Dx and Tx plan with the patient/family who expressed understanding and agree(s) with the plan. Discharge instructions discussed at length. The patient/family was given strict return precautions who verbalized understanding of the instructions. No further questions at time of discharge.    ED Discharge Orders    None      Follow Up: Clois Dupes., MD [address] [place] [address] [phone]  On 12/24/2019 as scheduled      This chart was dictated using voice recognition software.  Despite best efforts to proofread,  errors can occur which can change the documentation meaning.   Fatima Blank, MD 12/21/19 [phone]

## 2019-12-20 NOTE — ED Triage Notes (Signed)
Pt reports she began feeling dizzy while working at home yesterday, states she checked her bp and it was high. No hx of htn. Went to hospital in [employer] and was told everything looked fine, was given meds there to bring bp down and advised to f/u with pcp but cannot see them until Tuesday so she came here for further eval. Reports some ongoing dizziness, mostly when she turns her head. Speech clear, face symmetrical, moves all limbs equally, nad.

## 2020-01-15 ENCOUNTER — Telehealth: Payer: Self-pay

## 2020-01-15 NOTE — Telephone Encounter (Signed)
Pt called to r.s her 01/17/20 appt to 03/2020, done... AOM

## 2020-01-17 ENCOUNTER — Inpatient Hospital Stay: Payer: PRIVATE HEALTH INSURANCE | Admitting: Hematology & Oncology

## 2020-01-17 ENCOUNTER — Inpatient Hospital Stay: Payer: PRIVATE HEALTH INSURANCE

## 2020-03-18 ENCOUNTER — Other Ambulatory Visit: Payer: Self-pay

## 2020-03-18 ENCOUNTER — Encounter: Payer: Self-pay | Admitting: Hematology & Oncology

## 2020-03-18 ENCOUNTER — Inpatient Hospital Stay: Payer: 59 | Attending: Hematology & Oncology

## 2020-03-18 ENCOUNTER — Inpatient Hospital Stay (HOSPITAL_BASED_OUTPATIENT_CLINIC_OR_DEPARTMENT_OTHER): Payer: 59 | Admitting: Hematology & Oncology

## 2020-03-18 VITALS — BP 137/67 | HR 103 | Temp 98.3°F | Resp 18 | Wt 143.0 lb

## 2020-03-18 DIAGNOSIS — E119 Type 2 diabetes mellitus without complications: Secondary | ICD-10-CM | POA: Insufficient documentation

## 2020-03-18 DIAGNOSIS — H538 Other visual disturbances: Secondary | ICD-10-CM | POA: Insufficient documentation

## 2020-03-18 DIAGNOSIS — Z882 Allergy status to sulfonamides status: Secondary | ICD-10-CM | POA: Diagnosis not present

## 2020-03-18 DIAGNOSIS — Z881 Allergy status to other antibiotic agents status: Secondary | ICD-10-CM | POA: Diagnosis not present

## 2020-03-18 DIAGNOSIS — C189 Malignant neoplasm of colon, unspecified: Secondary | ICD-10-CM | POA: Insufficient documentation

## 2020-03-18 DIAGNOSIS — C182 Malignant neoplasm of ascending colon: Secondary | ICD-10-CM

## 2020-03-18 DIAGNOSIS — Z880 Allergy status to penicillin: Secondary | ICD-10-CM | POA: Diagnosis not present

## 2020-03-18 DIAGNOSIS — R7989 Other specified abnormal findings of blood chemistry: Secondary | ICD-10-CM | POA: Insufficient documentation

## 2020-03-18 DIAGNOSIS — Z79899 Other long term (current) drug therapy: Secondary | ICD-10-CM | POA: Insufficient documentation

## 2020-03-18 DIAGNOSIS — Z885 Allergy status to narcotic agent status: Secondary | ICD-10-CM | POA: Diagnosis not present

## 2020-03-18 LAB — CBC WITH DIFFERENTIAL (CANCER CENTER ONLY)
Abs Immature Granulocytes: 0.03 10*3/uL (ref 0.00–0.07)
Basophils Absolute: 0.1 10*3/uL (ref 0.0–0.1)
Basophils Relative: 1 %
Eosinophils Absolute: 0.2 10*3/uL (ref 0.0–0.5)
Eosinophils Relative: 2 %
HCT: 36.2 % (ref 36.0–46.0)
Hemoglobin: 11.8 g/dL — ABNORMAL LOW (ref 12.0–15.0)
Immature Granulocytes: 0 %
Lymphocytes Relative: 31 %
Lymphs Abs: 2.9 10*3/uL (ref 0.7–4.0)
MCH: 27.4 pg (ref 26.0–34.0)
MCHC: 32.6 g/dL (ref 30.0–36.0)
MCV: 84.2 fL (ref 80.0–100.0)
Monocytes Absolute: 0.6 10*3/uL (ref 0.1–1.0)
Monocytes Relative: 7 %
Neutro Abs: 5.6 10*3/uL (ref 1.7–7.7)
Neutrophils Relative %: 59 %
Platelet Count: 245 10*3/uL (ref 150–400)
RBC: 4.3 MIL/uL (ref 3.87–5.11)
RDW: 14.1 % (ref 11.5–15.5)
WBC Count: 9.4 10*3/uL (ref 4.0–10.5)
nRBC: 0 % (ref 0.0–0.2)

## 2020-03-18 LAB — CMP (CANCER CENTER ONLY)
ALT: 53 U/L — ABNORMAL HIGH (ref 0–44)
AST: 46 U/L — ABNORMAL HIGH (ref 15–41)
Albumin: 4.4 g/dL (ref 3.5–5.0)
Alkaline Phosphatase: 86 U/L (ref 38–126)
Anion gap: 11 (ref 5–15)
BUN: 15 mg/dL (ref 8–23)
CO2: 25 mmol/L (ref 22–32)
Calcium: 10.1 mg/dL (ref 8.9–10.3)
Chloride: 101 mmol/L (ref 98–111)
Creatinine: 0.74 mg/dL (ref 0.44–1.00)
GFR, Estimated: >60 mL/min (ref >60)
Glucose, Bld: 228 mg/dL — ABNORMAL HIGH (ref 70–99)
Potassium: 4.1 mmol/L (ref 3.5–5.1)
Sodium: 137 mmol/L (ref 135–145)
Total Bilirubin: 0.3 mg/dL (ref 0.3–1.2)
Total Protein: 7.3 g/dL (ref 6.5–8.1)

## 2020-03-18 NOTE — Progress Notes (Signed)
Hematology and Oncology Follow Up Visit  Stephanie Carroll [id number] August 03, 1955 65 y.o. 03/18/2020   Principle Diagnosis:  Stage II carcinoma of colon  Current Therapy:   Observation   Interim History:  Stephanie Carroll is here today for follow-up.  We see her every 6 months.  She is doing okay right now.  She is retired.  I think that she will enjoy retirement.  Her big problem is the diabetes.  She still has problems with her blood sugar.  She does see an endocrinologist.  Today, her blood sugar was 228.  She had her last CEA back in June 2021 of 1.71.  There has been no problems with bowels or bladder.  She has had no bleeding.  There is been no melena.  She has had no problems with fever.  She did have the coronavirus back in November.  She was not hospitalized.  She has had no headache.  There has been no blurred vision.  Overall, her performance status is ECOG 1.    Medications:  Allergies as of 03/18/2020      Reactions   Fluticasone-salmeterol Other (See Comments), Palpitations   Morphine Other (See Comments), Palpitations   REACTION: heart racing   Peanut-containing Drug Products Cough   Penicillins Rash   REACTION: rash   Sulfa Antibiotics Other (See Comments)   Cyclobenzaprine Other (See Comments)   Loracarbef Other (See Comments)   Derm.   Metronidazole    Moxifloxacin Hcl In Nacl    Other    Oats and Green peppers---headaches   Peanuts [peanut Oil] Other (See Comments)   congested   Penicillin G Sodium Other (See Comments)   Derm.   Sulfamethazine    Sulfamethoxazole Nausea Only   Sulfonamide Derivatives    Ciprofloxacin Anxiety, Other (See Comments)   Clindamycin Rash      Medication List       Accurate as of March 18, 2020  1:13 PM. If you have any questions, ask your nurse or doctor.        albuterol 108 (90 Base) MCG/ACT inhaler Commonly known as: VENTOLIN HFA Inhale 1 puff into the lungs every 6 (six) hours as needed. For shortness for breath    BLACK ELDERBERRY PO Take 1 tablet by mouth daily.   Cetirizine HCl 10 MG Caps Take 10 mg by mouth as needed.   cholecalciferol 25 MCG (1000 UNIT) tablet Commonly known as: VITAMIN D Take 1,000 Units by mouth daily.   Dexilant 60 MG capsule Generic drug: dexlansoprazole Take 1 capsule (60 mg total) by mouth daily.   EPINEPHrine 0.3 mg/0.3 mL Soaj injection Commonly known as: EPI-PEN as needed.   guaiFENesin 200 MG tablet Take 400 mg by mouth 2 (two) times daily.   insulin glargine 100 UNIT/ML injection Commonly known as: LANTUS Inject 54 Units into the skin daily.   LORazepam 0.5 MG tablet Commonly known as: ATIVAN Take 0.5 mg by mouth 2 (two) times daily as needed.   losartan 25 MG tablet Commonly known as: COZAAR Take 25 mg by mouth daily.   metFORMIN 500 MG 24 hr tablet Commonly known as: GLUCOPHAGE-XR Take 1,000 mg by mouth 2 (two) times daily.   mometasone 220 MCG/INH inhaler Commonly known as: ASMANEX Inhale 1 puff into the lungs 2 (two) times daily.   Asmanex HFA 200 MCG/ACT Aero Generic drug: Mometasone Furoate SMARTSIG:2 Puff(s) By Mouth Twice Daily   ondansetron 4 MG disintegrating tablet Commonly known as: Zofran ODT Take 1 tablet (4  mg total) by mouth every 8 (eight) hours as needed for nausea or vomiting.   Proctosol HC 2.5 % rectal cream Generic drug: hydrocortisone PLACE 1 APPLICATION RECTALLY 2 (TWO) TIMES DAILY. What changed:   when to take this  reasons to take this   RABEprazole 20 MG tablet Commonly known as: ACIPHEX Take 20 mg by mouth daily.   sucralfate 1 g tablet Commonly known as: CARAFATE TAKE 1 TABLET (1 G TOTAL) BY MOUTH 2 (TWO) TIMES DAILY.   UNABLE TO FIND Med Name: CBD oil PO       Allergies:  Allergies  Allergen Reactions  . Fluticasone-Salmeterol Other (See Comments) and Palpitations  . Morphine Other (See Comments) and Palpitations    REACTION: heart racing  . Peanut-Containing Drug Products Cough  .  Penicillins Rash    REACTION: rash  . Sulfa Antibiotics Other (See Comments)  . Cyclobenzaprine Other (See Comments)  . Loracarbef Other (See Comments)    Derm.   . Metronidazole   . Moxifloxacin Hcl In Nacl   . Other     Oats and Green peppers---headaches  . Peanuts [Peanut Oil] Other (See Comments)    congested  . Penicillin G Sodium Other (See Comments)    Derm.  . Sulfamethazine   . Sulfamethoxazole Nausea Only  . Sulfonamide Derivatives   . Ciprofloxacin Anxiety and Other (See Comments)  . Clindamycin Rash    Past Medical History, Surgical history, Social history, and Family History were reviewed and updated.  Review of Systems: Review of Systems  Constitutional: Negative.   HENT: Negative.   Eyes: Positive for blurred vision.  Respiratory: Negative.   Cardiovascular: Negative.   Gastrointestinal: Negative.   Genitourinary: Negative.   Musculoskeletal: Negative.   Skin: Negative.   Neurological: Negative.   Endo/Heme/Allergies: Negative.   Psychiatric/Behavioral: Negative.       Physical Exam:  weight is 143 lb (64.9 kg). Her oral temperature is 98.3 F (36.8 C). Her blood pressure is 137/67 and her pulse is 103 (abnormal). Her respiration is 18 and oxygen saturation is 98%.   Wt Readings from Last 3 Encounters:  03/18/20 143 lb (64.9 kg)  12/20/19 143 lb (64.9 kg)  09/13/19 141 lb (64 kg)    Physical Exam Vitals reviewed.  HENT:     Head: Normocephalic and atraumatic.  Eyes:     Pupils: Pupils are equal, round, and reactive to light.  Cardiovascular:     Rate and Rhythm: Normal rate and regular rhythm.     Heart sounds: Normal heart sounds.  Pulmonary:     Effort: Pulmonary effort is normal.     Breath sounds: Normal breath sounds.  Abdominal:     General: Bowel sounds are normal.     Palpations: Abdomen is soft.  Musculoskeletal:        General: No tenderness or deformity. Normal range of motion.     Cervical back: Normal range of motion.   Lymphadenopathy:     Cervical: No cervical adenopathy.  Skin:    General: Skin is warm and dry.     Findings: No erythema or rash.  Neurological:     Mental Status: She is alert and oriented to person, place, and time.  Psychiatric:        Behavior: Behavior normal.        Thought Content: Thought content normal.        Judgment: Judgment normal.      Lab Results  Component Value Date  WBC 9.4 03/18/2020   HGB 11.8 (L) 03/18/2020   HCT 36.2 03/18/2020   MCV 84.2 03/18/2020   PLT 245 03/18/2020   Lab Results  Component Value Date   FERRITIN 26.3 06/19/2018   IRON 58 08/08/2008   IRONPCTSAT 13.3 (L) 08/08/2008   Lab Results  Component Value Date   RETICCTPCT 2.0 03/01/2007   RBC 4.30 03/18/2020   RETICCTABS 101.4 03/01/2007   No results found for: Nils Pyle [facility] Lab Results  Component Value Date   IGA 371 06/19/2018   No results found for: Odetta Pink, SPEI   Chemistry      Component Value Date/Time   NA 137 03/18/2020 1122   NA 126 (L) 04/22/2016 1456   NA 141 11/29/2013 1454   K 4.1 03/18/2020 1122   K 4.1 04/22/2016 1456   K 3.8 11/29/2013 1454   CL 101 03/18/2020 1122   CL 95 (L) 04/22/2016 1456   CL 98 11/29/2013 1454   CO2 25 03/18/2020 1122   CO2 21 04/22/2016 1456   CO2 25 11/29/2013 1454   BUN 15 03/18/2020 1122   BUN 6 04/22/2016 1456   BUN 12 11/29/2013 1454   CREATININE 0.74 03/18/2020 1122   CREATININE 0.55 (L) 04/22/2016 1456   CREATININE 0.8 11/29/2013 1454      Component Value Date/Time   CALCIUM 10.1 03/18/2020 1122   CALCIUM 9.5 04/22/2016 1456   CALCIUM 9.2 11/29/2013 1454   ALKPHOS 86 03/18/2020 1122   ALKPHOS 103 04/22/2016 1456   ALKPHOS 93 (H) 11/29/2013 1454   AST 46 (H) 03/18/2020 1122   ALT 53 (H) 03/18/2020 1122   ALT 36 11/29/2013 1454   BILITOT 0.3 03/18/2020 1122      Impression and Plan: Stephanie Carroll is a very pleasant 65 yo caucasian  female with history of 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 She did not require any adjuvant therapy.   Again, her LFTs are elevated, but they continue to improve.     I will plan to get her back in 6 more months.    Volanda Napoleon, MD 2/2/20221:13 PM

## 2020-03-19 LAB — CEA (IN HOUSE-CHCC): CEA (CHCC-In House): 1.52 ng/mL (ref 0.00–5.00)

## 2020-03-29 ENCOUNTER — Ambulatory Visit
Admission: EM | Admit: 2020-03-29 | Discharge: 2020-03-29 | Disposition: A | Discharge status: Home / Self Care | Payer: 59 | Attending: Emergency Medicine | Admitting: Emergency Medicine

## 2020-03-29 ENCOUNTER — Emergency Department (HOSPITAL_COMMUNITY): Payer: 59

## 2020-03-29 ENCOUNTER — Encounter (HOSPITAL_COMMUNITY): Payer: Self-pay | Admitting: Emergency Medicine

## 2020-03-29 ENCOUNTER — Other Ambulatory Visit: Payer: Self-pay

## 2020-03-29 ENCOUNTER — Emergency Department (HOSPITAL_COMMUNITY)
Admission: EM | Admit: 2020-03-29 | Discharge: 2020-03-29 | Disposition: A | Discharge status: Home / Self Care | Payer: 59 | Attending: Emergency Medicine | Admitting: Emergency Medicine

## 2020-03-29 DIAGNOSIS — Z7984 Long term (current) use of oral hypoglycemic drugs: Secondary | ICD-10-CM | POA: Insufficient documentation

## 2020-03-29 DIAGNOSIS — R0789 Other chest pain: Secondary | ICD-10-CM | POA: Diagnosis present

## 2020-03-29 DIAGNOSIS — Z91010 Allergy to peanuts: Secondary | ICD-10-CM | POA: Insufficient documentation

## 2020-03-29 DIAGNOSIS — J45909 Unspecified asthma, uncomplicated: Secondary | ICD-10-CM | POA: Diagnosis not present

## 2020-03-29 DIAGNOSIS — Z85038 Personal history of other malignant neoplasm of large intestine: Secondary | ICD-10-CM | POA: Diagnosis not present

## 2020-03-29 DIAGNOSIS — E119 Type 2 diabetes mellitus without complications: Secondary | ICD-10-CM | POA: Diagnosis not present

## 2020-03-29 DIAGNOSIS — Z794 Long term (current) use of insulin: Secondary | ICD-10-CM | POA: Insufficient documentation

## 2020-03-29 DIAGNOSIS — R079 Chest pain, unspecified: Secondary | ICD-10-CM

## 2020-03-29 LAB — CBC WITH DIFFERENTIAL/PLATELET
Abs Immature Granulocytes: 0.04 10*3/uL (ref 0.00–0.07)
Basophils Absolute: 0.1 10*3/uL (ref 0.0–0.1)
Basophils Relative: 1 %
Eosinophils Absolute: 0.1 10*3/uL (ref 0.0–0.5)
Eosinophils Relative: 1 %
HCT: 38.9 % (ref 36.0–46.0)
Hemoglobin: 12.4 g/dL (ref 12.0–15.0)
Immature Granulocytes: 0 %
Lymphocytes Relative: 18 %
Lymphs Abs: 1.7 10*3/uL (ref 0.7–4.0)
MCH: 27.6 pg (ref 26.0–34.0)
MCHC: 31.9 g/dL (ref 30.0–36.0)
MCV: 86.4 fL (ref 80.0–100.0)
Monocytes Absolute: 0.5 10*3/uL (ref 0.1–1.0)
Monocytes Relative: 5 %
Neutro Abs: 7.2 10*3/uL (ref 1.7–7.7)
Neutrophils Relative %: 75 %
Platelets: 243 10*3/uL (ref 150–400)
RBC: 4.5 MIL/uL (ref 3.87–5.11)
RDW: 14.3 % (ref 11.5–15.5)
WBC: 9.6 10*3/uL (ref 4.0–10.5)
nRBC: 0 % (ref 0.0–0.2)

## 2020-03-29 LAB — TROPONIN I (HIGH SENSITIVITY)
Troponin I (High Sensitivity): 3 ng/L (ref <18)
Troponin I (High Sensitivity): 2 ng/L (ref <18)

## 2020-03-29 LAB — BASIC METABOLIC PANEL
Anion gap: 12 (ref 5–15)
BUN: 7 mg/dL — ABNORMAL LOW (ref 8–23)
CO2: 22 mmol/L (ref 22–32)
Calcium: 9.7 mg/dL (ref 8.9–10.3)
Chloride: 102 mmol/L (ref 98–111)
Creatinine, Ser: 0.62 mg/dL (ref 0.44–1.00)
GFR, Estimated: >60 mL/min (ref >60)
Glucose, Bld: 173 mg/dL — ABNORMAL HIGH (ref 70–99)
Potassium: 3.9 mmol/L (ref 3.5–5.1)
Sodium: 136 mmol/L (ref 135–145)

## 2020-03-29 NOTE — ED Triage Notes (Signed)
Pt is having pain in LT chest that started yesterday.  Pt takes acid medication everyday and has been taking tums as well with minimal relief.

## 2020-03-29 NOTE — ED Notes (Signed)
Patient is being discharged from the Urgent Care and sent to the Emergency Department via POV . Per K. Avegno, NP, patient is in need of higher level of care due to CP. Patient is aware and verbalizes understanding of plan of care.  Vitals:   03/29/20 1138  BP: (!) 187/93  Pulse: (!) 129  Resp: 18  Temp: 97.9 F (36.6 C)  SpO2: 96%

## 2020-03-29 NOTE — ED Provider Notes (Signed)
[facility] EMERGENCY DEPARTMENT Provider Note   CSN: [id number] Arrival date & time: 03/29/20  1145     History Chief Complaint  Patient presents with  . Chest Pain    Stephanie Carroll is a 65 y.o. female.  HPI She presents for evaluation of a tight feeling in her chest, present since yesterday, waxing waning somewhat.  The pain improved last night she was able to sleep but noticed the pain there on awakening.  Today she went to the urgent care, then they referred her here for evaluation.  She denies shortness of breath, nausea, vomiting, radiation of pain to left arm or neck.  She states she has been doing some extra work around the house recently and wonders if may be she has some muscle ache, related to that.  No prior cardiac history.  She states her blood pressure is typically mildly elevated and her heart rate tends to run about 110 bpm.  She takes her blood pressure medicine at nighttime.  She denies other recent illnesses.  There are no other known active modifying factors.    Past Medical History:  Diagnosis Date  . Allergy   . Arthritis    DDD  . Asthma   . Colon cancer ([place]) 2008  . Diabetes mellitus   . Elevated liver enzymes   . GERD (gastroesophageal reflux disease)   . Hyperlipidemia   . Thyroid tumor, benign     Patient Active Problem List   Diagnosis Date Noted  . Internal hemorrhoids 06/30/2014  . Rectal bleeding 09/20/2013  . Personal history of colon cancer, stage I 09/20/2013  . HELICOBACTER PYLORI INFECTION 10/20/2009  . STRICTURE AND STENOSIS OF ESOPHAGUS 10/20/2009  . DYSPHAGIA UNSPECIFIED 07/29/2009  . RECTAL BLEEDING 06/26/2009  . ANAL OR RECTAL PAIN 06/26/2009  . DM 08/27/2008  . NONSPECIFIC ABNORMAL RESULTS LIVR FUNCTION STUDY 08/27/2008  . NAUSEA 08/04/2008  . ABDOMINAL PAIN-RUQ 08/04/2008  . History of malignant neoplasm of large intestine 08/04/2008  . GASTROESOPHAGEAL REFLUX DISEASE, CHRONIC 06/06/2007  . HEPATOMEGALY 06/06/2007  .  Malignant neoplasm of ascending colon ([place]) 10/12/2006    Past Surgical History:  Procedure Laterality Date  . BIOPSY THYROID     X2  . CESAREAN SECTION     x 1  . COLON SURGERY    . FOOT FRACTURE SURGERY Left 02/2015  . INCISIONAL HERNIA REPAIR    . NASAL SEPTUM SURGERY    . PARTIAL HYSTERECTOMY    . POLYPECTOMY    . UPPER GASTROINTESTINAL ENDOSCOPY    . WRIST GANGLION EXCISION     left     OB History   No obstetric history on file.     Family History  Problem Relation Age of Onset  . COPD Other        both sides  . Diabetes Other        both sides  . Heart disease Father   . Kidney failure Father   . Lung cancer Paternal Uncle   . Leukemia Paternal Grandfather   . Cervical cancer Paternal Aunt        x 2   . Heart defect Mother        MVP  . Breast cancer Mother   . Colon cancer Neg Hx   . Esophageal cancer Neg Hx   . Rectal cancer Neg Hx   . Stomach cancer Neg Hx     Social History   Tobacco Use  . Smoking status: Never Smoker  .  Smokeless tobacco: Never Used  . Tobacco comment: never used product  Vaping Use  . Vaping Use: Never used  Substance Use Topics  . Alcohol use: No    Alcohol/week: 0.0 standard drinks  . Drug use: No    Home Medications Prior to Admission medications   Medication Sig Start Date End Date Taking? Authorizing Provider  albuterol (PROVENTIL HFA;VENTOLIN HFA) 108 (90 BASE) MCG/ACT inhaler Inhale 1 puff into the lungs every 6 (six) hours as needed. For shortness for breath    [provider]  [facility] HFA 200 MCG/ACT AERO SMARTSIG:2 Puff(s) By Mouth Twice Daily 07/01/19   [provider]  BLACK ELDERBERRY PO Take 1 tablet by mouth daily.    [provider]  Cetirizine HCl 10 MG CAPS Take 10 mg by mouth as needed. 04/15/18   [provider]  cholecalciferol (VITAMIN D) 25 MCG (1000 UT) tablet Take 1,000 Units by mouth daily. 02/21/18   [provider]  dexlansoprazole (DEXILANT) 60 MG  capsule Take 1 capsule (60 mg total) by mouth daily. 11/15/19   Mauri Pole, MD  EPINEPHrine 0.3 mg/0.3 mL IJ SOAJ injection as needed.    [provider]  guaiFENesin 200 MG tablet Take 400 mg by mouth 2 (two) times daily.    [provider]  insulin glargine (LANTUS) 100 UNIT/ML injection Inject 54 Units into the skin daily.    [provider]  LORazepam (ATIVAN) 0.5 MG tablet Take 0.5 mg by mouth 2 (two) times daily as needed. 07/12/19   [provider]  losartan (COZAAR) 25 MG tablet Take 25 mg by mouth daily. 12/24/19   [provider]  metFORMIN (GLUCOPHAGE-XR) 500 MG 24 hr tablet Take 1,000 mg by mouth 2 (two) times daily.    [provider]  mometasone (ASMANEX) 220 MCG/INH inhaler Inhale 1 puff into the lungs 2 (two) times daily.      [provider]  ondansetron (ZOFRAN ODT) 4 MG disintegrating tablet Take 1 tablet (4 mg total) by mouth every 8 (eight) hours as needed for nausea or vomiting. 09/13/19   Nandigam, Venia Minks, MD  PROCTOSOL HC 2.5 % rectal cream PLACE 1 APPLICATION RECTALLY 2 (TWO) TIMES DAILY. Patient taking differently: Place 1 application rectally as needed.  06/24/16   Mauri Pole, MD  RABEprazole (ACIPHEX) 20 MG tablet Take 20 mg by mouth daily. 03/15/20   [provider]  sucralfate (CARAFATE) 1 g tablet TAKE 1 TABLET (1 G TOTAL) BY MOUTH 2 (TWO) TIMES DAILY. 03/14/19   Mauri Pole, MD  UNABLE TO FIND Med Name: CBD oil PO    [provider]    Allergies    Fluticasone-salmeterol, Morphine, Peanut-containing drug products, Penicillins, Sulfa antibiotics, Cyclobenzaprine, Loracarbef, Metronidazole, Moxifloxacin hcl in nacl, Other, Peanuts [peanut oil], Penicillin g sodium, Sulfamethazine, Sulfamethoxazole, Sulfonamide derivatives, Ciprofloxacin, and Clindamycin  Review of Systems   Review of Systems  All other systems reviewed and are negative.   Physical Exam Updated  Vital Signs BP 138/77   Pulse (!) 103   Temp 98 F (36.7 C) (Oral)   Resp 19   Ht 5' 1"  (1.549 m)   Wt 64.9 kg   SpO2 97%   BMI 27.02 kg/m   Physical Exam Vitals and nursing note reviewed.  Constitutional:      General: She is not in acute distress.    Appearance: She is well-developed and well-nourished. She is not ill-appearing, toxic-appearing or diaphoretic.  HENT:  Head: Normocephalic and atraumatic.     Right Ear: External ear normal.     Left Ear: External ear normal.  Eyes:     Extraocular Movements: EOM normal.     Conjunctiva/sclera: Conjunctivae normal.     Pupils: Pupils are equal, round, and reactive to light.  Neck:     Trachea: Phonation normal.  Cardiovascular:     Rate and Rhythm: Normal rate and regular rhythm.     Heart sounds: Normal heart sounds.  Pulmonary:     Effort: Pulmonary effort is normal.     Breath sounds: Normal breath sounds.     Comments: Mild left anterior chest wall.  No associated crepitation or instability. Chest:     Chest wall: Tenderness (Mild left anterior chest wall tenderness to palpation) present. No bony tenderness.  Abdominal:     General: There is no distension.     Palpations: Abdomen is soft.     Tenderness: There is no abdominal tenderness.  Musculoskeletal:        General: Normal range of motion.     Cervical back: Normal range of motion and neck supple.  Skin:    General: Skin is warm, dry and intact.  Neurological:     Mental Status: She is alert and oriented to person, place, and time.     Cranial Nerves: No cranial nerve deficit.     Sensory: No sensory deficit.     Motor: No abnormal muscle tone.     Coordination: Coordination normal.  Psychiatric:        Mood and Affect: Mood and affect and mood normal.        Behavior: Behavior normal.        Thought Content: Thought content normal.        Judgment: Judgment normal.     ED Results / Procedures / Treatments   Labs (all labs ordered are listed,  but only abnormal results are displayed) Labs Reviewed  BASIC METABOLIC PANEL - Abnormal; Notable for the following components:      Result Value   Glucose, Bld 173 (*)    BUN 7 (*)    All other components within normal limits  CBC WITH DIFFERENTIAL/PLATELET  TROPONIN I (HIGH SENSITIVITY)  TROPONIN I (HIGH SENSITIVITY)    EKG None  Radiology DG Chest Port 1 View  Result Date: 03/29/2020 CLINICAL DATA:  Acute left chest pain EXAM: PORTABLE CHEST 1 VIEW COMPARISON:  01/14/2020 FINDINGS: The heart size and mediastinal contours are within normal limits. Both lungs are clear. The visualized skeletal structures are unremarkable. IMPRESSION: No active disease. Electronically Signed   By: Jerilynn Mages.  Shick M.D.   On: 03/29/2020 13:26    Procedures Procedures   Medications Ordered in ED Medications - No data to display  ED Course  I have reviewed the triage vital signs and the nursing notes.  Pertinent labs & imaging results that were available during my care of the patient were reviewed by me and considered in my medical decision making (see chart for details).    MDM Rules/Calculators/A&P                           Patient Vitals for the past 24 hrs:  BP Temp Temp src Pulse Resp SpO2 Height Weight  03/29/20 1530 138/77 -- -- (!) 103 19 97 % -- --  03/29/20 1500 (!) 155/77 -- -- (!) 109 17 95 % -- --  03/29/20 1445 -- -- -- Marland Kitchen  112 18 98 % -- --  03/29/20 1430 (!) 151/85 -- -- (!) 106 20 95 % -- --  03/29/20 1415 -- -- -- (!) 102 14 96 % -- --  03/29/20 1400 (!) 151/80 -- -- (!) 110 16 95 % -- --  03/29/20 1345 -- -- -- (!) 105 14 94 % -- --  03/29/20 1330 (!) 147/75 -- -- (!) 107 14 97 % -- --  03/29/20 1315 -- -- -- (!) 114 19 93 % -- --  03/29/20 1300 (!) 150/72 -- -- (!) 109 11 98 % -- --  03/29/20 1239 -- -- -- -- -- -- 5' 1"  (1.549 m) 64.9 kg  03/29/20 1237 (!) 166/85 98 F (36.7 C) Oral (!) 124 17 99 % -- --    3:20 PM reevaluation with update and discussion. After initial  assessment and treatment, an updated evaluation reveals she is feeling better spontaneously and has no further complaints.  Awaiting delta troponin. Daleen Bo   Medical Decision Making:  This patient is presenting for evaluation of chest discomfort, atraumatic, which does require a range of treatment options, and is a complaint that involves a moderate risk of morbidity and mortality. The differential diagnoses include ACS, chest wall pain, intrathoracic abnormality. I decided to review old records, and in summary elderly female presenting for evaluation of atraumatic chest pain, without other worrisome findings.  No prior similar problems or prior cardiac disorders..  I did not require additional historical information from anyone.  Clinical Laboratory Tests Ordered, included CBC, Metabolic panel and Troponin. Review indicates normal findings except elevated glucose and BUN low. Radiologic Tests Ordered, included chest x-ray.  I independently Visualized: Radiographic images, which show no infiltrate or edema  Cardiac Monitor Tracing which shows sinus tachycardia    Critical Interventions-clinical evaluation, laboratory testing, chest x-ray, observation and reassess  After These Interventions, the Patient was reevaluated and was found to require repeat troponin to ensure stable for discharge.  Patient with mild sinus tachycardia but routinely has heart rates around 110 per her report.  CRITICAL CARE-no Performed by: Daleen Bo  Nursing Notes Reviewed/ Care Coordinated Applicable Imaging Reviewed Interpretation of Laboratory Data incorporated into ED treatment  Plan-disposition by Dr. Alvino Chapel following return of delta troponin.    Final Clinical Impression(s) / ED Diagnoses Final diagnoses:  Nonspecific chest pain    Rx / DC Orders ED Discharge Orders    None       Daleen Bo, MD 03/30/20 [phone]

## 2020-03-29 NOTE — ED Provider Notes (Signed)
  Physical Exam  BP 138/77   Pulse (!) 103   Temp 98 F (36.7 C) (Oral)   Resp 19   Ht 5' 1"  (1.549 m)   Wt 64.9 kg   SpO2 97%   BMI 27.02 kg/m   Physical Exam  ED Course/Procedures     Procedures  MDM  Received patient in signout.  Chest pain.  Left-sided.  Reproducible with palpation.  EKG reassuring.  Chest x-ray reassuring.  Troponin negative x2.  No rash.  Patient is however tachycardic.  Reviewing records this appears chronic for her.  Doubt pulmonary embolism.  Discharge home.       Davonna Belling, MD 03/29/20 [phone]

## 2020-04-06 ENCOUNTER — Telehealth: Payer: Self-pay | Admitting: Gastroenterology

## 2020-04-06 MED ORDER — RABEPRAZOLE SODIUM 20 MG PO TBEC: 20.0000 mg | DELAYED_RELEASE_TABLET | Freq: Two times a day (BID) | ORAL | 3 refills | Status: DC

## 2020-04-06 NOTE — Telephone Encounter (Signed)
Pt is requesting a refill for 2 pills a day on her Aciphex.  [facility] [place]

## 2020-04-06 NOTE — Telephone Encounter (Signed)
Aciphex twice daily sent to patients pharmacy   Dr Darleen Crocker  She was taking it once a day, but she wants twice a day  Discontinued Dexilant

## 2020-04-07 NOTE — Telephone Encounter (Signed)
Ok to change to Acophex twice daily. Thanks

## 2020-04-08 NOTE — Telephone Encounter (Signed)
Called patient to inform and she is saying it needs a prior authorization, told her to contact her pharmacy and have them fax prior auth request to [phone]

## 2020-04-08 NOTE — Telephone Encounter (Signed)
Patient is calling to follow up on request

## 2020-04-08 NOTE — Telephone Encounter (Signed)
Aciphex was sent on the 21st to her pharmacy

## 2020-04-09 NOTE — Telephone Encounter (Signed)
Received fax from pharmacy, Patient requested prior authorization for a qty exception for twice daily, submitted through Cover My Meds today

## 2020-04-10 NOTE — Telephone Encounter (Signed)
Twice a day Aciphex approved until 04/08/2021  Sent approval to be scanned in

## 2020-07-17 ENCOUNTER — Other Ambulatory Visit: Payer: PRIVATE HEALTH INSURANCE

## 2020-07-17 ENCOUNTER — Ambulatory Visit: Payer: PRIVATE HEALTH INSURANCE | Admitting: Hematology & Oncology

## 2020-08-07 ENCOUNTER — Other Ambulatory Visit: Payer: Self-pay | Admitting: Gastroenterology

## 2020-08-24 LAB — HM DIABETES EYE EXAM

## 2020-08-25 ENCOUNTER — Other Ambulatory Visit: Payer: Self-pay | Admitting: Internal Medicine

## 2020-08-25 ENCOUNTER — Ambulatory Visit: Payer: 59 | Admitting: Internal Medicine

## 2020-08-25 ENCOUNTER — Other Ambulatory Visit: Payer: Self-pay

## 2020-08-25 ENCOUNTER — Encounter: Payer: Self-pay | Admitting: Internal Medicine

## 2020-08-25 DIAGNOSIS — J4530 Mild persistent asthma, uncomplicated: Secondary | ICD-10-CM | POA: Insufficient documentation

## 2020-08-25 MED ORDER — SPACER/AERO-HOLDING CHAMBERS DEVI: 11 refills | Status: DC

## 2020-08-25 NOTE — Progress Notes (Signed)
Stephanie Carroll, female    DOB: [birth year],    MRN: [id number]   Brief patient profile:  64 yowf never smoker retired from [employer] x decades referred to pulmonary clinic in [place]  08/25/2020 by Stephanie Kins PA for asthma first developed as a child but no memory of it and recurred in her 83s requiring freq pred but since started on asmanex 200 one bid feels it's been better controlled and main issue is access thru her insurance     History of Present Illness  08/25/2020  Pulmonary/ 1st office eval/ Stephanie Carroll / [place] [facility]  Chief Complaint  Patient presents with   Pulmonary Consult    Referred by Stephanie Kins, PA for eval of Asthma. Pt states she was dx with Asthma in the 1990's. She states doing well currently, and has not had any symptoms in the past 10-15 years since started taking Asmanex.   Dyspnea:  Not limited by breathing from desired activities  / walks 30 min daily in house more limited by feet than breathing Cough: none Sleep: bed flat / sev big pillows  SABA use: on exp to smoke / has levoalbuterol very rarely needs   No obvious other patterns in  day to day or daytime variability or assoc excess/ purulent sputum or mucus plugs or hemoptysis or cp or chest tightness, subjective wheeze or overt sinus or hb symptoms.   Sleeps most nights  without nocturnal  or early am exacerbation  of respiratory  c/o's or need for noct saba. Also denies any obvious fluctuation of symptoms with weather or environmental changes or other aggravating or alleviating factors except as outlined above   No unusual exposure hx or h/o childhood pna/ or knowledge of premature birth.  Current Allergies, Complete Past Medical History, Past Surgical History, Family History, and Social History were reviewed in [employer] record.  ROS  The following are not active complaints unless bolded Hoarseness, sore throat, dysphagia, dental problems, itching, sneezing,  nasal  congestion or discharge of excess mucus or purulent secretions, ear ache,   fever, chills, sweats, unintended wt loss or wt gain, classically pleuritic or exertional cp,  orthopnea pnd or arm/hand swelling  or leg swelling, presyncope, palpitations, abdominal pain, anorexia, nausea, vomiting, diarrhea  or change in bowel habits or change in bladder habits, change in stools or change in urine, dysuria, hematuria,  rash, arthralgias, visual complaints, headache, numbness, weakness or ataxia or problems with walking or coordination,  change in mood or  memory.           .  Past Medical History:  Diagnosis Date   Allergy    Arthritis    DDD   Asthma    Colon cancer ([place]) 2008   Diabetes mellitus    Elevated liver enzymes    GERD (gastroesophageal reflux disease)    Hyperlipidemia    Thyroid tumor, benign     Outpatient Medications Prior to Visit  Medication Sig Dispense Refill   albuterol (PROVENTIL HFA;VENTOLIN HFA) 108 (90 BASE) MCG/ACT inhaler Inhale 1 puff into the lungs every 6 (six) hours as needed. For shortness for breath     BLACK ELDERBERRY PO Take 1 tablet by mouth daily.     Cetirizine HCl 10 MG CAPS Take 10 mg by mouth as needed.     cholecalciferol (VITAMIN D) 25 MCG (1000 UT) tablet Take 1,000 Units by mouth daily.     EPINEPHrine 0.3 mg/0.3 mL IJ SOAJ injection  as needed.     guaiFENesin 200 MG tablet Take 400 mg by mouth 2 (two) times daily.     insulin glargine (LANTUS) 100 UNIT/ML injection Inject 60 Units into the skin daily.     LORazepam (ATIVAN) 0.5 MG tablet Take 0.5 mg by mouth 2 (two) times daily as needed.     metFORMIN (GLUCOPHAGE-XR) 500 MG 24 hr tablet Take 1,000 mg by mouth 2 (two) times daily.     mometasone (ASMANEX) 220 MCG/INH inhaler Inhale 1 puff into the lungs 2 (two) times daily.       Mometasone Furoate (ASMANEX HFA) 200 MCG/ACT AERO Inhale 1 puff into the lungs in the morning and at bedtime.     ondansetron (ZOFRAN ODT) 4 MG disintegrating tablet  Take 1 tablet (4 mg total) by mouth every 8 (eight) hours as needed for nausea or vomiting. 30 tablet 3   PROCTOSOL HC 2.5 % rectal cream PLACE 1 APPLICATION RECTALLY 2 (TWO) TIMES DAILY. (Patient taking differently: Place 1 application rectally as needed.) 30 g 1   RABEprazole (ACIPHEX) 20 MG tablet TAKE 1 TABLET BY MOUTH TWICE A DAY 60 tablet 3   sucralfate (CARAFATE) 1 g tablet TAKE 1 TABLET (1 G TOTAL) BY MOUTH 2 (TWO) TIMES DAILY. (Patient not taking: Reported on 08/25/2020) 60 tablet 0   ASMANEX HFA 200 MCG/ACT AERO SMARTSIG:2 Puff(s) By Mouth Twice Daily     losartan (COZAAR) 25 MG tablet Take 25 mg by mouth daily.     UNABLE TO FIND Med Name: CBD oil PO     No facility-administered medications prior to visit.     Objective:     BP 138/70 (BP Location: Left Arm, Cuff Size: Normal)   Pulse 82   Temp 97.8 F (36.6 C) (Oral)   Ht 5' 1"  (1.549 m)   Wt 143 lb 6.4 oz (65 kg)   SpO2 97% Comment: on RA  BMI 27.10 kg/m   SpO2: 97 % (on RA)  Pleasant amb wf nad   HEENT : pt wearing mask not removed for exam due to covid -19 concerns.    NECK :  without JVD/Nodes/TM/ nl carotid upstrokes bilaterally   LUNGS: no acc muscle use,  Nl contour chest which is clear to A and P bilaterally without cough on insp or exp maneuvers   CV:  RRR  no s3 or murmur or increase in P2, and no edema   ABD:  soft and nontender with nl inspiratory excursion in the supine position. No bruits or organomegaly appreciated, bowel sounds nl  MS:  Nl gait/ ext warm without deformities, calf tenderness, cyanosis or clubbing No obvious joint restrictions   SKIN: warm and dry without lesions    NEURO:  alert, approp, nl sensorium with  no motor or cerebellar deficits apparent.       Assessment   No problem-specific Assessment & Plan notes found for this encounter.     Christinia Gully, MD 08/25/2020

## 2020-08-25 NOTE — Assessment & Plan Note (Addendum)
Onset in childhood, recurred in her 21s  - allergy testing around 2000 in [place]   pine trees, grass, pollen  - 08/25/2020  After extensive coaching inhaler device,  effectiveness =   50 % with spacer (Ti too short/ poor coordination with trigger then breathe    All goals of chronic asthma control met including optimal function and elimination of symptoms with minimal need for rescue therapy.  Contingencies discussed in full including contacting this office immediately if not controlling the symptoms using the rule of two's.     Emphasized the delay between asmanex use and full benefit so if she does start needing more saba then immediately should start the full dose 200 2bid until better for a week   F/u can be in 87 m -sooner if needed           Each maintenance medication was reviewed in detail including emphasizing most importantly the difference between maintenance and prns and under what circumstances the prns are to be triggered using an action plan format where appropriate.  Total time for H and P, chart review, counseling, reviewing hfa/spacer device(s) and generating customized AVS unique to this office visit / same day charting =  45 min

## 2020-08-25 NOTE — Patient Instructions (Addendum)
Plan A = Automatic = Always=    Asmanex 200 1-2 first thing in am and repeat this in pm  (if worse symptoms use the full dose of 2 puffs every 12 hours for a full week)   We will send rx for new spacer to your pharmacy   Plan B = Backup (to supplement plan A, not to replace it) Only use your albuterol inhaler as a rescue medication to be used if you can't catch your breath by resting or doing a relaxed purse lip breathing pattern.  - The less you use it, the better it will work when you need it. - Ok to use the inhaler up to 2 puffs  every 4 hours if you must but call for appointment if use goes up over your usual need - Don't leave home without it !!  (think of it like the spare tire for your car)   Plan C = Crisis (instead of Plan B but only if Plan B stops working)  - only use your albuterol nebulizer if you first try Plan B and it fails to help > ok to use the nebulizer up to every 4 hours but if start needing it regularly call for immediate appointment   Please schedule a follow up visit in 12 months but call sooner if needed  with all medications /inhalers/ solutions in hand so we can verify exactly what you are taking. This includes all medications from all doctors and over the counters

## 2020-08-27 ENCOUNTER — Telehealth: Payer: Self-pay | Admitting: Internal Medicine

## 2020-08-27 NOTE — Telephone Encounter (Signed)
Called and spoke with patient who is wondering if we received the patient assistance paperwork from [facility]. Pt assistance program for Asmanex is the medication.  Please advise. Ok to leave message with information because they might be at an appointment for her husband.   Magda Paganini have you seen this paperwork?

## 2020-08-27 NOTE — Telephone Encounter (Signed)
I talked with MW about this yesterday and he was given the paper. It's just stating that asmanex no longer has pt assistance through the service she was using. Please advise alternative med, thanks

## 2020-08-28 NOTE — Telephone Encounter (Signed)
I called the number listed on the form "[facility]"- confirmed that they do not carry asmanex. Called Linus Salmons, Director with [facility] [employer] [facility] and had to [facility]- [phone].

## 2020-08-31 NOTE — Telephone Encounter (Deleted)
I called the pt and informed her that I have already lmtcb for [facility] and am awaiting her call back. Not sure what to do until then since I have been informed no assistance available for asmanex.

## 2020-09-01 NOTE — Telephone Encounter (Deleted)
Received form from [place] for asmanex assistance. Dr Melvyn Novas has signed and I placed in scan folder. Pt aware and nothing further needed.

## 2020-09-01 NOTE — Telephone Encounter (Signed)
Looking more at this form, it's for the asmanex 110. Pt is supposed to be on the 220 dose, and also the address for MW is wrong and form states it can't be altered. I called Linus Salmons again and left msg asking her to provide new form with correct address and strength of med. Will await her call back.

## 2020-09-02 ENCOUNTER — Telehealth: Payer: Self-pay | Admitting: Internal Medicine

## 2020-09-02 NOTE — Telephone Encounter (Signed)
See phone note dated 08/27/20  I tried calling her back and there was no answer and no option to leave msg

## 2020-09-02 NOTE — Telephone Encounter (Signed)
I spoke with the pt and notified that I am waiting on Linus Salmons to call me back regarding correct form. She states that would rather try for PA for asmanex hfa 200 at this point. I advised will call her pharm in the am to start process. Routing to myself for f/u.

## 2020-09-02 NOTE — Telephone Encounter (Signed)
Pt returning call.  [phone].  Pt available rest of the afternoon.

## 2020-09-03 ENCOUNTER — Other Ambulatory Visit: Payer: Self-pay | Admitting: Internal Medicine

## 2020-09-03 MED ORDER — ASMANEX HFA 200 MCG/ACT IN AERO
1.0000 | INHALATION_SPRAY | Freq: Two times a day (BID) | RESPIRATORY_TRACT | 11 refills | Status: DC
Start: 2020-09-03 — End: 2020-09-07

## 2020-09-03 NOTE — Telephone Encounter (Signed)
See if she can use dulera 200 Take 2 puffs first thing in am and then another 2 puffs about 12 hours later.   (It has the asmanex and an extra ingredient but that was she still gets same dose of asmanex )

## 2020-09-03 NOTE — Telephone Encounter (Signed)
Received form from the pharmacy regarding asmanex hfa- step therapy/alternate drug required- pt must try at least 2 of the following within a year- arnuity ellipta, flovent diskus or flovent hfa.   Please let us know what you want her to try. I checked med hx and see nothing but the asmanex

## 2020-09-03 NOTE — Telephone Encounter (Signed)
Dr. Melvyn Novas are you ok with requested change?

## 2020-09-03 NOTE — Telephone Encounter (Signed)
Called [facility] and requested PA form  She states that it is a step therapy that is needed  She is faxing info to [employer]  Will await form.

## 2020-09-04 ENCOUNTER — Telehealth: Payer: Self-pay | Admitting: Internal Medicine

## 2020-09-04 NOTE — Telephone Encounter (Signed)
Spoke with the pt and notified of response per MW  She does not want to try Dulera bc it's too expensive (she looked up on [facility])  She states she wants to pay out of pocket for the asmanex bc she knows it works for her and will not make her dm worse  Nothing further needed

## 2020-09-04 NOTE — Telephone Encounter (Signed)
Spoke with the pt  She states she spoke with her insurance and they said that all we need to do is call them [phone] and tell them that she has tried flovent and she has dm and that the med will then be approved I called the number she provided (spoke with Rollene Fare) and gave them this information as well as drug allergies including advair  She states info submitted and there is 48-72 hour turn around time  Will hold for approval/denial

## 2020-09-07 MED ORDER — ASMANEX HFA 200 MCG/ACT IN AERO: 1.0000 | INHALATION_SPRAY | Freq: Two times a day (BID) | RESPIRATORY_TRACT | 11 refills | Status: DC

## 2020-09-07 NOTE — Telephone Encounter (Signed)
Received fax from [facility] that the asmanex was approved until 09/03/21  Spoke with the pt  She states she was already made aware of approval  Nothing further needed

## 2020-09-09 ENCOUNTER — Telehealth: Payer: Self-pay | Admitting: Internal Medicine

## 2020-09-09 NOTE — Telephone Encounter (Signed)
Received fax  It's again from [facility], requesting asmanex twist haler-which is not what MW prescribed  We prescribed asmanex hfa, and already called and got PA approved for this  I called Lattie Haw and [facility] to see why she is sending this again

## 2020-09-09 NOTE — Telephone Encounter (Signed)
Faxed the form to the [facility] office as Dr. Melvyn Novas is not here for the next 2 weeks. Message sent to his nurse to be on the look out.   Magda Paganini please advise if you receive fax

## 2020-09-14 NOTE — Telephone Encounter (Signed)
Called and left another msg for lisa and will close encounter per protocol.

## 2020-09-15 ENCOUNTER — Encounter: Payer: Self-pay | Admitting: Hematology & Oncology

## 2020-09-15 ENCOUNTER — Inpatient Hospital Stay (HOSPITAL_BASED_OUTPATIENT_CLINIC_OR_DEPARTMENT_OTHER): Payer: 59 | Admitting: Hematology & Oncology

## 2020-09-15 ENCOUNTER — Inpatient Hospital Stay: Payer: 59 | Attending: Hematology & Oncology

## 2020-09-15 ENCOUNTER — Other Ambulatory Visit: Payer: Self-pay

## 2020-09-15 ENCOUNTER — Telehealth: Payer: Self-pay

## 2020-09-15 VITALS — BP 150/61 | HR 100 | Temp 97.8°F | Resp 18 | Wt 142.0 lb

## 2020-09-15 DIAGNOSIS — Z79899 Other long term (current) drug therapy: Secondary | ICD-10-CM | POA: Insufficient documentation

## 2020-09-15 DIAGNOSIS — E119 Type 2 diabetes mellitus without complications: Secondary | ICD-10-CM | POA: Diagnosis not present

## 2020-09-15 DIAGNOSIS — D500 Iron deficiency anemia secondary to blood loss (chronic): Secondary | ICD-10-CM | POA: Diagnosis not present

## 2020-09-15 DIAGNOSIS — J45909 Unspecified asthma, uncomplicated: Secondary | ICD-10-CM | POA: Diagnosis not present

## 2020-09-15 DIAGNOSIS — C187 Malignant neoplasm of sigmoid colon: Secondary | ICD-10-CM | POA: Insufficient documentation

## 2020-09-15 DIAGNOSIS — C182 Malignant neoplasm of ascending colon: Secondary | ICD-10-CM

## 2020-09-15 DIAGNOSIS — H538 Other visual disturbances: Secondary | ICD-10-CM | POA: Diagnosis not present

## 2020-09-15 DIAGNOSIS — K760 Fatty (change of) liver, not elsewhere classified: Secondary | ICD-10-CM | POA: Insufficient documentation

## 2020-09-15 LAB — CMP (CANCER CENTER ONLY)
ALT: 58 U/L — ABNORMAL HIGH (ref 0–44)
AST: 50 U/L — ABNORMAL HIGH (ref 15–41)
Albumin: 4.2 g/dL (ref 3.5–5.0)
Alkaline Phosphatase: 80 U/L (ref 38–126)
Anion gap: 10 (ref 5–15)
BUN: 10 mg/dL (ref 8–23)
CO2: 28 mmol/L (ref 22–32)
Calcium: 9.8 mg/dL (ref 8.9–10.3)
Chloride: 100 mmol/L (ref 98–111)
Creatinine: 0.66 mg/dL (ref 0.44–1.00)
GFR, Estimated: >60 mL/min (ref >60)
Glucose, Bld: 211 mg/dL — ABNORMAL HIGH (ref 70–99)
Potassium: 4 mmol/L (ref 3.5–5.1)
Sodium: 138 mmol/L (ref 135–145)
Total Bilirubin: 0.5 mg/dL (ref 0.3–1.2)
Total Protein: 7.4 g/dL (ref 6.5–8.1)

## 2020-09-15 LAB — CBC WITH DIFFERENTIAL (CANCER CENTER ONLY)
Abs Immature Granulocytes: 0.03 10*3/uL (ref 0.00–0.07)
Basophils Absolute: 0.1 10*3/uL (ref 0.0–0.1)
Basophils Relative: 1 %
Eosinophils Absolute: 0.3 10*3/uL (ref 0.0–0.5)
Eosinophils Relative: 3 %
HCT: 36.2 % (ref 36.0–46.0)
Hemoglobin: 11.6 g/dL — ABNORMAL LOW (ref 12.0–15.0)
Immature Granulocytes: 0 %
Lymphocytes Relative: 33 %
Lymphs Abs: 2.6 10*3/uL (ref 0.7–4.0)
MCH: 27.4 pg (ref 26.0–34.0)
MCHC: 32 g/dL (ref 30.0–36.0)
MCV: 85.4 fL (ref 80.0–100.0)
Monocytes Absolute: 0.6 10*3/uL (ref 0.1–1.0)
Monocytes Relative: 7 %
Neutro Abs: 4.6 10*3/uL (ref 1.7–7.7)
Neutrophils Relative %: 56 %
Platelet Count: 221 10*3/uL (ref 150–400)
RBC: 4.24 MIL/uL (ref 3.87–5.11)
RDW: 14.4 % (ref 11.5–15.5)
WBC Count: 8.1 10*3/uL (ref 4.0–10.5)
nRBC: 0 % (ref 0.0–0.2)

## 2020-09-15 NOTE — Progress Notes (Signed)
Hematology and Oncology Follow Up Visit  Stephanie Carroll [id number] 27-Dec-1955 65 y.o. 09/15/2020   Principle Diagnosis:  Stage II carcinoma of colon  Current Therapy:   Observation   Interim History:  Stephanie Carroll is here today for follow-up.  We see her every 6 months.  As always, it is her blood sugars that would be a real problem.  They are quite high today.  She recently was on steroids because of her asthma.  I know that the hot humid summer air has been bad for her.  She has an adopted son who is 72 years old.  He is playing football.  He started football practice at midnight.  She went to go watch.  Unfortunate, she got bitten by insects.  Thankfully, she has not had COVID.  She and her husband will be going to [facility] over [place].  She will have to be very careful when she goes down to [facility].  If she gets COVID, I think she will have a very hard time given her asthma.  She has had no issues with respect to her liver.  She does have a fatty liver for her diabetes.  Her last hemoglobin A1c was 9.3.  Her last CEA back in February was 1.52.  Overall, has been no change in bowel or bladder habits.  She has had no headache.  She has had no bleeding.  Her performance status is ECOG 1.    Medications:  Allergies as of 09/15/2020       Reactions   Fluticasone-salmeterol Other (See Comments), Palpitations   Morphine Other (See Comments), Palpitations   REACTION: heart racing   Peanut-containing Drug Products Cough   Penicillins Rash   REACTION: rash   Sulfa Antibiotics Other (See Comments)   Cyclobenzaprine Other (See Comments)   Loracarbef Other (See Comments)   Derm.   Metronidazole    Moxifloxacin Hcl In Nacl    Other    Oats and Green peppers---headaches   Peanuts [peanut Oil] Other (See Comments)   congested   Penicillin G Sodium Other (See Comments)   Derm.   Sulfamethazine    Sulfamethoxazole Nausea Only   Sulfonamide Derivatives    Ciprofloxacin  Anxiety, Other (See Comments)   Clindamycin Rash        Medication List        Accurate as of September 15, 2020 11:57 AM. If you have any questions, ask your nurse or doctor.          aerochamber plus with mask inhaler Use as directed   albuterol 108 (90 Base) MCG/ACT inhaler Commonly known as: VENTOLIN HFA Inhale 1 puff into the lungs every 6 (six) hours as needed. For shortness for breath   Asmanex HFA 200 MCG/ACT Aero Generic drug: Mometasone Furoate INHALE 1 PUFF INTO THE LUNGS IN THE MORNING AND AT BEDTIME. What changed: Another medication with the same name was removed. Continue taking this medication, and follow the directions you see here. Changed by: Volanda Napoleon, MD   BLACK ELDERBERRY PO Take 1 tablet by mouth daily.   Cetirizine HCl 10 MG Caps Take 10 mg by mouth as needed.   cholecalciferol 25 MCG (1000 UNIT) tablet Commonly known as: VITAMIN D Take 1,000 Units by mouth daily.   EPINEPHrine 0.3 mg/0.3 mL Soaj injection Commonly known as: EPI-PEN as needed.   guaiFENesin 200 MG tablet Take 400 mg by mouth 2 (two) times daily.   insulin glargine 100 UNIT/ML injection  Commonly known as: LANTUS Inject 60 Units into the skin daily.   LORazepam 0.5 MG tablet Commonly known as: ATIVAN Take 0.5 mg by mouth 2 (two) times daily as needed.   metFORMIN 500 MG 24 hr tablet Commonly known as: GLUCOPHAGE-XR Take 1,000 mg by mouth 2 (two) times daily.   ondansetron 4 MG disintegrating tablet Commonly known as: Zofran ODT Take 1 tablet (4 mg total) by mouth every 8 (eight) hours as needed for nausea or vomiting.   Proctosol HC 2.5 % rectal cream Generic drug: hydrocortisone PLACE 1 APPLICATION RECTALLY 2 (TWO) TIMES DAILY.   RABEprazole 20 MG tablet Commonly known as: ACIPHEX TAKE 1 TABLET BY MOUTH TWICE A DAY   sucralfate 1 g tablet Commonly known as: CARAFATE TAKE 1 TABLET (1 G TOTAL) BY MOUTH 2 (TWO) TIMES DAILY.        Allergies:   Allergies  Allergen Reactions   Fluticasone-Salmeterol Other (See Comments) and Palpitations   Morphine Other (See Comments) and Palpitations    REACTION: heart racing   Peanut-Containing Drug Products Cough   Penicillins Rash    REACTION: rash   Sulfa Antibiotics Other (See Comments)   Cyclobenzaprine Other (See Comments)   Loracarbef Other (See Comments)    Derm.    Metronidazole    Moxifloxacin Hcl In Nacl    Other     Oats and Green peppers---headaches   Peanuts [Peanut Oil] Other (See Comments)    congested   Penicillin G Sodium Other (See Comments)    Derm.   Sulfamethazine    Sulfamethoxazole Nausea Only   Sulfonamide Derivatives    Ciprofloxacin Anxiety and Other (See Comments)   Clindamycin Rash    Past Medical History, Surgical history, Social history, and Family History were reviewed and updated.  Review of Systems: Review of Systems  Constitutional: Negative.   HENT: Negative.    Eyes:  Positive for blurred vision.  Respiratory: Negative.    Cardiovascular: Negative.   Gastrointestinal: Negative.   Genitourinary: Negative.   Musculoskeletal: Negative.   Skin: Negative.   Neurological: Negative.   Endo/Heme/Allergies: Negative.   Psychiatric/Behavioral: Negative.       Physical Exam:  weight is 142 lb (64.4 kg). Her oral temperature is 97.8 F (36.6 C). Her blood pressure is 150/61 (abnormal) and her pulse is 100. Her respiration is 18 and oxygen saturation is 98%.   Wt Readings from Last 3 Encounters:  09/15/20 142 lb (64.4 kg)  08/25/20 143 lb 6.4 oz (65 kg)  03/29/20 143 lb (64.9 kg)    Physical Exam Vitals reviewed.  HENT:     Head: Normocephalic and atraumatic.  Eyes:     Pupils: Pupils are equal, round, and reactive to light.  Cardiovascular:     Rate and Rhythm: Normal rate and regular rhythm.     Heart sounds: Normal heart sounds.  Pulmonary:     Effort: Pulmonary effort is normal.     Breath sounds: Normal breath sounds.   Abdominal:     General: Bowel sounds are normal.     Palpations: Abdomen is soft.  Musculoskeletal:        General: No tenderness or deformity. Normal range of motion.     Cervical back: Normal range of motion.  Lymphadenopathy:     Cervical: No cervical adenopathy.  Skin:    General: Skin is warm and dry.     Findings: No erythema or rash.  Neurological:     Mental Status: She is alert  and oriented to person, place, and time.  Psychiatric:        Behavior: Behavior normal.        Thought Content: Thought content normal.        Judgment: Judgment normal.     Lab Results  Component Value Date   WBC 8.1 09/15/2020   HGB 11.6 (L) 09/15/2020   HCT 36.2 09/15/2020   MCV 85.4 09/15/2020   PLT 221 09/15/2020   Lab Results  Component Value Date   FERRITIN 26.3 06/19/2018   IRON 58 08/08/2008   IRONPCTSAT 13.3 (L) 08/08/2008   Lab Results  Component Value Date   RETICCTPCT 2.0 03/01/2007   RBC 4.24 09/15/2020   RETICCTABS 101.4 03/01/2007   No results found for: KPAFRELGTCHN, LAMBDASER, [facility] Lab Results  Component Value Date   IGA 371 06/19/2018   No results found for: Odetta Pink, SPEI   Chemistry      Component Value Date/Time   NA 138 09/15/2020 1010   NA 126 (L) 04/22/2016 1456   NA 141 11/29/2013 1454   K 4.0 09/15/2020 1010   K 4.1 04/22/2016 1456   K 3.8 11/29/2013 1454   CL 100 09/15/2020 1010   CL 95 (L) 04/22/2016 1456   CL 98 11/29/2013 1454   CO2 28 09/15/2020 1010   CO2 21 04/22/2016 1456   CO2 25 11/29/2013 1454   BUN 10 09/15/2020 1010   BUN 6 04/22/2016 1456   BUN 12 11/29/2013 1454   CREATININE 0.66 09/15/2020 1010   CREATININE 0.55 (L) 04/22/2016 1456   CREATININE 0.8 11/29/2013 1454      Component Value Date/Time   CALCIUM 9.8 09/15/2020 1010   CALCIUM 9.5 04/22/2016 1456   CALCIUM 9.2 11/29/2013 1454   ALKPHOS 80 09/15/2020 1010   ALKPHOS 103 04/22/2016 1456   ALKPHOS  93 (H) 11/29/2013 1454   AST 50 (H) 09/15/2020 1010   ALT 58 (H) 09/15/2020 1010   ALT 36 11/29/2013 1454   BILITOT 0.5 09/15/2020 1010      Impression and Plan: Stephanie Carroll is a very pleasant 9 yo caucasian female with history of 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 She did not require any adjuvant therapy.   She is doing fairly well.  I think she is holding her own.  Again, her blood sugars will be her biggest problem going forward into the future.  We will still see her back every 6 months.  I think this would be reasonable.   Volanda Napoleon, MD 8/2/202211:57 AM

## 2020-09-15 NOTE — Telephone Encounter (Signed)
Appts made and printed for pt per 09/15/20 los  Stephanie Carroll

## 2020-09-16 LAB — CEA (IN HOUSE-CHCC): CEA (CHCC-In House): 1.83 ng/mL (ref 0.00–5.00)

## 2020-09-17 ENCOUNTER — Encounter: Payer: Self-pay | Admitting: Hematology & Oncology

## 2020-09-17 DIAGNOSIS — D500 Iron deficiency anemia secondary to blood loss (chronic): Secondary | ICD-10-CM

## 2020-09-17 HISTORY — DX: Iron deficiency anemia secondary to blood loss (chronic): D50.0

## 2020-09-17 LAB — FERRITIN: Ferritin: 29 ng/mL (ref 11–307)

## 2020-09-17 LAB — IRON AND TIBC
Iron: 65 ug/dL (ref 41–142)
Saturation Ratios: 16 % — ABNORMAL LOW (ref 21–57)
TIBC: 413 ug/dL (ref 236–444)
UIBC: 348 ug/dL (ref 120–384)

## 2020-09-17 NOTE — Addendum Note (Signed)
Addended by: Burney Gauze R on: 09/17/2020 04:57 PM   Modules accepted: Orders

## 2020-09-18 ENCOUNTER — Telehealth: Payer: Self-pay

## 2020-09-18 NOTE — Telephone Encounter (Signed)
-----   Message from Volanda Napoleon, MD sent at 09/17/2020  4:54 PM EDT ----- Please call her and tell her that the iron is on the low side.  We probably need to do IV iron for her.  Please set her up.

## 2020-09-18 NOTE — Telephone Encounter (Signed)
Called and informed patient of lab results, patient is concerned because her iron was low when she had caner previously. Pt would like to know if this could be related. Per Dr Marin Olp- no, she is probably not absorbing iron due to the medication she takes for her diabetes. Checked previous colonoscopy date and results with no issues. Pt advised and states understanding.  Message sent to scheduling for appointment.

## 2020-09-23 ENCOUNTER — Other Ambulatory Visit: Payer: Self-pay

## 2020-09-23 ENCOUNTER — Inpatient Hospital Stay: Payer: 59

## 2020-09-23 VITALS — BP 149/85 | HR 98 | Temp 97.9°F | Resp 20

## 2020-09-23 DIAGNOSIS — C187 Malignant neoplasm of sigmoid colon: Secondary | ICD-10-CM | POA: Diagnosis not present

## 2020-09-23 DIAGNOSIS — K648 Other hemorrhoids: Secondary | ICD-10-CM

## 2020-09-23 DIAGNOSIS — K625 Hemorrhage of anus and rectum: Secondary | ICD-10-CM

## 2020-09-23 MED ORDER — SODIUM CHLORIDE 0.9 % IV SOLN
Freq: Once | INTRAVENOUS | Status: DC | PRN
  Filled 2020-09-23: qty 250

## 2020-09-23 MED ORDER — ACETAMINOPHEN 325 MG PO TABS
650.0000 mg | ORAL_TABLET | Freq: Once | ORAL | Status: AC
  Administered 2020-09-23: 650 mg via ORAL
  Filled 2020-09-23: qty 2

## 2020-09-23 MED ORDER — DIPHENHYDRAMINE HCL 50 MG/ML IJ SOLN: 50.0000 mg | Freq: Once | INTRAMUSCULAR | Status: DC | PRN

## 2020-09-23 MED ORDER — METHYLPREDNISOLONE SODIUM SUCC 125 MG IJ SOLR: 125.0000 mg | Freq: Once | INTRAMUSCULAR | Status: DC | PRN

## 2020-09-23 MED ORDER — EPINEPHRINE 0.3 MG/0.3ML IJ SOAJ: 0.3000 mg | Freq: Once | INTRAMUSCULAR | Status: DC | PRN

## 2020-09-23 MED ORDER — LORATADINE 10 MG PO TABS
10.0000 mg | ORAL_TABLET | Freq: Once | ORAL | Status: AC
  Administered 2020-09-23: 10 mg via ORAL
  Filled 2020-09-23: qty 1

## 2020-09-23 MED ORDER — FAMOTIDINE IN NACL 20-0.9 MG/50ML-% IV SOLN: 20.0000 mg | Freq: Once | INTRAVENOUS | Status: DC | PRN

## 2020-09-23 MED ORDER — ALBUTEROL SULFATE (2.5 MG/3ML) 0.083% IN NEBU
2.5000 mg | INHALATION_SOLUTION | Freq: Once | RESPIRATORY_TRACT | Status: DC | PRN
  Filled 2020-09-23: qty 3

## 2020-09-23 MED ORDER — SODIUM CHLORIDE 0.9 % IV SOLN
750.0000 mg | Freq: Once | INTRAVENOUS | Status: AC
  Administered 2020-09-23: 750 mg via INTRAVENOUS
  Filled 2020-09-23: qty 15

## 2020-09-23 MED ORDER — FAMOTIDINE IN NACL 20-0.9 MG/50ML-% IV SOLN: 20.0000 mg | Freq: Once | INTRAVENOUS | Status: DC

## 2020-09-23 MED ORDER — SODIUM CHLORIDE 0.9 % IV SOLN
Freq: Once | INTRAVENOUS | Status: AC
  Filled 2020-09-23: qty 250

## 2020-09-23 MED ORDER — FAMOTIDINE 20 MG IN NS 100 ML IVPB
20.0000 mg | Freq: Once | INTRAVENOUS | Status: AC
  Administered 2020-09-23: 20 mg via INTRAVENOUS
  Filled 2020-09-23: qty 20

## 2020-09-23 NOTE — Patient Instructions (Signed)
Ferric carboxymaltose injection What is this medication? FERRIC CARBOXYMALTOSE (FER ik car BOX ee MAWL tose) treats anemia in people with chronic kidney disease or people who cannot take iron by mouth. It works by increasing iron in your body which helps red blood cells deliver oxygen fromthe lungs to cells all over the body. This medicine may be used for other purposes; ask your health care provider orpharmacist if you have questions. COMMON BRAND NAME(S): Injectafer What should I tell my care team before I take this medication? They need to know if you have any of these conditions: High blood pressure High levels of iron in the blood An unusual or allergic reaction to iron, other medications, foods, dyes, or preservatives Pregnant or trying to get pregnant Breast-feeding How should I use this medication? This medication is injected into a vein. It is given by your care team in [place] or clinic setting. Talk to your care team about the use of this medication in children. While it may be given to children as young as 1 year for selected conditions,precautions do apply. Overdosage: If you think you have taken too much of this medicine contact apoison control center or emergency room at once. NOTE: This medicine is only for you. Do not share this medicine with others. What if I miss a dose? Keep appointments for follow-up doses. It is important not to miss your dose.Call your care team if you are unable to keep an appointment. What may interact with this medication? Do not take this medication with any of the following medications: Deferoxamine Dimercaprol Other iron products This list may not describe all possible interactions. Give your health care provider a list of all the medicines, herbs, non-prescription drugs, or dietary supplements you use. Also tell them if you smoke, drink alcohol, or use illegaldrugs. Some items may interact with your medicine. What should I watch for while  using this medication? Visit your care team for regular checks on your progress. Tell your care teamif your symptoms do not start to get better or if they get worse. You may need blood work while you are taking this medication. You may need to eat more foods that contain iron. Talk to your care team. Foods that contain iron include whole grains/cereals, dried fruits, beans, or peas,leafy green vegetables, and organ meats (liver, kidney). What side effects may I notice from receiving this medication? Side effects that you should report to your care team as soon as possible: Allergic reactions-skin rash, itching, hives, swelling of the face, lips, tongue, or throat Dizziness Facial flushing, redness Increase in blood pressure Low phosphorous levels-loss of appetite, muscle weakness Side effects that usually do not require medical attention (report to your careteam if they continue or are bothersome): Change in taste Constipation Headache Nausea Pain, redness, or irritation at injection site Vomiting This list may not describe all possible side effects. Call your doctor for medical advice about side effects. You may report side effects to FDA at1-800-FDA-1088. Where should I keep my medication? This medication is given in a hospital or clinic. It will not be stored at home. NOTE: This sheet is a summary. It may not cover all possible information. If you have questions about this medicine, talk to your doctor, pharmacist, orhealth care provider.  2022 Elsevier/Gold Standard (2020-02-28 12:26:19)

## 2020-09-24 ENCOUNTER — Telehealth: Payer: Self-pay | Admitting: *Deleted

## 2020-09-24 NOTE — Telephone Encounter (Signed)
Received a call from Patient concerned that she had to come in for IV iron and concerned that meant her cancer was back.  Relayed this to Dr Marin Olp who asked when her last colonscopy was done which was 2/21.  Dr Marin Olp suggested if patient was concerned that she could get stool cards and check her stool for occult blood and if positive she can let us know and we will go from there.  Patient appreciative of call and will let us know.

## 2020-10-23 DIAGNOSIS — E1159 Type 2 diabetes mellitus with other circulatory complications: Secondary | ICD-10-CM | POA: Insufficient documentation

## 2020-10-23 DIAGNOSIS — I152 Hypertension secondary to endocrine disorders: Secondary | ICD-10-CM | POA: Insufficient documentation

## 2020-11-20 ENCOUNTER — Other Ambulatory Visit: Payer: Self-pay

## 2020-11-20 ENCOUNTER — Telehealth: Payer: Self-pay | Admitting: Gastroenterology

## 2020-11-20 DIAGNOSIS — K219 Gastro-esophageal reflux disease without esophagitis: Secondary | ICD-10-CM

## 2020-11-20 DIAGNOSIS — R194 Change in bowel habit: Secondary | ICD-10-CM

## 2020-11-20 DIAGNOSIS — Z85038 Personal history of other malignant neoplasm of large intestine: Secondary | ICD-10-CM

## 2020-11-20 MED ORDER — ONDANSETRON 4 MG PO TBDP: ORAL_TABLET | ORAL | 0 refills | Status: DC

## 2020-11-20 NOTE — Telephone Encounter (Signed)
Please order CBC, and CMP.  We can schedule her for direct EGD and colonoscopy with RN pre visit earlier if available. Thanks

## 2020-11-20 NOTE — Telephone Encounter (Signed)
Inbound call from pt requesting a call back stating that she has a change in her bm. Please advise. Thank you.

## 2020-11-20 NOTE — Telephone Encounter (Signed)
Spoke with the patient and explained the plan of care. She is agreeable to this. She will come for labs and pick up her instructions. No pre-visits available. Patient states she is comfortable with the prep if we use the same on as last year. Asks for the Zofran again as well. Pharmacy is [facility] in [place].

## 2020-11-20 NOTE — Telephone Encounter (Signed)
Spoke with the patient. Hx of colon cancer. She reports dark green to charcoal colored stool for about 1 month, but not every day. She does not note any frank blood.  She is having breakthrough indigestion. She is treating this with Pepcid, and sometimes heat to her chest, and Mylanta. She had an IV iron infusion in August. She takes Elderberry supplement, oral B12 and vitamin D. She is not on oral iron and have not taken bismuth. She is past due her follow up and is presently scheduled to see you 12/23/20. Is there anything she should do now?

## 2020-11-23 ENCOUNTER — Other Ambulatory Visit (INDEPENDENT_AMBULATORY_CARE_PROVIDER_SITE_OTHER): Payer: 59

## 2020-11-23 DIAGNOSIS — Z85038 Personal history of other malignant neoplasm of large intestine: Secondary | ICD-10-CM

## 2020-11-23 DIAGNOSIS — K219 Gastro-esophageal reflux disease without esophagitis: Secondary | ICD-10-CM | POA: Diagnosis not present

## 2020-11-23 DIAGNOSIS — R194 Change in bowel habit: Secondary | ICD-10-CM

## 2020-11-23 LAB — CBC WITH DIFFERENTIAL/PLATELET
Basophils Absolute: 0 10*3/uL (ref 0.0–0.1)
Basophils Relative: 0.5 % (ref 0.0–3.0)
Eosinophils Absolute: 0.3 10*3/uL (ref 0.0–0.7)
Eosinophils Relative: 2.8 % (ref 0.0–5.0)
HCT: 40.3 % (ref 36.0–46.0)
Hemoglobin: 13.6 g/dL (ref 12.0–15.0)
Lymphocytes Relative: 26.6 % (ref 12.0–46.0)
Lymphs Abs: 2.5 10*3/uL (ref 0.7–4.0)
MCHC: 33.8 g/dL (ref 30.0–36.0)
MCV: 88.1 fl (ref 78.0–100.0)
Monocytes Absolute: 0.6 10*3/uL (ref 0.1–1.0)
Monocytes Relative: 5.9 % (ref 3.0–12.0)
Neutro Abs: 6 10*3/uL (ref 1.4–7.7)
Neutrophils Relative %: 64.2 % (ref 43.0–77.0)
Platelets: 231 10*3/uL (ref 150.0–400.0)
RBC: 4.57 Mil/uL (ref 3.87–5.11)
RDW: 16.4 % — ABNORMAL HIGH (ref 11.5–15.5)
WBC: 9.3 10*3/uL (ref 4.0–10.5)

## 2020-11-23 LAB — COMPREHENSIVE METABOLIC PANEL
ALT: 45 U/L — ABNORMAL HIGH (ref 0–35)
AST: 38 U/L — ABNORMAL HIGH (ref 0–37)
Albumin: 4.5 g/dL (ref 3.5–5.2)
Alkaline Phosphatase: 73 U/L (ref 39–117)
BUN: 8 mg/dL (ref 6–23)
CO2: 26 mEq/L (ref 19–32)
Calcium: 9.8 mg/dL (ref 8.4–10.5)
Chloride: 103 mEq/L (ref 96–112)
Creatinine, Ser: 0.61 mg/dL (ref 0.40–1.20)
GFR: 95.18 mL/min (ref >60.00)
Glucose, Bld: 113 mg/dL — ABNORMAL HIGH (ref 70–99)
Potassium: 4 mEq/L (ref 3.5–5.1)
Sodium: 140 mEq/L (ref 135–145)
Total Bilirubin: 0.4 mg/dL (ref 0.2–1.2)
Total Protein: 7.8 g/dL (ref 6.0–8.3)

## 2020-12-01 ENCOUNTER — Encounter: Payer: Self-pay | Admitting: Hematology & Oncology

## 2020-12-02 ENCOUNTER — Other Ambulatory Visit: Payer: Self-pay | Admitting: Gastroenterology

## 2020-12-09 ENCOUNTER — Encounter: Payer: Self-pay | Admitting: Gastroenterology

## 2020-12-09 ENCOUNTER — Ambulatory Visit (AMBULATORY_SURGERY_CENTER): Payer: 59 | Admitting: Gastroenterology

## 2020-12-09 VITALS — BP 148/65 | HR 94 | Temp 98.7°F | Resp 12 | Ht 61.0 in | Wt 142.0 lb

## 2020-12-09 DIAGNOSIS — K621 Rectal polyp: Secondary | ICD-10-CM

## 2020-12-09 DIAGNOSIS — K2970 Gastritis, unspecified, without bleeding: Secondary | ICD-10-CM

## 2020-12-09 DIAGNOSIS — D128 Benign neoplasm of rectum: Secondary | ICD-10-CM

## 2020-12-09 DIAGNOSIS — R1010 Upper abdominal pain, unspecified: Secondary | ICD-10-CM

## 2020-12-09 DIAGNOSIS — K2950 Unspecified chronic gastritis without bleeding: Secondary | ICD-10-CM | POA: Diagnosis not present

## 2020-12-09 DIAGNOSIS — D500 Iron deficiency anemia secondary to blood loss (chronic): Secondary | ICD-10-CM | POA: Diagnosis not present

## 2020-12-09 DIAGNOSIS — D124 Benign neoplasm of descending colon: Secondary | ICD-10-CM

## 2020-12-09 DIAGNOSIS — K5730 Diverticulosis of large intestine without perforation or abscess without bleeding: Secondary | ICD-10-CM | POA: Diagnosis not present

## 2020-12-09 DIAGNOSIS — K317 Polyp of stomach and duodenum: Secondary | ICD-10-CM | POA: Diagnosis not present

## 2020-12-09 DIAGNOSIS — K635 Polyp of colon: Secondary | ICD-10-CM

## 2020-12-09 DIAGNOSIS — Z85038 Personal history of other malignant neoplasm of large intestine: Secondary | ICD-10-CM

## 2020-12-09 DIAGNOSIS — K219 Gastro-esophageal reflux disease without esophagitis: Secondary | ICD-10-CM

## 2020-12-09 MED ORDER — SODIUM CHLORIDE 0.9 % IV SOLN
500.0000 mL | Freq: Once | INTRAVENOUS | Status: DC
Start: 2020-12-09 — End: 2021-02-24

## 2020-12-09 NOTE — Progress Notes (Signed)
Called to room to assist during endoscopic procedure.  Patient ID and intended procedure confirmed with present staff. Received instructions for my participation in the procedure from the performing physician.  

## 2020-12-09 NOTE — Progress Notes (Signed)
VS by [facility]  Pt's states no medical or surgical changes since previsit or office visit.

## 2020-12-09 NOTE — Op Note (Addendum)
[facility] Patient Name: Stephanie Carroll Procedure Date: 12/09/2020 2:58 PM MRN: [id number] Endoscopist: Mauri Pole , MD Age: 65 Referring MD:  Date of Birth: 1955/09/06 Gender: Female Account #: [id number] Procedure:                Upper GI endoscopy Indications:              Gastrointestinal bleeding of unknown origin,                            Suspected upper gastrointestinal bleeding in                            patient with unexplained iron deficiency anemia Medicines:                Monitored Anesthesia Care Procedure:                Pre-Anesthesia Assessment:                           - Prior to the procedure, a History and Physical                            was performed, and patient medications and                            allergies were reviewed. The patient's tolerance of                            previous anesthesia was also reviewed. The risks                            and benefits of the procedure and the sedation                            options and risks were discussed with the patient.                            All questions were answered, and informed consent                            was obtained. Prior Anticoagulants: The patient has                            taken no previous anticoagulant or antiplatelet                            agents. ASA Grade Assessment: III - A patient with                            severe systemic disease. After reviewing the risks                            and benefits, the patient was deemed in  satisfactory condition to undergo the procedure.                           After obtaining informed consent, the endoscope was                            passed under direct vision. Throughout the                            procedure, the patient's blood pressure, pulse, and                            oxygen saturations were monitored continuously. The                            GIF [id number]  #[id number] was introduced through the                            mouth, and advanced to the second part of duodenum.                            The upper GI endoscopy was accomplished without                            difficulty. The patient tolerated the procedure                            well. Scope In: Scope Out: Findings:                 The Z-line was regular and was found 35 cm from the                            incisors.                           No gross lesions were noted in the entire esophagus.                           A single 20 mm mucosal papule (nodule) with no                            bleeding and stigmata of recent bleeding was found                            in the gastric antrum. Biopsies were taken with a                            cold forceps for histology.                           Patchy moderate inflammation characterized by                            congestion (edema), erosions and erythema  was found                            in the entire examined stomach. Biopsies were taken                            with a cold forceps for Helicobacter pylori testing.                           The examined duodenum was normal. Complications:            No immediate complications. Estimated Blood Loss:     Estimated blood loss was minimal. Impression:               - Z-line regular, 35 cm from the incisors.                           - No gross lesions in esophagus.                           - A single mucosal papule (nodule) found in the                            stomach. Biopsied.                           - Gastritis. Biopsied.                           - Normal examined duodenum. Recommendation:           - Patient has a contact number available for                            emergencies. The signs and symptoms of potential                            delayed complications were discussed with the                            patient. Return to normal activities  tomorrow.                            Written discharge instructions were provided to the                            patient.                           - Resume previous diet.                           - Continue present medications.                           - Await pathology results.                           -  No ibuprofen, naproxen, or other non-steroidal                            anti-inflammatory drugs.                           - Based on patholopgy results, plan for repeat egd                            with resection on gastric nodule/polypoid lesion,                            to be scheduled at [place], MD 12/09/2020 3:36:04 PM This report has been signed electronically.

## 2020-12-09 NOTE — Progress Notes (Signed)
[facility] History and Physical   Primary Care Physician:  Ninfa Meeker, PA-C   Reason for Procedure:  Iron deficiency anemia, h/o colon cancer and polyps  Plan:    EGD and colonoscopy with possible interventions as needed     HPI: Stephanie Carroll is a very pleasant 65 y.o. female here for EGD and colonsocopy. Denies any nausea, vomiting, melena or bright red blood per rectum  She has upper abdominal pain and intermittent dark stool Colon ca in 2008 s/p resection. Last CEA within normal range. She is followed by Oncology.  The risks and benefits as well as alternatives of endoscopic procedure(s) have been discussed and reviewed. All questions answered. The patient agrees to proceed.    Past Medical History:  Diagnosis Date   Allergy    Arthritis    DDD   Asthma    Colon cancer ([place]) 2008   Diabetes mellitus    Elevated liver enzymes    GERD (gastroesophageal reflux disease)    Hyperlipidemia    Iron deficiency anemia due to chronic blood loss 09/17/2020   Thyroid tumor, benign     Past Surgical History:  Procedure Laterality Date   BIOPSY THYROID     X2   CESAREAN SECTION     x 1   COLON SURGERY     FOOT FRACTURE SURGERY Left 02/2015   INCISIONAL HERNIA REPAIR     NASAL SEPTUM SURGERY     PARTIAL HYSTERECTOMY     POLYPECTOMY     UPPER GASTROINTESTINAL ENDOSCOPY     WRIST GANGLION EXCISION     left    Prior to Admission medications   Medication Sig Start Date End Date Taking? Authorizing Provider  ASMANEX HFA 200 MCG/ACT AERO INHALE 1 PUFF INTO THE LUNGS IN THE MORNING AND AT BEDTIME. 09/08/20  Yes Tanda Rockers, MD  BLACK ELDERBERRY PO Take 1 tablet by mouth daily.   Yes [provider]  Cetirizine HCl 10 MG CAPS Take 10 mg by mouth as needed. 04/15/18  Yes [provider]  cholecalciferol (VITAMIN D) 25 MCG (1000 UT) tablet Take 1,000 Units by mouth daily. 02/21/18  Yes [provider]  guaiFENesin 200 MG tablet Take 400  mg by mouth 2 (two) times daily.   Yes [provider]  insulin glargine (LANTUS) 100 UNIT/ML injection Inject 60 Units into the skin daily.   Yes [provider]  LORazepam (ATIVAN) 0.5 MG tablet Take 0.5 mg by mouth 2 (two) times daily as needed. 07/12/19  Yes [provider]  metFORMIN (GLUCOPHAGE-XR) 500 MG 24 hr tablet Take 1,000 mg by mouth 2 (two) times daily.   Yes [provider]  ondansetron (ZOFRAN ODT) 4 MG disintegrating tablet Take 1 tablet every 8 hours as needed for nausea during prep 11/20/20  Yes Donni Oglesby, Venia Minks, MD  RABEprazole (ACIPHEX) 20 MG tablet TAKE 1 TABLET BY MOUTH TWICE A DAY 12/02/20  Yes Sue Fernicola, Venia Minks, MD  Spacer/Aero-Holding Chambers (AEROCHAMBER PLUS WITH MASK) inhaler Use as directed 08/25/20  Yes Tanda Rockers, MD  albuterol (PROVENTIL HFA;VENTOLIN HFA) 108 (90 BASE) MCG/ACT inhaler Inhale 1 puff into the lungs every 6 (six) hours as needed. For shortness for breath Patient not taking: No sig reported    [provider]  EPINEPHrine 0.3 mg/0.3 mL IJ SOAJ injection as needed. Patient not taking: No sig reported    [provider]  PROCTOSOL HC 2.5 % rectal cream PLACE 1 APPLICATION RECTALLY  2 (TWO) TIMES DAILY. Patient not taking: No sig reported 06/24/16   Mauri Pole, MD  sucralfate (CARAFATE) 1 g tablet TAKE 1 TABLET (1 G TOTAL) BY MOUTH 2 (TWO) TIMES DAILY. 03/14/19   Mauri Pole, MD    Current Outpatient Medications  Medication Sig Dispense Refill   ASMANEX HFA 200 MCG/ACT AERO INHALE 1 PUFF INTO THE LUNGS IN THE MORNING AND AT BEDTIME. 13 each 11   BLACK ELDERBERRY PO Take 1 tablet by mouth daily.     Cetirizine HCl 10 MG CAPS Take 10 mg by mouth as needed.     cholecalciferol (VITAMIN D) 25 MCG (1000 UT) tablet Take 1,000 Units by mouth daily.     guaiFENesin 200 MG tablet Take 400 mg by mouth 2 (two) times daily.     insulin glargine (LANTUS) 100 UNIT/ML injection Inject 60  Units into the skin daily.     LORazepam (ATIVAN) 0.5 MG tablet Take 0.5 mg by mouth 2 (two) times daily as needed.     metFORMIN (GLUCOPHAGE-XR) 500 MG 24 hr tablet Take 1,000 mg by mouth 2 (two) times daily.     ondansetron (ZOFRAN ODT) 4 MG disintegrating tablet Take 1 tablet every 8 hours as needed for nausea during prep 5 tablet 0   RABEprazole (ACIPHEX) 20 MG tablet TAKE 1 TABLET BY MOUTH TWICE A DAY 60 tablet 3   Spacer/Aero-Holding Chambers (AEROCHAMBER PLUS WITH MASK) inhaler Use as directed 1 each 11   albuterol (PROVENTIL HFA;VENTOLIN HFA) 108 (90 BASE) MCG/ACT inhaler Inhale 1 puff into the lungs every 6 (six) hours as needed. For shortness for breath (Patient not taking: No sig reported)     EPINEPHrine 0.3 mg/0.3 mL IJ SOAJ injection as needed. (Patient not taking: No sig reported)     PROCTOSOL HC 2.5 % rectal cream PLACE 1 APPLICATION RECTALLY 2 (TWO) TIMES DAILY. (Patient not taking: No sig reported) 30 g 1   sucralfate (CARAFATE) 1 g tablet TAKE 1 TABLET (1 G TOTAL) BY MOUTH 2 (TWO) TIMES DAILY. 60 tablet 0   Current Facility-Administered Medications  Medication Dose Route Frequency Provider Last Rate Last Admin   0.9 %  sodium chloride infusion  500 mL Intravenous Once Mauri Pole, MD        Allergies as of 12/09/2020 - Review Complete 12/09/2020  Allergen Reaction Noted   Fluticasone-salmeterol Other (See Comments) and Palpitations 09/28/2007   Morphine Other (See Comments) and Palpitations 09/28/2007   Peanut-containing drug products Cough 07/15/2010   Penicillins Rash 09/28/2007   Sulfa antibiotics Other (See Comments)    Cyclobenzaprine Other (See Comments) 03/23/2015   Loracarbef Other (See Comments) 07/05/2010   Metronidazole  08/27/2009   Moxifloxacin hcl in nacl  07/05/2010   Other  10/14/2011   Peanuts [peanut oil] Other (See Comments) 10/14/2011   Penicillin g sodium Other (See Comments) 10/27/2011   Sulfamethazine  07/05/2010   Sulfamethoxazole  Nausea Only 03/23/2015   Sulfonamide derivatives     Ciprofloxacin Anxiety and Other (See Comments) 03/23/2015   Clindamycin Rash 07/31/2015    Family History  Problem Relation Age of Onset   COPD Other        both sides   Diabetes Other        both sides   Heart disease Father    Kidney failure Father    Lung cancer Paternal Uncle    Leukemia Paternal Grandfather    Cervical cancer Paternal Aunt  x 2    Heart defect Mother        MVP   Breast cancer Mother    Colon cancer Neg Hx    Esophageal cancer Neg Hx    Rectal cancer Neg Hx    Stomach cancer Neg Hx     Social History   Socioeconomic History   Marital status: Married    Spouse name: Not on file   Number of children: 2   Years of education: Not on file   Highest education level: Not on file  Occupational History   Occupation: claims for [employer]    Employer: [employer]  Tobacco Use   Smoking status: Never   Smokeless tobacco: Never   Tobacco comments:    never used product  Vaping Use   Vaping Use: Never used  Substance and Sexual Activity   Alcohol use: No    Alcohol/week: 0.0 standard drinks   Drug use: No   Sexual activity: Not on file  Other Topics Concern   Not on file  Social History Narrative   Not on file   Social Determinants of Health   Financial Resource Strain: Not on file  Food Insecurity: Not on file  Transportation Needs: Not on file  Physical Activity: Not on file  Stress: Not on file  Social Connections: Not on file  Intimate Partner Violence: Not on file    Review of Systems:  All other review of systems negative except as mentioned in the HPI.  Physical Exam: Vital signs in last 24 hours: @VSRANGES @   General:   Alert, NAD Lungs:  Clear .   Heart:  Regular rate and rhythm Abdomen:  Soft, nontender and nondistended. Neuro/Psych:  Alert and cooperative. Normal mood and affect. A and O x 3  Reviewed labs, radiology imaging, old records and pertinent past GI work  up  Patient is appropriate for planned procedure(s) and anesthesia in an ambulatory setting   K. Denzil Magnuson , MD [phone]

## 2020-12-09 NOTE — Patient Instructions (Signed)
YOU HAD AN ENDOSCOPIC PROCEDURE TODAY AT [facility]:   Refer to the procedure report that was given to you for any specific questions about what was found during the examination.  If the procedure report does not answer your questions, please call your gastroenterologist to clarify.  If you requested that your care partner not be given the details of your procedure findings, then the procedure report has been included in a sealed envelope for you to review at your convenience later.  YOU SHOULD EXPECT: Some feelings of bloating in the abdomen. Passage of more gas than usual.  Walking can help get rid of the air that was put into your GI tract during the procedure and reduce the bloating. If you had a lower endoscopy (such as a colonoscopy or flexible sigmoidoscopy) you may notice spotting of blood in your stool or on the toilet paper. If you underwent a bowel prep for your procedure, you may not have a normal bowel movement for a few days.  Please Note:  You might notice some irritation and congestion in your nose or some drainage.  This is from the oxygen used during your procedure.  There is no need for concern and it should clear up in a day or so.  SYMPTOMS TO REPORT IMMEDIATELY:  Following lower endoscopy (colonoscopy or flexible sigmoidoscopy):  Excessive amounts of blood in the stool  Significant tenderness or worsening of abdominal pains  Swelling of the abdomen that is new, acute  Fever of 100F or higher  Following upper endoscopy (EGD)  Vomiting of blood or coffee ground material  New chest pain or pain under the shoulder blades  Painful or persistently difficult swallowing  New shortness of breath  Fever of 100F or higher  Black, tarry-looking stools  For urgent or emergent issues, a gastroenterologist can be reached at any hour by calling [phone]. Do not use MyChart messaging for urgent concerns.    DIET:  We do recommend a small meal at first, but  then you may proceed to your regular diet.  Drink plenty of fluids but you should avoid alcoholic beverages for 24 hours.  ACTIVITY:  You should plan to take it easy for the rest of today and you should NOT DRIVE or use heavy machinery until tomorrow (because of the sedation medicines used during the test).    FOLLOW UP: Our staff will call the number listed on your records 48-72 hours following your procedure to check on you and address any questions or concerns that you may have regarding the information given to you following your procedure. If we do not reach you, we will leave a message.  We will attempt to reach you two times.  During this call, we will ask if you have developed any symptoms of COVID 19. If you develop any symptoms (ie: fever, flu-like symptoms, shortness of breath, cough etc.) before then, please call [phone].  If you test positive for Covid 19 in the 2 weeks post procedure, please call and report this information to [place].    If any biopsies were taken you will be contacted by phone or by letter within the next 1-3 weeks.  Please call us at [phone] if you have not heard about the biopsies in 3 weeks.    SIGNATURES/CONFIDENTIALITY: You and/or your care partner have signed paperwork which will be entered into your electronic medical record.  These signatures attest to the fact that that the information above on your After  Visit Summary has been reviewed and is understood.  Full responsibility of the confidentiality of this discharge information lies with you and/or your care-partner.    NO IBUPROFEN,NAPROXEN,OR OTHER NON-STEROIDAL ANTI-INFLAMMATORY DRUGS. RESUME REMAINDER OF MEDICATIONS.OFFICE WILL SCHEDULE PROCEDURE FOR [facility]. INFORMATION GIVEN ON GASTRITIS,POLYPS,DIVERTICULOSIS AND HEMORRHOIDS.

## 2020-12-09 NOTE — Op Note (Signed)
[place] Patient Name: Stephanie Carroll Procedure Date: 12/09/2020 2:57 PM MRN: [id number] Endoscopist: Mauri Pole , MD Age: 65 Referring MD:  Date of Birth: 10-May-1955 Gender: Female Account #: [id number] Procedure:                Colonoscopy Indications:              Unexplained iron deficiency anemia Medicines:                Monitored Anesthesia Care Procedure:                Pre-Anesthesia Assessment:                           - Prior to the procedure, a History and Physical                            was performed, and patient medications and                            allergies were reviewed. The patient's tolerance of                            previous anesthesia was also reviewed. The risks                            and benefits of the procedure and the sedation                            options and risks were discussed with the patient.                            All questions were answered, and informed consent                            was obtained. Prior Anticoagulants: The patient has                            taken no previous anticoagulant or antiplatelet                            agents. ASA Grade Assessment: III - A patient with                            severe systemic disease. After reviewing the risks                            and benefits, the patient was deemed in                            satisfactory condition to undergo the procedure.                           After obtaining informed consent, the colonoscope  was passed under direct vision. Throughout the                            procedure, the patient's blood pressure, pulse, and                            oxygen saturations were monitored continuously. The                            Olympus PCF-H190DL (#[id number]) Colonoscope was                            introduced through the anus and advanced to the the                            ileocolonic  anastomosis. The colonoscopy was                            performed without difficulty. The patient tolerated                            the procedure well. The quality of the bowel                            preparation was good. The terminal ileum and the                            rectum were photographed. Scope In: 3:18:46 PM Scope Out: 3:29:24 PM Scope Withdrawal Time: 0 hours 8 minutes 23 seconds  Total Procedure Duration: 0 hours 10 minutes 38 seconds  Findings:                 The perianal and digital rectal examinations were                            normal.                           A less than 1 mm polyp was found in the descending                            colon. The polyp was sessile. The polyp was removed                            with a cold biopsy forceps. Resection and retrieval                            were complete.                           A few small-mouthed diverticula were found in the                            sigmoid colon.  There was evidence of a prior end-to-side                            ileo-colonic anastomosis in the transverse colon.                            This was patent and was characterized by healthy                            appearing mucosa.                           A 5 mm polyp was found in the rectum. The polyp was                            sessile. The polyp was removed with a cold snare.                            Resection and retrieval were complete.                           Non-bleeding internal hemorrhoids were found during                            retroflexion. The hemorrhoids were medium-sized. Complications:            No immediate complications. Estimated Blood Loss:     Estimated blood loss was minimal. Impression:               - One less than 1 mm polyp in the descending colon,                            removed with a cold biopsy forceps. Resected and                             retrieved.                           - Diverticulosis in the sigmoid colon.                           - Patent end-to-side ileo-colonic anastomosis,                            characterized by healthy appearing mucosa.                           - One 5 mm polyp in the rectum, removed with a cold                            snare. Resected and retrieved.                           - Non-bleeding internal hemorrhoids. Recommendation:           - Patient has a  contact number available for                            emergencies. The signs and symptoms of potential                            delayed complications were discussed with the                            patient. Return to normal activities tomorrow.                            Written discharge instructions were provided to the                            patient.                           - Resume previous diet.                           - Continue present medications.                           - Await pathology results.                           - Repeat colonoscopy in 5 years for surveillance                            based on pathology results. Mauri Pole, MD 12/09/2020 3:39:49 PM This report has been signed electronically.

## 2020-12-09 NOTE — Progress Notes (Signed)
A and O x3. Report to RN. Tolerated MAC anesthesia well.Teeth unchanged after procedure. 

## 2020-12-11 ENCOUNTER — Telehealth: Payer: Self-pay

## 2020-12-11 NOTE — Telephone Encounter (Signed)
  Follow up Call-  Call back number 12/09/2020 03/19/2019  Post procedure Call Back phone  # [phone]  Permission to leave phone message Yes Yes  Some recent data might be hidden     Patient questions:  Do you have a fever, pain , or abdominal swelling? Yes.   Pain Score  2 *   Pt states abdomen was "sore" yesterday, more than in the past after procedures, states pain level "2" today and feeling better, pt aware to call with any worsening of pain or any other concern, verb understanding.   Have you tolerated food without any problems? Yes.    Have you been able to return to your normal activities? Yes.    Do you have any questions about your discharge instructions: Diet   No. Medications  No. Follow up visit  No.  Do you have questions or concerns about your Care? No.  Actions: * If pain score is 4 or above: No action needed, pain <4.  Have you developed a fever since your procedure? no  2.   Have you had an respiratory symptoms (SOB or cough) since your procedure? no  3.   Have you tested positive for COVID 19 since your procedure no  4.   Have you had any family members/close contacts diagnosed with the COVID 19 since your procedure?  no   If yes to any of these questions please route to Joylene John, RN and Joella Prince, RN

## 2020-12-22 ENCOUNTER — Telehealth: Payer: Self-pay

## 2020-12-22 ENCOUNTER — Other Ambulatory Visit: Payer: Self-pay

## 2020-12-22 ENCOUNTER — Encounter: Payer: Self-pay | Admitting: Gastroenterology

## 2020-12-22 DIAGNOSIS — D500 Iron deficiency anemia secondary to blood loss (chronic): Secondary | ICD-10-CM

## 2020-12-22 NOTE — Telephone Encounter (Signed)
Patient wants to know if she still needs to come in for her office visit that was scheduled for tomorrow. Please advise.

## 2020-12-22 NOTE — Telephone Encounter (Signed)
Given CBC is improving, we can reschedule office visit in 2 months.  We will hold off repeat EGD for gastric polyp removal.  Recheck CBC and iron panel in 2 months.

## 2020-12-22 NOTE — Telephone Encounter (Signed)
Called patient and left a message on her voice mail that we are cancelling her office visit for tomorrow. Also that we will plan to check labs and reschedule an office visit with Dr. Silverio Decamp the first of the year (2 months). Order for labs in [place] and staff reminder made

## 2020-12-23 ENCOUNTER — Ambulatory Visit: Payer: 59 | Admitting: Gastroenterology

## 2021-02-11 ENCOUNTER — Encounter: Payer: Self-pay | Admitting: Hematology & Oncology

## 2021-02-16 ENCOUNTER — Encounter: Payer: Self-pay | Admitting: Emergency Medicine

## 2021-02-16 ENCOUNTER — Encounter: Payer: Self-pay | Admitting: Hematology & Oncology

## 2021-02-16 ENCOUNTER — Ambulatory Visit
Admission: EM | Admit: 2021-02-16 | Discharge: 2021-02-16 | Disposition: A | Discharge status: Home / Self Care | Payer: 59 | Attending: Urgent Care | Admitting: Urgent Care

## 2021-02-16 ENCOUNTER — Ambulatory Visit (INDEPENDENT_AMBULATORY_CARE_PROVIDER_SITE_OTHER): Payer: 59

## 2021-02-16 ENCOUNTER — Other Ambulatory Visit: Payer: Self-pay

## 2021-02-16 ENCOUNTER — Telehealth: Payer: Self-pay

## 2021-02-16 DIAGNOSIS — R093 Abnormal sputum: Secondary | ICD-10-CM

## 2021-02-16 DIAGNOSIS — R059 Cough, unspecified: Secondary | ICD-10-CM

## 2021-02-16 DIAGNOSIS — E119 Type 2 diabetes mellitus without complications: Secondary | ICD-10-CM

## 2021-02-16 DIAGNOSIS — Z794 Long term (current) use of insulin: Secondary | ICD-10-CM

## 2021-02-16 DIAGNOSIS — R052 Subacute cough: Secondary | ICD-10-CM

## 2021-02-16 DIAGNOSIS — Z20828 Contact with and (suspected) exposure to other viral communicable diseases: Secondary | ICD-10-CM | POA: Diagnosis not present

## 2021-02-16 DIAGNOSIS — B349 Viral infection, unspecified: Secondary | ICD-10-CM

## 2021-02-16 DIAGNOSIS — R062 Wheezing: Secondary | ICD-10-CM

## 2021-02-16 MED ORDER — BENZONATATE 100 MG PO CAPS: 100.0000 mg | ORAL_CAPSULE | Freq: Three times a day (TID) | ORAL | 0 refills | Status: DC | PRN

## 2021-02-16 MED ORDER — PROMETHAZINE-DM 6.25-15 MG/5ML PO SYRP: 5.0000 mL | ORAL_SOLUTION | Freq: Every evening | ORAL | 0 refills | Status: DC | PRN

## 2021-02-16 MED ORDER — OSELTAMIVIR PHOSPHATE 75 MG PO CAPS: 75.0000 mg | ORAL_CAPSULE | Freq: Two times a day (BID) | ORAL | 0 refills | Status: DC

## 2021-02-16 NOTE — Telephone Encounter (Signed)
Left a message for the patient to call back. She needs to be told she is due for labs. The order is in [place]. Schedule the follow up visit.

## 2021-02-16 NOTE — ED Triage Notes (Signed)
Productive cough with yellow sputum and congestion since yesterday.

## 2021-02-16 NOTE — ED Provider Notes (Signed)
[place]   MRN: [id number] DOB: [birth year]  Subjective:   Stephanie Carroll is a 66 y.o. female presenting for 2-day history of acute onset sinus congestion, productive cough, chest tightness and wheezing.  Patient has a history of chronic asthma.  She has been using her breathing treatments.  Wants to make sure that she does not have bronchitis or pneumonia in her lungs.  No chest pain or shortness of breath.  Patient did have close exposure to influenza.  She has a type II diabetic, last A1c was 9.4% in August 2022.  She is treated with insulin.  Has longstanding history of tachycardia and is followed by cardiology.  They have avoided the use of beta-blockers due to interactions with her asthma medications.   Current Facility-Administered Medications:    0.9 %  sodium chloride infusion, 500 mL, Intravenous, Once, Nandigam, Kavitha V, MD  Current Outpatient Medications:    albuterol (PROVENTIL HFA;VENTOLIN HFA) 108 (90 BASE) MCG/ACT inhaler, Inhale 1 puff into the lungs every 6 (six) hours as needed. For shortness for breath (Patient not taking: No sig reported), Disp: , Rfl:    ASMANEX HFA 200 MCG/ACT AERO, INHALE 1 PUFF INTO THE LUNGS IN THE MORNING AND AT BEDTIME., Disp: 13 each, Rfl: 11   BLACK ELDERBERRY PO, Take 1 tablet by mouth daily., Disp: , Rfl:    Cetirizine HCl 10 MG CAPS, Take 10 mg by mouth as needed., Disp: , Rfl:    cholecalciferol (VITAMIN D) 25 MCG (1000 UT) tablet, Take 1,000 Units by mouth daily., Disp: , Rfl:    EPINEPHrine 0.3 mg/0.3 mL IJ SOAJ injection, as needed. (Patient not taking: No sig reported), Disp: , Rfl:    guaiFENesin 200 MG tablet, Take 400 mg by mouth 2 (two) times daily., Disp: , Rfl:    insulin glargine (LANTUS) 100 UNIT/ML injection, Inject 60 Units into the skin daily., Disp: , Rfl:    LORazepam (ATIVAN) 0.5 MG tablet, Take 0.5 mg by mouth 2 (two) times daily as needed., Disp: , Rfl:    metFORMIN (GLUCOPHAGE-XR) 500 MG 24 hr tablet,  Take 1,000 mg by mouth 2 (two) times daily., Disp: , Rfl:    ondansetron (ZOFRAN ODT) 4 MG disintegrating tablet, Take 1 tablet every 8 hours as needed for nausea during prep, Disp: 5 tablet, Rfl: 0   PROCTOSOL HC 2.5 % rectal cream, PLACE 1 APPLICATION RECTALLY 2 (TWO) TIMES DAILY. (Patient not taking: No sig reported), Disp: 30 g, Rfl: 1   RABEprazole (ACIPHEX) 20 MG tablet, TAKE 1 TABLET BY MOUTH TWICE A DAY, Disp: 60 tablet, Rfl: 3   Spacer/Aero-Holding Chambers (AEROCHAMBER PLUS WITH MASK) inhaler, Use as directed, Disp: 1 each, Rfl: 11   sucralfate (CARAFATE) 1 g tablet, TAKE 1 TABLET (1 G TOTAL) BY MOUTH 2 (TWO) TIMES DAILY., Disp: 60 tablet, Rfl: 0   Allergies  Allergen Reactions   Fluticasone-Salmeterol Other (See Comments) and Palpitations   Morphine Other (See Comments) and Palpitations    REACTION: heart racing   Peanut-Containing Drug Products Cough   Penicillins Rash    REACTION: rash   Sulfa Antibiotics Other (See Comments)   Cyclobenzaprine Other (See Comments)   Loracarbef Other (See Comments)    Derm.    Metronidazole    Moxifloxacin Hcl In Nacl    Other     Oats and Green peppers---headaches   Peanuts [Peanut Oil] Other (See Comments)    congested   Penicillin G Sodium Other (See Comments)  Derm.   Sulfamethazine    Sulfamethoxazole Nausea Only   Sulfonamide Derivatives    Ciprofloxacin Anxiety and Other (See Comments)   Clindamycin Rash    Past Medical History:  Diagnosis Date   Allergy    Arthritis    DDD   Asthma    Colon cancer ([place]) 2008   Diabetes mellitus    Elevated liver enzymes    GERD (gastroesophageal reflux disease)    Hyperlipidemia    Iron deficiency anemia due to chronic blood loss 09/17/2020   Thyroid tumor, benign      Past Surgical History:  Procedure Laterality Date   BIOPSY THYROID     X2   CESAREAN SECTION     x 1   COLON SURGERY     FOOT FRACTURE SURGERY Left 02/2015   INCISIONAL HERNIA REPAIR     NASAL SEPTUM SURGERY      PARTIAL HYSTERECTOMY     POLYPECTOMY     UPPER GASTROINTESTINAL ENDOSCOPY     WRIST GANGLION EXCISION     left    Family History  Problem Relation Age of Onset   COPD Other        both sides   Diabetes Other        both sides   Heart disease Father    Kidney failure Father    Lung cancer Paternal Uncle    Leukemia Paternal Grandfather    Cervical cancer Paternal Aunt        x 2    Heart defect Mother        MVP   Breast cancer Mother    Colon cancer Neg Hx    Esophageal cancer Neg Hx    Rectal cancer Neg Hx    Stomach cancer Neg Hx     Social History   Tobacco Use   Smoking status: Never   Smokeless tobacco: Never   Tobacco comments:    never used product  Vaping Use   Vaping Use: Never used  Substance Use Topics   Alcohol use: No    Alcohol/week: 0.0 standard drinks   Drug use: No    ROS   Objective:   Vitals: BP (!) 189/93 (BP Location: Right Arm)    Pulse (!) 125    Temp 97.7 F (36.5 C) (Oral)    Resp 18    SpO2 96%   Physical Exam Constitutional:      General: She is not in acute distress.    Appearance: Normal appearance. She is well-developed. She is not ill-appearing, toxic-appearing or diaphoretic.  HENT:     Head: Normocephalic and atraumatic.     Nose: Nose normal.     Mouth/Throat:     Mouth: Mucous membranes are moist.  Eyes:     Extraocular Movements: Extraocular movements intact.     Pupils: Pupils are equal, round, and reactive to light.  Cardiovascular:     Rate and Rhythm: Regular rhythm. Tachycardia present.     Pulses: Normal pulses.     Heart sounds: Normal heart sounds. No murmur heard.   No friction rub. No gallop.  Pulmonary:     Effort: Pulmonary effort is normal. No respiratory distress.     Breath sounds: Normal breath sounds. No stridor. No wheezing, rhonchi or rales.  Skin:    General: Skin is warm and dry.     Findings: No rash.  Neurological:     Mental Status: She is alert and oriented to person, place,  and time.  Psychiatric:        Mood and Affect: Mood normal.        Behavior: Behavior normal.        Thought Content: Thought content normal.    DG Chest 2 View  Result Date: 02/16/2021 CLINICAL DATA:  Productive cough with yellow sputum and congestion since yesterday. EXAM: CHEST - 2 VIEW COMPARISON:  03/29/2020 FINDINGS: Chronic coarse pulmonary interstitial markings. No focal infiltrate or overt edema. Heart size and mediastinal contours are within normal limits. No effusion. Visualized bones unremarkable. IMPRESSION: No acute cardiopulmonary disease. Electronically Signed   By: Lucrezia Europe M.D.   On: 02/16/2021 11:53     Assessment and Plan :   PDMP not reviewed this encounter.  1. Acute viral syndrome   2. Exposure to the flu   3. Subacute cough   4. Wheezing   5. Type 2 diabetes mellitus treated with insulin ([place])     Will cover for influenza with Tamiflu given exposure, symptom set, current incidence in the community.  Use supportive care, rest, fluids, hydration, light meals, schedule Tylenol and ibuprofen. Counseled patient on potential for adverse effects with medications prescribed today, patient verbalized understanding. ER and return-to-clinic precautions discussed, patient verbalized understanding.    Jaynee Eagles, [place] 02/16/21 1156

## 2021-02-16 NOTE — Discharge Instructions (Addendum)
We will notify you of your test results as they arrive and may take between 48-72 hours.  I encourage you to sign up for MyChart if you have not already done so as this can be the easiest way for [place] to communicate results to you online or through a phone app.  Generally, we only contact you if it is a positive test result.  In the meantime, if you develop worsening symptoms including fever, chest pain, shortness of breath despite our current treatment plan then please report to the emergency room as this may be a sign of worsening status from possible viral infection.  Otherwise, we will manage this as a viral syndrome like influenza with Tamiflu. For sore throat or cough try using a honey-based tea. Use 3 teaspoons of honey with juice squeezed from half lemon. Place shaved pieces of ginger into 1/2-1 cup of water and warm over stove top. Then mix the ingredients and repeat every 4 hours as needed. Please take Tylenol 585m-650mg every 6 hours for aches and pains, fevers. Hydrate very well with at least 2 liters of water. Eat light meals such as soups to replenish electrolytes and soft fruits, veggies. Start an antihistamine like Zyrtec for postnasal drainage, sinus congestion.  Maintain your breathing treatments consistently for your asthma.

## 2021-02-16 NOTE — Telephone Encounter (Signed)
-----   Message from Hughie Closs, RN sent at 12/22/2020  4:34 PM EST ----- Pt. Needs 2 month F/U labs = 02/23/20. Order in [place]. Also needs O.V. after labs with Dr. Silverio Decamp

## 2021-02-17 LAB — COVID-19, FLU A+B NAA
Influenza A, NAA: NOT DETECTED
Influenza B, NAA: NOT DETECTED
SARS-CoV-2, NAA: DETECTED — AB

## 2021-02-18 ENCOUNTER — Telehealth: Payer: Self-pay | Admitting: Emergency Medicine

## 2021-02-18 DIAGNOSIS — U071 COVID-19: Secondary | ICD-10-CM

## 2021-02-18 MED ORDER — MOLNUPIRAVIR EUA 200MG CAPSULE: 4.0000 | ORAL_CAPSULE | Freq: Two times a day (BID) | ORAL | 0 refills | Status: AC

## 2021-02-18 NOTE — Telephone Encounter (Signed)
Pt called in regards to her positive Covid results. She wanted to know if anti-virals could be called into her pharmacy. Per Merrie Roof PA, molnupiravir sent to [facility]

## 2021-02-19 ENCOUNTER — Other Ambulatory Visit: Payer: Self-pay

## 2021-02-19 ENCOUNTER — Ambulatory Visit
Admission: EM | Admit: 2021-02-19 | Discharge: 2021-02-19 | Disposition: A | Discharge status: Home / Self Care | Payer: 59 | Attending: Urgent Care | Admitting: Urgent Care

## 2021-02-19 DIAGNOSIS — E119 Type 2 diabetes mellitus without complications: Secondary | ICD-10-CM | POA: Diagnosis not present

## 2021-02-19 DIAGNOSIS — J4540 Moderate persistent asthma, uncomplicated: Secondary | ICD-10-CM

## 2021-02-19 DIAGNOSIS — U071 COVID-19: Secondary | ICD-10-CM

## 2021-02-19 DIAGNOSIS — Z794 Long term (current) use of insulin: Secondary | ICD-10-CM | POA: Diagnosis not present

## 2021-02-19 MED ORDER — PAXLOVID (300/100) 20 X 150 MG & 10 X 100MG PO TBPK: 1.0000 | ORAL_TABLET | Freq: Two times a day (BID) | ORAL | 0 refills | Status: DC

## 2021-02-19 MED ORDER — PREDNISONE 20 MG PO TABS: ORAL_TABLET | ORAL | 0 refills | Status: DC

## 2021-02-19 NOTE — Telephone Encounter (Signed)
Message to the patient through My Chart.

## 2021-02-19 NOTE — ED Provider Notes (Signed)
[facility]   MRN: [id number] DOB: 1955/06/01  Subjective:   Stephanie Carroll is a 66 y.o. female presenting for getting prescription for Paxlovid.  Patient tested positive for this on 02/16/2021.  She is on day 4/5 of her illness.  No chest pain, shortness of breath or wheezing.  She is a type II diabetic on insulin and her blood sugars have been good.  She is also had asthma and is very consistent with her breathing treatments.  Was initially prescribed molnupiravir but would like Paxlovid instead.   Current Facility-Administered Medications:    0.9 %  sodium chloride infusion, 500 mL, Intravenous, Once, Nandigam, Kavitha V, MD  Current Outpatient Medications:    albuterol (PROVENTIL HFA;VENTOLIN HFA) 108 (90 BASE) MCG/ACT inhaler, Inhale 1 puff into the lungs every 6 (six) hours as needed. For shortness for breath (Patient not taking: No sig reported), Disp: , Rfl:    ASMANEX HFA 200 MCG/ACT AERO, INHALE 1 PUFF INTO THE LUNGS IN THE MORNING AND AT BEDTIME., Disp: 13 each, Rfl: 11   benzonatate (TESSALON) 100 MG capsule, Take 1-2 capsules (100-200 mg total) by mouth 3 (three) times daily as needed for cough., Disp: 60 capsule, Rfl: 0   BLACK ELDERBERRY PO, Take 1 tablet by mouth daily., Disp: , Rfl:    Cetirizine HCl 10 MG CAPS, Take 10 mg by mouth as needed., Disp: , Rfl:    cholecalciferol (VITAMIN D) 25 MCG (1000 UT) tablet, Take 1,000 Units by mouth daily., Disp: , Rfl:    EPINEPHrine 0.3 mg/0.3 mL IJ SOAJ injection, as needed. (Patient not taking: No sig reported), Disp: , Rfl:    guaiFENesin 200 MG tablet, Take 400 mg by mouth 2 (two) times daily., Disp: , Rfl:    insulin glargine (LANTUS) 100 UNIT/ML injection, Inject 60 Units into the skin daily., Disp: , Rfl:    LORazepam (ATIVAN) 0.5 MG tablet, Take 0.5 mg by mouth 2 (two) times daily as needed., Disp: , Rfl:    metFORMIN (GLUCOPHAGE-XR) 500 MG 24 hr tablet, Take 1,000 mg by mouth 2 (two) times daily., Disp: , Rfl:     molnupiravir EUA (LAGEVRIO) 200 mg CAPS capsule, Take 4 capsules (800 mg total) by mouth 2 (two) times daily for 5 days., Disp: 40 capsule, Rfl: 0   ondansetron (ZOFRAN ODT) 4 MG disintegrating tablet, Take 1 tablet every 8 hours as needed for nausea during prep, Disp: 5 tablet, Rfl: 0   oseltamivir (TAMIFLU) 75 MG capsule, Take 1 capsule (75 mg total) by mouth 2 (two) times daily., Disp: 10 capsule, Rfl: 0   PROCTOSOL HC 2.5 % rectal cream, PLACE 1 APPLICATION RECTALLY 2 (TWO) TIMES DAILY. (Patient not taking: No sig reported), Disp: 30 g, Rfl: 1   promethazine-dextromethorphan (PROMETHAZINE-DM) 6.25-15 MG/5ML syrup, Take 5 mLs by mouth at bedtime as needed for cough., Disp: 100 mL, Rfl: 0   RABEprazole (ACIPHEX) 20 MG tablet, TAKE 1 TABLET BY MOUTH TWICE A DAY, Disp: 60 tablet, Rfl: 3   Spacer/Aero-Holding Chambers (AEROCHAMBER PLUS WITH MASK) inhaler, Use as directed, Disp: 1 each, Rfl: 11   sucralfate (CARAFATE) 1 g tablet, TAKE 1 TABLET (1 G TOTAL) BY MOUTH 2 (TWO) TIMES DAILY., Disp: 60 tablet, Rfl: 0   Allergies  Allergen Reactions   Fluticasone-Salmeterol Other (See Comments) and Palpitations   Morphine Other (See Comments) and Palpitations    REACTION: heart racing   Peanut-Containing Drug Products Cough   Penicillins Rash    REACTION: rash  Sulfa Antibiotics Other (See Comments)   Cyclobenzaprine Other (See Comments)   Loracarbef Other (See Comments)    Derm.    Metronidazole    Moxifloxacin Hcl In Nacl    Other     Oats and Green peppers---headaches   Peanuts [Peanut Oil] Other (See Comments)    congested   Penicillin G Sodium Other (See Comments)    Derm.   Sulfamethazine    Sulfamethoxazole Nausea Only   Sulfonamide Derivatives    Ciprofloxacin Anxiety and Other (See Comments)   Clindamycin Rash    Past Medical History:  Diagnosis Date   Allergy    Arthritis    DDD   Asthma    Colon cancer ([place]) 2008   Diabetes mellitus    Elevated liver enzymes    GERD  (gastroesophageal reflux disease)    Hyperlipidemia    Iron deficiency anemia due to chronic blood loss 09/17/2020   Thyroid tumor, benign      Past Surgical History:  Procedure Laterality Date   BIOPSY THYROID     X2   CESAREAN SECTION     x 1   COLON SURGERY     FOOT FRACTURE SURGERY Left 02/2015   INCISIONAL HERNIA REPAIR     NASAL SEPTUM SURGERY     PARTIAL HYSTERECTOMY     POLYPECTOMY     UPPER GASTROINTESTINAL ENDOSCOPY     WRIST GANGLION EXCISION     left    Family History  Problem Relation Age of Onset   COPD Other        both sides   Diabetes Other        both sides   Heart disease Father    Kidney failure Father    Lung cancer Paternal Uncle    Leukemia Paternal Grandfather    Cervical cancer Paternal Aunt        x 2    Heart defect Mother        MVP   Breast cancer Mother    Colon cancer Neg Hx    Esophageal cancer Neg Hx    Rectal cancer Neg Hx    Stomach cancer Neg Hx     Social History   Tobacco Use   Smoking status: Never   Smokeless tobacco: Never   Tobacco comments:    never used product  Vaping Use   Vaping Use: Never used  Substance Use Topics   Alcohol use: No    Alcohol/week: 0.0 standard drinks   Drug use: No    ROS   Objective:   Vitals: BP (!) 152/82 (BP Location: Right Arm)    Pulse (!) 104    Temp 98 F (36.7 C) (Oral)    Resp 18    SpO2 97%   Physical Exam Constitutional:      General: She is not in acute distress.    Appearance: Normal appearance. She is well-developed. She is not ill-appearing, toxic-appearing or diaphoretic.  HENT:     Head: Normocephalic and atraumatic.     Nose: Nose normal.     Mouth/Throat:     Mouth: Mucous membranes are moist.  Eyes:     Extraocular Movements: Extraocular movements intact.     Pupils: Pupils are equal, round, and reactive to light.  Cardiovascular:     Rate and Rhythm: Regular rhythm. Tachycardia present.     Pulses: Normal pulses.     Heart sounds: Normal heart  sounds. No murmur heard.   No friction  rub. No gallop.     Comments: Borderline tachycardia. Pulmonary:     Effort: Pulmonary effort is normal. No respiratory distress.     Breath sounds: Normal breath sounds. No stridor. No wheezing, rhonchi or rales.  Skin:    General: Skin is warm and dry.     Findings: No rash.  Neurological:     Mental Status: She is alert and oriented to person, place, and time.  Psychiatric:        Mood and Affect: Mood normal.        Behavior: Behavior normal.        Thought Content: Thought content normal.   Recent Results (from the past 2160 hour(s))  Comprehensive metabolic panel     Status: Abnormal   Collection Time: 11/23/20 12:38 PM  Result Value Ref Range   Sodium 140 135 - 145 mEq/L   Potassium 4.0 3.5 - 5.1 mEq/L   Chloride 103 96 - 112 mEq/L   CO2 26 19 - 32 mEq/L   Glucose, Bld 113 (H) 70 - 99 mg/dL   BUN 8 6 - 23 mg/dL   Creatinine, Ser 0.61 0.40 - 1.20 mg/dL   Total Bilirubin 0.4 0.2 - 1.2 mg/dL   Alkaline Phosphatase 73 39 - 117 U/L   AST 38 (H) 0 - 37 U/L   ALT 45 (H) 0 - 35 U/L   Total Protein 7.8 6.0 - 8.3 g/dL   Albumin 4.5 3.5 - 5.2 g/dL   GFR 95.18 >60.00 mL/min    Comment: Calculated using the CKD-EPI Creatinine Equation (2021)   Calcium 9.8 8.4 - 10.5 mg/dL  CBC with Differential/Platelet     Status: Abnormal   Collection Time: 11/23/20 12:38 PM  Result Value Ref Range   WBC 9.3 4.0 - 10.5 K/uL   RBC 4.57 3.87 - 5.11 Mil/uL   Hemoglobin 13.6 12.0 - 15.0 g/dL   HCT 40.3 36.0 - 46.0 %   MCV 88.1 78.0 - 100.0 fl   MCHC 33.8 30.0 - 36.0 g/dL   RDW 16.4 (H) 11.5 - 15.5 %   Platelets 231.0 150.0 - 400.0 K/uL   Neutrophils Relative % 64.2 43.0 - 77.0 %   Lymphocytes Relative 26.6 12.0 - 46.0 %   Monocytes Relative 5.9 3.0 - 12.0 %   Eosinophils Relative 2.8 0.0 - 5.0 %   Basophils Relative 0.5 0.0 - 3.0 %   Neutro Abs 6.0 1.4 - 7.7 K/uL   Lymphs Abs 2.5 0.7 - 4.0 K/uL   Monocytes Absolute 0.6 0.1 - 1.0 K/uL   Eosinophils  Absolute 0.3 0.0 - 0.7 K/uL   Basophils Absolute 0.0 0.0 - 0.1 K/uL  Covid-19, Flu A+B ([facility])     Status: Abnormal   Collection Time: 02/16/21 11:27 AM   Specimen: Nasopharyngeal   Naso  Result Value Ref Range   SARS-CoV-2, NAA Detected (A) Not Detected    Comment: Patients who have a positive COVID-19 test result may now have treatment options. Treatment options are available for patients with mild to moderate symptoms and for hospitalized patients. Visit our website at [email or web address] for resources and information.    Influenza A, NAA Not Detected Not Detected   Influenza B, NAA Not Detected Not Detected   Test Information: Comment     Comment: This nucleic acid amplification test was developed and its performance characteristics determined by [employer]. Nucleic acid amplification tests include RT-PCR and TMA. This test has not been FDA cleared or approved. This test  has been authorized by FDA under an Emergency Use Authorization (EUA). This test is only authorized for the duration of time the declaration that circumstances exist justifying the authorization of the emergency use of in vitro diagnostic tests for detection of SARS-CoV-2 virus and/or diagnosis of COVID-19 infection under section 564(b)(1) of the Act, 21 U.S.C. 034VQQ-5(Z) (1), unless the authorization is terminated or revoked sooner. When diagnostic testing is negative, the possibility of a false negative result should be considered in the context of a patient's recent exposures and the presence of clinical signs and symptoms consistent with COVID-19. An individual without symptoms of COVID-19 and who is not shedding SARS-CoV-2 virus wo uld expect to have a negative (not detected) result in this assay.     Assessment and Plan :   PDMP not reviewed this encounter.  1. COVID-19   2. Type 2 diabetes mellitus treated with insulin ([place])   3. Moderate persistent asthma without  complication     Start Paxlovid.  Patient should not have any medication interactions.  We used a normal dosing without kidney disease. Deferred imaging given clear cardiopulmonary exam, hemodynamically stable vital signs.  She did have a chest x-ray last visit and was normal. Counseled patient on potential for adverse effects with medications prescribed/recommended today, ER and return-to-clinic precautions discussed, patient verbalized understanding.    Jaynee Eagles, [place] 02/19/21 [phone]

## 2021-02-19 NOTE — ED Triage Notes (Signed)
Patient presents to Urgent Care for a change in prescription medication. She tested positive for covid and wants provider to send in prescription for Paxlovid. Continues to have cough and congestion.   Denies fever.

## 2021-02-24 ENCOUNTER — Ambulatory Visit: Payer: 59 | Admitting: Family Medicine

## 2021-02-24 ENCOUNTER — Encounter: Payer: Self-pay | Admitting: Family Medicine

## 2021-02-24 VITALS — BP 145/87 | HR 106 | Temp 98.3°F | Ht 61.0 in | Wt 136.0 lb

## 2021-02-24 DIAGNOSIS — F411 Generalized anxiety disorder: Secondary | ICD-10-CM

## 2021-02-24 DIAGNOSIS — E785 Hyperlipidemia, unspecified: Secondary | ICD-10-CM | POA: Diagnosis not present

## 2021-02-24 DIAGNOSIS — E1159 Type 2 diabetes mellitus with other circulatory complications: Secondary | ICD-10-CM

## 2021-02-24 DIAGNOSIS — E1169 Type 2 diabetes mellitus with other specified complication: Secondary | ICD-10-CM

## 2021-02-24 DIAGNOSIS — E119 Type 2 diabetes mellitus without complications: Secondary | ICD-10-CM

## 2021-02-24 DIAGNOSIS — I152 Hypertension secondary to endocrine disorders: Secondary | ICD-10-CM

## 2021-02-24 DIAGNOSIS — E11319 Type 2 diabetes mellitus with unspecified diabetic retinopathy without macular edema: Secondary | ICD-10-CM | POA: Insufficient documentation

## 2021-02-24 DIAGNOSIS — E538 Deficiency of other specified B group vitamins: Secondary | ICD-10-CM | POA: Insufficient documentation

## 2021-02-24 DIAGNOSIS — Z794 Long term (current) use of insulin: Secondary | ICD-10-CM

## 2021-02-24 MED ORDER — HYDROXYZINE PAMOATE 25 MG PO CAPS: 25.0000 mg | ORAL_CAPSULE | Freq: Three times a day (TID) | ORAL | 0 refills | Status: DC | PRN

## 2021-02-24 NOTE — Progress Notes (Signed)
Subjective:  Patient ID: Stephanie Carroll, female    DOB: 10-13-1955, 66 y.o.   MRN: [id number]  Patient Care Team: Baruch Gouty, FNP as PCP - General (Family Medicine)   Chief Complaint:  New Patient (Initial Visit)   HPI: Stephanie Carroll is a 66 y.o. female presenting on 02/24/2021 for New Patient (Initial Visit)  Patient presents today to establish care with new PCP.  She has switched primary care provider several times due to insurance.  She has an extensive medical history.  She is followed by endocrinology, oncology, and GI on a regular basis.  She has uncontrolled type 2 diabetes with associated hypertension, hyperlipidemia, retinopathy, and obesity.  Recent labs with endocrinology revealed a hemoglobin A1c of 9.4.  She also has generalized anxiety, GERD, prior history of colon cancer, iron deficiency anemia, irritable bowel syndrome, multiple joint arthritis, migraine headaches, vitamin D deficiency, vitamin B12 deficiency, and chronic asthma. It is noted that her endocrinologist wanted to start losartan for blood pressure, patient is not currently taking medication and states her blood pressure is well controlled without it. She states the biggest reason she is here today is for her anxiety management as she no longer has refills on her Ativan.  She was last seen by her PCP in November, those records have been requested.  She states she is mainly on the Ativan for sleep.  When questioned if she had tried any other medications for sleep she answers no and that nothing else works for her.  She also states she will take Ativan at times during the day for anxiety.  When asked if she has ever been tried on any other medications for her anxiety she states no. GAD7 and PHQ9 unremarkable.   Depression screen [facility] 2/9 02/24/2021  Decreased Interest 0  Down, Depressed, Hopeless 0  PHQ - 2 Score 0  Altered sleeping 0  Tired, decreased energy 0  Change in appetite 0  Feeling bad or failure about  yourself  0  Trouble concentrating 0  Moving slowly or fidgety/restless 0  Suicidal thoughts 0  PHQ-9 Score 0  Difficult doing work/chores Not difficult at all   GAD 7 : Generalized Anxiety Score 02/24/2021  Nervous, Anxious, on Edge 0  Control/stop worrying 0  Worry too much - different things 0  Trouble relaxing 0  Restless 0  Easily annoyed or irritable 0  Afraid - awful might happen 0  Total GAD 7 Score 0  Anxiety Difficulty Not difficult at all    Relevant past medical, surgical, family, and social history reviewed and updated as indicated.  Allergies and medications reviewed and updated. Data reviewed: Chart in Epic.   Past Medical History:  Diagnosis Date   Allergy    Arthritis    DDD   Asthma    Colon cancer ([place]) 2008   Diabetes mellitus    Elevated liver enzymes    GERD (gastroesophageal reflux disease)    History of thyroiditis 12/18/2007   Formatting of this note might be different from the original. Thyroiditis   Hyperlipidemia    Iron deficiency anemia due to chronic blood loss 09/17/2020   Thyroid tumor, benign     Past Surgical History:  Procedure Laterality Date   BIOPSY THYROID     X2   CESAREAN SECTION     x 1   COLON SURGERY     FOOT FRACTURE SURGERY Left 02/2015   INCISIONAL HERNIA REPAIR     NASAL  SEPTUM SURGERY     PARTIAL HYSTERECTOMY     POLYPECTOMY     UPPER GASTROINTESTINAL ENDOSCOPY     WRIST GANGLION EXCISION     left    Social History   Socioeconomic History   Marital status: Married    Spouse name: Not on file   Number of children: 2   Years of education: Not on file   Highest education level: Not on file  Occupational History   Occupation: claims for [employer]    Employer: [employer]  Tobacco Use   Smoking status: Never   Smokeless tobacco: Never   Tobacco comments:    never used product  Vaping Use   Vaping Use: Never used  Substance and Sexual Activity   Alcohol use: No    Alcohol/week: 0.0 standard drinks   Drug  use: No   Sexual activity: Not on file  Other Topics Concern   Not on file  Social History Narrative   Not on file   Social Determinants of Health   Financial Resource Strain: Not on file  Food Insecurity: Not on file  Transportation Needs: Not on file  Physical Activity: Not on file  Stress: Not on file  Social Connections: Not on file  Intimate Partner Violence: Not on file    Outpatient Encounter Medications as of 02/24/2021  Medication Sig   albuterol (PROVENTIL HFA;VENTOLIN HFA) 108 (90 BASE) MCG/ACT inhaler Inhale 1 puff into the lungs every 6 (six) hours as needed. For shortness for breath   ASMANEX HFA 200 MCG/ACT AERO INHALE 1 PUFF INTO THE LUNGS IN THE MORNING AND AT BEDTIME.   BLACK ELDERBERRY PO Take 1 tablet by mouth daily.   Cetirizine HCl 10 MG CAPS Take 10 mg by mouth as needed.   cholecalciferol (VITAMIN D) 25 MCG (1000 UT) tablet Take 1,000 Units by mouth daily.   EPINEPHrine 0.3 mg/0.3 mL IJ SOAJ injection as needed.   hydrOXYzine (VISTARIL) 25 MG capsule Take 1 capsule (25 mg total) by mouth every 8 (eight) hours as needed.   insulin glargine (LANTUS) 100 UNIT/ML injection Inject 60 Units into the skin daily.   LORazepam (ATIVAN) 0.5 MG tablet Take 0.5 mg by mouth 2 (two) times daily as needed.   metFORMIN (GLUCOPHAGE-XR) 500 MG 24 hr tablet Take 1,000 mg by mouth 2 (two) times daily.   RABEprazole (ACIPHEX) 20 MG tablet TAKE 1 TABLET BY MOUTH TWICE A DAY   Spacer/Aero-Holding Chambers (AEROCHAMBER PLUS WITH MASK) inhaler Use as directed   [DISCONTINUED] guaiFENesin 200 MG tablet Take 400 mg by mouth 2 (two) times daily.   [DISCONTINUED] nirmatrelvir & ritonavir (PAXLOVID, 300/100,) 20 x 150 MG & 10 x 100MG TBPK Take 1 tablet by mouth 2 (two) times daily.   [DISCONTINUED] ondansetron (ZOFRAN ODT) 4 MG disintegrating tablet Take 1 tablet every 8 hours as needed for nausea during prep   [DISCONTINUED] predniSONE (DELTASONE) 20 MG tablet Take 2 tablets daily with  breakfast.   [DISCONTINUED] PROCTOSOL HC 2.5 % rectal cream PLACE 1 APPLICATION RECTALLY 2 (TWO) TIMES DAILY.   [DISCONTINUED] benzonatate (TESSALON) 100 MG capsule Take 1-2 capsules (100-200 mg total) by mouth 3 (three) times daily as needed for cough.   [DISCONTINUED] oseltamivir (TAMIFLU) 75 MG capsule Take 1 capsule (75 mg total) by mouth 2 (two) times daily.   [DISCONTINUED] promethazine-dextromethorphan (PROMETHAZINE-DM) 6.25-15 MG/5ML syrup Take 5 mLs by mouth at bedtime as needed for cough.   [DISCONTINUED] sucralfate (CARAFATE) 1 g tablet TAKE 1 TABLET (  1 G TOTAL) BY MOUTH 2 (TWO) TIMES DAILY.   [DISCONTINUED] 0.9 %  sodium chloride infusion    No facility-administered encounter medications on file as of 02/24/2021.    Allergies  Allergen Reactions   Fluticasone-Salmeterol Other (See Comments) and Palpitations   Morphine Other (See Comments) and Palpitations    REACTION: heart racing   Peanut Oil Other (See Comments) and Cough    congested   Peanut-Containing Drug Products Cough   Penicillins Rash    REACTION: rash   Sulfa Antibiotics Other (See Comments)   Cyclobenzaprine Other (See Comments)   Loracarbef Other (See Comments)    Derm.    Metronidazole    Moxifloxacin Hcl In Nacl    Other     Oats and Green peppers---headaches   Penicillin G Sodium Other (See Comments)    Derm.   Sulfamethazine    Sulfamethoxazole Nausea Only   Sulfonamide Derivatives    Ciprofloxacin Anxiety and Other (See Comments)   Clindamycin Rash    Review of Systems  Constitutional:  Negative for activity change, appetite change, chills, diaphoresis, fatigue, fever and unexpected weight change.  HENT: Negative.    Eyes: Negative.   Respiratory:  Negative for cough, chest tightness and shortness of breath.   Cardiovascular:  Negative for chest pain, palpitations and leg swelling.  Gastrointestinal:  Negative for abdominal pain, blood in stool, constipation, diarrhea, nausea and vomiting.   Endocrine: Negative.   Genitourinary:  Negative for decreased urine volume, difficulty urinating, dysuria, frequency and urgency.  Musculoskeletal:  Positive for arthralgias, back pain, gait problem, joint swelling and myalgias. Negative for neck pain and neck stiffness.  Skin: Negative.   Allergic/Immunologic: Negative.   Neurological:  Negative for dizziness, tremors, seizures, syncope, facial asymmetry, speech difficulty, weakness, light-headedness, numbness and headaches.  Hematological: Negative.   Psychiatric/Behavioral:  Positive for sleep disturbance. Negative for agitation, behavioral problems, confusion, decreased concentration, dysphoric mood, hallucinations, self-injury and suicidal ideas. The patient is not nervous/anxious and is not hyperactive.   All other systems reviewed and are negative.      Objective:  BP (!) 145/87    Pulse (!) 106    Temp 98.3 F (36.8 C)    Ht 5' 1"  (1.549 m)    Wt 136 lb (61.7 kg)    SpO2 97%    BMI 25.70 kg/m    Wt Readings from Last 3 Encounters:  02/24/21 136 lb (61.7 kg)  12/09/20 142 lb (64.4 kg)  09/15/20 142 lb (64.4 kg)    Physical Exam Vitals and nursing note reviewed.  Constitutional:      General: She is not in acute distress.    Appearance: Normal appearance. She is well-developed, well-groomed and normal weight. She is not ill-appearing, toxic-appearing or diaphoretic.  HENT:     Head: Normocephalic and atraumatic.     Jaw: There is normal jaw occlusion.     Right Ear: Hearing normal.     Left Ear: Hearing normal.     Nose: Nose normal.     Mouth/Throat:     Lips: Pink.     Mouth: Mucous membranes are moist.     Pharynx: Oropharynx is clear. Uvula midline.  Eyes:     General: Lids are normal.     Extraocular Movements: Extraocular movements intact.     Conjunctiva/sclera: Conjunctivae normal.     Pupils: Pupils are equal, round, and reactive to light.  Neck:     Thyroid: No thyroid mass, thyromegaly or thyroid  tenderness.     Vascular: No carotid bruit or JVD.     Trachea: Trachea and phonation normal.  Cardiovascular:     Rate and Rhythm: Normal rate and regular rhythm.     Chest Wall: PMI is not displaced.     Pulses: Normal pulses.     Heart sounds: Normal heart sounds. No murmur heard.   No friction rub. No gallop.  Pulmonary:     Effort: Pulmonary effort is normal. No respiratory distress.     Breath sounds: Normal breath sounds. No wheezing.  Abdominal:     General: Bowel sounds are normal. There is no distension or abdominal bruit.     Palpations: Abdomen is soft. There is no hepatomegaly or splenomegaly.     Tenderness: There is no abdominal tenderness. There is no right CVA tenderness or left CVA tenderness.     Hernia: No hernia is present.  Musculoskeletal:     Cervical back: Normal range of motion and neck supple.     Right lower leg: No edema.     Left lower leg: No edema.  Lymphadenopathy:     Cervical: No cervical adenopathy.  Skin:    General: Skin is warm and dry.     Capillary Refill: Capillary refill takes less than 2 seconds.     Coloration: Skin is not cyanotic, jaundiced or pale.     Findings: No rash.  Neurological:     General: No focal deficit present.     Mental Status: She is alert and oriented to person, place, and time.     Sensory: Sensation is intact.     Motor: Motor function is intact.     Coordination: Coordination is intact.     Gait: Gait is intact. Gait normal.     Deep Tendon Reflexes: Reflexes are normal and symmetric.  Psychiatric:        Attention and Perception: Attention and perception normal.        Mood and Affect: Mood and affect normal.        Speech: Speech normal.        Behavior: Behavior normal. Behavior is cooperative.        Thought Content: Thought content normal.        Cognition and Memory: Cognition and memory normal.        Judgment: Judgment normal.    Results for orders placed or performed during the hospital  encounter of 02/16/21  Covid-19, Flu A+B ([facility])   Specimen: Nasopharyngeal   Naso  Result Value Ref Range   SARS-CoV-2, NAA Detected (A) Not Detected   Influenza A, NAA Not Detected Not Detected   Influenza B, NAA Not Detected Not Detected   Test Information: Comment        Pertinent labs & imaging results that were available during my care of the patient were reviewed by me and considered in my medical decision making.  Assessment & Plan:  Stephanie Carroll was seen today for new patient (initial visit).  Diagnoses and all orders for this visit:  GAD (generalized anxiety disorder) Patient offered SSRI or SNRI therapy and declined, states she would not take antidepressants.  Depression and anxiety screenings unremarkable.  Had a long discussion with patient about proper treatment of anxiety and insomnia.  Patient aware that Ativan is a controlled substance and also not recommended for sleep.  Aware controlled substances cannot be filled on first visit and will likely not be continued for sleep.  Declines trazodone.  States  melatonin does not work.  Will trial Vistaril as prescribed.  Sedation precautions discussed in detail.  Sleep hygiene discussed. -     hydrOXYzine (VISTARIL) 25 MG capsule; Take 1 capsule (25 mg total) by mouth nightly as needed or sleep, may repeat once.  Hyperlipidemia associated with type 2 diabetes mellitus ([place]) Not on statin therapy.  Recent labs have been requested will initiate therapy if warranted.  Hypertension associated with type 2 diabetes mellitus ([place]) Patient is supposed to be on losartan and is not taking.  Discussed importance of this medicine with her diagnosis of diabetes.  Patient declines medication.  Type 2 diabetes mellitus without complication, with long-term current use of insulin ([place]) Managed by endocrinology, last A1c 9.4.  Patient aware to follow-up in 4 weeks for reevaluation of anxiety and chronic medical conditions.  If labs have not been  received at this time, will complete in office.  We will further discuss appropriate treatment for hypertension hyperlipidemia and diabetic patient's.    Continue all other maintenance medications.  Follow up plan: Return in about 4 weeks (around 03/24/2021), or if symptoms worsen or fail to improve.   Continue healthy lifestyle choices, including diet (rich in fruits, vegetables, and lean proteins, and low in salt and simple carbohydrates) and exercise (at least 30 minutes of moderate physical activity daily).  Educational handout given for health maintenance  The above assessment and management plan was discussed with the patient. The patient verbalized understanding of and has agreed to the management plan. Patient is aware to call the clinic if they develop any new symptoms or if symptoms persist or worsen. Patient is aware when to return to the clinic for a follow-up visit. Patient educated on when it is appropriate to go to the emergency department.   Monia Pouch, FNP-C [facility] [phone]

## 2021-02-25 ENCOUNTER — Telehealth: Payer: Self-pay | Admitting: Family Medicine

## 2021-02-25 NOTE — Telephone Encounter (Signed)
Pt called stating that she forgot to ask PCP at her visit if there is something she can recommend for her to take with electrolytes in it because she hasnt really had an appetite since having covid.

## 2021-02-25 NOTE — Telephone Encounter (Signed)
Pt aware of provider feedback and voiced understanding.

## 2021-02-27 ENCOUNTER — Ambulatory Visit
Admission: EM | Admit: 2021-02-27 | Discharge: 2021-02-27 | Disposition: A | Discharge status: Home / Self Care | Payer: 59 | Attending: Family Medicine | Admitting: Family Medicine

## 2021-02-27 ENCOUNTER — Other Ambulatory Visit: Payer: Self-pay

## 2021-02-27 ENCOUNTER — Encounter: Payer: Self-pay | Admitting: Emergency Medicine

## 2021-02-27 DIAGNOSIS — Z794 Long term (current) use of insulin: Secondary | ICD-10-CM | POA: Diagnosis not present

## 2021-02-27 DIAGNOSIS — E118 Type 2 diabetes mellitus with unspecified complications: Secondary | ICD-10-CM

## 2021-02-27 DIAGNOSIS — R0781 Pleurodynia: Secondary | ICD-10-CM | POA: Diagnosis not present

## 2021-02-27 DIAGNOSIS — E119 Type 2 diabetes mellitus without complications: Secondary | ICD-10-CM

## 2021-02-27 DIAGNOSIS — J4541 Moderate persistent asthma with (acute) exacerbation: Secondary | ICD-10-CM

## 2021-02-27 MED ORDER — DEXAMETHASONE SODIUM PHOSPHATE 10 MG/ML IJ SOLN
10.0000 mg | Freq: Once | INTRAMUSCULAR | Status: AC
  Administered 2021-02-27: 10 mg via INTRAMUSCULAR

## 2021-02-27 NOTE — ED Provider Notes (Signed)
[facility]    CSN: [id number] Arrival date & time: 02/27/21  1229      History   Chief Complaint Chief Complaint  Patient presents with   Rib Injury    HPI Stephanie Carroll is a 66 y.o. female.   Patient presenting today with ongoing hacking cough, chest tightness, wheezing and now bilateral rib soreness following COVID diagnosis 2 weeks ago.  She took a course of Paxil of it and prednisone and states this did help tremendously but she feels that her asthma is still exacerbated.  She states she is concerned that she might have pneumonia given her new rib soreness.  She denies fever, shortness of breath, chest pain, lingering congestion or sore throat.  Has been consistent with her inhaler and breathing treatment regimen and states this does help.  She does have a history of insulin-dependent diabetes and states that her sugars have been under good control despite the prednisone.   Past Medical History:  Diagnosis Date   Allergy    Arthritis    DDD   Asthma    Colon cancer ([place]) 2008   Diabetes mellitus    Elevated liver enzymes    GERD (gastroesophageal reflux disease)    History of thyroiditis 12/18/2007   Formatting of this note might be different from the original. Thyroiditis   Hyperlipidemia    Iron deficiency anemia due to chronic blood loss 09/17/2020   Thyroid tumor, benign     Patient Active Problem List   Diagnosis Date Noted   Diabetic retinopathy ([place]) 02/24/2021   Vitamin B12 deficiency 02/24/2021   GAD (generalized anxiety disorder) 02/24/2021   Hypertension associated with type 2 diabetes mellitus ([place]) 10/23/2020   Iron deficiency anemia due to chronic blood loss 09/17/2020   Chronic asthma, mild persistent, uncomplicated 79/72/8206   Carpal tunnel syndrome of right wrist 11/06/2017   Cervical radiculopathy 10/20/2017   DDD (degenerative disc disease), cervical 10/20/2017   Acute bronchiolitis, unspecified 03/23/2015   Environmental allergies  03/23/2015   Hand arthritis 03/23/2015   Multiple joint pain 03/23/2015   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   Type 2 diabetes mellitus without complication, with long-term current use of insulin ([place]) 03/23/2015   Internal hemorrhoids 01/56/1537   Non-alcoholic fatty liver disease 11/27/2013   Rectal bleeding 09/20/2013   Personal history of colon cancer, stage I 09/20/2013   Vitamin D deficiency 12/19/2008   History of malignant neoplasm of large intestine 08/04/2008   Hyperlipidemia associated with type 2 diabetes mellitus ([place]) 06/19/2008   Irritable bowel syndrome 03/19/2008   Diaphragmatic hernia 12/18/2007   Gastro-esophageal reflux disease without esophagitis 06/06/2007   Migraine headache 02/12/2007    Past Surgical History:  Procedure Laterality Date   BIOPSY THYROID     X2   CESAREAN SECTION     x 1   COLON SURGERY     FOOT FRACTURE SURGERY Left 02/2015   INCISIONAL HERNIA REPAIR     NASAL SEPTUM SURGERY     PARTIAL HYSTERECTOMY     POLYPECTOMY     UPPER GASTROINTESTINAL ENDOSCOPY     WRIST GANGLION EXCISION     left    OB History   No obstetric history on file.      Home Medications    Prior to Admission medications   Medication Sig Start Date End Date Taking? Authorizing Provider  albuterol (PROVENTIL HFA;VENTOLIN HFA) 108 (90 BASE) MCG/ACT inhaler Inhale 1 puff into the lungs every 6 (six) hours as needed. For  shortness for breath    [provider]  [facility] HFA 200 MCG/ACT AERO INHALE 1 PUFF INTO THE LUNGS IN THE MORNING AND AT BEDTIME. 09/08/20   Tanda Rockers, MD  BLACK ELDERBERRY PO Take 1 tablet by mouth daily.    [provider]  Cetirizine HCl 10 MG CAPS Take 10 mg by mouth as needed. 04/15/18   [provider]  cholecalciferol (VITAMIN D) 25 MCG (1000 UT) tablet Take 1,000 Units by mouth daily. 02/21/18   [provider]  EPINEPHrine 0.3 mg/0.3 mL IJ SOAJ injection as needed.    [provider]  hydrOXYzine  (VISTARIL) 25 MG capsule Take 1 capsule (25 mg total) by mouth every 8 (eight) hours as needed. 02/24/21   Baruch Gouty, FNP  insulin glargine (LANTUS) 100 UNIT/ML injection Inject 60 Units into the skin daily.    [provider]  metFORMIN (GLUCOPHAGE-XR) 500 MG 24 hr tablet Take 1,000 mg by mouth 2 (two) times daily.    [provider]  RABEprazole (ACIPHEX) 20 MG tablet TAKE 1 TABLET BY MOUTH TWICE A DAY 12/02/20   Mauri Pole, MD  Spacer/Aero-Holding Chambers (AEROCHAMBER PLUS WITH MASK) inhaler Use as directed 08/25/20   Tanda Rockers, MD    Family History Family History  Problem Relation Age of Onset   COPD Other        both sides   Diabetes Other        both sides   Heart disease Father    Kidney failure Father    Lung cancer Paternal Uncle    Leukemia Paternal Grandfather    Cervical cancer Paternal Aunt        x 2    Heart defect Mother        MVP   Breast cancer Mother    Colon cancer Neg Hx    Esophageal cancer Neg Hx    Rectal cancer Neg Hx    Stomach cancer Neg Hx     Social History Social History   Tobacco Use   Smoking status: Never   Smokeless tobacco: Never   Tobacco comments:    never used product  Vaping Use   Vaping Use: Never used  Substance Use Topics   Alcohol use: No    Alcohol/week: 0.0 standard drinks   Drug use: No     Allergies   Fluticasone-salmeterol, Morphine, Peanut oil, Peanut-containing drug products, Penicillins, Sulfa antibiotics, Cyclobenzaprine, Loracarbef, Metronidazole, Moxifloxacin hcl in nacl, Other, Penicillin g sodium, Sulfamethazine, Sulfamethoxazole, Sulfonamide derivatives, Ciprofloxacin, and Clindamycin   Review of Systems Review of Systems Per HPI  Physical Exam Triage Vital Signs ED Triage Vitals  Enc Vitals Group     BP 02/27/21 1248 (!) 163/84     Pulse Rate 02/27/21 1248 (!) 113     Resp 02/27/21 1248 18     Temp 02/27/21 1248 97.9 F (36.6 C)     Temp Source 02/27/21 1248  Oral     SpO2 02/27/21 1248 96 %     Weight 02/27/21 1249 136 lb (61.7 kg)     Height 02/27/21 1249 5' 1"  (1.549 m)     Head Circumference --      Peak Flow --      Pain Score 02/27/21 1249 4     Pain Loc --      Pain Edu? --      Excl. in [place]? --    No data found.  Updated Vital Signs  BP (!) 163/84 (BP Location: Right Arm)    Pulse (!) 113    Temp 97.9 F (36.6 C) (Oral)    Resp 18    Ht 5' 1"  (1.549 m)    Wt 136 lb (61.7 kg)    SpO2 96%    BMI 25.70 kg/m   Visual Acuity Right Eye Distance:   Left Eye Distance:   Bilateral Distance:    Right Eye Near:   Left Eye Near:    Bilateral Near:     Physical Exam Vitals and nursing note reviewed.  Constitutional:      Appearance: Normal appearance.  HENT:     Head: Atraumatic.     Right Ear: Tympanic membrane and external ear normal.     Left Ear: Tympanic membrane and external ear normal.     Nose: Nose normal.     Mouth/Throat:     Mouth: Mucous membranes are moist.     Pharynx: Posterior oropharyngeal erythema present.  Eyes:     Extraocular Movements: Extraocular movements intact.     Conjunctiva/sclera: Conjunctivae normal.  Cardiovascular:     Rate and Rhythm: Normal rate and regular rhythm.     Heart sounds: Normal heart sounds.  Pulmonary:     Effort: Pulmonary effort is normal.     Breath sounds: Normal breath sounds. No wheezing or rales.  Musculoskeletal:        General: Normal range of motion.     Cervical back: Normal range of motion and neck supple.  Skin:    General: Skin is warm and dry.  Neurological:     Mental Status: She is alert and oriented to person, place, and time.  Psychiatric:        Mood and Affect: Mood normal.        Thought Content: Thought content normal.     UC Treatments / Results  Labs (all labs ordered are listed, but only abnormal results are displayed) Labs Reviewed - No data to display  EKG   Radiology No results found.  Procedures Procedures (including critical  care time)  Medications Ordered in UC Medications  dexamethasone (DECADRON) injection 10 mg (10 mg Intramuscular Given 02/27/21 1334)    Initial Impression / Assessment and Plan / UC Course  I have reviewed the triage vital signs and the nursing notes.  Pertinent labs & imaging results that were available during my care of the patient were reviewed by me and considered in my medical decision making (see chart for details).     Mildly tachycardic in triage, this appears to be near her baseline.  She is otherwise well-appearing, speaking in full sentences and breathing comfortably on room air.  Her oxygen saturation today on room air is 96%.  We will give IM Decadron to further help reduce her asthma exacerbation secondary to COVID-19 infection.  No evidence of pneumonia today, lungs clear to auscultation bilaterally and oxygen saturation within normal limits.  We will forego chest x-ray shared decision making.  Continue asthma regimen, over-the-counter supportive medications and home care.  Final Clinical Impressions(s) / UC Diagnoses   Final diagnoses:  Rib pain  Moderate persistent asthma with acute exacerbation   Discharge Instructions   None    ED Prescriptions   None    PDMP not reviewed this encounter.   Volney American, [place] 02/27/21 1335

## 2021-02-27 NOTE — ED Triage Notes (Signed)
Pt reports was diagnosed with covid x2 weeks ago. Pt reports bilateral rib pain, denies any injury.nad noted.

## 2021-03-01 ENCOUNTER — Telehealth: Payer: Self-pay | Admitting: Family Medicine

## 2021-03-02 ENCOUNTER — Other Ambulatory Visit: Payer: Self-pay | Admitting: Family Medicine

## 2021-03-02 DIAGNOSIS — E119 Type 2 diabetes mellitus without complications: Secondary | ICD-10-CM

## 2021-03-05 ENCOUNTER — Other Ambulatory Visit (INDEPENDENT_AMBULATORY_CARE_PROVIDER_SITE_OTHER): Payer: 59

## 2021-03-05 DIAGNOSIS — D500 Iron deficiency anemia secondary to blood loss (chronic): Secondary | ICD-10-CM | POA: Diagnosis not present

## 2021-03-05 LAB — CBC WITH DIFFERENTIAL/PLATELET
Basophils Absolute: 0.1 10*3/uL (ref 0.0–0.1)
Basophils Relative: 0.8 % (ref 0.0–3.0)
Eosinophils Absolute: 0.3 10*3/uL (ref 0.0–0.7)
Eosinophils Relative: 3.2 % (ref 0.0–5.0)
HCT: 39 % (ref 36.0–46.0)
Hemoglobin: 13.1 g/dL (ref 12.0–15.0)
Lymphocytes Relative: 30.5 % (ref 12.0–46.0)
Lymphs Abs: 2.8 10*3/uL (ref 0.7–4.0)
MCHC: 33.6 g/dL (ref 30.0–36.0)
MCV: 89.2 fl (ref 78.0–100.0)
Monocytes Absolute: 0.7 10*3/uL (ref 0.1–1.0)
Monocytes Relative: 7.8 % (ref 3.0–12.0)
Neutro Abs: 5.4 10*3/uL (ref 1.4–7.7)
Neutrophils Relative %: 57.7 % (ref 43.0–77.0)
Platelets: 227 10*3/uL (ref 150.0–400.0)
RBC: 4.37 Mil/uL (ref 3.87–5.11)
RDW: 12.7 % (ref 11.5–15.5)
WBC: 9.3 10*3/uL (ref 4.0–10.5)

## 2021-03-06 LAB — IRON,TIBC AND FERRITIN PANEL
%SAT: 30 % (calc) (ref 16–45)
Ferritin: 181 ng/mL (ref 16–288)
Iron: 88 ug/dL (ref 45–160)
TIBC: 291 mcg/dL (calc) (ref 250–450)

## 2021-03-10 ENCOUNTER — Other Ambulatory Visit: Payer: Self-pay | Admitting: Gastroenterology

## 2021-03-11 ENCOUNTER — Other Ambulatory Visit: Payer: Self-pay | Admitting: Gastroenterology

## 2021-03-12 ENCOUNTER — Other Ambulatory Visit: Payer: Self-pay | Admitting: Gastroenterology

## 2021-03-12 ENCOUNTER — Telehealth: Payer: Self-pay | Admitting: Gastroenterology

## 2021-03-12 NOTE — Telephone Encounter (Signed)
Inbound call from patient pharmacy. States patient need PA for Rabeprazole 45m 2x a day

## 2021-03-12 NOTE — Telephone Encounter (Signed)
Prior Authorization has been sent to patient insurance company 03/12/21.

## 2021-03-23 ENCOUNTER — Encounter: Payer: Self-pay | Admitting: Family

## 2021-03-23 ENCOUNTER — Other Ambulatory Visit: Payer: Self-pay

## 2021-03-23 ENCOUNTER — Inpatient Hospital Stay: Payer: 59 | Attending: Hematology & Oncology

## 2021-03-23 ENCOUNTER — Inpatient Hospital Stay: Payer: 59 | Admitting: Family

## 2021-03-23 VITALS — BP 152/80 | HR 109 | Temp 97.6°F | Resp 18 | Ht 61.0 in | Wt 143.0 lb

## 2021-03-23 DIAGNOSIS — D500 Iron deficiency anemia secondary to blood loss (chronic): Secondary | ICD-10-CM | POA: Diagnosis not present

## 2021-03-23 DIAGNOSIS — C182 Malignant neoplasm of ascending colon: Secondary | ICD-10-CM

## 2021-03-23 DIAGNOSIS — C187 Malignant neoplasm of sigmoid colon: Secondary | ICD-10-CM | POA: Diagnosis present

## 2021-03-23 DIAGNOSIS — R5383 Other fatigue: Secondary | ICD-10-CM | POA: Insufficient documentation

## 2021-03-23 DIAGNOSIS — Z885 Allergy status to narcotic agent status: Secondary | ICD-10-CM | POA: Insufficient documentation

## 2021-03-23 DIAGNOSIS — Z882 Allergy status to sulfonamides status: Secondary | ICD-10-CM | POA: Insufficient documentation

## 2021-03-23 DIAGNOSIS — Z880 Allergy status to penicillin: Secondary | ICD-10-CM | POA: Diagnosis not present

## 2021-03-23 DIAGNOSIS — Z8616 Personal history of COVID-19: Secondary | ICD-10-CM | POA: Diagnosis not present

## 2021-03-23 DIAGNOSIS — Z881 Allergy status to other antibiotic agents status: Secondary | ICD-10-CM | POA: Insufficient documentation

## 2021-03-23 DIAGNOSIS — R0602 Shortness of breath: Secondary | ICD-10-CM | POA: Insufficient documentation

## 2021-03-23 DIAGNOSIS — Z79899 Other long term (current) drug therapy: Secondary | ICD-10-CM | POA: Insufficient documentation

## 2021-03-23 LAB — CBC WITH DIFFERENTIAL (CANCER CENTER ONLY)
Abs Immature Granulocytes: 0.06 10*3/uL (ref 0.00–0.07)
Basophils Absolute: 0.1 10*3/uL (ref 0.0–0.1)
Basophils Relative: 1 %
Eosinophils Absolute: 0.3 10*3/uL (ref 0.0–0.5)
Eosinophils Relative: 4 %
HCT: 37.1 % (ref 36.0–46.0)
Hemoglobin: 12.8 g/dL (ref 12.0–15.0)
Immature Granulocytes: 1 %
Lymphocytes Relative: 33 %
Lymphs Abs: 2.8 10*3/uL (ref 0.7–4.0)
MCH: 31 pg (ref 26.0–34.0)
MCHC: 34.5 g/dL (ref 30.0–36.0)
MCV: 89.8 fL (ref 80.0–100.0)
Monocytes Absolute: 0.6 10*3/uL (ref 0.1–1.0)
Monocytes Relative: 7 %
Neutro Abs: 4.8 10*3/uL (ref 1.7–7.7)
Neutrophils Relative %: 54 %
Platelet Count: 206 10*3/uL (ref 150–400)
RBC: 4.13 MIL/uL (ref 3.87–5.11)
RDW: 12.8 % (ref 11.5–15.5)
WBC Count: 8.7 10*3/uL (ref 4.0–10.5)
nRBC: 0 % (ref 0.0–0.2)

## 2021-03-23 LAB — CMP (CANCER CENTER ONLY)
ALT: 58 U/L — ABNORMAL HIGH (ref 0–44)
AST: 39 U/L (ref 15–41)
Albumin: 4.2 g/dL (ref 3.5–5.0)
Alkaline Phosphatase: 100 U/L (ref 38–126)
Anion gap: 13 (ref 5–15)
BUN: 12 mg/dL (ref 8–23)
CO2: 22 mmol/L (ref 22–32)
Calcium: 9.6 mg/dL (ref 8.9–10.3)
Chloride: 101 mmol/L (ref 98–111)
Creatinine: 0.76 mg/dL (ref 0.44–1.00)
GFR, Estimated: >60 mL/min (ref >60)
Glucose, Bld: 307 mg/dL — ABNORMAL HIGH (ref 70–99)
Potassium: 3.6 mmol/L (ref 3.5–5.1)
Sodium: 136 mmol/L (ref 135–145)
Total Bilirubin: 0.4 mg/dL (ref 0.3–1.2)
Total Protein: 7 g/dL (ref 6.5–8.1)

## 2021-03-23 LAB — IRON AND IRON BINDING CAPACITY (CC-WL,HP ONLY)
Iron: 73 ug/dL (ref 28–170)
Saturation Ratios: 21 % (ref 10.4–31.8)
TIBC: 343 ug/dL (ref 250–450)
UIBC: 270 ug/dL (ref 148–442)

## 2021-03-23 LAB — CEA (IN HOUSE-CHCC): CEA (CHCC-In House): 1.42 ng/mL (ref 0.00–5.00)

## 2021-03-23 LAB — LACTATE DEHYDROGENASE: LDH: 128 U/L (ref 98–192)

## 2021-03-23 LAB — FERRITIN: Ferritin: 207 ng/mL (ref 11–307)

## 2021-03-23 NOTE — Progress Notes (Signed)
Hematology and Oncology Follow Up Visit  Stephanie Carroll Para [id number] Apr 17, 1955 66 y.o. 03/23/2021   Principle Diagnosis:  Stage II carcinoma of colon   Current Therapy:        Observation   Interim History:  Stephanie Carroll is here today for follow-up. She is doing well recuperating since having Covid in January.  She still has some fatigue and mild SOB with over exertion.  She is exercising several days a week and rests as needed.  CEA last August was stable at 1.83.  No fever, chills, n/v, cough, rash, dizziness, chest pain, palpitations, abdominal pain or changes in bowel or bladder habits.  She had an EGD and colonoscopy on 12/09/2020. These showed gastritis and a single benign mucosal nodule in the stomach as well as benign polyps removed from the descending colon and rectum, diverticulitis in the sigmoid colon and non bleeding internal hemorrhoids.  No blood loss noted. No abnormal bruising, no petechiae.  She states that her appetite comes and goes. She is doing her best to stay well hydrated. Her weight is stable at 143 lbs.   ECOG Performance Status: 1 - Symptomatic but completely ambulatory  Medications:  Allergies as of 03/23/2021       Reactions   Fluticasone-salmeterol Palpitations   Morphine Palpitations, Other (See Comments)   REACTION: heart racing   Peanut Oil Other (See Comments), Cough   congested   Peanut-containing Drug Products Cough   Sulfa Antibiotics Other (See Comments)   REACTION: takes away appetite, makes her feel nervous, jittery   Other Other (See Comments)   Oats and Green peppers---headaches   Penicillins Rash   Sulfamethoxazole Nausea Only   Ciprofloxacin Anxiety   Clindamycin Rash   Cyclobenzaprine Other (See Comments)   Loracarbef Rash      Metronidazole Other (See Comments)   Moxifloxacin Hcl In Nacl Anxiety   Penicillin G Sodium Rash        Medication List        Accurate as of March 23, 2021 10:52 AM. If you have any questions, ask  your nurse or doctor.          aerochamber plus with mask inhaler Use as directed   albuterol 108 (90 Base) MCG/ACT inhaler Commonly known as: VENTOLIN HFA Inhale 1 puff into the lungs every 6 (six) hours as needed. For shortness for breath   Asmanex HFA 200 MCG/ACT Aero Generic drug: Mometasone Furoate INHALE 1 PUFF INTO THE LUNGS IN THE MORNING AND AT BEDTIME.   BLACK ELDERBERRY PO Take 1 tablet by mouth daily.   Cetirizine HCl 10 MG Caps Take 10 mg by mouth as needed.   cholecalciferol 25 MCG (1000 UNIT) tablet Commonly known as: VITAMIN D Take 1,000 Units by mouth daily.   EPINEPHrine 0.3 mg/0.3 mL Soaj injection Commonly known as: EPI-PEN as needed.   hydrOXYzine 25 MG capsule Commonly known as: VISTARIL Take 1 capsule (25 mg total) by mouth every 8 (eight) hours as needed.   insulin glargine 100 UNIT/ML injection Commonly known as: LANTUS Inject 60 Units into the skin daily.   metFORMIN 500 MG 24 hr tablet Commonly known as: GLUCOPHAGE-XR Take 1,000 mg by mouth 2 (two) times daily.   RABEprazole 20 MG tablet Commonly known as: ACIPHEX TAKE 1 TABLET BY MOUTH TWICE A DAY        Allergies:  Allergies  Allergen Reactions   Fluticasone-Salmeterol Palpitations   Morphine Palpitations and Other (See Comments)    REACTION: heart racing  Peanut Oil Other (See Comments) and Cough    congested   Peanut-Containing Drug Products Cough   Sulfa Antibiotics Other (See Comments)    REACTION: takes away appetite, makes her feel nervous, jittery   Other Other (See Comments)    Oats and Green peppers---headaches   Penicillins Rash   Sulfamethoxazole Nausea Only   Ciprofloxacin Anxiety   Clindamycin Rash   Cyclobenzaprine Other (See Comments)   Loracarbef Rash        Metronidazole Other (See Comments)   Moxifloxacin Hcl In Nacl Anxiety   Penicillin G Sodium Rash    Past Medical History, Surgical history, Social history, and Family History were reviewed  and updated.  Review of Systems: All other 10 point review of systems is negative.   Physical Exam:  height is 5' 1"  (1.549 m) and weight is 143 lb (64.9 kg). Her oral temperature is 97.6 F (36.4 C). Her blood pressure is 152/80 (abnormal) and her pulse is 109 (abnormal). Her respiration is 18 and oxygen saturation is 100%.   Wt Readings from Last 3 Encounters:  03/23/21 143 lb (64.9 kg)  02/27/21 136 lb (61.7 kg)  02/24/21 136 lb (61.7 kg)    Ocular: Sclerae unicteric, pupils equal, round and reactive to light Ear-nose-throat: Oropharynx clear, dentition fair Lymphatic: No cervical or supraclavicular adenopathy Lungs no rales or rhonchi, good excursion bilaterally Heart regular rate and rhythm, no murmur appreciated Abd soft, nontender, positive bowel sounds MSK no focal spinal tenderness, no joint edema Neuro: non-focal, well-oriented, appropriate affect Breasts: Deferred   Lab Results  Component Value Date   WBC 8.7 03/23/2021   HGB 12.8 03/23/2021   HCT 37.1 03/23/2021   MCV 89.8 03/23/2021   PLT 206 03/23/2021   Lab Results  Component Value Date   FERRITIN 181 03/05/2021   IRON 88 03/05/2021   TIBC 291 03/05/2021   UIBC 348 09/15/2020   IRONPCTSAT 30 03/05/2021   Lab Results  Component Value Date   RETICCTPCT 2.0 03/01/2007   RBC 4.13 03/23/2021   RETICCTABS 101.4 03/01/2007   No results found for: Nils Pyle [facility] Lab Results  Component Value Date   IGA 371 06/19/2018   No results found for: Kathrynn Ducking, MSPIKE, SPEI   Chemistry      Component Value Date/Time   NA 136 03/23/2021 0958   NA 126 (L) 04/22/2016 1456   NA 141 11/29/2013 1454   K 3.6 03/23/2021 0958   K 4.1 04/22/2016 1456   K 3.8 11/29/2013 1454   CL 101 03/23/2021 0958   CL 95 (L) 04/22/2016 1456   CL 98 11/29/2013 1454   CO2 22 03/23/2021 0958   CO2 21 04/22/2016 1456   CO2 25 11/29/2013 1454   BUN 12 03/23/2021  0958   BUN 6 04/22/2016 1456   BUN 12 11/29/2013 1454   CREATININE 0.76 03/23/2021 0958   CREATININE 0.55 (L) 04/22/2016 1456   CREATININE 0.8 11/29/2013 1454      Component Value Date/Time   CALCIUM 9.6 03/23/2021 0958   CALCIUM 9.5 04/22/2016 1456   CALCIUM 9.2 11/29/2013 1454   ALKPHOS 100 03/23/2021 0958   ALKPHOS 103 04/22/2016 1456   ALKPHOS 93 (H) 11/29/2013 1454   AST 39 03/23/2021 0958   ALT 58 (H) 03/23/2021 0958   ALT 36 11/29/2013 1454   BILITOT 0.4 03/23/2021 0958       Impression and Plan: Ms. Harbour is a very pleasant 66 yo caucasian  female with history of 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 She did not require any adjuvant therapy.  She continues to do well and so far there has been no evidence of recurrence.  CEA level is pending.  Follow-up in 6 months.   Lottie Dawson, NP 2/7/202310:52 AM

## 2021-03-24 ENCOUNTER — Encounter: Payer: Self-pay | Admitting: Family Medicine

## 2021-03-24 ENCOUNTER — Ambulatory Visit: Payer: 59 | Admitting: Family Medicine

## 2021-03-24 VITALS — BP 147/78 | HR 112 | Temp 98.2°F | Ht 61.0 in | Wt 142.1 lb

## 2021-03-24 DIAGNOSIS — F5101 Primary insomnia: Secondary | ICD-10-CM | POA: Diagnosis not present

## 2021-03-24 NOTE — Progress Notes (Addendum)
Subjective:  Patient ID: Stephanie Carroll, female    DOB: [birth year], 66 y.o.   MRN: [id number]  Patient Care Team: Baruch Gouty, FNP as PCP - General (Family Medicine)   Chief Complaint:  Anxiety and Insomnia   HPI: Stephanie Carroll is a 66 y.o. female presenting on 03/24/2021 for Anxiety and Insomnia   Pt presents for 1 month follow up regarding insomnia. She states unable to tolerate hydroxyzine due to next day grogginess. She also states she only tried hydroxyzine one night. This was also trailed while she had COVID and was fatigued. She desires lorazepam for sleep and states she still has a bottle from her previous provider.   Anxiety Symptoms include insomnia. Patient reports no chest pain, dizziness, palpitations or shortness of breath.    Insomnia Primary symptoms: sleep disturbance.    Depression screen [facility] 2/9 03/24/2021 02/24/2021  Decreased Interest 0 0  Down, Depressed, Hopeless 0 0  PHQ - 2 Score 0 0  Altered sleeping 1 0  Tired, decreased energy 1 0  Change in appetite 0 0  Feeling bad or failure about yourself  0 0  Trouble concentrating 0 0  Moving slowly or fidgety/restless 0 0  Suicidal thoughts 0 0  PHQ-9 Score 2 0  Difficult doing work/chores Not difficult at all Not difficult at all   GAD 7 : Generalized Anxiety Score 03/24/2021 02/24/2021  Nervous, Anxious, on Edge 0 0  Control/stop worrying 0 0  Worry too much - different things 0 0  Trouble relaxing 0 0  Restless 0 0  Easily annoyed or irritable 0 0  Afraid - awful might happen 0 0  Total GAD 7 Score 0 0  Anxiety Difficulty Not difficult at all Not difficult at all        Relevant past medical, surgical, family, and social history reviewed and updated as indicated.  Allergies and medications reviewed and updated. Data reviewed: Chart in Epic.   Past Medical History:  Diagnosis Date   Allergy    Arthritis    DDD   Asthma    Colon cancer ([place]) 2008   Diabetes mellitus    Elevated liver  enzymes    GERD (gastroesophageal reflux disease)    History of thyroiditis 12/18/2007   Formatting of this note might be different from the original. Thyroiditis   Hyperlipidemia    Iron deficiency anemia due to chronic blood loss 09/17/2020   Thyroid tumor, benign     Past Surgical History:  Procedure Laterality Date   BIOPSY THYROID     X2   CESAREAN SECTION     x 1   COLON SURGERY     FOOT FRACTURE SURGERY Left 02/2015   INCISIONAL HERNIA REPAIR     NASAL SEPTUM SURGERY     PARTIAL HYSTERECTOMY     POLYPECTOMY     UPPER GASTROINTESTINAL ENDOSCOPY     WRIST GANGLION EXCISION     left    Social History   Socioeconomic History   Marital status: Married    Spouse name: Not on file   Number of children: 2   Years of education: Not on file   Highest education level: Not on file  Occupational History   Occupation: claims for [employer]    Employer: [employer]  Tobacco Use   Smoking status: Never   Smokeless tobacco: Never   Tobacco comments:    never used product  Vaping Use   Vaping Use: Never  used  Substance and Sexual Activity   Alcohol use: No    Alcohol/week: 0.0 standard drinks   Drug use: No   Sexual activity: Not on file  Other Topics Concern   Not on file  Social History Narrative   Not on file   Social Determinants of Health   Financial Resource Strain: Not on file  Food Insecurity: Not on file  Transportation Needs: Not on file  Physical Activity: Not on file  Stress: Not on file  Social Connections: Not on file  Intimate Partner Violence: Not on file    Outpatient Encounter Medications as of 03/24/2021  Medication Sig   albuterol (PROVENTIL HFA;VENTOLIN HFA) 108 (90 BASE) MCG/ACT inhaler Inhale 1 puff into the lungs every 6 (six) hours as needed. For shortness for breath   ASMANEX HFA 200 MCG/ACT AERO INHALE 1 PUFF INTO THE LUNGS IN THE MORNING AND AT BEDTIME.   BLACK ELDERBERRY PO Take 1 tablet by mouth daily.   Cetirizine HCl 10 MG CAPS Take 10 mg  by mouth as needed.   cholecalciferol (VITAMIN D) 25 MCG (1000 UT) tablet Take 1,000 Units by mouth daily.   insulin glargine (LANTUS) 100 UNIT/ML injection Inject 60 Units into the skin daily.   metFORMIN (GLUCOPHAGE-XR) 500 MG 24 hr tablet Take 1,000 mg by mouth 2 (two) times daily.   RABEprazole (ACIPHEX) 20 MG tablet TAKE 1 TABLET BY MOUTH TWICE A DAY   Spacer/Aero-Holding Chambers (AEROCHAMBER PLUS WITH MASK) inhaler Use as directed   EPINEPHrine 0.3 mg/0.3 mL IJ SOAJ injection as needed. (Patient not taking: Reported on 03/23/2021)   hydrOXYzine (VISTARIL) 25 MG capsule Take 1 capsule (25 mg total) by mouth every 8 (eight) hours as needed. (Patient not taking: Reported on 03/24/2021)   No facility-administered encounter medications on file as of 03/24/2021.    Allergies  Allergen Reactions   Fluticasone-Salmeterol Palpitations   Morphine Palpitations and Other (See Comments)    REACTION: heart racing   Peanut Oil Other (See Comments) and Cough    congested   Peanut-Containing Drug Products Cough   Sulfa Antibiotics Other (See Comments)    REACTION: takes away appetite, makes her feel nervous, jittery   Other Other (See Comments)    Oats and Green peppers---headaches   Penicillins Rash   Sulfamethoxazole Nausea Only   Ciprofloxacin Anxiety   Clindamycin Rash   Cyclobenzaprine Other (See Comments)   Loracarbef Rash        Metronidazole Other (See Comments)   Moxifloxacin Hcl In Nacl Anxiety   Penicillin G Sodium Rash    Review of Systems  Constitutional:  Positive for fatigue. Negative for activity change, appetite change, chills, diaphoresis, fever and unexpected weight change.  Respiratory:  Negative for shortness of breath.   Cardiovascular:  Negative for chest pain, palpitations and leg swelling.  Gastrointestinal:  Negative for abdominal pain.  Genitourinary:  Negative for decreased urine volume.  Neurological:  Negative for dizziness and headaches.   Psychiatric/Behavioral:  Positive for sleep disturbance. The patient has insomnia.   All other systems reviewed and are negative.      Objective:  BP (!) 147/78    Pulse (!) 112    Temp 98.2 F (36.8 C) (Temporal)    Ht 5' 1"  (1.549 m)    Wt 64.5 kg    BMI 26.85 kg/m    Wt Readings from Last 3 Encounters:  03/24/21 64.5 kg  03/23/21 64.9 kg  02/27/21 61.7 kg    Physical Exam  Vitals and nursing note reviewed.  Constitutional:      General: She is not in acute distress.    Appearance: Normal appearance. She is obese. She is not ill-appearing, toxic-appearing or diaphoretic.  HENT:     Head: Normocephalic and atraumatic.     Mouth/Throat:     Mouth: Mucous membranes are moist.  Eyes:     Pupils: Pupils are equal, round, and reactive to light.  Cardiovascular:     Rate and Rhythm: Normal rate and regular rhythm.     Pulses: Normal pulses.     Heart sounds: Normal heart sounds.  Pulmonary:     Effort: Pulmonary effort is normal.     Breath sounds: Normal breath sounds.  Musculoskeletal:        General: Normal range of motion.     Cervical back: Normal range of motion.  Skin:    General: Skin is warm and dry.     Capillary Refill: Capillary refill takes less than 2 seconds.  Neurological:     General: No focal deficit present.     Mental Status: She is alert and oriented to person, place, and time. Mental status is at baseline.  Psychiatric:        Mood and Affect: Mood normal.        Behavior: Behavior normal.        Thought Content: Thought content normal.        Judgment: Judgment normal.    Results for orders placed or performed in visit on 03/23/21  CBC with Differential (Cancer Center Only)  Result Value Ref Range   WBC Count 8.7 4.0 - 10.5 K/uL   RBC 4.13 3.87 - 5.11 MIL/uL   Hemoglobin 12.8 12.0 - 15.0 g/dL   HCT 37.1 36.0 - 46.0 %   MCV 89.8 80.0 - 100.0 fL   MCH 31.0 26.0 - 34.0 pg   MCHC 34.5 30.0 - 36.0 g/dL   RDW 12.8 11.5 - 15.5 %   Platelet  Count 206 150 - 400 K/uL   nRBC 0.0 0.0 - 0.2 %   Neutrophils Relative % 54 %   Neutro Abs 4.8 1.7 - 7.7 K/uL   Lymphocytes Relative 33 %   Lymphs Abs 2.8 0.7 - 4.0 K/uL   Monocytes Relative 7 %   Monocytes Absolute 0.6 0.1 - 1.0 K/uL   Eosinophils Relative 4 %   Eosinophils Absolute 0.3 0.0 - 0.5 K/uL   Basophils Relative 1 %   Basophils Absolute 0.1 0.0 - 0.1 K/uL   Immature Granulocytes 1 %   Abs Immature Granulocytes 0.06 0.00 - 0.07 K/uL  CEA (IN HOUSE-[facility])  Result Value Ref Range   CEA ([facility]-In House) 1.42 0.00 - 5.00 ng/mL  CMP (Cancer Center only)  Result Value Ref Range   Sodium 136 135 - 145 mmol/L   Potassium 3.6 3.5 - 5.1 mmol/L   Chloride 101 98 - 111 mmol/L   CO2 22 22 - 32 mmol/L   Glucose, Bld 307 (H) 70 - 99 mg/dL   BUN 12 8 - 23 mg/dL   Creatinine 0.76 0.44 - 1.00 mg/dL   Calcium 9.6 8.9 - 10.3 mg/dL   Total Protein 7.0 6.5 - 8.1 g/dL   Albumin 4.2 3.5 - 5.0 g/dL   AST 39 15 - 41 U/L   ALT 58 (H) 0 - 44 U/L   Alkaline Phosphatase 100 38 - 126 U/L   Total Bilirubin 0.4 0.3 - 1.2 mg/dL   GFR,  Estimated >60 >60 mL/min   Anion gap 13 5 - 15  Lactate dehydrogenase  Result Value Ref Range   LDH 128 98 - 192 U/L  Ferritin  Result Value Ref Range   Ferritin 207 11 - 307 ng/mL  Iron and Iron Binding Capacity ([facility] only)  Result Value Ref Range   Iron 73 28 - 170 ug/dL   TIBC 343 250 - 450 ug/dL   Saturation Ratios 21 10.4 - 31.8 %   UIBC 270 148 - 442 ug/dL       Pertinent labs & imaging results that were available during my care of the patient were reviewed by me and considered in my medical decision making.  Assessment & Plan:  There are no diagnoses linked to this encounter.   Continue all other maintenance medications. Stephanie Carroll was seen today for anxiety and insomnia.  Diagnoses and all orders for this visit:  Primary insomnia Discussed with patient that she should trial the hydroxyzine again as she did not give a fair attempt given she was  already fatigued with COVID. Patient agreeable to trial again. Patient to return in 3 months.    Follow up plan: Return if symptoms worsen or fail to improve.   Continue healthy lifestyle choices, including diet (rich in fruits, vegetables, and lean proteins, and low in salt and simple carbohydrates) and exercise (at least 30 minutes of moderate physical activity daily).  Educational handout given for insomnia.   The above assessment and management plan was discussed with the patient. The patient verbalized understanding of and has agreed to the management plan. Patient is aware to call the clinic if they develop any new symptoms or if symptoms persist or worsen. Patient is aware when to return to the clinic for a follow-up visit. Patient educated on when it is appropriate to go to the emergency department.   Deidre Ala, NP-S  I personally was present during the history, physical exam, and medical decision-making activities of this visit and have verified that the services and findings are accurately documented in the nurse practitioner student's note.  Monia Pouch, FNP-C [facility] [address] [place], [place] [address] [phone]

## 2021-04-12 NOTE — Telephone Encounter (Signed)
Patient approved for Rabeprazole Sodium 20 mg from 03/12/2021-03/12/2022  Sent approval letter to be scanned in

## 2021-06-08 ENCOUNTER — Other Ambulatory Visit: Payer: Self-pay | Admitting: Gastroenterology

## 2021-06-14 ENCOUNTER — Telehealth: Payer: Self-pay | Admitting: Family Medicine

## 2021-06-14 NOTE — Telephone Encounter (Signed)
Ok, as I already see her other family.  ? ?Stephanie Dun, FNP ? ?

## 2021-06-15 NOTE — Telephone Encounter (Signed)
Patient scheduled with Evelina Dun, FNP.  Appointment with Monia Pouch, FNP cancelled ?

## 2021-06-22 ENCOUNTER — Ambulatory Visit: Payer: 59 | Admitting: Family Medicine

## 2021-07-02 ENCOUNTER — Other Ambulatory Visit: Payer: Self-pay | Admitting: Family

## 2021-07-02 ENCOUNTER — Encounter: Payer: Self-pay | Admitting: Family

## 2021-07-02 ENCOUNTER — Ambulatory Visit: Payer: 59 | Admitting: Family

## 2021-07-02 VITALS — BP 130/70 | HR 101 | Temp 97.2°F | Ht 61.0 in | Wt 144.8 lb

## 2021-07-02 DIAGNOSIS — E538 Deficiency of other specified B group vitamins: Secondary | ICD-10-CM | POA: Diagnosis not present

## 2021-07-02 DIAGNOSIS — E119 Type 2 diabetes mellitus without complications: Secondary | ICD-10-CM

## 2021-07-02 DIAGNOSIS — M7989 Other specified soft tissue disorders: Secondary | ICD-10-CM

## 2021-07-02 DIAGNOSIS — Z794 Long term (current) use of insulin: Secondary | ICD-10-CM | POA: Diagnosis not present

## 2021-07-02 NOTE — Progress Notes (Signed)
Subjective:    Patient ID: Stephanie Carroll, female    DOB: 02-Feb-1956, 66 y.o.   MRN: [id number]  Chief Complaint  Patient presents with   Swollen Glands    Under arms   Pt presents to the office today bilateral swelling in her axilla that has been coming and going for the last two months. States mild pain in right when it was swollen. She reports she had COVID in January and was sick on and off until March. She has also had mild congestion.  Diabetes She presents for her follow-up diabetic visit. She has type 2 diabetes mellitus. Pertinent negatives for hypoglycemia include no confusion. Pertinent negatives for diabetes include no blurred vision and no foot paresthesias. Symptoms are stable. Pertinent negatives for diabetic complications include no heart disease. Risk factors for coronary artery disease include dyslipidemia, diabetes mellitus, hypertension, sedentary lifestyle and post-menopausal. She is following a generally unhealthy diet. Her overall blood glucose range is 110-130 mg/dl.  Anemia Presents for follow-up visit. Symptoms include malaise/fatigue. There has been no confusion or pica.     Review of Systems  Constitutional:  Positive for malaise/fatigue.  Eyes:  Negative for blurred vision.  Psychiatric/Behavioral:  Negative for confusion.   All other systems reviewed and are negative.     Objective:   Physical Exam Vitals reviewed.  Constitutional:      General: She is not in acute distress.    Appearance: She is well-developed.  HENT:     Head: Normocephalic and atraumatic.     Right Ear: Tympanic membrane normal.     Left Ear: Tympanic membrane normal.  Eyes:     Pupils: Pupils are equal, round, and reactive to light.  Neck:     Thyroid: No thyromegaly.  Cardiovascular:     Rate and Rhythm: Normal rate and regular rhythm.     Heart sounds: Normal heart sounds. No murmur heard. Pulmonary:     Effort: Pulmonary effort is normal. No respiratory distress.      Breath sounds: Normal breath sounds. No wheezing.  Abdominal:     General: Bowel sounds are normal. There is no distension.     Palpations: Abdomen is soft.     Tenderness: There is no abdominal tenderness.  Musculoskeletal:        General: No tenderness. Normal range of motion.     Cervical back: Normal range of motion and neck supple.     Comments: Normal physical exam under bilateral axilla. No lymphadenopathy noted.  Fat present.   Skin:    General: Skin is warm and dry.  Neurological:     Mental Status: She is alert and oriented to person, place, and time.     Cranial Nerves: No cranial nerve deficit.     Deep Tendon Reflexes: Reflexes are normal and symmetric.  Psychiatric:        Behavior: Behavior normal.        Thought Content: Thought content normal.        Judgment: Judgment normal.      BP 130/70   Pulse (!) 101   Temp (!) 97.2 F (36.2 C)   Ht 5' 1"  (1.549 m)   Wt 144 lb 12.8 oz (65.7 kg)   SpO2 97%   BMI 27.36 kg/m      Assessment & Plan:  Stephanie Carroll comes in today with chief complaint of Swollen Glands (Under arms)   Diagnosis and orders addressed:  1. Type 2 diabetes mellitus without  complication, with long-term current use of insulin (HCC) Strict low carb diet Will do referral to Clinical Pharm for education  - AMB Referral to [facility] with Differential/Platelet  2. Axillary swelling Normal - CMP14+EGFR - CBC with Differential/Platelet  3. Vitamin B 12 deficiency Labs pending  - Vitamin B12   Labs pending Health Maintenance reviewed Diet and exercise encouraged  Follow up plan: 3 months    Evelina Dun, FNP

## 2021-07-02 NOTE — Patient Instructions (Signed)

## 2021-07-03 LAB — CBC WITH DIFFERENTIAL/PLATELET
Basophils Absolute: 0.1 10*3/uL (ref 0.0–0.2)
Basos: 1 %
EOS (ABSOLUTE): 0.2 10*3/uL (ref 0.0–0.4)
Eos: 3 %
Hematocrit: 39.8 % (ref 34.0–46.6)
Hemoglobin: 13.4 g/dL (ref 11.1–15.9)
Immature Grans (Abs): 0 10*3/uL (ref 0.0–0.1)
Immature Granulocytes: 0 %
Lymphocytes Absolute: 2.6 10*3/uL (ref 0.7–3.1)
Lymphs: 34 %
MCH: 30.2 pg (ref 26.6–33.0)
MCHC: 33.7 g/dL (ref 31.5–35.7)
MCV: 90 fL (ref 79–97)
Monocytes Absolute: 0.4 10*3/uL (ref 0.1–0.9)
Monocytes: 6 %
Neutrophils Absolute: 4.3 10*3/uL (ref 1.4–7.0)
Neutrophils: 56 %
Platelets: 212 10*3/uL (ref 150–450)
RBC: 4.43 x10E6/uL (ref 3.77–5.28)
RDW: 12.3 % (ref 11.7–15.4)
WBC: 7.7 10*3/uL (ref 3.4–10.8)

## 2021-07-03 LAB — CMP14+EGFR
ALT: 69 IU/L — ABNORMAL HIGH (ref 0–32)
AST: 58 IU/L — ABNORMAL HIGH (ref 0–40)
Albumin/Globulin Ratio: 1.6 (ref 1.2–2.2)
Albumin: 4.6 g/dL (ref 3.8–4.8)
Alkaline Phosphatase: 91 IU/L (ref 44–121)
BUN/Creatinine Ratio: 17 (ref 12–28)
BUN: 11 mg/dL (ref 8–27)
Bilirubin Total: 0.3 mg/dL (ref 0.0–1.2)
CO2: 20 mmol/L (ref 20–29)
Calcium: 9.9 mg/dL (ref 8.7–10.3)
Chloride: 101 mmol/L (ref 96–106)
Creatinine, Ser: 0.66 mg/dL (ref 0.57–1.00)
Globulin, Total: 2.9 g/dL (ref 1.5–4.5)
Glucose: 188 mg/dL — ABNORMAL HIGH (ref 70–99)
Potassium: 4.5 mmol/L (ref 3.5–5.2)
Sodium: 141 mmol/L (ref 134–144)
Total Protein: 7.5 g/dL (ref 6.0–8.5)
eGFR: 99 mL/min/{1.73_m2} (ref >59)

## 2021-07-03 LAB — VITAMIN B12: Vitamin B-12: 602 pg/mL (ref 232–1245)

## 2021-07-05 ENCOUNTER — Ambulatory Visit (INDEPENDENT_AMBULATORY_CARE_PROVIDER_SITE_OTHER): Payer: 59 | Admitting: Emergency Medicine

## 2021-07-05 DIAGNOSIS — Z23 Encounter for immunization: Secondary | ICD-10-CM

## 2021-07-20 ENCOUNTER — Ambulatory Visit: Payer: 59 | Admitting: Pharmacist

## 2021-07-20 DIAGNOSIS — E119 Type 2 diabetes mellitus without complications: Secondary | ICD-10-CM | POA: Diagnosis not present

## 2021-07-20 DIAGNOSIS — Z794 Long term (current) use of insulin: Secondary | ICD-10-CM | POA: Diagnosis not present

## 2021-07-20 MED ORDER — TRULICITY 0.75 MG/0.5ML ~~LOC~~ SOAJ: 0.7500 mg | SUBCUTANEOUS | 0 refills | Status: DC

## 2021-07-20 NOTE — Progress Notes (Signed)
    07/20/2021 Name: Stephanie Carroll MRN: [id number] DOB: [birth year]   S:  67 yoF Presents for diabetes evaluation, education, and management.  Patient was referred and last seen by Primary Care Provider on 07/02/21 Patient reports Diabetes was diagnosed in 2010.  She has been on insulin therapy for 5-6 years.  She has a high deductible plan which may be challenging to get additional medications.  She reports she gets insulin via [employer] patient assistance program.  Insurance coverage/medication affordability: [employer] health plan  Patient reports adherence with medications. Current diabetes medications include: lantus, metformin Current hypertension medications include: n/a Goal 130/80 Current hyperlipidemia medications include: n/a--discussed--need recent lipid panel (ordered)    Patient denies hypoglycemic events.   Patient reported dietary habits: Eats 3 meals/day Discussed eating 40-45 carbs per meal x3 meals daily Reviewed healthy plate handout Patient drinking more water Exercising more Occasional soda (regular) Limits sweets  Patient-reported exercise habits: increased exercise --walking   O: Most recent a1c--patient reported  8.2% from outside facility    Lipid Panel     Component Value Date/Time   CHOL 211 (H) 03/08/2019 1603   TRIG 299.0 (H) 03/08/2019 1603   HDL 33.60 (L) 03/08/2019 1603   CHOLHDL 6 03/08/2019 1603   VLDL 59.8 (H) 03/08/2019 1603   LDLDIRECT 142.0 03/08/2019 1603     Home fasting blood sugars: 116 this AM  2 hour post-meal/random blood sugars: n/a.    Clinical Atherosclerotic Cardiovascular Disease (ASCVD): No   The 10-year ASCVD risk score (Arnett DK, et al., 2019) is: 12.3%   Values used to calculate the score:     Age: 12 years     Sex: Female     Is Non-Hispanic African American: No     Diabetic: Yes     Tobacco smoker: No     Systolic Blood Pressure: 106 mmHg     Is BP treated: No     HDL Cholesterol: 33.6 mg/dL     Total  Cholesterol: 202 mg/dL    A/P:  Diabetes t2dm currently uncontrolled--a1c 8.2%.   -Continue lantus 62 units and metformin  -would like to add GLP1 therapy--will see if covered  Discussed trulicity   Denies personal and family history of Medullary thyroid cancer ([place])  Copay card given  Will reduce insulin at that time as needed  -Extensively discussed pathophysiology of diabetes, recommended lifestyle interventions, dietary effects on blood sugar control  -Counseled on s/sx of and management of hypoglycemia  -Next A1C anticipated 3 months.    Written patient instructions provided.  Total time in face to face counseling 25 minutes.     Regina Eck, PharmD, BCPS Clinical Pharmacist, [place]  [facility] Phone [phone]

## 2021-07-22 ENCOUNTER — Telehealth: Payer: Self-pay | Admitting: Family

## 2021-07-22 NOTE — Telephone Encounter (Signed)
Patient has questions about diabetes medicines. No other information given. Would like to speak with Almyra Free. Please call back.

## 2021-07-22 NOTE — Telephone Encounter (Signed)
Patient would like to wait and start trulicity Based on current GI issues [facility]

## 2021-07-22 NOTE — Telephone Encounter (Signed)
Patient wants to be called back at [phone]

## 2021-07-26 ENCOUNTER — Encounter: Payer: Self-pay | Admitting: Nurse Practitioner

## 2021-07-26 ENCOUNTER — Ambulatory Visit: Payer: 59 | Admitting: Nurse Practitioner

## 2021-07-26 ENCOUNTER — Ambulatory Visit (INDEPENDENT_AMBULATORY_CARE_PROVIDER_SITE_OTHER): Payer: 59

## 2021-07-26 VITALS — BP 140/80 | HR 111 | Temp 98.6°F | Ht 61.0 in | Wt 143.0 lb

## 2021-07-26 DIAGNOSIS — K219 Gastro-esophageal reflux disease without esophagitis: Secondary | ICD-10-CM | POA: Diagnosis not present

## 2021-07-26 DIAGNOSIS — R1012 Left upper quadrant pain: Secondary | ICD-10-CM

## 2021-07-26 NOTE — Patient Instructions (Signed)
Abdominal Pain, Adult Many things can cause belly (abdominal) pain. Most times, belly pain is not dangerous. Many cases of belly pain can be watched and treated at home. Sometimes, though, belly pain is serious. Your doctor will try to find the cause of your belly pain. Follow these instructions at home:  Medicines Take over-the-counter and prescription medicines only as told by your doctor. Do not take medicines that help you poop (laxatives) unless told by your doctor. General instructions Watch your belly pain for any changes. Drink enough fluid to keep your pee (urine) pale yellow. Keep all follow-up visits as told by your doctor. This is important. Contact a doctor if: Your belly pain changes or gets worse. You are not hungry, or you lose weight without trying. You are having trouble pooping (constipated) or have watery poop (diarrhea) for more than 2-3 days. You have pain when you pee or poop. Your belly pain wakes you up at night. Your pain gets worse with meals, after eating, or with certain foods. You are vomiting and cannot keep anything down. You have a fever. You have blood in your pee. Get help right away if: Your pain does not go away as soon as your doctor says it should. You cannot stop vomiting. Your pain is only in areas of your belly, such as the right side or the left lower part of the belly. You have bloody or black poop, or poop that looks like tar. You have very bad pain, cramping, or bloating in your belly. You have signs of not having enough fluid or water in your body (dehydration), such as: Dark pee, very little pee, or no pee. Cracked lips. Dry mouth. Sunken eyes. Sleepiness. Weakness. You have trouble breathing or chest pain. Summary Many cases of belly pain can be watched and treated at home. Watch your belly pain for any changes. Take over-the-counter and prescription medicines only as told by your doctor. Contact a doctor if your belly pain  changes or gets worse. Get help right away if you have very bad pain, cramping, or bloating in your belly. This information is not intended to replace advice given to you by your health care provider. Make sure you discuss any questions you have with your health care provider. Document Revised: 06/11/2018 Document Reviewed: 06/11/2018 Elsevier Patient Education  [place].

## 2021-07-26 NOTE — Progress Notes (Signed)
Acute Office Visit  Subjective:     Patient ID: Stephanie Carroll, female    DOB: March 05, 1955, 66 y.o.   MRN: [id number]  Chief Complaint  Patient presents with   Gastroesophageal Reflux   Nasal Congestion    Gastroesophageal Reflux She complains of abdominal pain, choking and heartburn. She reports no belching, no coughing, no nausea or no sore throat. This is a recurrent problem. The current episode started 1 to 4 weeks ago. The problem occurs constantly. The problem has been gradually worsening. The heartburn duration is less than a minute. The heartburn is located in the substernum. The heartburn is of severe intensity. The heartburn does not wake her from sleep. The heartburn does not limit her activity. Nothing aggravates the symptoms. Risk factors: Abdominal hernia. She has tried a PPI and a histamine-2 antagonist for the symptoms. The treatment provided mild relief.  Abdominal Pain This is a new problem. The current episode started yesterday. The onset quality is gradual. The problem occurs constantly. The problem has been unchanged. The pain is located in the LUQ. The pain is at a severity of 4/10. The pain is mild. The quality of the pain is aching. The abdominal pain does not radiate. Pertinent negatives include no belching, constipation, fever or nausea. Nothing aggravates the pain. The pain is relieved by Nothing. Her past medical history is significant for GERD.    Review of Systems  Constitutional: Negative.  Negative for chills and fever.  HENT: Negative.  Negative for sore throat.   Respiratory:  Positive for choking. Negative for cough.   Cardiovascular: Negative.   Gastrointestinal:  Positive for abdominal pain and heartburn. Negative for constipation and nausea.  Skin: Negative.  Negative for rash.  All other systems reviewed and are negative.       Objective:    BP 140/80   Pulse (!) 111   Temp 98.6 F (37 C)   Ht 5' 1"  (1.549 m)   Wt 143 lb (64.9 kg)   SpO2  96%   BMI 27.02 kg/m  BP Readings from Last 3 Encounters:  07/26/21 140/80  07/02/21 130/70  03/24/21 (!) 147/78   Wt Readings from Last 3 Encounters:  07/26/21 143 lb (64.9 kg)  07/02/21 144 lb 12.8 oz (65.7 kg)  03/24/21 142 lb 2 oz (64.5 kg)      Physical Exam Vitals and nursing note reviewed.  Constitutional:      Appearance: Normal appearance.  HENT:     Head: Normocephalic.     Right Ear: Ear canal and external ear normal.     Left Ear: Ear canal and external ear normal.     Nose: Nose normal.     Mouth/Throat:     Mouth: Mucous membranes are moist.     Pharynx: Oropharynx is clear.  Eyes:     Conjunctiva/sclera: Conjunctivae normal.  Cardiovascular:     Rate and Rhythm: Normal rate and regular rhythm.     Pulses: Normal pulses.     Heart sounds: Normal heart sounds.  Pulmonary:     Effort: Pulmonary effort is normal.     Breath sounds: Normal breath sounds.  Abdominal:     General: Bowel sounds are normal.     Tenderness: There is abdominal tenderness.  Skin:    General: Skin is warm.     Findings: No rash.  Neurological:     General: No focal deficit present.     Mental Status: She is alert and oriented  to person, place, and time.  Psychiatric:        Behavior: Behavior normal.     No results found for any visits on 07/26/21.      Assessment & Plan:  Patient is reporting uncontrolled GERD and abdominal pain in the past few days. Patient see a gastroenterologist and was seen last October. Patient is going to make an appointment to be reassessed by gastroenterologist and continuing current medication.  Completed abdominal x-ray to address LUQ pain.   Advised patient to seat up after meals, -take medication as prescribed.  -tylenol for pain and fever -follow up with worsening unresolved symptoms    Problem List Items Addressed This Visit   None Visit Diagnoses     Left upper quadrant abdominal pain    -  Primary   Relevant Orders   DG Abd 2  Views   Gastroesophageal reflux disease without esophagitis           No orders of the defined types were placed in this encounter.   Return if symptoms worsen or fail to improve.  Ivy Lynn, NP

## 2021-07-27 ENCOUNTER — Telehealth: Payer: Self-pay | Admitting: Gastroenterology

## 2021-07-27 NOTE — Telephone Encounter (Signed)
Patient called requesting to speak with a nurse regarding having black stool again.

## 2021-07-27 NOTE — Telephone Encounter (Signed)
Spoke with the patient. She reports off and on black stool for the past week. Has had a sensation of being short of breath but attributes this to her asthma. She reports her stomach has been hurting. She is on Aciphex. In the past when her stomach hurt, she could take Pepcid and she would improve. States it is not working this time. She is having urgent diarrhea. Does not understand why the abdominal xray done by her PCP on 07/26/21 shows large amount of stool in the colon. Patient is using a heating pad to her stomach for the abdominal discomfort. Appointment scheduled with first available APP on 08/09/21, Amy [place], PA.  Do you have recommendations until the appointment?

## 2021-07-30 NOTE — Telephone Encounter (Signed)
The patient is advised. Miralax purge explained to the patient. She agrees to this plan.

## 2021-07-30 NOTE — Telephone Encounter (Signed)
Agree with office visit as scheduled to further evaluate her symptoms and manage appropriately.  Please advise patient to do purge with MiraLAX if she is able to tolerate it to decrease the stool burden and improve some of her symptoms.  Thank you

## 2021-08-09 ENCOUNTER — Other Ambulatory Visit (INDEPENDENT_AMBULATORY_CARE_PROVIDER_SITE_OTHER): Payer: 59

## 2021-08-09 ENCOUNTER — Encounter: Payer: Self-pay | Admitting: Physician Assistant

## 2021-08-09 ENCOUNTER — Ambulatory Visit (INDEPENDENT_AMBULATORY_CARE_PROVIDER_SITE_OTHER): Payer: 59 | Admitting: Physician Assistant

## 2021-08-09 VITALS — BP 140/60 | HR 96 | Ht 60.25 in | Wt 145.0 lb

## 2021-08-09 DIAGNOSIS — K921 Melena: Secondary | ICD-10-CM

## 2021-08-09 DIAGNOSIS — K760 Fatty (change of) liver, not elsewhere classified: Secondary | ICD-10-CM | POA: Diagnosis not present

## 2021-08-09 DIAGNOSIS — Z85038 Personal history of other malignant neoplasm of large intestine: Secondary | ICD-10-CM

## 2021-08-09 DIAGNOSIS — R1011 Right upper quadrant pain: Secondary | ICD-10-CM

## 2021-08-09 LAB — CBC WITH DIFFERENTIAL/PLATELET
Basophils Absolute: 0 10*3/uL (ref 0.0–0.1)
Basophils Relative: 0.5 % (ref 0.0–3.0)
Eosinophils Absolute: 0.3 10*3/uL (ref 0.0–0.7)
Eosinophils Relative: 3.3 % (ref 0.0–5.0)
HCT: 39.1 % (ref 36.0–46.0)
Hemoglobin: 13.1 g/dL (ref 12.0–15.0)
Lymphocytes Relative: 30.3 % (ref 12.0–46.0)
Lymphs Abs: 2.4 10*3/uL (ref 0.7–4.0)
MCHC: 33.6 g/dL (ref 30.0–36.0)
MCV: 90.4 fl (ref 78.0–100.0)
Monocytes Absolute: 0.5 10*3/uL (ref 0.1–1.0)
Monocytes Relative: 6.8 % (ref 3.0–12.0)
Neutro Abs: 4.7 10*3/uL (ref 1.4–7.7)
Neutrophils Relative %: 59.1 % (ref 43.0–77.0)
Platelets: 201 10*3/uL (ref 150.0–400.0)
RBC: 4.33 Mil/uL (ref 3.87–5.11)
RDW: 13.4 % (ref 11.5–15.5)
WBC: 8 10*3/uL (ref 4.0–10.5)

## 2021-08-09 LAB — HEPATIC FUNCTION PANEL
ALT: 49 U/L — ABNORMAL HIGH (ref 0–35)
AST: 36 U/L (ref 0–37)
Albumin: 4.4 g/dL (ref 3.5–5.2)
Alkaline Phosphatase: 78 U/L (ref 39–117)
Bilirubin, Direct: 0.1 mg/dL (ref 0.0–0.3)
Total Bilirubin: 0.4 mg/dL (ref 0.2–1.2)
Total Protein: 7.1 g/dL (ref 6.0–8.3)

## 2021-08-09 NOTE — Progress Notes (Signed)
Subjective:    Patient ID: Stephanie Carroll, female    DOB: 12/16/1955, 66 y.o.   MRN: [id number]  HPI Stephanie Carroll is a pleasant 66 year old white female, established with Dr. Lavon Paganini.  She has history of GERD, IBS, nonalcoholic fatty liver disease, personal history of colon cancer stage I 2008 status postresection, prior hernia repair with mesh, hypertension, adult onset diabetes mellitus, hyperlipidemia. She comes in today with concerns about recent black stools which she had been noticing over the past month.  She says that her stools have been brown in color over the past few days.  He had not noticed any bright red blood or clots.  On further questioning she has been taking Pepto-Bismol intermittently for abdominal discomfort and indigestion.  She mentions that she has been having some right upper quadrant abdominal pain off and on over the past couple of months.  She had also started a nutritional supplement type of drink for weight loss about a month ago and wonders if that may have changed her bowels, she has stopped using that.  He is not using any regular aspirin or NSAIDs.  She is on Aciphex chronically twice daily. She had EGD in October 2022 with finding of a 20 mm mucosal papule (nodule) in the gastric antrum and was noted to have moderate gastritis.  Biopsy of the nodule showed an inflammatory gastric polyp/hyperplastic with ulceration, no H. pylori.  She also had colonoscopy done at that same time for evaluation of iron deficiency anemia and was found to have 2 small polyps, 1 was hyperplastic and the other was lymphoid aggregate, also with few diverticuli and nonbleeding internal and external hemorrhoids.  There was a patent end-to-side anastomosis.   She had undergone iron replacement through oncology and last iron studies in February 2023 ferritin 181 Most recent labs May 2023 WBC 7.7 hemoglobin 13.4/hematocrit 39.8  She had plain abdominal films done through her PCP on 07/26/2021 which  showed a large amount of stool throughout the colon.  She has been having regular bowel movements.  Review of Systems Pertinent positive and negative review of systems were noted in the above HPI section.  All other review of systems was otherwise negative.   Outpatient Encounter Medications as of 08/09/2021  Medication Sig   albuterol (PROVENTIL HFA;VENTOLIN HFA) 108 (90 BASE) MCG/ACT inhaler Inhale 1 puff into the lungs every 6 (six) hours as needed. For shortness for breath   ASMANEX HFA 200 MCG/ACT AERO INHALE 1 PUFF INTO THE LUNGS IN THE MORNING AND AT BEDTIME.   BLACK ELDERBERRY PO Take 1 tablet by mouth daily.   Cetirizine HCl 10 MG CAPS Take 10 mg by mouth as needed.   cholecalciferol (VITAMIN D) 25 MCG (1000 UT) tablet Take 1,000 Units by mouth daily.   insulin glargine (LANTUS) 100 UNIT/ML injection Inject 57 Units into the skin daily.   levalbuterol (XOPENEX) 1.25 MG/3ML nebulizer solution Take 1.25 mg by nebulization as needed for wheezing.   metFORMIN (GLUCOPHAGE-XR) 500 MG 24 hr tablet Take 1,000 mg by mouth 2 (two) times daily.   RABEprazole (ACIPHEX) 20 MG tablet TAKE 1 TABLET BY MOUTH TWICE A DAY   Spacer/Aero-Holding Chambers (AEROCHAMBER PLUS WITH MASK) inhaler Use as directed   EPINEPHrine 0.3 mg/0.3 mL IJ SOAJ injection as needed. (Patient not taking: Reported on 03/23/2021)   hydrOXYzine (VISTARIL) 25 MG capsule Take 1 capsule (25 mg total) by mouth every 8 (eight) hours as needed. (Patient not taking: Reported on 03/24/2021)   [DISCONTINUED]  Dulaglutide (TRULICITY) 0.75 MG/0.5ML SOPN Inject 0.75 mg into the skin once a week. DX: E11.65   [DISCONTINUED] metFORMIN (GLUCOPHAGE) 500 MG tablet Take 1,000 mg by mouth 2 (two) times daily.   No facility-administered encounter medications on file as of 08/09/2021.   Allergies  Allergen Reactions   Fluticasone-Salmeterol Palpitations   Morphine Palpitations and Other (See Comments)    REACTION: heart racing   Peanut Oil Other (See  Comments) and Cough    congested   Peanut-Containing Drug Products Cough   Sulfa Antibiotics Other (See Comments)    REACTION: takes away appetite, makes her feel nervous, jittery   Other Other (See Comments)    Oats and Green peppers---headaches   Penicillins Rash   Sulfamethoxazole Nausea Only   Ciprofloxacin Anxiety   Clindamycin Rash   Cyclobenzaprine Other (See Comments)   Loracarbef Rash        Metronidazole Other (See Comments)   Moxifloxacin Hcl In Nacl Anxiety   Penicillin G Sodium Rash   Patient Active Problem List   Diagnosis Date Noted   Diabetic retinopathy (HCC) 02/24/2021   Vitamin B12 deficiency 02/24/2021   GAD (generalized anxiety disorder) 02/24/2021   Hypertension associated with type 2 diabetes mellitus (HCC) 10/23/2020   Iron deficiency anemia due to chronic blood loss 09/17/2020   Chronic asthma, mild persistent, uncomplicated 08/25/2020   Carpal tunnel syndrome of right wrist 11/06/2017   Cervical radiculopathy 10/20/2017   DDD (degenerative disc disease), cervical 10/20/2017   Acute bronchiolitis, unspecified 03/23/2015   Environmental allergies 03/23/2015   Hand arthritis 03/23/2015   Multiple joint pain 03/23/2015   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   Type 2 diabetes mellitus without complication, with long-term current use of insulin (HCC) 03/23/2015   Internal hemorrhoids 06/30/2014   Non-alcoholic fatty liver disease 11/27/2013   Rectal bleeding 09/20/2013   Personal history of colon cancer, stage I 09/20/2013   Vitamin D deficiency 12/19/2008   History of malignant neoplasm of large intestine 08/04/2008   Hyperlipidemia associated with type 2 diabetes mellitus (HCC) 06/19/2008   Irritable bowel syndrome 03/19/2008   Diaphragmatic hernia 12/18/2007   Gastro-esophageal reflux disease without esophagitis 06/06/2007   Migraine headache 02/12/2007   Social History   Socioeconomic History   Marital status: Married    Spouse name: Not on file    Number of children: 2   Years of education: Not on file   Highest education level: Not on file  Occupational History   Occupation: claims for [employer]    Employer: [employer]  Tobacco Use   Smoking status: Never   Smokeless tobacco: Never   Tobacco comments:    never used product  Vaping Use   Vaping Use: Never used  Substance and Sexual Activity   Alcohol use: No    Alcohol/week: 0.0 standard drinks of alcohol   Drug use: No   Sexual activity: Not on file  Other Topics Concern   Not on file  Social History Narrative   Not on file   Social Determinants of Health   Financial Resource Strain: Not on file  Food Insecurity: Not on file  Transportation Needs: Not on file  Physical Activity: Not on file  Stress: Not on file  Social Connections: Not on file  Intimate Partner Violence: Not on file    Ms. Siess's family history includes Breast cancer in her mother; COPD in an other family member; Cervical cancer in her paternal aunt; Diabetes in an other family member; Heart defect in her mother;  Heart disease in her father; Kidney failure in her father; Leukemia in her paternal grandfather; Lung cancer in her paternal uncle.      Objective:    Vitals:   08/09/21 0951  BP: 140/60  Pulse: 96    Physical Exam Well-developed well-nourished older WF  in no acute distress.  Height, Weight,145 BMI 28.0  HEENT; nontraumatic normocephalic, EOMI, PE R LA, sclera anicteric. Oropharynx; not examined today Neck; supple, no JVD Cardiovascular; regular rate and rhythm with S1-S2, no murmur rub or gallop Pulmonary; Clear bilaterally Abdomen; soft, nontender, nondistended, no palpable mass or hepatosplenomegaly, bowel sounds are active Rectal; scant stool in the rectal vault, heme-negative Skin; benign exam, no jaundice rash or appreciable lesions Extremities; no clubbing cyanosis or edema skin warm and dry Neuro/Psych; alert and oriented x4, grossly nonfocal mood and affect appropriate         Assessment & Plan:   #23 66 year old white female with recent intermittent black stool over the past month.  Stool is heme-negative today and patient relates stool brown over the past few days. She has been taking Pepto-Bismol intermittently for abdominal discomfort which is likely the cause for the black appearing stools.  She does have history of a 20 mm mucosal nodule found at EGD October 2022 in the gastric antrum which the path was found to be an inflammatory gastric polyp with ulceration, biopsies negative for H. pylori.  At that time patient was undergoing evaluation for iron deficiency anemia.  Plan was to repeat EGD if she did not have resolution of the iron deficiency and anemia. Most recent blood counts have been very good and most recent ferritin February 2023 181  #2 intermittent right upper quadrant pain-rule out gallbladder disease  #3 remote history of colon cancer stage I 2008 status postresection-up-to-date with colonoscopy last done October 2022.  1 hyperplastic polyp found.  Indicated for 5-year interval follow-up #4 diverticulosis #5 history of abdominal wall hernia status postrepair with mesh #6 adult onset diabetes mellitus 7.  Hypertension #8 history of iron deficiency anemia-most recent labs May 2023 with normal hemoglobin and ferritin had normalized as of February 2023  Plan; CBC today, hepatic panel (reviewing labs most recent LFTs mildly elevated) Schedule for upper abdominal ultrasound Continue Aciphex 20 mg p.o. twice daily Patient advised to stop using Pepto-Bismol, and also advised her to stay off of the nutritional supplement drink in case this was causing some dyspeptic symptoms She will notify us for any recurrence of black stool.  I do not think she needs EGD currently. Further recommendations pending results of labs and ultrasound  Raymond Azure Oswald Hillock PA-C 08/09/2021   Cc: Junie Spencer, FNP

## 2021-08-10 ENCOUNTER — Ambulatory Visit: Payer: 59 | Admitting: Pharmacist

## 2021-08-16 ENCOUNTER — Ambulatory Visit (HOSPITAL_COMMUNITY)
Admission: RE | Admit: 2021-08-16 | Discharge: 2021-08-16 | Disposition: A | Discharge status: Home / Self Care | Payer: 59 | Source: Ambulatory Visit | Attending: Physician Assistant | Admitting: Physician Assistant

## 2021-08-16 DIAGNOSIS — K921 Melena: Secondary | ICD-10-CM | POA: Diagnosis present

## 2021-08-16 DIAGNOSIS — R1011 Right upper quadrant pain: Secondary | ICD-10-CM | POA: Diagnosis present

## 2021-08-16 DIAGNOSIS — Z85038 Personal history of other malignant neoplasm of large intestine: Secondary | ICD-10-CM | POA: Insufficient documentation

## 2021-08-16 DIAGNOSIS — K760 Fatty (change of) liver, not elsewhere classified: Secondary | ICD-10-CM | POA: Diagnosis present

## 2021-08-20 ENCOUNTER — Ambulatory Visit: Payer: 59 | Admitting: Internal Medicine

## 2021-08-20 ENCOUNTER — Encounter: Payer: Self-pay | Admitting: Internal Medicine

## 2021-08-20 DIAGNOSIS — J4530 Mild persistent asthma, uncomplicated: Secondary | ICD-10-CM | POA: Diagnosis not present

## 2021-08-20 MED ORDER — ASMANEX HFA 200 MCG/ACT IN AERO: 1.0000 | INHALATION_SPRAY | Freq: Two times a day (BID) | RESPIRATORY_TRACT | 11 refills | Status: DC

## 2021-08-20 MED ORDER — ALBUTEROL SULFATE HFA 108 (90 BASE) MCG/ACT IN AERS: 1.0000 | INHALATION_SPRAY | RESPIRATORY_TRACT | 11 refills | Status: AC | PRN

## 2021-08-20 NOTE — Assessment & Plan Note (Addendum)
Onset in childhood, recurred in her 67s  - allergy testing around 2000 in [place]   pine trees, grass, pollen  - 08/25/2020  After extensive coaching inhaler device,  effectiveness =   50 % with spacer (Ti too short/ poor coordination with trigger then breathe   - 08/20/2021 did not bring inhalers as req for training but doing well > f/u yearly, sooner prn    All goals of chronic asthma control met including optimal function and elimination of symptoms with minimal need for rescue therapy.  Contingencies discussed in full including contacting this office immediately if not controlling the symptoms using the rule of two's.      F/u yearly, sooner prn          Each maintenance medication was reviewed in detail including emphasizing most importantly the difference between maintenance and prns and under what circumstances the prns are to be triggered using an action plan format where appropriate.  Total time for H and P, chart review, counseling, reviewing hfa device(s) and generating customized AVS unique to this office visit / same day charting = 25 min

## 2021-08-20 NOTE — Progress Notes (Signed)
Stephanie Carroll, female    DOB: 1955-04-09,    MRN: [id number]   Brief patient profile:  89  yowf never smoker retired from [employer] doing [employer] x decades referred to pulmonary clinic in [place]  08/25/2020 by Edrick Kins PA for asthma first developed as a child but no memory of it and recurred in her 33s requiring freq pred but since started on asmanex 200 one bid feels it's been better controlled and main issue is access thru her insurance     History of Present Illness  08/25/2020  Pulmonary/ 1st office eval/ [facility] / [facility]  Chief Complaint  Patient presents with   Pulmonary Consult    Referred by Edrick Kins, PA for eval of Asthma. Pt states she was dx with Asthma in the 1990's. She states doing well currently, and has not had any symptoms in the past 10-15 years since started taking Asmanex.   Dyspnea:  Not limited by breathing from desired activities  / walks 30 min daily in house more limited by feet than breathing Cough: none Sleep: bed flat / sev big pillows  SABA use: on exp to smoke / has levoalbuterol very rarely needs  Rec Plan A = Automatic = Always=    Asmanex 200 1-2 first thing in am and repeat this in pm  (if worse symptoms use the full dose of 2 puffs every 12 hours for a full week)  We will send rx for new spacer to your pharmacy  Plan B = Backup (to supplement plan A, not to replace it) Only use your albuterol inhaler as a rescue medication Plan C = Crisis (instead of Plan B but only if Plan B stops working)  - only use your albuterol nebulizer if you first try Plan B and it fails to help  Please schedule a follow up visit in 12 months but call sooner if needed  with all medications /inhalers/ solutions in hand    2nd Covid Jan 2023 rx with paxlovid but not admitted rx prednisone/increased saba    08/20/2021  f/u ov/[place] office/Rosaly Labarbera re: asthma  maint on asmanex 200 1-2 bid   Chief Complaint  Patient presents with   Follow-up    Feels asthma  is controlled. Would like refills on rescue inhaler.   Dyspnea:  baseline denies sob but not exercising yet Cough: none  Sleeping: no resp cc wedge under mattress/ 2 pillow  SABA use: none now 02: none  Covid status: never vax     No obvious day to day or daytime variability or assoc excess/ purulent sputum or mucus plugs or hemoptysis or cp or chest tightness, subjective wheeze or overt sinus or hb symptoms.   Sleeping  without nocturnal  or early am exacerbation  of respiratory  c/o's or need for noct saba. Also denies any obvious fluctuation of symptoms with weather or environmental changes or other aggravating or alleviating factors except as outlined above   No unusual exposure hx or h/o childhood pna or knowledge of premature birth.  Current Allergies, Complete Past Medical History, Past Surgical History, Family History, and Social History were reviewed in [employer] record.  ROS  The following are not active complaints unless bolded Hoarseness, sore throat, dysphagia, dental problems, itching, sneezing,  nasal congestion or discharge of excess mucus or purulent secretions, ear ache,   fever, chills, sweats, unintended wt loss or wt gain, classically pleuritic or exertional cp,  orthopnea pnd or arm/hand swelling  or leg swelling, presyncope, palpitations, abdominal pain, anorexia, nausea, vomiting, diarrhea  or change in bowel habits or change in bladder habits, change in stools or change in urine, dysuria, hematuria,  rash, arthralgias, visual complaints, headache, numbness, weakness or ataxia or problems with walking or coordination,  change in mood or  memory.        Current Meds  Medication Sig   albuterol (PROVENTIL HFA;VENTOLIN HFA) 108 (90 BASE) MCG/ACT inhaler Inhale 1 puff into the lungs every 6 (six) hours as needed. For shortness for breath   ASMANEX HFA 200 MCG/ACT AERO INHALE 1 PUFF INTO THE LUNGS IN THE MORNING AND AT BEDTIME.   BLACK ELDERBERRY  PO Take 1 tablet by mouth daily.   Cetirizine HCl 10 MG CAPS Take 10 mg by mouth as needed.   cholecalciferol (VITAMIN D) 25 MCG (1000 UT) tablet Take 1,000 Units by mouth daily.   EPINEPHrine 0.3 mg/0.3 mL IJ SOAJ injection as needed.   hydrOXYzine (VISTARIL) 25 MG capsule Take 1 capsule (25 mg total) by mouth every 8 (eight) hours as needed.   insulin glargine (LANTUS) 100 UNIT/ML injection Inject 57 Units into the skin daily.   levalbuterol (XOPENEX) 1.25 MG/3ML nebulizer solution Take 1.25 mg by nebulization as needed for wheezing.   metFORMIN (GLUCOPHAGE-XR) 500 MG 24 hr tablet Take 1,000 mg by mouth 2 (two) times daily.   RABEprazole (ACIPHEX) 20 MG tablet TAKE 1 TABLET BY MOUTH TWICE A DAY   Spacer/Aero-Holding Chambers (AEROCHAMBER PLUS WITH MASK) inhaler Use as directed                    .  Past Medical History:  Diagnosis Date   Allergy    Arthritis    DDD   Asthma    Colon cancer ([place]) 2008   Diabetes mellitus    Elevated liver enzymes    GERD (gastroesophageal reflux disease)    Hyperlipidemia    Thyroid tumor, benign        Objective:     Wt Readings from Last 3 Encounters:  08/20/21 143 lb 12.8 oz (65.2 kg)  08/09/21 145 lb (65.8 kg)  07/26/21 143 lb (64.9 kg)      Vital signs reviewed  08/20/2021  - Note at rest 02 sats  99% on RA   General appearance:    amb wf nad    HEENT : Oropharynx  clear     Nasal turbinates nl    NECK :  without  apparent JVD/ palpable Nodes/TM    LUNGS: no acc muscle use,  Nl contour chest which is clear to A and P bilaterally without cough on insp or exp maneuvers   CV:  RRR  no s3 or murmur or increase in P2, and no edema   ABD:  soft and nontender with nl inspiratory excursion in the supine position. No bruits or organomegaly appreciated   MS:  Nl gait/ ext warm with mild DJD changes both hands s obvious joint restrictions  calf tenderness, cyanosis or clubbing    SKIN: warm and dry without lesions    NEURO:   alert, approp, nl sensorium with  no motor or cerebellar deficits apparent.         Assessment

## 2021-08-20 NOTE — Patient Instructions (Signed)
No change in medications - work back toward 30 min of exercise at a minimum 3 weekly    Please schedule a follow up visit in 12  months but call sooner if needed

## 2021-08-23 ENCOUNTER — Encounter: Payer: Self-pay | Admitting: Hematology & Oncology

## 2021-09-09 ENCOUNTER — Ambulatory Visit (INDEPENDENT_AMBULATORY_CARE_PROVIDER_SITE_OTHER): Payer: 59 | Admitting: Pharmacist

## 2021-09-09 DIAGNOSIS — Z794 Long term (current) use of insulin: Secondary | ICD-10-CM | POA: Diagnosis not present

## 2021-09-09 DIAGNOSIS — E119 Type 2 diabetes mellitus without complications: Secondary | ICD-10-CM

## 2021-09-09 NOTE — Progress Notes (Signed)
       09/09/2021 Name: Stephanie Carroll MRN: [id number]       DOB: February 11, 1956     S:  66 yoF Presents for diabetes evaluation, education, and management.  Patient was referred and last seen by Primary Care Provider on 07/02/21 Patient reports Diabetes was diagnosed in 2010.  She has been on insulin therapy for 5-6 years.  She has a high deductible plan which may be challenging to get additional medications.  She reports she gets insulin via [employer] patient assistance program.  She reports she is seeing GI to work through some issues.   Insurance coverage/medication affordability: [employer] health plan   Patient reports adherence with medications. Current diabetes medications include: lantus, metformin Current hypertension medications include: n/a Goal 130/80 Current hyperlipidemia medications include: n/a--discussed--need recent lipid panel (ordered)     Patient denies hypoglycemic events.   Patient reported dietary habits: Eats 3 meals/day Discussed eating 40-45 carbs per meal x3 meals daily Reviewed healthy plate handout Patient drinking more water Exercising more Occasional soda (regular) Limits sweets   Patient-reported exercise habits: increased exercise --walking     O: Most recent a1c--patient reported  8.2% from outside facility     Lipid Panel   Labs (Brief)          Component Value Date/Time    CHOL 211 (H) 03/08/2019 1603    TRIG 299.0 (H) 03/08/2019 1603    HDL 33.60 (L) 03/08/2019 1603    CHOLHDL 6 03/08/2019 1603    VLDL 59.8 (H) 03/08/2019 1603    LDLDIRECT 142.0 03/08/2019 1603         Home fasting blood sugars: <130   2 hour post-meal/random blood sugars: n/a.     Clinical Atherosclerotic Cardiovascular Disease (ASCVD): No    The 10-year ASCVD risk score (Arnett DK, et al., 2019) is: 12.3%   Values used to calculate the score:     Age: 66 years     Sex: Female     Is Non-Hispanic African American: No     Diabetic: Yes     Tobacco smoker: No      Systolic Blood Pressure: 622 mmHg     Is BP treated: No     HDL Cholesterol: 33.6 mg/dL     Total Cholesterol: 202 mg/dL     A/P:   Diabetes t2dm currently uncontrolled--a1c 8.2%.    -Continue lantus 57 units and metformin   -would like to add GLP1 therapy, however recently seeing GI and working through issues Consider GLP1 but insurance limited Will go on medicare next year, encouraged patient to reach out at time--will see if covered             Denies personal and family history of Medullary thyroid cancer (MTC)             Will reduce insulin at that time as needed   -Extensively discussed pathophysiology of diabetes, recommended lifestyle interventions, dietary effects on blood sugar control   -Counseled on s/sx of and management of hypoglycemia   -Next A1C anticipated 3 months.     Written patient instructions provided.  Total time in face to face counseling 25 minutes.        Regina Eck, PharmD, BCPS Clinical Pharmacist, [place]  [facility] Phone [phone]

## 2021-09-10 ENCOUNTER — Telehealth: Payer: Self-pay | Admitting: *Deleted

## 2021-09-10 NOTE — Telephone Encounter (Signed)
Message received from patient requesting that her appts be changed to yearly appts d/t financial concerns.  OK for pt to be seen yearly per Dr. Marin Olp.  Message sent to scheduling.

## 2021-09-20 ENCOUNTER — Inpatient Hospital Stay: Payer: 59 | Admitting: Hematology & Oncology

## 2021-09-20 ENCOUNTER — Inpatient Hospital Stay: Payer: 59

## 2021-10-19 ENCOUNTER — Ambulatory Visit: Payer: 59 | Admitting: Family

## 2021-10-19 ENCOUNTER — Encounter: Payer: Self-pay | Admitting: Family

## 2021-10-19 ENCOUNTER — Encounter: Payer: Self-pay | Admitting: Hematology & Oncology

## 2021-10-19 VITALS — BP 137/67 | HR 108 | Temp 98.0°F | Ht 63.0 in | Wt 143.0 lb

## 2021-10-19 DIAGNOSIS — E1159 Type 2 diabetes mellitus with other circulatory complications: Secondary | ICD-10-CM | POA: Diagnosis not present

## 2021-10-19 DIAGNOSIS — K760 Fatty (change of) liver, not elsewhere classified: Secondary | ICD-10-CM | POA: Diagnosis not present

## 2021-10-19 DIAGNOSIS — F411 Generalized anxiety disorder: Secondary | ICD-10-CM | POA: Diagnosis not present

## 2021-10-19 DIAGNOSIS — I152 Hypertension secondary to endocrine disorders: Secondary | ICD-10-CM

## 2021-10-19 DIAGNOSIS — E119 Type 2 diabetes mellitus without complications: Secondary | ICD-10-CM | POA: Diagnosis not present

## 2021-10-19 DIAGNOSIS — E559 Vitamin D deficiency, unspecified: Secondary | ICD-10-CM | POA: Diagnosis not present

## 2021-10-19 DIAGNOSIS — Z794 Long term (current) use of insulin: Secondary | ICD-10-CM | POA: Diagnosis not present

## 2021-10-19 DIAGNOSIS — K219 Gastro-esophageal reflux disease without esophagitis: Secondary | ICD-10-CM

## 2021-10-19 DIAGNOSIS — E538 Deficiency of other specified B group vitamins: Secondary | ICD-10-CM

## 2021-10-19 DIAGNOSIS — J4530 Mild persistent asthma, uncomplicated: Secondary | ICD-10-CM | POA: Diagnosis not present

## 2021-10-19 DIAGNOSIS — M19049 Primary osteoarthritis, unspecified hand: Secondary | ICD-10-CM

## 2021-10-19 DIAGNOSIS — D500 Iron deficiency anemia secondary to blood loss (chronic): Secondary | ICD-10-CM

## 2021-10-19 DIAGNOSIS — M19041 Primary osteoarthritis, right hand: Secondary | ICD-10-CM

## 2021-10-19 DIAGNOSIS — E785 Hyperlipidemia, unspecified: Secondary | ICD-10-CM | POA: Diagnosis not present

## 2021-10-19 DIAGNOSIS — E1169 Type 2 diabetes mellitus with other specified complication: Secondary | ICD-10-CM | POA: Diagnosis not present

## 2021-10-19 DIAGNOSIS — M2550 Pain in unspecified joint: Secondary | ICD-10-CM | POA: Diagnosis not present

## 2021-10-19 DIAGNOSIS — M19042 Primary osteoarthritis, left hand: Secondary | ICD-10-CM

## 2021-10-19 LAB — BAYER DCA HB A1C WAIVED: HB A1C (BAYER DCA - WAIVED): 7.9 % — ABNORMAL HIGH (ref 4.8–5.6)

## 2021-10-19 MED ORDER — DICLOFENAC SODIUM 1 % EX GEL: 2.0000 g | Freq: Four times a day (QID) | CUTANEOUS | 2 refills | Status: DC

## 2021-10-19 NOTE — Progress Notes (Signed)
 Subjective:    Patient ID: Stephanie Carroll, female    DOB: 02/03/1956, 66 y.o.   MRN: [id number]  Chief Complaint  Patient presents with   Medical Management of Chronic Issues   PT presents to the office today for chronic follow up. She is followed by Endocrinologists every 4 months for DM.   Followed by Pulmonologist annually for asthma. Followed by Oncologists annually for hx colon cancer 2008. Followed by GI for GERD, fatty liver, and hx colon cancer annually.    Diabetes She presents for her follow-up diabetic visit. She has type 2 diabetes mellitus. Hypoglycemia symptoms include nervousness/anxiousness. Pertinent negatives for diabetes include no blurred vision and no foot paresthesias. Symptoms are stable. Diabetic complications include peripheral neuropathy. Risk factors for coronary artery disease include diabetes mellitus, hypertension, sedentary lifestyle, post-menopausal, female sex and dyslipidemia. She is following a generally healthy diet. Her overall blood glucose range is 130-140 mg/dl. Eye exam is current.  Hypertension This is a chronic problem. The current episode started more than 1 year ago. The problem has been waxing and waning since onset. The problem is uncontrolled. Associated symptoms include anxiety, malaise/fatigue and shortness of breath. Pertinent negatives include no blurred vision or peripheral edema. Risk factors for coronary artery disease include dyslipidemia, diabetes mellitus, obesity and sedentary lifestyle. The current treatment provides moderate improvement.  Asthma She complains of shortness of breath. There is no cough or wheezing. This is a chronic problem. The current episode started more than 1 year ago. The problem occurs intermittently. Associated symptoms include heartburn and malaise/fatigue. Her past medical history is significant for asthma.  Gastroesophageal Reflux She complains of belching and heartburn. She reports no coughing or no wheezing.  This is a chronic problem. The current episode started more than 1 year ago. The problem occurs occasionally. The treatment provided mild relief.  Hyperlipidemia This is a chronic problem. The current episode started more than 1 year ago. The problem is controlled. Recent lipid tests were reviewed and are normal. Associated symptoms include shortness of breath. Current antihyperlipidemic treatment includes diet change. The current treatment provides no improvement of lipids. Risk factors for coronary artery disease include diabetes mellitus, dyslipidemia, a sedentary lifestyle, hypertension and post-menopausal.  Arthritis Presents for follow-up visit. She complains of pain and stiffness. Affected locations include the left MCP, right MCP and right knee. Her pain is at a severity of 9/10.  Anemia Presents for follow-up visit. Symptoms include malaise/fatigue. There has been no bruising/bleeding easily or pica.  Anxiety Presents for follow-up visit. Symptoms include excessive worry, irritability, nervous/anxious behavior and shortness of breath. Symptoms occur occasionally. The severity of symptoms is moderate.   Her past medical history is significant for anemia and asthma.      Review of Systems  Constitutional:  Positive for irritability and malaise/fatigue.  Eyes:  Negative for blurred vision.  Respiratory:  Positive for shortness of breath. Negative for cough and wheezing.   Gastrointestinal:  Positive for heartburn.  Musculoskeletal:  Positive for arthritis and stiffness.  Hematological:  Does not bruise/bleed easily.  Psychiatric/Behavioral:  The patient is nervous/anxious.   All other systems reviewed and are negative.      Objective:   Physical Exam Vitals reviewed.  Constitutional:      General: She is not in acute distress.    Appearance: She is well-developed.  HENT:     Head: Normocephalic and atraumatic.     Right Ear: Tympanic membrane normal.     Left Ear: Tympanic    membrane normal.  Eyes:     Pupils: Pupils are equal, round, and reactive to light.  Neck:     Thyroid: No thyromegaly.  Cardiovascular:     Rate and Rhythm: Normal rate and regular rhythm.     Heart sounds: Normal heart sounds. No murmur heard. Pulmonary:     Effort: Pulmonary effort is normal. No respiratory distress.     Breath sounds: Normal breath sounds. No wheezing.  Abdominal:     General: Bowel sounds are normal. There is no distension.     Palpations: Abdomen is soft.     Tenderness: There is no abdominal tenderness.  Musculoskeletal:        General: Tenderness (swelling and tenderness of right index and milddle finger, and left index finger) present. Normal range of motion.     Cervical back: Normal range of motion and neck supple.  Skin:    General: Skin is warm and dry.  Neurological:     Mental Status: She is alert and oriented to person, place, and time.     Cranial Nerves: No cranial nerve deficit.     Deep Tendon Reflexes: Reflexes are normal and symmetric.  Psychiatric:        Behavior: Behavior normal.        Thought Content: Thought content normal.        Judgment: Judgment normal.          BP 137/67 Comment: patient reports at home  Pulse (!) 108   Temp 98 F (36.7 C) (Temporal)   Ht 5' 3" (1.6 m)   Wt 143 lb (64.9 kg)   SpO2 98%   BMI 25.33 kg/m   Assessment & Plan:  Stephanie Carroll comes in today with chief complaint of Medical Management of Chronic Issues   Diagnosis and orders addressed:  1. Hypertension associated with type 2 diabetes mellitus (HCC) - CMP14+EGFR - CBC with Differential/Platelet  2. Chronic asthma, mild persistent, uncomplicated - CMP14+EGFR - CBC with Differential/Platelet  3. Gastro-esophageal reflux disease without esophagitis - CMP14+EGFR - CBC with Differential/Platelet  4. Non-alcoholic fatty liver disease - CMP14+EGFR - CBC with Differential/Platelet  5. Hyperlipidemia associated with type 2 diabetes  mellitus (HCC) - CMP14+EGFR - CBC with Differential/Platelet - Lipid panel  6. Type 2 diabetes mellitus without complication, with long-term current use of insulin (HCC) - Bayer DCA Hb A1c Waived - CMP14+EGFR - CBC with Differential/Platelet - Microalbumin / creatinine urine ratio  7. Hand arthritis - CMP14+EGFR - CBC with Differential/Platelet  8. Iron deficiency anemia due to chronic blood loss - CMP14+EGFR - CBC with Differential/Platelet  9. Multiple joint pain - CMP14+EGFR - CBC with Differential/Platelet  10. Vitamin D deficiency - CMP14+EGFR - CBC with Differential/Platelet  11. Vitamin B12 deficiency  - CMP14+EGFR - CBC with Differential/Platelet  12. GAD (generalized anxiety disorder) - CMP14+EGFR - CBC with Differential/Platelet  13. Primary osteoarthritis of both hands - diclofenac Sodium (VOLTAREN) 1 % GEL; Apply 2 g topically 4 (four) times daily.  Dispense: 300 g; Refill: 2 - Ambulatory referral to Physical Therapy   Labs pending Health Maintenance reviewed Diet and exercise encouraged  Follow up plan: 4 months   Christy Hawks, FNP    

## 2021-10-19 NOTE — Patient Instructions (Signed)
Osteoarthritis  Osteoarthritis is a type of arthritis. It refers to joint pain or joint disease. Osteoarthritis affects tissue that covers the ends of bones in joints (cartilage). Cartilage acts as a cushion between the bones and helps them move smoothly. Osteoarthritis occurs when cartilage in the joints gets worn down. Osteoarthritis is sometimes called "wear and tear" arthritis. Osteoarthritis is the most common form of arthritis. It often occurs in older people. It is a condition that gets worse over time. The joints most often affected by this condition are in the fingers, toes, hips, knees, and spine, including the neck and lower back. What are the causes? This condition is caused by the wearing down of cartilage that covers the ends of bones. What increases the risk? The following factors may make you more likely to develop this condition: Being age 29 or older. Obesity. Overuse of joints. Past injury of a joint. Past surgery on a joint. Family history of osteoarthritis. What are the signs or symptoms? The main symptoms of this condition are pain, swelling, and stiffness in the joint. Other symptoms may include: An enlarged joint. More pain and further damage caused by small pieces of bone or cartilage that break off and float inside of the joint. Small deposits of bone (osteophytes) that grow on the edges of the joint. A grating or scraping feeling inside the joint when you move it. Popping or creaking sounds when you move. Difficulty walking or exercising. An inability to grip items, twist your hand(s), or control the movements of your hands and fingers. How is this diagnosed? This condition may be diagnosed based on: Your medical history. A physical exam. Your symptoms. X-rays of the affected joint(s). Blood tests to rule out other types of arthritis. How is this treated? There is no cure for this condition, but treatment can help control pain and improve joint function.  Treatment may include a combination of therapies, such as: Pain relief techniques, such as: Applying heat and cold to the joint. Massage. A form of talk therapy called cognitive behavioral therapy (CBT). This therapy helps you set goals and follow up on the changes that you make. Medicines for pain and inflammation. The medicines can be taken by mouth or applied to the skin. They include: NSAIDs, such as ibuprofen. Prescription medicines. Strong anti-inflammatory medicines (corticosteroids). Certain nutritional supplements. A prescribed exercise program. You may work with a physical therapist. Assistive devices, such as a brace, wrap, splint, specialized glove, or cane. A weight control plan. Surgery, such as: An osteotomy. This is done to reposition the bones and relieve pain or to remove loose pieces of bone and cartilage. Joint replacement surgery. You may need this surgery if you have advanced osteoarthritis. Follow these instructions at home: Activity Rest your affected joints as told by your health care provider. Exercise as told by your health care provider. He or she may recommend specific types of exercise, such as: Strengthening exercises. These are done to strengthen the muscles that support joints affected by arthritis. Aerobic activities. These are exercises, such as brisk walking or water aerobics, that increase your heart rate. Range-of-motion activities. These help your joints move more easily. Balance and agility exercises. Managing pain, stiffness, and swelling     If directed, apply heat to the affected area as often as told by your health care provider. Use the heat source that your health care provider recommends, such as a moist heat pack or a heating pad. If you have a removable assistive device, remove it  as told by your health care provider. Place a towel between your skin and the heat source. If your health care provider tells you to keep the assistive device  on while you apply heat, place a towel between the assistive device and the heat source. Leave the heat on for 20-30 minutes. Remove the heat if your skin turns bright red. This is especially important if you are unable to feel pain, heat, or cold. You may have a greater risk of getting burned. If directed, put ice on the affected area. To do this: If you have a removable assistive device, remove it as told by your health care provider. Put ice in a plastic bag. Place a towel between your skin and the bag. If your health care provider tells you to keep the assistive device on during icing, place a towel between the assistive device and the bag. Leave the ice on for 20 minutes, 2-3 times a day. Move your fingers or toes often to reduce stiffness and swelling. Raise (elevate) the injured area above the level of your heart while you are sitting or lying down. General instructions Take over-the-counter and prescription medicines only as told by your health care provider. Maintain a healthy weight. Follow instructions from your health care provider for weight control. Do not use any products that contain nicotine or tobacco, such as cigarettes, e-cigarettes, and chewing tobacco. If you need help quitting, ask your health care provider. Use assistive devices as told by your health care provider. Keep all follow-up visits as told by your health care provider. This is important. Where to find more information [employer] of Arthritis and Musculoskeletal and Skin Diseases: www.niams.SouthExposed.es [employer] on Aging: http://kim-miller.com/ American College of Rheumatology: www.rheumatology.org Contact a health care provider if: You have redness, swelling, or a feeling of warmth in a joint that gets worse. You have a fever along with joint or muscle aches. You develop a rash. You have trouble doing your normal activities. Get help right away if: You have pain that gets worse and is not relieved by  pain medicine. Summary Osteoarthritis is a type of arthritis that affects tissue covering the ends of bones in joints (cartilage). This condition is caused by the wearing down of cartilage that covers the ends of bones. The main symptom of this condition is pain, swelling, and stiffness in the joint. There is no cure for this condition, but treatment can help control pain and improve joint function. This information is not intended to replace advice given to you by your health care provider. Make sure you discuss any questions you have with your health care provider. Document Revised: 01/28/2019 Document Reviewed: 01/28/2019 Elsevier Patient Education  [place].

## 2021-10-20 LAB — LIPID PANEL
Chol/HDL Ratio: 5.9 ratio — ABNORMAL HIGH (ref 0.0–4.4)
Cholesterol, Total: 208 mg/dL — ABNORMAL HIGH (ref 100–199)
HDL: 35 mg/dL — ABNORMAL LOW (ref >39)
LDL Chol Calc (NIH): 124 mg/dL — ABNORMAL HIGH (ref 0–99)
Triglycerides: 276 mg/dL — ABNORMAL HIGH (ref 0–149)
VLDL Cholesterol Cal: 49 mg/dL — ABNORMAL HIGH (ref 5–40)

## 2021-10-20 LAB — CBC WITH DIFFERENTIAL/PLATELET
Basophils Absolute: 0.1 10*3/uL (ref 0.0–0.2)
Basos: 1 %
EOS (ABSOLUTE): 0.3 10*3/uL (ref 0.0–0.4)
Eos: 3 %
Hematocrit: 41.2 % (ref 34.0–46.6)
Hemoglobin: 13.6 g/dL (ref 11.1–15.9)
Immature Grans (Abs): 0.1 10*3/uL (ref 0.0–0.1)
Immature Granulocytes: 1 %
Lymphocytes Absolute: 2.5 10*3/uL (ref 0.7–3.1)
Lymphs: 30 %
MCH: 29.8 pg (ref 26.6–33.0)
MCHC: 33 g/dL (ref 31.5–35.7)
MCV: 90 fL (ref 79–97)
Monocytes Absolute: 0.5 10*3/uL (ref 0.1–0.9)
Monocytes: 6 %
Neutrophils Absolute: 4.9 10*3/uL (ref 1.4–7.0)
Neutrophils: 59 %
Platelets: 220 10*3/uL (ref 150–450)
RBC: 4.56 x10E6/uL (ref 3.77–5.28)
RDW: 12.6 % (ref 11.7–15.4)
WBC: 8.3 10*3/uL (ref 3.4–10.8)

## 2021-10-20 LAB — CMP14+EGFR
ALT: 67 IU/L — ABNORMAL HIGH (ref 0–32)
AST: 52 IU/L — ABNORMAL HIGH (ref 0–40)
Albumin/Globulin Ratio: 1.8 (ref 1.2–2.2)
Albumin: 4.8 g/dL (ref 3.9–4.9)
Alkaline Phosphatase: 98 IU/L (ref 44–121)
BUN/Creatinine Ratio: 14 (ref 12–28)
BUN: 9 mg/dL (ref 8–27)
Bilirubin Total: 0.4 mg/dL (ref 0.0–1.2)
CO2: 22 mmol/L (ref 20–29)
Calcium: 9.8 mg/dL (ref 8.7–10.3)
Chloride: 102 mmol/L (ref 96–106)
Creatinine, Ser: 0.66 mg/dL (ref 0.57–1.00)
Globulin, Total: 2.7 g/dL (ref 1.5–4.5)
Glucose: 186 mg/dL — ABNORMAL HIGH (ref 70–99)
Potassium: 4.7 mmol/L (ref 3.5–5.2)
Sodium: 140 mmol/L (ref 134–144)
Total Protein: 7.5 g/dL (ref 6.0–8.5)
eGFR: 98 mL/min/{1.73_m2} (ref >59)

## 2021-10-20 LAB — MICROALBUMIN / CREATININE URINE RATIO
Creatinine, Urine: 115.8 mg/dL
Microalb/Creat Ratio: 10 mg/g creat (ref 0–29)
Microalbumin, Urine: 11.3 ug/mL

## 2021-10-21 ENCOUNTER — Other Ambulatory Visit: Payer: Self-pay | Admitting: Family

## 2021-10-21 MED ORDER — EMPAGLIFLOZIN 10 MG PO TABS: 10.0000 mg | ORAL_TABLET | Freq: Every day | ORAL | 1 refills | Status: DC

## 2021-10-21 MED ORDER — ROSUVASTATIN CALCIUM 5 MG PO TABS: 5.0000 mg | ORAL_TABLET | Freq: Every day | ORAL | 3 refills | Status: DC

## 2021-10-22 ENCOUNTER — Other Ambulatory Visit: Payer: Self-pay | Admitting: Family

## 2021-10-22 DIAGNOSIS — M19041 Primary osteoarthritis, right hand: Secondary | ICD-10-CM

## 2021-10-27 ENCOUNTER — Encounter: Payer: Self-pay | Admitting: Hematology & Oncology

## 2021-10-29 ENCOUNTER — Encounter: Payer: Self-pay | Admitting: Internal Medicine

## 2021-10-29 ENCOUNTER — Other Ambulatory Visit: Payer: Self-pay | Admitting: Internal Medicine

## 2021-10-29 ENCOUNTER — Ambulatory Visit (INDEPENDENT_AMBULATORY_CARE_PROVIDER_SITE_OTHER): Payer: 59 | Admitting: Internal Medicine

## 2021-10-29 VITALS — BP 146/92 | HR 80 | Ht 63.0 in | Wt 142.0 lb

## 2021-10-29 DIAGNOSIS — Z794 Long term (current) use of insulin: Secondary | ICD-10-CM | POA: Diagnosis not present

## 2021-10-29 DIAGNOSIS — E041 Nontoxic single thyroid nodule: Secondary | ICD-10-CM | POA: Diagnosis not present

## 2021-10-29 DIAGNOSIS — E113299 Type 2 diabetes mellitus with mild nonproliferative diabetic retinopathy without macular edema, unspecified eye: Secondary | ICD-10-CM | POA: Insufficient documentation

## 2021-10-29 DIAGNOSIS — E1165 Type 2 diabetes mellitus with hyperglycemia: Secondary | ICD-10-CM

## 2021-10-29 DIAGNOSIS — E785 Hyperlipidemia, unspecified: Secondary | ICD-10-CM

## 2021-10-29 MED ORDER — LANTUS SOLOSTAR 100 UNIT/ML ~~LOC~~ SOPN: 66.0000 [IU] | PEN_INJECTOR | Freq: Every day | SUBCUTANEOUS | 3 refills | Status: DC

## 2021-10-29 MED ORDER — INSULIN PEN NEEDLE 32G X 4 MM MISC: 1.0000 | Freq: Every day | 3 refills | Status: DC

## 2021-10-29 MED ORDER — METFORMIN HCL 500 MG PO TABS: 1000.0000 mg | ORAL_TABLET | Freq: Two times a day (BID) | ORAL | 3 refills | Status: DC

## 2021-10-29 NOTE — Progress Notes (Signed)
Name: Tanza Pellot Meuth  MRN/ DOB: [id number], [birth year]   Age/ Sex: 66 y.o., female    PCP: Sharion Balloon, FNP   Reason for Endocrinology Evaluation: Type 2 Diabetes Mellitus     Date of Initial Endocrinology Visit: 10/29/2021     PATIENT IDENTIFIER: Stephanie Carroll is a 66 y.o. female with a past medical history of T2DM, Asthma, HTN and dyslipidemia, Hx of colon cancer. . The patient presented for initial endocrinology clinic visit on 10/29/2021 for consultative assistance with her diabetes management.    HPI: Stephanie Carroll was    Diagnosed with DM 2010 Prior Medications tried/Intolerance: She was prescribed Jardiance  but did not take it.  Currently checking blood sugars 2 x / day Hypoglycemia episodes : no              Hemoglobin A1c ranging from   7.9% in 2023, peaking at 9.4% in 2022 Patient has required hospitalization within the last 1 year from hyper or hypoglycemia: no  In terms of diet, the patient eats 3 meals a day. Snacks x1 . Avoids sugar sweetened beverages    She was seen by [facility] 06/2021  Denies local neck swelling or pain  She has chronic abdominal issues due to hx of colon cancer. Has abdominal pain , chronic diarrhea and occasional nausea.  Last yeast infection was last month which is recurrent   LEFT THYROID NODULE : She is S/P FNA of the left thyroid nodule 2.7 cm nodule in 2011 . Ultrasound showed stability in 2021   [place]: Metformin 500 mg XR , 2 tabs BID Lantus 62 units daily    Statin: yes ACE-I/ARB: no    METER DOWNLOAD SUMMARY: Did not bring    DIABETIC COMPLICATIONS: Microvascular complications:  Mild nonproliferative DR Denies: CKD Last eye exam: Completed 08/24/2020  Macrovascular complications:   Denies: CAD, PVD, CVA   PAST HISTORY: Past Medical History:  Past Medical History:  Diagnosis Date   Allergy    Arthritis    DDD   Asthma    Colon cancer ([place]) 2008   Diabetes mellitus    Elevated liver  enzymes    GERD (gastroesophageal reflux disease)    History of thyroiditis 12/18/2007   Formatting of this note might be different from the original. Thyroiditis   Hyperlipidemia    Iron deficiency anemia due to chronic blood loss 09/17/2020   Thyroid tumor, benign    Past Surgical History:  Past Surgical History:  Procedure Laterality Date   BIOPSY THYROID     X2   CESAREAN SECTION     x 1   COLON SURGERY     FOOT FRACTURE SURGERY Left 02/2015   INCISIONAL HERNIA REPAIR     NASAL SEPTUM SURGERY     PARTIAL HYSTERECTOMY     POLYPECTOMY     UPPER GASTROINTESTINAL ENDOSCOPY     WRIST GANGLION EXCISION     left    Social History:  reports that she has never smoked. She has never used smokeless tobacco. She reports that she does not drink alcohol and does not use drugs. Family History:  Family History  Problem Relation Age of Onset   COPD Other        both sides   Diabetes Other        both sides   Heart disease Father    Kidney failure Father    Lung cancer Paternal Uncle    Leukemia Paternal Grandfather  Cervical cancer Paternal Aunt        x 2    Heart defect Mother        MVP   Breast cancer Mother    Colon cancer Neg Hx    Esophageal cancer Neg Hx    Rectal cancer Neg Hx    Stomach cancer Neg Hx      HOME MEDICATIONS: Allergies as of 10/29/2021       Reactions   Fluticasone-salmeterol Palpitations   Morphine Palpitations, Other (See Comments)   REACTION: heart racing   Peanut Oil Other (See Comments), Cough   congested   Peanut-containing Drug Products Cough   Sulfa Antibiotics Other (See Comments)   REACTION: takes away appetite, makes her feel nervous, jittery   Other Other (See Comments)   Oats and Green peppers---headaches   Penicillins Rash   Sulfamethoxazole Nausea Only   Ciprofloxacin Anxiety   Clindamycin Rash   Cyclobenzaprine Other (See Comments)   Loracarbef Rash      Metronidazole Other (See Comments)   Moxifloxacin Hcl In Nacl Anxiety    Penicillin G Sodium Rash        Medication List        Accurate as of October 29, 2021  8:22 AM. If you have any questions, ask your nurse or doctor.          aerochamber plus with mask inhaler Use as directed   albuterol 108 (90 Base) MCG/ACT inhaler Commonly known as: VENTOLIN HFA Inhale 1-2 puffs into the lungs every 4 (four) hours as needed. For shortness for breath   Asmanex HFA 200 MCG/ACT Aero Generic drug: Mometasone Furoate Inhale 1 puff into the lungs in the morning and at bedtime.   BLACK ELDERBERRY PO Take 1 tablet by mouth daily.   Cetirizine HCl 10 MG Caps Take 10 mg by mouth as needed.   cholecalciferol 25 MCG (1000 UNIT) tablet Commonly known as: VITAMIN D3 Take 1,000 Units by mouth daily.   diclofenac Sodium 1 % Gel Commonly known as: Voltaren Apply 2 g topically 4 (four) times daily.   empagliflozin 10 MG Tabs tablet Commonly known as: Jardiance Take 1 tablet (10 mg total) by mouth daily before breakfast.   EPINEPHrine 0.3 mg/0.3 mL Soaj injection Commonly known as: EPI-PEN as needed.   hydrOXYzine 25 MG capsule Commonly known as: VISTARIL Take 1 capsule (25 mg total) by mouth every 8 (eight) hours as needed.   insulin glargine 100 UNIT/ML injection Commonly known as: LANTUS Inject 57 Units into the skin daily.   levalbuterol 1.25 MG/3ML nebulizer solution Commonly known as: XOPENEX Take 1.25 mg by nebulization as needed for wheezing.   metFORMIN 500 MG tablet Commonly known as: GLUCOPHAGE Take 1,000 mg by mouth 2 (two) times daily. What changed: Another medication with the same name was removed. Continue taking this medication, and follow the directions you see here. Changed by: Dorita Sciara, MD   NON FORMULARY Plexus Probio 5 dietary supplement   NON FORMULARY Plexus Slim Hunger Control weight management series   RABEprazole 20 MG tablet Commonly known as: ACIPHEX TAKE 1 TABLET BY MOUTH TWICE A DAY    rosuvastatin 5 MG tablet Commonly known as: Crestor Take 1 tablet (5 mg total) by mouth daily.   vitamin B-12 50 MCG tablet Commonly known as: CYANOCOBALAMIN Take 50 mcg by mouth daily.         ALLERGIES: Allergies  Allergen Reactions   Fluticasone-Salmeterol Palpitations   Morphine Palpitations and Other (  See Comments)    REACTION: heart racing   Peanut Oil Other (See Comments) and Cough    congested   Peanut-Containing Drug Products Cough   Sulfa Antibiotics Other (See Comments)    REACTION: takes away appetite, makes her feel nervous, jittery   Other Other (See Comments)    Oats and Green peppers---headaches   Penicillins Rash   Sulfamethoxazole Nausea Only   Ciprofloxacin Anxiety   Clindamycin Rash   Cyclobenzaprine Other (See Comments)   Loracarbef Rash        Metronidazole Other (See Comments)   Moxifloxacin Hcl In Nacl Anxiety   Penicillin G Sodium Rash     REVIEW OF SYSTEMS: A comprehensive ROS was conducted with the patient and is negative except as per HPI    OBJECTIVE:   VITAL SIGNS: BP (!) 146/92 (BP Location: Left Arm, Patient Position: Sitting, Cuff Size: Small)   Pulse 80   Ht _0  (1.6 m)   Wt 142 lb (64.4 kg)   SpO2 97%   BMI 25.15 kg/m    PHYSICAL EXAM:  General: Pt appears well and is in NAD  Neck: General: Supple without adenopathy or carotid bruits. Thyroid: Thyroid size normal.  No goiter or nodules appreciated.   Lungs: Clear with good BS bilat with no rales, rhonchi, or wheezes  Heart: RRR   Abdomen:  soft, nontender  Extremities:  Lower extremities - No pretibial edema. No lesions.  Skin: Normal texture and temperature to palpation. No rash noted.  Neuro: MS is good with appropriate affect, pt is alert and Ox3    DM foot exam:    DATA REVIEWED:  Lab Results  Component Value Date   HGBA1C 7.9 (H) 10/19/2021    Latest Reference Range & Units 10/19/21 11:51  Sodium 134 - 144 mmol/L 140  Potassium 3.5 - 5.2 mmol/L  4.7  Chloride 96 - 106 mmol/L 102  CO2 20 - 29 mmol/L 22  Glucose 70 - 99 mg/dL 186 (H)  BUN 8 - 27 mg/dL 9  Creatinine 0.57 - 1.00 mg/dL 0.66  Calcium 8.7 - 10.3 mg/dL 9.8  BUN/Creatinine Ratio 12 - 28  14  eGFR >59 mL/min/1.73 98  Alkaline Phosphatase 44 - 121 IU/L 98  Albumin 3.9 - 4.9 g/dL 4.8  Albumin/Globulin Ratio 1.2 - 2.2  1.8  AST 0 - 40 IU/L 52 (H)  ALT 0 - 32 IU/L 67 (H)  Total Protein 6.0 - 8.5 g/dL 7.5  Total Bilirubin 0.0 - 1.2 mg/dL 0.4  Total CHOL/HDL Ratio 0.0 - 4.4 ratio 5.9 (H)  Cholesterol, Total 100 - 199 mg/dL 208 (H)  HDL Cholesterol >39 mg/dL 35 (L)  Triglycerides 0 - 149 mg/dL 276 (H)  VLDL Cholesterol Cal 5 - 40 mg/dL 49 (H)  LDL Chol Calc (NIH) 0 - 99 mg/dL 124 (H)    ASSESSMENT / PLAN / RECOMMENDATIONS:   1) Type 2 Diabetes Mellitus, Sub-optimally controlled, With retinopathic  complications - Most recent A1c of 7.9 %. Goal A1c < 7.0 %.    Plan: GENERAL: I have discussed with the patient the pathophysiology of diabetes. We went over the natural progression of the disease. We stressed the importance of lifestyle changes. I explained the complications associated with diabetes including retinopathy, nephropathy, neuropathy as well as increased risk of cardiovascular disease. We went over the benefit seen with glycemic control.   I explained to the patient that diabetic patients are at higher than normal risk for amputations.  Not a candidate  for GLP-1 agonists due to chronic GI issues since her colon cancer treatment She declines SGLT2 inhibitors, patient with recurrent genital infections, her friend tried it with UTIs.  And would like to avoid it, her last use genital infection was a month ago She is on patient assistance for Lantus pens She understands she is at maximum dose for metformin We discussed increasing her Lantus as below She was encouraged to bring her meter on future visits She will be referred to our CDE per her  request  MEDICATIONS: Increase Lantus 66 units daily Continue metformin 500 mg, 2 tabs twice daily  EDUCATION / INSTRUCTIONS: BG monitoring instructions: Patient is instructed to check her blood sugars 2 times a day. Call [place] [facility] if: BG persistently < 70  I reviewed the Rule of 15 for the treatment of hypoglycemia in detail with the patient. Literature supplied.   2) Diabetic complications:  Eye: Does  have known diabetic retinopathy.  Neuro/ Feet: Does not have known diabetic peripheral neuropathy. Renal: Patient does not at have known baseline CKD. She is  on an ACEI/ARB at present.  3) Dyslipidemia:   -LDL above goal at 124 mg/DL, she was prescribed Crestor by her PCP but she has not started yet -We discussed the cardiovascular benefits of statin therapy, and I have encouraged her to start this  Medication Start rosuvastatin 5 mg daily   4) left thyroid nodule:  -This was an incidental finding during  colon cancer imaging.  She is s/p benign FNA of the left 2.7 thyroid nodule with benign cytology -We will proceed with repeat thyroid ultrasound for stability    Follow-up in 6 months  Signed electronically by: Mack Guise, MD  [facility]  [facility] [address] [place], [place] [address] Phone: [phone] FAX: [phone]   CC: Sharion Balloon, [place] [address] Phone: [phone]  Fax: [phone]    Return to Endocrinology clinic as below: Future Appointments  Date Time Provider [place]  11/16/2021  1:00 PM Marliss Coots, OT [facility] None  01/18/2022 10:55 AM Sharion Balloon, FNP [facility] None  03/21/2022 10:15 AM [facility] None  03/21/2022 10:30 AM [place], Rudell Cobb, MD [facility] None

## 2021-10-29 NOTE — Patient Instructions (Addendum)
  Increase Lantus to 66 units daily  Continue Metformin 500 mg, 2 tablets twice daily    HOW TO TREAT LOW BLOOD SUGARS (Blood sugar LESS THAN 70 MG/DL) Please follow the RULE OF 15 for the treatment of hypoglycemia treatment (when your (blood sugars are less than 70 mg/dL)   STEP 1: Take 15 grams of carbohydrates when your blood sugar is low, which includes:  3-4 GLUCOSE TABS  OR 3-4 OZ OF JUICE OR REGULAR SODA OR ONE TUBE OF GLUCOSE GEL    STEP 2: RECHECK blood sugar in 15 MINUTES STEP 3: If your blood sugar is still low at the 15 minute recheck --> then, go back to STEP 1 and treat AGAIN with another 15 grams of carbohydrates.

## 2021-11-02 ENCOUNTER — Telehealth: Payer: Self-pay | Admitting: Family

## 2021-11-02 NOTE — Telephone Encounter (Signed)
Stop medication for a week and see if pain stops. If so, then restart every other day. If pain continues she can take Monday, Wednesday, and Friday.

## 2021-11-02 NOTE — Telephone Encounter (Signed)
Patient aware and verbalized understanding. °

## 2021-11-03 ENCOUNTER — Other Ambulatory Visit: Payer: 59

## 2021-11-10 ENCOUNTER — Other Ambulatory Visit: Payer: 59

## 2021-11-16 ENCOUNTER — Ambulatory Visit (HOSPITAL_COMMUNITY): Payer: 59 | Admitting: Occupational Therapy

## 2021-11-24 ENCOUNTER — Encounter: Payer: 59 | Attending: Family | Admitting: Nutrition

## 2021-11-24 VITALS — Ht 61.0 in | Wt 143.8 lb

## 2021-11-24 DIAGNOSIS — E1159 Type 2 diabetes mellitus with other circulatory complications: Secondary | ICD-10-CM

## 2021-11-24 DIAGNOSIS — E1165 Type 2 diabetes mellitus with hyperglycemia: Secondary | ICD-10-CM | POA: Insufficient documentation

## 2021-11-24 DIAGNOSIS — K760 Fatty (change of) liver, not elsewhere classified: Secondary | ICD-10-CM

## 2021-11-24 DIAGNOSIS — Z713 Dietary counseling and surveillance: Secondary | ICD-10-CM | POA: Diagnosis not present

## 2021-11-24 DIAGNOSIS — E559 Vitamin D deficiency, unspecified: Secondary | ICD-10-CM

## 2021-11-24 DIAGNOSIS — E119 Type 2 diabetes mellitus without complications: Secondary | ICD-10-CM

## 2021-11-24 DIAGNOSIS — K589 Irritable bowel syndrome without diarrhea: Secondary | ICD-10-CM

## 2021-11-24 DIAGNOSIS — E538 Deficiency of other specified B group vitamins: Secondary | ICD-10-CM

## 2021-11-24 DIAGNOSIS — Z794 Long term (current) use of insulin: Secondary | ICD-10-CM | POA: Insufficient documentation

## 2021-11-24 DIAGNOSIS — E1169 Type 2 diabetes mellitus with other specified complication: Secondary | ICD-10-CM

## 2021-11-24 NOTE — Progress Notes (Signed)
Medical Nutrition Therapy  Appointment Start time:  0930  Appointment End time:  30  Primary concerns today: Dm Type 2 Referral diagnosis: E11.8 Preferred learning style: No preference  Learning readiness: Ready   NUTRITION ASSESSMENT  86 y old wfemale referred for diabetes education by Dr. Kelton Pillar. She has been a diabetic over 10 yrs and hasn't been to see a dietitian. "I need to learn how to read food labels and learn how to count carbohydrates. I want to get my diabetes under better control and reduce my insulin."  PMH: HTN, GERD, Asthma, Hyperlipidemia, IBS, DDD, Anemia, h/o colon cancer. Liver enzymes are elevated.  A1C 7.9%.  She is wanting to learn how to eat  the right foods and reduce her insulin and not keep increasing it. Currently taking 62-66 units of Lantus at night. She admits sometimes she forgets her insulin at night.  Hyperlipidemia. Can't take the medication prescribed. It make her shoulder hurt a lot. Willing to work on eating a high fiber diet to provider 25-30 g fiber per day.  She is wiling to work with Lifestyle Medicine  of whole food plant based foods to help reduce her blood sugars and reduce insulin needs, reduce cholesterol levels, and  improve her overall health.   Anthropometrics  Wt Readings from Last 3 Encounters:  11/24/21 143 lb 12.8 oz (65.2 kg)  10/29/21 142 lb (64.4 kg)  10/19/21 143 lb (64.9 kg)   Ht Readings from Last 3 Encounters:  11/24/21 5' 1"  (1.549 m)  10/29/21 5' 3"  (1.6 m)  10/19/21 5' 3"  (1.6 m)   Body mass index is 27.17 kg/m. @BMIFA @ Facility age limit for growth %iles is 20 years. Facility age limit for growth %iles is 20 years.   Lipid Panel     Component Value Date/Time   CHOL 208 (H) 10/19/2021 1151   TRIG 276 (H) 10/19/2021 1151   HDL 35 (L) 10/19/2021 1151   CHOLHDL 5.9 (H) 10/19/2021 1151   CHOLHDL 6 03/08/2019 1603   VLDL 59.8 (H) 03/08/2019 1603   LDLCALC 124 (H) 10/19/2021 1151   LDLDIRECT 142.0  03/08/2019 1603   LABVLDL 49 (H) 10/19/2021 1151      Latest Ref Rng & Units 10/19/2021   11:51 AM 08/09/2021   10:56 AM 07/02/2021   12:22 PM  CMP  Glucose 70 - 99 mg/dL 186   188   BUN 8 - 27 mg/dL 9   11   Creatinine 0.57 - 1.00 mg/dL 0.66   0.66   Sodium 134 - 144 mmol/L 140   141   Potassium 3.5 - 5.2 mmol/L 4.7   4.5   Chloride 96 - 106 mmol/L 102   101   CO2 20 - 29 mmol/L 22   20   Calcium 8.7 - 10.3 mg/dL 9.8   9.9   Total Protein 6.0 - 8.5 g/dL 7.5  7.1  7.5   Total Bilirubin 0.0 - 1.2 mg/dL 0.4  0.4  0.3   Alkaline Phos 44 - 121 IU/L 98  78  91   AST 0 - 40 IU/L 52  36  58   ALT 0 - 32 IU/L 67  49  69     Clinical Medical Hx: See chart Medications: Lantus 66 units. Labs:  Lab Results  Component Value Date   HGBA1C 7.9 (H) 10/19/2021      Latest Ref Rng & Units 10/19/2021   11:51 AM 08/09/2021   10:56 AM 07/02/2021   12:22  PM  CMP  Glucose 70 - 99 mg/dL 186   188   BUN 8 - 27 mg/dL 9   11   Creatinine 0.57 - 1.00 mg/dL 0.66   0.66   Sodium 134 - 144 mmol/L 140   141   Potassium 3.5 - 5.2 mmol/L 4.7   4.5   Chloride 96 - 106 mmol/L 102   101   CO2 20 - 29 mmol/L 22   20   Calcium 8.7 - 10.3 mg/dL 9.8   9.9   Total Protein 6.0 - 8.5 g/dL 7.5  7.1  7.5   Total Bilirubin 0.0 - 1.2 mg/dL 0.4  0.4  0.3   Alkaline Phos 44 - 121 IU/L 98  78  91   AST 0 - 40 IU/L 52  36  58   ALT 0 - 32 IU/L 67  49  69    Lipid Panel     Component Value Date/Time   CHOL 208 (H) 10/19/2021 1151   TRIG 276 (H) 10/19/2021 1151   HDL 35 (L) 10/19/2021 1151   CHOLHDL 5.9 (H) 10/19/2021 1151   CHOLHDL 6 03/08/2019 1603   VLDL 59.8 (H) 03/08/2019 1603   LDLCALC 124 (H) 10/19/2021 1151   LDLDIRECT 142.0 03/08/2019 1603   LABVLDL 49 (H) 10/19/2021 1151    Notable Signs/Symptoms: none  Lifestyle & Dietary Hx Lives with her husband. Eats 3 meals per day usually.  Estimated daily fluid intake: 40 oz Supplements: Elderberry, Vit D, Plexus supplement Sleep: good Stress /  self-care:  Current average weekly physical activity: walks  24-Hr Dietary Recall Eats 2-3 meals per day. Has been eating out more. Willing to start cooking more at home.  Estimated Energy Needs Calories: 1200 Carbohydrate: 135g Protein: 90g Fat: 33g   NUTRITION DIAGNOSIS  NB-1.1 Food and nutrition-related knowledge deficit As related to Diabetes Type 2.  As evidenced by A1C 7.9%..   NUTRITION INTERVENTION  Nutrition education (E-1) on the following topics:  Nutrition and Diabetes education provided on My Plate, CHO counting, meal planning, portion sizes, timing of meals, avoiding snacks between meals unless having a low blood sugar, target ranges for A1C and blood sugars, signs/symptoms and treatment of hyper/hypoglycemia, monitoring blood sugars, taking medications as prescribed, benefits of exercising 30 minutes per day and prevention of complications of DM. Lifestyle Medicine  - Whole Food, Plant Predominant Nutrition is highly recommended: Eat Plenty of vegetables, Mushrooms, fruits, Legumes, Whole Grains, Nuts, seeds in lieu of processed meats, processed snacks/pastries red meat, poultry, eggs.    -It is better to avoid simple carbohydrates including: Cakes, Sweet Desserts, Ice Cream, Soda (diet and regular), Sweet Tea, Candies, Chips, Cookies, Store Bought Juices, Alcohol in Excess of  1-2 drinks a day, Lemonade,  Artificial Sweeteners, Doughnuts, Coffee Creamers, "Sugar-free" Products, etc, etc.  This is not a complete list.....  Exercise: If you are able: 30 -60 minutes a day ,4 days a week, or 150 minutes a week.  The longer the better.  Combine stretch, strength, and aerobic activities.  If you were told in the past that you have high risk for cardiovascular diseases, you may seek evaluation by your heart doctor prior to initiating moderate to intense exercise programs.   Handouts Provided Include  Lifestyle medicine handouts Hypoglycemia handout  Learning Style &  Readiness for Change Teaching method utilized: Visual & Auditory  Demonstrated degree of understanding via: Teach Back  Barriers to learning/adherence to lifestyle change: none  Goals Established by Pt  Goals  Walk 15 mins twice a day for 30 minutes daily Focus on more fruits and vegetables Increase high fiber foods Test blood sugars twice a day Get A1C down to 7% or less. Call MD if BS are less than 70 mg/dl 2-3 times per week.   MONITORING & EVALUATION Dietary intake, weekly physical activity, and blood sugars in 1 month.  Next Steps  Patient is to work on meal prepping and planning.

## 2021-11-24 NOTE — Patient Instructions (Signed)
Goals  Walk 15 mins twice a day for 30 minutes daily Focus on more fruits and vegetables Increase high fiber foods Test blood sugars twice a day Get A1C down to 7% or less. Call MD if BS are less than 70 mg/dl 2-3 times per week.

## 2021-11-26 ENCOUNTER — Ambulatory Visit (HOSPITAL_COMMUNITY): Payer: 59 | Attending: Family | Admitting: Occupational Therapy

## 2021-11-26 ENCOUNTER — Encounter (HOSPITAL_COMMUNITY): Payer: Self-pay | Admitting: Occupational Therapy

## 2021-11-26 DIAGNOSIS — M79641 Pain in right hand: Secondary | ICD-10-CM | POA: Diagnosis not present

## 2021-11-26 DIAGNOSIS — M25641 Stiffness of right hand, not elsewhere classified: Secondary | ICD-10-CM | POA: Insufficient documentation

## 2021-11-26 DIAGNOSIS — R29898 Other symptoms and signs involving the musculoskeletal system: Secondary | ICD-10-CM | POA: Insufficient documentation

## 2021-11-26 DIAGNOSIS — M79642 Pain in left hand: Secondary | ICD-10-CM | POA: Insufficient documentation

## 2021-11-26 DIAGNOSIS — M25642 Stiffness of left hand, not elsewhere classified: Secondary | ICD-10-CM | POA: Diagnosis not present

## 2021-11-26 NOTE — Patient Instructions (Signed)
Adaptive Equipment  -check [employer]  -check [email or web address]  -check [email or web address]   Look up/Research:  -Button Hook  -Adaptive Jar Openers  -Rocker Knife  -Adaptive Potato Peeler  -Apple Peeler  -Magnetic Jewelry Clasps

## 2021-11-26 NOTE — Therapy (Signed)
OUTPATIENT OCCUPATIONAL THERAPY ORTHO EVALUATION  Patient Name: Stephanie Carroll MRN: [id number] DOB:[birth year], 66 y.o., female Today's Date: 11/26/2021  PCP: Evelina Dun, FNP REFERRING PROVIDER: Evelina Dun, FNP    OT End of Session - 11/26/21 1117     Visit Number 1    Number of Visits 4    Date for OT Re-Evaluation 12/26/21    Authorization Type [employer]; $20 copay    Progress Note Due on Visit 10    OT Start Time 1030    OT Stop Time 1109    OT Time Calculation (min) 39 min    Activity Tolerance Patient tolerated treatment well    Behavior During Therapy WFL for tasks assessed/performed             Past Medical History:  Diagnosis Date   Allergy    Arthritis    DDD   Asthma    Colon cancer ([place]) 2008   Diabetes mellitus    Elevated liver enzymes    GERD (gastroesophageal reflux disease)    History of thyroiditis 12/18/2007   Formatting of this note might be different from the original. Thyroiditis   Hyperlipidemia    Iron deficiency anemia due to chronic blood loss 09/17/2020   Thyroid tumor, benign    Past Surgical History:  Procedure Laterality Date   BIOPSY THYROID     X2   CESAREAN SECTION     x 1   COLON SURGERY     FOOT FRACTURE SURGERY Left 02/2015   INCISIONAL HERNIA REPAIR     NASAL SEPTUM SURGERY     PARTIAL HYSTERECTOMY     POLYPECTOMY     UPPER GASTROINTESTINAL ENDOSCOPY     WRIST GANGLION EXCISION     left   Patient Active Problem List   Diagnosis Date Noted   Left thyroid nodule 10/29/2021   Type 2 diabetes mellitus with mild nonproliferative retinopathy without macular edema, with long-term current use of insulin ([place]) 10/29/2021   Type 2 diabetes mellitus with hyperglycemia, with long-term current use of insulin ([place]) 10/29/2021   Diabetic retinopathy ([place]) 02/24/2021   Vitamin B12 deficiency 02/24/2021   GAD (generalized anxiety disorder) 02/24/2021   Hypertension associated with type 2 diabetes mellitus ([place]) 10/23/2020   Iron  deficiency anemia due to chronic blood loss 09/17/2020   Chronic asthma, mild persistent, uncomplicated 98/26/4158   Carpal tunnel syndrome of right wrist 11/06/2017   Cervical radiculopathy 10/20/2017   DDD (degenerative disc disease), cervical 10/20/2017   Environmental allergies 03/23/2015   Hand arthritis 03/23/2015   Multiple joint pain 03/23/2015   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   Type 2 diabetes mellitus without complication, with long-term current use of insulin ([place]) 03/23/2015   Internal hemorrhoids 30/94/0768   Non-alcoholic fatty liver disease 11/27/2013   Rectal bleeding 09/20/2013   Personal history of colon cancer, stage I 09/20/2013   Vitamin D deficiency 12/19/2008   History of malignant neoplasm of large intestine 08/04/2008   Hyperlipidemia associated with type 2 diabetes mellitus ([place]) 06/19/2008   Irritable bowel syndrome 03/19/2008   Diaphragmatic hernia 12/18/2007   Gastro-esophageal reflux disease without esophagitis 06/06/2007   Migraine headache 02/12/2007    ONSET DATE: Chronic  REFERRING DIAG: osteoarthritis of both hands  THERAPY DIAG:  Pain in left hand  Pain in right hand  Other symptoms and signs involving the musculoskeletal system  Stiffness of right hand, not elsewhere classified  Stiffness of left hand, not elsewhere classified  Rationale for Evaluation and Treatment Rehabilitation  SUBJECTIVE:   SUBJECTIVE STATEMENT: S: My hands are really stiff in the mornings.  Pt accompanied by: self  PERTINENT HISTORY: Pt is a 66 y/o female presenting with bilateral hand arthritis, seems to be worsening over the past three years. 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 Pt is having pain and numbness, burning sensation, reports that she has diabetes as well, wonders if this is contributing to the numbness.   PRECAUTIONS: Other: No lifting over 10# due to hernia sx  WEIGHT BEARING RESTRICTIONS  No  PAIN:  Are you having pain? Yes: NPRS scale: 6/10 Pain location: bilateral hands Pain description: aching Aggravating factors: cold, tedious tasks Relieving factors: heat  FALLS: Has patient fallen in last 6 months? No  PLOF: Independent  PATIENT GOALS To have less pain in her hands.   OBJECTIVE:   HAND DOMINANCE: Right   ADLs: Overall ADLs: Pt reports she is having difficulty with jewelry, opening jars or bottles, gripping items often dropping things and breaking them. Pt has stiffness in the mornings when she wakes up.    FUNCTIONAL OUTCOME MEASURES: Quick Dash: 63.64  UPPER EXTREMITY ROM       Pt with approximately 80-90% ROM in her digits, able to make ~85% fist.    HAND FUNCTION: Grip strength: Right: 2 lbs; Left: 10 lbs, Lateral pinch: Right: 5 lbs, Left: 7 lbs, and 3 point pinch: Right: 2 lbs, Left: 4 lbs  COORDINATION: 9 Hole Peg test: Right: 30.82" sec; Left: 34.78" sec   COGNITION: Overall cognitive status: Within functional limits for tasks assessed   PATIENT EDUCATION: Education details: AE-button hooks, jar openers, dysom Person educated: Patient Education method: Explanation, Demonstration, and Handouts Education comprehension: verbalized understanding   HOME EXERCISE PROGRAM: Eval: AE education  GOALS: Goals reviewed with patient? Yes  SHORT TERM GOALS: Target date: 12/24/2021    Pt will be provided with and educated on HEP to improve ability to grasp and manipulate items with bilateral hands.   Goal status: INITIAL  2.  Pt will be educated on AE available for use to improve independence in ADL completion and decrease pain in hands during tasks.   Goal status: INITIAL  3.  Pt will be educated on joint protection strategies to maintain joint integrity and decrease stress on bilateral hands during functional task completion.   Goal status: INITIAL  4.  Pt will decrease pain in bilateral hands to 5/10 or less to improve ability to  sleep, waking due to pain less than 2x per night, using pain management strategies.   Goal status: INITIAL  5.  Pt will increase bilateral grip strength by 5# to improve ability to grasp and carry lightweight objects during meal preparation and housekeeping tasks.   Goal status: INITIAL    ASSESSMENT:  CLINICAL IMPRESSION: Patient is a 66 y.o. female who was seen today for occupational therapy evaluation for bilateral hand pain and stiffness due to arthritis. Pt presents with bilateral hand pain, weakness, and stiffness and reports difficulty with basic ADLs as well as meal preparation, housekeeping, and caregiver tasks. Educated pt today on some AE, provided dysom for improved grip during meal preparation. Discussed management strategies and provided websites to research AE.    PERFORMANCE DEFICITS in functional skills including ADLs, IADLs, sensation, strength, pain, endurance, and UE functional use  IMPAIRMENTS are limiting patient from ADLs, IADLs, rest and sleep, and leisure.   COMORBIDITIES may have co-morbidities  that  affects occupational performance. Patient will benefit from skilled OT to address above impairments and improve overall function.  MODIFICATION OR ASSISTANCE TO COMPLETE EVALUATION: No modification of tasks or assist necessary to complete an evaluation.  OT OCCUPATIONAL PROFILE AND HISTORY: Problem focused assessment: Including review of records relating to presenting problem.  CLINICAL DECISION MAKING: LOW - limited treatment options, no task modification necessary  REHAB POTENTIAL: Good  EVALUATION COMPLEXITY: Low      PLAN: OT FREQUENCY: 1x/week  OT DURATION: 4 weeks  PLANNED INTERVENTIONS: self care/ADL training, therapeutic exercise, therapeutic activity, paraffin, patient/family education, and DME and/or AE instructions  CONSULTED AND AGREED WITH PLAN OF CARE: Patient  PLAN FOR NEXT SESSION: Follow up on AE research, begin gentle grip  strengthening and practice with button hook, go over joint protection strategies   Guadelupe Sabin, OTR/L  [phone] 11/26/2021, 11:19 AM

## 2021-11-29 ENCOUNTER — Other Ambulatory Visit: Payer: Self-pay | Admitting: Gastroenterology

## 2021-12-03 ENCOUNTER — Encounter (HOSPITAL_COMMUNITY): Payer: Self-pay | Admitting: Occupational Therapy

## 2021-12-03 ENCOUNTER — Ambulatory Visit (HOSPITAL_COMMUNITY): Payer: 59 | Admitting: Occupational Therapy

## 2021-12-03 DIAGNOSIS — M79642 Pain in left hand: Secondary | ICD-10-CM | POA: Diagnosis not present

## 2021-12-03 DIAGNOSIS — M25641 Stiffness of right hand, not elsewhere classified: Secondary | ICD-10-CM

## 2021-12-03 DIAGNOSIS — R29898 Other symptoms and signs involving the musculoskeletal system: Secondary | ICD-10-CM | POA: Diagnosis not present

## 2021-12-03 DIAGNOSIS — M25642 Stiffness of left hand, not elsewhere classified: Secondary | ICD-10-CM | POA: Diagnosis not present

## 2021-12-03 DIAGNOSIS — M79641 Pain in right hand: Secondary | ICD-10-CM

## 2021-12-03 NOTE — Therapy (Addendum)
OUTPATIENT OCCUPATIONAL THERAPY ORTHO TREATMENT  Patient Name: Stephanie Carroll MRN: [id number] DOB:1956-01-24, 66 y.o., female Today's Date: 12/03/2021  PCP: Evelina Dun, FNP REFERRING PROVIDER: Evelina Dun, FNP    OT End of Session - 12/03/21 1158     Visit Number 2    Number of Visits 4    Date for OT Re-Evaluation 12/26/21    Authorization Type [employer]; $20 copay    Progress Note Due on Visit 10    OT Start Time 1115    OT Stop Time 1157    OT Time Calculation (min) 42 min    Activity Tolerance Patient tolerated treatment well    Behavior During Therapy [facility] for tasks assessed/performed              Past Medical History:  Diagnosis Date   Allergy    Arthritis    DDD   Asthma    Colon cancer ([place]) 2008   Diabetes mellitus    Elevated liver enzymes    GERD (gastroesophageal reflux disease)    History of thyroiditis 12/18/2007   Formatting of this note might be different from the original. Thyroiditis   Hyperlipidemia    Iron deficiency anemia due to chronic blood loss 09/17/2020   Thyroid tumor, benign    Past Surgical History:  Procedure Laterality Date   BIOPSY THYROID     X2   CESAREAN SECTION     x 1   COLON SURGERY     FOOT FRACTURE SURGERY Left 02/2015   INCISIONAL HERNIA REPAIR     NASAL SEPTUM SURGERY     PARTIAL HYSTERECTOMY     POLYPECTOMY     UPPER GASTROINTESTINAL ENDOSCOPY     WRIST GANGLION EXCISION     left   Patient Active Problem List   Diagnosis Date Noted   Left thyroid nodule 10/29/2021   Type 2 diabetes mellitus with mild nonproliferative retinopathy without macular edema, with long-term current use of insulin ([place]) 10/29/2021   Type 2 diabetes mellitus with hyperglycemia, with long-term current use of insulin ([place]) 10/29/2021   Diabetic retinopathy ([place]) 02/24/2021   Vitamin B12 deficiency 02/24/2021   GAD (generalized anxiety disorder) 02/24/2021   Hypertension associated with type 2 diabetes mellitus ([place]) 10/23/2020   Iron  deficiency anemia due to chronic blood loss 09/17/2020   Chronic asthma, mild persistent, uncomplicated 81/82/9937   Carpal tunnel syndrome of right wrist 11/06/2017   Cervical radiculopathy 10/20/2017   DDD (degenerative disc disease), cervical 10/20/2017   Environmental allergies 03/23/2015   Hand arthritis 03/23/2015   Multiple joint pain 03/23/2015   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   Type 2 diabetes mellitus without complication, with long-term current use of insulin ([employer]) 03/23/2015   Internal hemorrhoids 16/96/7893   Non-alcoholic fatty liver disease 11/27/2013   Rectal bleeding 09/20/2013   Personal history of colon cancer, stage I 09/20/2013   Vitamin D deficiency 12/19/2008   History of malignant neoplasm of large intestine 08/04/2008   Hyperlipidemia associated with type 2 diabetes mellitus ([place]) 06/19/2008   Irritable bowel syndrome 03/19/2008   Diaphragmatic hernia 12/18/2007   Gastro-esophageal reflux disease without esophagitis 06/06/2007   Migraine headache 02/12/2007    ONSET DATE: Chronic  REFERRING DIAG: osteoarthritis of both hands  THERAPY DIAG:  Pain in left hand  Pain in right hand  Other symptoms and signs involving the musculoskeletal system  Stiffness of right hand, not elsewhere classified  Stiffness of left hand, not elsewhere classified  Rationale for Evaluation and Treatment  Rehabilitation  SUBJECTIVE:   SUBJECTIVE STATEMENT: S: "My right hand is hurting worse than my left today".  Pt accompanied by: self  PERTINENT HISTORY: Pt is a 66 y/o female presenting with bilateral hand arthritis, seems to be worsening over the past three years. 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 Pt is having pain and numbness, burning sensation, reports that she has diabetes as well, wonders if this is contributing to the numbness.   PRECAUTIONS: Other: No lifting over 10# due to hernia sx  WEIGHT BEARING RESTRICTIONS  No  PAIN:  Are you having pain? Yes: NPRS scale: 6/10 Pain location: bilateral hands Pain description: aching Aggravating factors: cold, tedious tasks Relieving factors: heat  FALLS: Has patient fallen in last 6 months? No  PLOF: Independent  PATIENT GOALS To have less pain in her hands.   OBJECTIVE:   HAND DOMINANCE: Right   ADLs: Overall ADLs: Pt reports she is having difficulty with jewelry, opening jars or bottles, gripping items often dropping things and breaking them. Pt has stiffness in the mornings when she wakes up.    FUNCTIONAL OUTCOME MEASURES: Quick Dash: 63.64  UPPER EXTREMITY ROM       Pt with approximately 80-90% ROM in her digits, able to make ~85% fist.    HAND FUNCTION: Grip strength: Right: 2 lbs; Left: 10 lbs, Lateral pinch: Right: 5 lbs, Left: 7 lbs, and 3 point pinch: Right: 2 lbs, Left: 4 lbs  COORDINATION: 9 Hole Peg test: Right: 30.82" sec; Left: 34.78" sec   COGNITION: Overall cognitive status: Within functional limits for tasks assessed   PATIENT EDUCATION: Education details: AE-button hooks, jar openers, dysom Person educated: Patient Education method: Consulting civil engineer, Demonstration, and Handouts Education comprehension: verbalized understanding   HOME EXERCISE PROGRAM: Eval: AE education 10/20: how to make at home heat pack   TREATMENT:   10/20  - Education: demonstrated use of button hook, practiced on button up shirt on table and on self. Went over joint protection strategies. Went over how to make at home heat pack with rice and sock.       GOALS: Goals reviewed with patient? Yes  SHORT TERM GOALS: Target date: 12/31/2021    Pt will be provided with and educated on HEP to improve ability to grasp and manipulate items with bilateral hands.   Goal status: INITIAL  2.  Pt will be educated on AE available for use to improve independence in ADL completion and decrease pain in hands during tasks.   Goal status:  INITIAL  3.  Pt will be educated on joint protection strategies to maintain joint integrity and decrease stress on bilateral hands during functional task completion.   Goal status: INITIAL  4.  Pt will decrease pain in bilateral hands to 5/10 or less to improve ability to sleep, waking due to pain less than 2x per night, using pain management strategies.   Goal status: INITIAL  5.  Pt will increase bilateral grip strength by 5# to improve ability to grasp and carry lightweight objects during meal preparation and housekeeping tasks.   Goal status: INITIAL    ASSESSMENT:  CLINICAL IMPRESSION: Pt reports that she is having more pain in her right hand verses her left hand. She stated that she has purchased several adaptive equipment items- button hook, jar opener, necklace clasp, potato peeler. Pt reports that is starting to become difficult to manipulate her inhaler and insulin shots due to  pain. Reviewed joint protection strategies with patient, pt verbalized understanding. Demonstrated use of button hook- pt able to don shirt and complete all buttons with use of button hook. Educated pt on how to make an at home heat pack with sock and rice, discussed microwaving pack for less time than suggested due to increased numbness in hands to decrease chance of burning hands.  Discussed bringing in adaptive equiprmet to next session if there are any questions on use. Encouraged pt to follow up with doctor about increased numbness in hands.   PERFORMANCE DEFICITS in functional skills including ADLs, IADLs, sensation, strength, pain, endurance, and UE functional use  IMPAIRMENTS are limiting patient from ADLs, IADLs, rest and sleep, and leisure.   COMORBIDITIES may have co-morbidities  that affects occupational performance. Patient will benefit from skilled OT to address above impairments and improve overall function.  MODIFICATION OR ASSISTANCE TO COMPLETE EVALUATION: No modification of tasks or  assist necessary to complete an evaluation.  OT OCCUPATIONAL PROFILE AND HISTORY: Problem focused assessment: Including review of records relating to presenting problem.  CLINICAL DECISION MAKING: LOW - limited treatment options, no task modification necessary  REHAB POTENTIAL: Good  EVALUATION COMPLEXITY: Low      PLAN: OT FREQUENCY: 1x/week  OT DURATION: 4 weeks  PLANNED INTERVENTIONS: self care/ADL training, therapeutic exercise, therapeutic activity, paraffin, patient/family education, and DME and/or AE instructions  CONSULTED AND AGREED WITH PLAN OF CARE: Patient  PLAN FOR NEXT SESSION: Follow up on AE research, begin gentle grip strengthening and practice with button hook, go over joint protection strategies   Arvil Persons, OTR/L  [phone] 12/03/2021, 12:07 PM

## 2021-12-03 NOTE — Patient Instructions (Signed)
At home heat pack  Using a large knee high sock  2 cups of white rice with up to 10 drops of essential oils- mix Transfer into sock Tie off with string Microwave for 3-4 mins starting out (suggested time is 5 mins) test on arm or leg for heat temp. Leave on for 10-15 minutes and reheat if needed

## 2021-12-06 ENCOUNTER — Ambulatory Visit
Admission: RE | Admit: 2021-12-06 | Discharge: 2021-12-06 | Disposition: A | Discharge status: Home / Self Care | Payer: 59 | Source: Ambulatory Visit | Attending: Internal Medicine | Admitting: Internal Medicine

## 2021-12-06 DIAGNOSIS — E041 Nontoxic single thyroid nodule: Secondary | ICD-10-CM | POA: Diagnosis not present

## 2021-12-08 ENCOUNTER — Encounter (HOSPITAL_COMMUNITY): Payer: 59 | Admitting: Occupational Therapy

## 2021-12-10 ENCOUNTER — Encounter (HOSPITAL_COMMUNITY): Payer: Self-pay | Admitting: Occupational Therapy

## 2021-12-10 ENCOUNTER — Ambulatory Visit (HOSPITAL_COMMUNITY): Payer: 59 | Admitting: Occupational Therapy

## 2021-12-10 DIAGNOSIS — M25642 Stiffness of left hand, not elsewhere classified: Secondary | ICD-10-CM | POA: Diagnosis not present

## 2021-12-10 DIAGNOSIS — M25641 Stiffness of right hand, not elsewhere classified: Secondary | ICD-10-CM | POA: Diagnosis not present

## 2021-12-10 DIAGNOSIS — M79642 Pain in left hand: Secondary | ICD-10-CM

## 2021-12-10 DIAGNOSIS — M79641 Pain in right hand: Secondary | ICD-10-CM

## 2021-12-10 DIAGNOSIS — R29898 Other symptoms and signs involving the musculoskeletal system: Secondary | ICD-10-CM | POA: Diagnosis not present

## 2021-12-10 NOTE — Patient Instructions (Signed)
AROM Exercises   1) Wrist Flexion  Start with wrist at edge of table, palm facing up. With wrist hanging slightly off table, curl wrist upward, and back down.      2) Wrist Extension  Start with wrist at edge of table, palm facing down. With wrist slightly off the edge of the table, curl wrist up and back down.      3) Radial Deviations  Start with forearm flat against a table, wrist hanging slightly off the edge, and palm facing the wall. Bending at the wrist only, and keeping palm facing the wall, bend wrist so fist is pointing towards the floor, back up to start position, and up towards the ceiling. Return to start.        4) WRIST PRONATION  Turn your forearm towards palm face down.  Keep your elbow bent and by the side of your  Body.      5) WRIST SUPINATION  Turn your forearm towards palm face up.  Keep your elbow bent and by the side of your  Body.      *Complete exercises __10____ times each, ___2-3____ times per day*

## 2021-12-10 NOTE — Therapy (Signed)
OUTPATIENT OCCUPATIONAL THERAPY ORTHO TREATMENT  Patient Name: Stephanie Carroll MRN: [id number] DOB:1955/11/08, 66 y.o., female Today's Date: 12/10/2021  PCP: Evelina Dun, FNP REFERRING PROVIDER: Evelina Dun, FNP    OT End of Session - 12/10/21 1341     Visit Number 3    Number of Visits 4    Date for OT Re-Evaluation 12/26/21    Authorization Type [employer]; $20 copay    Progress Note Due on Visit 10    OT Start Time 1300    OT Stop Time 1345    OT Time Calculation (min) 45 min    Activity Tolerance Patient tolerated treatment well    Behavior During Therapy [facility] for tasks assessed/performed               Past Medical History:  Diagnosis Date   Allergy    Arthritis    DDD   Asthma    Colon cancer ([place]) 2008   Diabetes mellitus    Elevated liver enzymes    GERD (gastroesophageal reflux disease)    History of thyroiditis 12/18/2007   Formatting of this note might be different from the original. Thyroiditis   Hyperlipidemia    Iron deficiency anemia due to chronic blood loss 09/17/2020   Thyroid tumor, benign    Past Surgical History:  Procedure Laterality Date   BIOPSY THYROID     X2   CESAREAN SECTION     x 1   COLON SURGERY     FOOT FRACTURE SURGERY Left 02/2015   INCISIONAL HERNIA REPAIR     NASAL SEPTUM SURGERY     PARTIAL HYSTERECTOMY     POLYPECTOMY     UPPER GASTROINTESTINAL ENDOSCOPY     WRIST GANGLION EXCISION     left   Patient Active Problem List   Diagnosis Date Noted   Left thyroid nodule 10/29/2021   Type 2 diabetes mellitus with mild nonproliferative retinopathy without macular edema, with long-term current use of insulin ([place]) 10/29/2021   Type 2 diabetes mellitus with hyperglycemia, with long-term current use of insulin ([place]) 10/29/2021   Diabetic retinopathy ([place]) 02/24/2021   Vitamin B12 deficiency 02/24/2021   GAD (generalized anxiety disorder) 02/24/2021   Hypertension associated with type 2 diabetes mellitus ([place]) 10/23/2020   Iron  deficiency anemia due to chronic blood loss 09/17/2020   Chronic asthma, mild persistent, uncomplicated 64/40/3474   Carpal tunnel syndrome of right wrist 11/06/2017   Cervical radiculopathy 10/20/2017   DDD (degenerative disc disease), cervical 10/20/2017   Environmental allergies 03/23/2015   Hand arthritis 03/23/2015   Multiple joint pain 03/23/2015   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   Type 2 diabetes mellitus without complication, with long-term current use of insulin ([place]) 03/23/2015   Internal hemorrhoids 25/95/6387   Non-alcoholic fatty liver disease 11/27/2013   Rectal bleeding 09/20/2013   Personal history of colon cancer, stage I 09/20/2013   Vitamin D deficiency 12/19/2008   History of malignant neoplasm of large intestine 08/04/2008   Hyperlipidemia associated with type 2 diabetes mellitus ([place]) 06/19/2008   Irritable bowel syndrome 03/19/2008   Diaphragmatic hernia 12/18/2007   Gastro-esophageal reflux disease without esophagitis 06/06/2007   Migraine headache 02/12/2007    ONSET DATE: Chronic  REFERRING DIAG: osteoarthritis of both hands  THERAPY DIAG:  Pain in left hand  Pain in right hand  Other symptoms and signs involving the musculoskeletal system  Stiffness of right hand, not elsewhere classified  Stiffness of left hand, not elsewhere classified  Rationale for Evaluation and  Treatment Rehabilitation  SUBJECTIVE:   SUBJECTIVE STATEMENT: S: "I cannot figure out how to work this opener I got ".  Pt accompanied by: self  PERTINENT HISTORY: Pt is a 66 y/o female presenting with bilateral hand arthritis, seems to be worsening over the past three years. 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 Pt is having pain and numbness, burning sensation, reports that she has diabetes as well, wonders if this is contributing to the numbness.   PRECAUTIONS: Other: No lifting over 10# due to hernia sx  WEIGHT BEARING RESTRICTIONS  No  PAIN:  Are you having pain? Yes: NPRS scale: 7/10 Pain location: bilateral hands Pain description: aching Aggravating factors: cold, tedious tasks Relieving factors: heat  FALLS: Has patient fallen in last 6 months? No  PLOF: Independent  PATIENT GOALS To have less pain in her hands.   OBJECTIVE:   HAND DOMINANCE: Right   ADLs: Overall ADLs: Pt reports she is having difficulty with jewelry, opening jars or bottles, gripping items often dropping things and breaking them. Pt has stiffness in the mornings when she wakes up.    FUNCTIONAL OUTCOME MEASURES: Quick Dash: 63.64  UPPER EXTREMITY ROM       Pt with approximately 80-90% ROM in her digits, able to make ~85% fist.    HAND FUNCTION: Grip strength: Right: 2 lbs; Left: 10 lbs, Lateral pinch: Right: 5 lbs, Left: 7 lbs, and 3 point pinch: Right: 2 lbs, Left: 4 lbs  COORDINATION: 9 Hole Peg test: Right: 30.82" sec; Left: 34.78" sec   COGNITION: Overall cognitive status: Within functional limits for tasks assessed   PATIENT EDUCATION: Education details: AE-button hooks, jar openers, dysom Person educated: Patient Education method: Explanation, Demonstration, and Handouts Education comprehension: verbalized understanding   HOME EXERCISE PROGRAM: Eval: AE education 10/20: how to make at home heat pack  10/27: wrist A/ROM and squeezing wash cloth for finger flexion   TREATMENT:   10/20  - Education: demonstrated use of button hook, practiced on button up shirt on table and on self. Went over joint protection strategies. Went over how to make at home heat pack with rice and sock.   10/27  - A/ROM: wrist seated at table flexion, extension, ulnar deviation, radial deviation, finger flexion (tabletop), thumb abduction 10x each direction  - Education: went over AE  - Strengthening: seated with hands on table, finger extension squeezing wash cloth into finger flexion 10x each hand, holding rolled up wash cloth  in had and squeezing 10x each hand        GOALS: Goals reviewed with patient? Yes  SHORT TERM GOALS: Target date: 01/07/2022    Pt will be provided with and educated on HEP to improve ability to grasp and manipulate items with bilateral hands.   Goal status: INITIAL  2.  Pt will be educated on AE available for use to improve independence in ADL completion and decrease pain in hands during tasks.   Goal status: INITIAL  3.  Pt will be educated on joint protection strategies to maintain joint integrity and decrease stress on bilateral hands during functional task completion.   Goal status: INITIAL  4.  Pt will decrease pain in bilateral hands to 5/10 or less to improve ability to sleep, waking due to pain less than 2x per night, using pain management strategies.   Goal status: INITIAL  5.  Pt will increase bilateral grip strength by 5# to improve  ability to grasp and carry lightweight objects during meal preparation and housekeeping tasks.   Goal status: INITIAL    ASSESSMENT:  CLINICAL IMPRESSION: Pt. Stated that she has been using heat as pain remedy and that has been helping. She brought in her adaptive jar opener that she purchased online because she was unable to work it. After attempting to open a large water bottle top for several minutes, determined that opener would not be beneficial due to requiring the use of smaller hand muscles verses larger muscles (opposite of joint protection strategies). Started gentle A/ROM and strengthening exercises this session. Gave handout for wrist ROM and hand exercises to complete at home.   PERFORMANCE DEFICITS in functional skills including ADLs, IADLs, sensation, strength, pain, endurance, and UE functional use  IMPAIRMENTS are limiting patient from ADLs, IADLs, rest and sleep, and leisure.   COMORBIDITIES may have co-morbidities  that affects occupational performance. Patient will benefit from skilled OT to address above impairments  and improve overall function.  MODIFICATION OR ASSISTANCE TO COMPLETE EVALUATION: No modification of tasks or assist necessary to complete an evaluation.  OT OCCUPATIONAL PROFILE AND HISTORY: Problem focused assessment: Including review of records relating to presenting problem.  CLINICAL DECISION MAKING: LOW - limited treatment options, no task modification necessary  REHAB POTENTIAL: Good  EVALUATION COMPLEXITY: Low      PLAN: OT FREQUENCY: 1x/week  OT DURATION: 4 weeks  PLANNED INTERVENTIONS: self care/ADL training, therapeutic exercise, therapeutic activity, paraffin, patient/family education, and DME and/or AE instructions  CONSULTED AND AGREED WITH PLAN OF CARE: Patient  PLAN FOR NEXT SESSION: Follow up on AE research, begin gentle grip strengthening and practice with button hook, go over joint protection strategies, follow up on [place], OTR/L  [phone] 12/10/2021, 1:42 PM

## 2021-12-13 ENCOUNTER — Telehealth: Payer: Self-pay

## 2021-12-13 NOTE — Telephone Encounter (Signed)
Patient would like to know if it's okay to take magnesium and omega 3.

## 2021-12-15 ENCOUNTER — Encounter (HOSPITAL_COMMUNITY): Payer: 59 | Admitting: Occupational Therapy

## 2021-12-17 ENCOUNTER — Ambulatory Visit (HOSPITAL_COMMUNITY): Payer: 59 | Attending: Family | Admitting: Occupational Therapy

## 2021-12-17 DIAGNOSIS — M25642 Stiffness of left hand, not elsewhere classified: Secondary | ICD-10-CM | POA: Diagnosis present

## 2021-12-17 DIAGNOSIS — M25641 Stiffness of right hand, not elsewhere classified: Secondary | ICD-10-CM

## 2021-12-17 DIAGNOSIS — M79641 Pain in right hand: Secondary | ICD-10-CM | POA: Diagnosis present

## 2021-12-17 DIAGNOSIS — M79642 Pain in left hand: Secondary | ICD-10-CM

## 2021-12-17 DIAGNOSIS — R29898 Other symptoms and signs involving the musculoskeletal system: Secondary | ICD-10-CM

## 2021-12-17 NOTE — Therapy (Unsigned)
OUTPATIENT OCCUPATIONAL THERAPY ORTHO TREATMENT  Patient Name: Stephanie Carroll MRN: [id number] DOB:January 25, 1956, 66 y.o., female Today's Date: 12/19/2021  PCP: Evelina Dun, FNP REFERRING PROVIDER: Evelina Dun, FNP     END OF SESSION:   12/17/21 1105  OT Visits / Re-Eval  Visit Number 4  Number of Visits 5  Date for OT Re-Evaluation 12/26/21  Authorization  Authorization Type [employer]; $20 copay  Progress Note Due on Visit 10  OT Time Calculation  OT Start Time 1023  OT Stop Time 1103  OT Time Calculation (min) 40 min  End of Session  Activity Tolerance Patient tolerated treatment well  Behavior During Therapy [facility] for tasks assessed/performed      Past Medical History:  Diagnosis Date   Allergy    Arthritis    DDD   Asthma    Colon cancer ([place]) 2008   Diabetes mellitus    Elevated liver enzymes    GERD (gastroesophageal reflux disease)    History of thyroiditis 12/18/2007   Formatting of this note might be different from the original. Thyroiditis   Hyperlipidemia    Iron deficiency anemia due to chronic blood loss 09/17/2020   Thyroid tumor, benign    Past Surgical History:  Procedure Laterality Date   BIOPSY THYROID     X2   CESAREAN SECTION     x 1   COLON SURGERY     FOOT FRACTURE SURGERY Left 02/2015   INCISIONAL HERNIA REPAIR     NASAL SEPTUM SURGERY     PARTIAL HYSTERECTOMY     POLYPECTOMY     UPPER GASTROINTESTINAL ENDOSCOPY     WRIST GANGLION EXCISION     left   Patient Active Problem List   Diagnosis Date Noted   Left thyroid nodule 10/29/2021   Type 2 diabetes mellitus with mild nonproliferative retinopathy without macular edema, with long-term current use of insulin ([place]) 10/29/2021   Type 2 diabetes mellitus with hyperglycemia, with long-term current use of insulin ([place]) 10/29/2021   Diabetic retinopathy ([place]) 02/24/2021   Vitamin B12 deficiency 02/24/2021   GAD (generalized anxiety disorder) 02/24/2021   Hypertension associated with type 2  diabetes mellitus ([place]) 10/23/2020   Iron deficiency anemia due to chronic blood loss 09/17/2020   Chronic asthma, mild persistent, uncomplicated 61/44/3154   Carpal tunnel syndrome of right wrist 11/06/2017   Cervical radiculopathy 10/20/2017   DDD (degenerative disc disease), cervical 10/20/2017   Environmental allergies 03/23/2015   Hand arthritis 03/23/2015   Multiple joint pain 03/23/2015   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   Type 2 diabetes mellitus without complication, with long-term current use of insulin ([place]) 03/23/2015   Internal hemorrhoids 00/86/7619   Non-alcoholic fatty liver disease 11/27/2013   Rectal bleeding 09/20/2013   Personal history of colon cancer, stage I 09/20/2013   Vitamin D deficiency 12/19/2008   History of malignant neoplasm of large intestine 08/04/2008   Hyperlipidemia associated with type 2 diabetes mellitus ([place]) 06/19/2008   Irritable bowel syndrome 03/19/2008   Diaphragmatic hernia 12/18/2007   Gastro-esophageal reflux disease without esophagitis 06/06/2007   Migraine headache 02/12/2007    ONSET DATE: Chronic  REFERRING DIAG: osteoarthritis of both hands  THERAPY DIAG:  Pain in left hand  Pain in right hand  Other symptoms and signs involving the musculoskeletal system  Stiffness of right hand, not elsewhere classified  Stiffness of left hand, not elsewhere classified  Rationale for Evaluation and Treatment Rehabilitation  SUBJECTIVE:   SUBJECTIVE STATEMENT: S: "My hands have hurt so  bad since my last session, I really didn't tolerate her pushing on my hands".   PERTINENT HISTORY: Pt is a 66 y/o female presenting with bilateral hand arthritis, seems to be worsening over the past three years. 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 Pt is having pain and numbness, burning sensation, reports that she has diabetes as well, wonders if this is contributing to the numbness.   PRECAUTIONS: Other: No  lifting over 10# due to hernia sx  WEIGHT BEARING RESTRICTIONS No  PAIN:  Are you having pain? Yes: NPRS scale: 7/10 Pain location: bilateral hands Pain description: aching Aggravating factors: cold, tedious tasks Relieving factors: heat  FALLS: Has patient fallen in last 6 months? No  PLOF: Independent  PATIENT GOALS To have less pain in her hands.   OBJECTIVE:   HAND DOMINANCE: Right   ADLs: Overall ADLs: Pt reports she is having difficulty with jewelry, opening jars or bottles, gripping items often dropping things and breaking them. Pt has stiffness in the mornings when she wakes up.    FUNCTIONAL OUTCOME MEASURES: Quick Dash: 63.64  UPPER EXTREMITY ROM       Pt with approximately 80-90% ROM in her digits, able to make ~85% fist.    HAND FUNCTION: Grip strength: Right: 2 lbs; Left: 10 lbs, Lateral pinch: Right: 5 lbs, Left: 7 lbs, and 3 point pinch: Right: 2 lbs, Left: 4 lbs  COORDINATION: 9 Hole Peg test: Right: 30.82" sec; Left: 34.78" sec   COGNITION: Overall cognitive status: Within functional limits for tasks assessed   PATIENT EDUCATION: Education details: Tendon Glides Person educated: Patient Education method: Explanation, Demonstration, and Handouts Education comprehension: verbalized understanding   HOME EXERCISE PROGRAM: Eval: AE education 10/20: how to make at home heat pack  10/27: wrist A/ROM and squeezing wash cloth for finger flexion  11/3: Tendon glides  TREATMENT:   12/17/21 -P/ROM: wrist flexion, extension, ulnar deviation, radial deviation, supination, pronation, digit composite flexion, digit blocking on each digit, 1x10 -Tendon Glide: wrist in neutral and digits extended, then digits flexed at MCP and PIP and DIP, then digits flexed at MCP and flexed at PIP and DIP, end up back in neutral with all digits extended, 1x10 -Digit Extensions: palms flat on table, extended each digit at MCP joint, 1x10 -A/ROM: wrist flexion, extension,  ulnar deviation, radial deviation, supination, pronation, digit composite flexion, 1x10 -Towel Scrunches, 1x10  10/20  - Education: demonstrated use of button hook, practiced on button up shirt on table and on self. Went over joint protection strategies. Went over how to make at home heat pack with rice and sock.   10/27  - A/ROM: wrist seated at table flexion, extension, ulnar deviation, radial deviation, finger flexion (tabletop), thumb abduction 10x each direction  - Education: went over AE  - Strengthening: seated with hands on table, finger extension squeezing wash cloth into finger flexion 10x each hand, holding rolled up wash cloth in had and squeezing 10x each hand        GOALS: Goals reviewed with patient? Yes  SHORT TERM GOALS: Target date: 01/16/2022    Pt will be provided with and educated on HEP to improve ability to grasp and manipulate items with bilateral hands.   Goal status: IN PROGRESS  2.  Pt will be educated on AE available for use to improve independence in ADL completion and decrease pain in hands during tasks.   Goal status: IN PROGRESS  3.  Pt will be educated on joint protection strategies to maintain joint integrity and decrease stress on bilateral hands during functional task completion.   Goal status: IN PROGRESS  4.  Pt will decrease pain in bilateral hands to 5/10 or less to improve ability to sleep, waking due to pain less than 2x per night, using pain management strategies.   Goal status: IN PROGRESS  5.  Pt will increase bilateral grip strength by 5# to improve ability to grasp and carry lightweight objects during meal preparation and housekeeping tasks.   Goal status: IN PROGRESS    ASSESSMENT:  CLINICAL IMPRESSION: Patient presenting to therapy with increased pain this session and limit A/ROM. Therapist provided P/ROM then educated pt on tendon glides, to assist pt with warming up. Pt was then able to complete A/ROM and towel scrunches.  Overall, therapist providing mod A for furthering ROM and education for joint protection and safety. Pt instructed to rest and use heat once home.   PLAN: OT FREQUENCY: 1x/week  OT DURATION: 4 weeks  PLANNED INTERVENTIONS: self care/ADL training, therapeutic exercise, therapeutic activity, paraffin, patient/family education, and DME and/or AE instructions  CONSULTED AND AGREED WITH PLAN OF CARE: Patient  PLAN FOR NEXT SESSION: Follow up on AE research, begin gentle grip strengthening and practice with button hook, go over joint protection strategies, follow up on HEP, Reassessment   Paulita Fujita, OTR/L [phone] 12/19/2021, 9:23 PM

## 2021-12-19 ENCOUNTER — Encounter (HOSPITAL_COMMUNITY): Payer: Self-pay | Admitting: Occupational Therapy

## 2021-12-20 DIAGNOSIS — Z01419 Encounter for gynecological examination (general) (routine) without abnormal findings: Secondary | ICD-10-CM | POA: Diagnosis not present

## 2021-12-24 ENCOUNTER — Ambulatory Visit (HOSPITAL_COMMUNITY): Payer: 59 | Admitting: Occupational Therapy

## 2021-12-24 DIAGNOSIS — M25641 Stiffness of right hand, not elsewhere classified: Secondary | ICD-10-CM

## 2021-12-24 DIAGNOSIS — R29898 Other symptoms and signs involving the musculoskeletal system: Secondary | ICD-10-CM

## 2021-12-24 DIAGNOSIS — M79642 Pain in left hand: Secondary | ICD-10-CM

## 2021-12-24 DIAGNOSIS — M79641 Pain in right hand: Secondary | ICD-10-CM

## 2021-12-24 DIAGNOSIS — M25642 Stiffness of left hand, not elsewhere classified: Secondary | ICD-10-CM

## 2021-12-24 NOTE — Therapy (Signed)
OUTPATIENT OCCUPATIONAL THERAPY ORTHO TREATMENT AND DISCHARGE  Patient Name: ELEENA GRATER MRN: [id number] DOB:[birth year], 66 y.o., female Today's Date: 12/26/2021  PCP: Evelina Dun, FNP REFERRING PROVIDER: Evelina Dun, FNP   OCCUPATIONAL THERAPY DISCHARGE SUMMARY  Visits from Start of Care: 5  Current functional level related to goals / functional outcomes: Patient has met 3/5 goals. Of these 3 goals, she has been provided a comprehensive HEP to continue exercises at home, she has been educated on multiple AE for different ADL and IADL tasks she expressed difficulty with, and she has been educated and provided a handout on joint protection strategies.    Remaining deficits: Pt was unable to meet 2/5 goals. These goals were decreasing pain to 5/10 or less and improving grip strength. Her pain on average continues to be around 7/10, which is the nature of her diagnosis. As far as strengthening, she continues to be limited due to pain and fatigue, her strength remains consistent from evaluation measures.    Education / Equipment: Comprehensive HEP, as well as pain management strategies, joint protection strategies, and AE for continued independence with difficult tasks.    Plan: Patient agrees to discharge as pain is a continuing factor and she has been provided extensive education on AE, as well as an HEP.     End of Session:  12/24/21 1200  OT Visits / Re-Eval  Visit Number 5  Number of Visits 5  Date for OT Re-Evaluation 12/26/21  Authorization  Authorization Type [employer]; $20 copay  Progress Note Due on Visit 10  OT Time Calculation  OT Start Time 1110  OT Stop Time 1155  OT Time Calculation (min) 45 min  End of Session  Activity Tolerance Patient tolerated treatment well  Behavior During Therapy [facility] for tasks assessed/performed    Past Medical History:  Diagnosis Date   Allergy    Arthritis    DDD   Asthma    Colon cancer ([place]) 2008   Diabetes mellitus     Elevated liver enzymes    GERD (gastroesophageal reflux disease)    History of thyroiditis 12/18/2007   Formatting of this note might be different from the original. Thyroiditis   Hyperlipidemia    Iron deficiency anemia due to chronic blood loss 09/17/2020   Thyroid tumor, benign    Past Surgical History:  Procedure Laterality Date   BIOPSY THYROID     X2   CESAREAN SECTION     x 1   COLON SURGERY     FOOT FRACTURE SURGERY Left 02/2015   INCISIONAL HERNIA REPAIR     NASAL SEPTUM SURGERY     PARTIAL HYSTERECTOMY     POLYPECTOMY     UPPER GASTROINTESTINAL ENDOSCOPY     WRIST GANGLION EXCISION     left   Patient Active Problem List   Diagnosis Date Noted   Left thyroid nodule 10/29/2021   Type 2 diabetes mellitus with mild nonproliferative retinopathy without macular edema, with long-term current use of insulin ([place]) 10/29/2021   Type 2 diabetes mellitus with hyperglycemia, with long-term current use of insulin ([place]) 10/29/2021   Diabetic retinopathy ([place]) 02/24/2021   Vitamin B12 deficiency 02/24/2021   GAD (generalized anxiety disorder) 02/24/2021   Hypertension associated with type 2 diabetes mellitus ([place]) 10/23/2020   Iron deficiency anemia due to chronic blood loss 09/17/2020   Chronic asthma, mild persistent, uncomplicated 53/97/6734   Carpal tunnel syndrome of right wrist 11/06/2017   Cervical radiculopathy 10/20/2017   DDD (degenerative disc  disease), cervical 10/20/2017   Environmental allergies 03/23/2015   Hand arthritis 03/23/2015   Multiple joint pain 03/23/2015   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   Type 2 diabetes mellitus without complication, with long-term current use of insulin ([place]) 03/23/2015   Internal hemorrhoids 63/14/9702   Non-alcoholic fatty liver disease 11/27/2013   Rectal bleeding 09/20/2013   Personal history of colon cancer, stage I 09/20/2013   Vitamin D deficiency 12/19/2008   History of malignant neoplasm of large intestine 08/04/2008    Hyperlipidemia associated with type 2 diabetes mellitus ([place]) 06/19/2008   Irritable bowel syndrome 03/19/2008   Diaphragmatic hernia 12/18/2007   Gastro-esophageal reflux disease without esophagitis 06/06/2007   Migraine headache 02/12/2007    ONSET DATE: Chronic  REFERRING DIAG: osteoarthritis of both hands  THERAPY DIAG:  Pain in left hand  Pain in right hand  Stiffness of right hand, not elsewhere classified  Stiffness of left hand, not elsewhere classified  Other symptoms and signs involving the musculoskeletal system  Rationale for Evaluation and Treatment Rehabilitation  SUBJECTIVE:   SUBJECTIVE STATEMENT: S: "My hands have been pretty sore, but I was able to use the potato peeler and peel my potatoes for dinner."   PERTINENT HISTORY: Pt is a 66 y/o female presenting with bilateral hand arthritis, seems to be worsening over the past three years. 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 Pt is having pain and numbness, burning sensation, reports that she has diabetes as well, wonders if this is contributing to the numbness.   PRECAUTIONS: Other: No lifting over 10# due to hernia sx  WEIGHT BEARING RESTRICTIONS No  PAIN:  Are you having pain? Yes: NPRS scale: 7/10 Pain location: bilateral hands Pain description: aching Aggravating factors: cold, tedious tasks Relieving factors: heat  FALLS: Has patient fallen in last 6 months? No  PLOF: Independent  PATIENT GOALS To have less pain in her hands.   OBJECTIVE:   HAND DOMINANCE: Right   ADLs: Overall ADLs: Pt reports she is having difficulty with jewelry, opening jars or bottles, gripping items often dropping things and breaking them. Pt has stiffness in the mornings when she wakes up.    FUNCTIONAL OUTCOME MEASURES: Quick Dash: 63.64 12/24/21: 52.52  UPPER EXTREMITY ROM       Pt with approximately 80-90% ROM in her digits, able to make ~85% fist.     12/24/21: continued 80-90% ROM in her digits, able to make ~85% fist.    HAND FUNCTION: Grip strength: Right: 2 lbs; Left: 10 lbs, Lateral pinch: Right: 5 lbs, Left: 7 lbs, and 3 point pinch: Right: 2 lbs, Left: 4 lbs 12/24/21: Grip strength: Right: 3 lbs; Left: 5 lbs, Lateral pinch: Right: 2 lbs, Left: 3 lbs, and 3 point pinch: Right: 1 lbs, Left: 2 lbs - used red dynamometer and different pinch dynamometer  COORDINATION: 9 Hole Peg test: Right: 30.82" sec; Left: 34.78" sec 12/24/21:Right: 48.27" sec; Left: 37.46" sec   COGNITION: Overall cognitive status: Within functional limits for tasks assessed   PATIENT EDUCATION: Education details: Review HEP Person educated: Patient Education method: Consulting civil engineer, Demonstration, and Handouts Education comprehension: verbalized understanding   HOME EXERCISE PROGRAM: Eval: AE education 10/20: how to make at home heat pack  10/27: wrist A/ROM and squeezing wash cloth for finger flexion  11/3: Tendon glides  TREATMENT:   12/24/21 -Towel Scrunches, 1x10 -A/ROM: wrist flexion, extension, ulnar deviation, radial deviation, supination, pronation, digit composite flexion, 1x10 -Tendon  Glide: wrist in neutral and digits extended, then digits flexed at MCP and PIP and DIP, then digits flexed at MCP and flexed at PIP and DIP, end up back in neutral with all digits extended, 1x10  12/17/21 -P/ROM: wrist flexion, extension, ulnar deviation, radial deviation, supination, pronation, digit composite flexion, digit blocking on each digit, 1x10 -Tendon Glide: wrist in neutral and digits extended, then digits flexed at MCP and PIP and DIP, then digits flexed at MCP and flexed at PIP and DIP, end up back in neutral with all digits extended, 1x10 -Digit Extensions: palms flat on table, extended each digit at MCP joint, 1x10 -A/ROM: wrist flexion, extension, ulnar deviation, radial deviation, supination, pronation, digit composite flexion, 1x10 -Towel  Scrunches, 1x10  10/27 - A/ROM: wrist seated at table flexion, extension, ulnar deviation, radial deviation, finger flexion (tabletop), thumb abduction 10x each direction  - Education: went over AE  - Strengthening: seated with hands on table, finger extension squeezing wash cloth into finger flexion 10x each hand, holding rolled up wash cloth in had and squeezing 10x each hand        GOALS: Goals reviewed with patient? Yes  SHORT TERM GOALS: Target date: 01/23/2022    Pt will be provided with and educated on HEP to improve ability to grasp and manipulate items with bilateral hands.   Goal status: MET  2.  Pt will be educated on AE available for use to improve independence in ADL completion and decrease pain in hands during tasks.   Goal status: MET  3.  Pt will be educated on joint protection strategies to maintain joint integrity and decrease stress on bilateral hands during functional task completion.   Goal status: MET  4.  Pt will decrease pain in bilateral hands to 5/10 or less to improve ability to sleep, waking due to pain less than 2x per night, using pain management strategies.   Goal status: NOT MET  5.  Pt will increase bilateral grip strength by 5# to improve ability to grasp and carry lightweight objects during meal preparation and housekeeping tasks.   Goal status: NOT MET    ASSESSMENT:  CLINICAL IMPRESSION: This session pt was seen for a reassessment and discharge. Therapist reviewed all goals with pt, as well as measurements for ROM and Quickdash. Pt appears to have consistent results with little change from evaluation taken 5 weeks ago. Therapist reviewed all HEP, as well as final education on joint protection strategies while she completed a few exercises as a warm up prior to being measured. Pt reports that she has received thorough education on AE and HEP and feels fully capable to continue working on her pain and finding further AE for independence.     PLAN: OT FREQUENCY: Discharge   Paulita Fujita, OTR/L [phone] 12/26/2021, 10:26 PM

## 2021-12-26 ENCOUNTER — Encounter (HOSPITAL_COMMUNITY): Payer: Self-pay | Admitting: Occupational Therapy

## 2021-12-30 ENCOUNTER — Other Ambulatory Visit (HOSPITAL_COMMUNITY): Payer: Self-pay

## 2021-12-30 ENCOUNTER — Encounter: Payer: Self-pay | Admitting: Hematology & Oncology

## 2021-12-30 ENCOUNTER — Ambulatory Visit: Payer: 59 | Admitting: Nutrition

## 2021-12-30 ENCOUNTER — Other Ambulatory Visit: Payer: Self-pay | Admitting: Gastroenterology

## 2022-01-04 ENCOUNTER — Encounter: Payer: Self-pay | Admitting: Hematology & Oncology

## 2022-01-04 ENCOUNTER — Other Ambulatory Visit (HOSPITAL_COMMUNITY): Payer: Self-pay

## 2022-01-04 ENCOUNTER — Telehealth: Payer: Self-pay | Admitting: Gastroenterology

## 2022-01-04 NOTE — Telephone Encounter (Signed)
Patient is calling states that insurance needs authorization from Dr Silverio Decamp to be able to fill her Aciphex Rx. Please advise

## 2022-01-04 NOTE — Telephone Encounter (Signed)
Pharmacy Patient Advocate Encounter   Received notification from [facility] that prior authorization for Rabeprazole is required/requested.  Per Test Claim: pt can only get 90 tabs per year and only has 30 left.    PA submitted on 01/04/22 to [place] via CoverMyMeds Key [id number]  Status is approved.  PA# PA Case ID: [id number] Approved per [facility]- more info will be provided in the approval communication.  Patient's copay is $10.  Spoke with Pharmacy to process.

## 2022-01-05 ENCOUNTER — Other Ambulatory Visit (HOSPITAL_COMMUNITY): Payer: Self-pay

## 2022-01-07 NOTE — Telephone Encounter (Signed)
Patient Advocate Encounter  Prior Authorization for RABEPRAZOLE 20MG has been approved.    PA# [id number] SS Effective dates: 11.21.23 through 11.20.24

## 2022-01-14 ENCOUNTER — Ambulatory Visit: Payer: 59 | Admitting: Nurse Practitioner

## 2022-01-14 ENCOUNTER — Encounter: Payer: Self-pay | Admitting: Nurse Practitioner

## 2022-01-14 VITALS — BP 135/76 | HR 105 | Temp 98.7°F | Ht 61.0 in | Wt 145.0 lb

## 2022-01-14 DIAGNOSIS — J0110 Acute frontal sinusitis, unspecified: Secondary | ICD-10-CM

## 2022-01-14 MED ORDER — DOXYCYCLINE HYCLATE 100 MG PO TABS: 100.0000 mg | ORAL_TABLET | Freq: Two times a day (BID) | ORAL | 0 refills | Status: DC

## 2022-01-14 NOTE — Progress Notes (Signed)
Acute Office Visit  Subjective:     Patient ID: Stephanie Carroll Slight, female    DOB: [birth year], 66 y.o.   MRN: [id number]  Chief Complaint  Patient presents with   Ear Pain    Pt states it started this week , pain goes to the back of her neck     Sinusitis This is a new problem. Episode onset: In the past 2 to days. The problem has been gradually worsening since onset. There has been no fever. Associated symptoms include chills, headaches and sinus pressure. Pertinent negatives include no congestion, coughing or ear pain. Past treatments include acetaminophen. The treatment provided no relief.     Review of Systems  Constitutional:  Positive for chills.  HENT:  Positive for sinus pressure. Negative for congestion and ear pain.   Respiratory:  Negative for cough.   Cardiovascular: Negative.   Skin: Negative.   Neurological:  Positive for headaches.  All other systems reviewed and are negative.       Objective:    BP 135/76   Pulse (!) 105   Temp 98.7 F (37.1 C)   Ht 5' 1"  (1.549 m)   Wt 145 lb (65.8 kg)   SpO2 96%   BMI 27.40 kg/m  BP Readings from Last 3 Encounters:  01/14/22 135/76  10/29/21 (!) 146/92  10/19/21 137/67      Physical Exam Vitals and nursing note reviewed.  Constitutional:      Appearance: Normal appearance.  HENT:     Right Ear: External ear normal.     Left Ear: External ear normal.     Nose: Congestion present.     Mouth/Throat:     Mouth: Mucous membranes are moist.     Pharynx: Oropharynx is clear.  Eyes:     Conjunctiva/sclera: Conjunctivae normal.  Cardiovascular:     Rate and Rhythm: Normal rate.     Pulses: Normal pulses.     Heart sounds: Normal heart sounds.  Pulmonary:     Effort: Pulmonary effort is normal.     Breath sounds: Normal breath sounds.  Abdominal:     General: Bowel sounds are normal.  Skin:    General: Skin is warm.     Findings: No erythema or rash.  Neurological:     Mental Status: She is alert and  oriented to person, place, and time.  Psychiatric:        Mood and Affect: Mood normal.        Behavior: Behavior normal.     No results found for any visits on 01/14/22.      Assessment & Plan:  Patient presents with sinus pressure, pain and ear fullness for the past 2 to 3 days.  Symptoms presents as sinusitis.  No fever, mild chills.  Advised patient to Take meds as prescribed - Use a cool mist humidifier  -Use saline nose sprays frequently -Force fluids -For fever or aches or pains- take Tylenol or ibuprofen. -If symptoms do not improve, she may need to be COVID tested to rule this out Follow up with worsening unresolved symptoms  Problem List Items Addressed This Visit   None Visit Diagnoses     Acute non-recurrent frontal sinusitis    -  Primary   Relevant Medications   doxycycline (VIBRA-TABS) 100 MG tablet       Meds ordered this encounter  Medications   doxycycline (VIBRA-TABS) 100 MG tablet    Sig: Take 1 tablet (100 mg total) by  mouth 2 (two) times daily.    Dispense:  14 tablet    Refill:  0    Order Specific Question:   Supervising Provider    Answer:   Claretta Fraise [[id number]]    Return sinusities.  Ivy Lynn, NP

## 2022-01-14 NOTE — Patient Instructions (Signed)

## 2022-01-18 ENCOUNTER — Ambulatory Visit: Payer: 59 | Admitting: Family

## 2022-01-21 IMAGING — DX DG CHEST 1V PORT
1 series · 1 of 1 positions shown · non-contrast
Comparison: 01/14/2020

CLINICAL DATA: Acute left chest pain

EXAM:
PORTABLE CHEST 1 VIEW

[chest ap]
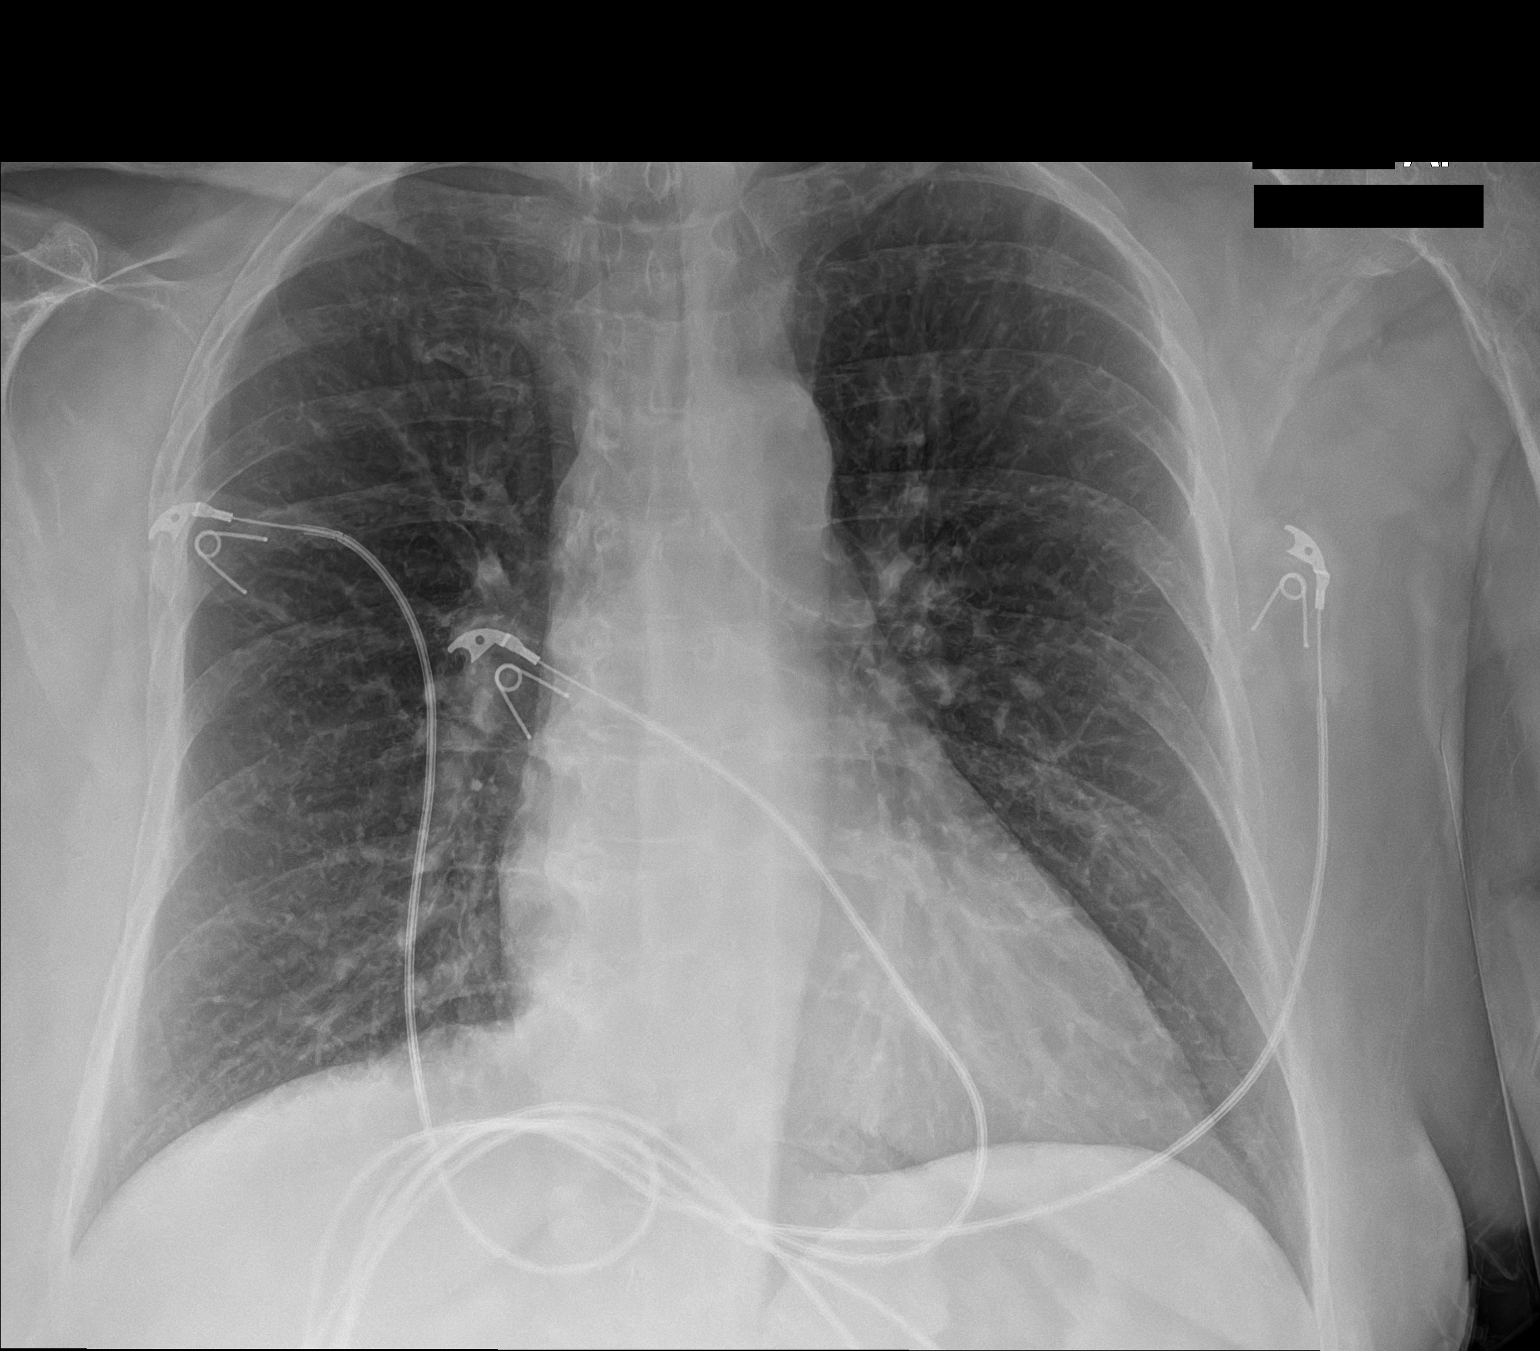

[1 of 1 positions shown; findings below may reference images not displayed]

FINDINGS: The heart size and mediastinal contours are within normal limits.
Both lungs are clear. The visualized skeletal structures are
unremarkable.
IMPRESSION: No active disease.

## 2022-01-25 DIAGNOSIS — M810 Age-related osteoporosis without current pathological fracture: Secondary | ICD-10-CM | POA: Diagnosis not present

## 2022-01-25 DIAGNOSIS — Z780 Asymptomatic menopausal state: Secondary | ICD-10-CM | POA: Diagnosis not present

## 2022-01-25 DIAGNOSIS — Z1231 Encounter for screening mammogram for malignant neoplasm of breast: Secondary | ICD-10-CM | POA: Diagnosis not present

## 2022-01-31 ENCOUNTER — Encounter: Payer: Self-pay | Admitting: Family

## 2022-01-31 ENCOUNTER — Encounter: Payer: 59 | Admitting: Nutrition

## 2022-02-21 ENCOUNTER — Encounter: Payer: Self-pay | Admitting: Family

## 2022-02-21 ENCOUNTER — Ambulatory Visit: Payer: 59 | Admitting: Family

## 2022-02-21 VITALS — BP 147/78 | HR 105 | Ht 61.0 in | Wt 143.0 lb

## 2022-02-21 DIAGNOSIS — K760 Fatty (change of) liver, not elsewhere classified: Secondary | ICD-10-CM | POA: Diagnosis not present

## 2022-02-21 DIAGNOSIS — E785 Hyperlipidemia, unspecified: Secondary | ICD-10-CM

## 2022-02-21 DIAGNOSIS — E538 Deficiency of other specified B group vitamins: Secondary | ICD-10-CM | POA: Diagnosis not present

## 2022-02-21 DIAGNOSIS — J4530 Mild persistent asthma, uncomplicated: Secondary | ICD-10-CM | POA: Diagnosis not present

## 2022-02-21 DIAGNOSIS — I152 Hypertension secondary to endocrine disorders: Secondary | ICD-10-CM | POA: Diagnosis not present

## 2022-02-21 DIAGNOSIS — E1165 Type 2 diabetes mellitus with hyperglycemia: Secondary | ICD-10-CM

## 2022-02-21 DIAGNOSIS — E1169 Type 2 diabetes mellitus with other specified complication: Secondary | ICD-10-CM

## 2022-02-21 DIAGNOSIS — E1159 Type 2 diabetes mellitus with other circulatory complications: Secondary | ICD-10-CM | POA: Diagnosis not present

## 2022-02-21 DIAGNOSIS — R69 Illness, unspecified: Secondary | ICD-10-CM | POA: Diagnosis not present

## 2022-02-21 DIAGNOSIS — K219 Gastro-esophageal reflux disease without esophagitis: Secondary | ICD-10-CM | POA: Diagnosis not present

## 2022-02-21 DIAGNOSIS — D500 Iron deficiency anemia secondary to blood loss (chronic): Secondary | ICD-10-CM

## 2022-02-21 DIAGNOSIS — F411 Generalized anxiety disorder: Secondary | ICD-10-CM | POA: Diagnosis not present

## 2022-02-21 DIAGNOSIS — Z794 Long term (current) use of insulin: Secondary | ICD-10-CM

## 2022-02-21 DIAGNOSIS — E559 Vitamin D deficiency, unspecified: Secondary | ICD-10-CM | POA: Diagnosis not present

## 2022-02-21 LAB — BAYER DCA HB A1C WAIVED: HB A1C (BAYER DCA - WAIVED): 9.3 % — ABNORMAL HIGH (ref 4.8–5.6)

## 2022-02-21 NOTE — Patient Instructions (Signed)

## 2022-02-21 NOTE — Progress Notes (Signed)
Subjective:    Patient ID: Stephanie Carroll, female    DOB: 1955-07-14, 67 y.o.   MRN: [id number]  Chief Complaint  Patient presents with   Medical Management of Chronic Issues   PT presents to the office today for chronic follow up. She is followed by Endocrinologists every 6 months for diabetes. She is followed by asthma and allergen specialists annually. Followed by GI annually for GERD and fatty liver. Has hx of colon cancer and has colonoscopy every 3 years.  Diabetes She presents for her follow-up diabetic visit. She has type 2 diabetes mellitus. Associated symptoms include blurred vision. Pertinent negatives for diabetes include no foot paresthesias. Symptoms are stable. Diabetic complications include retinopathy. Pertinent negatives for diabetic complications include no peripheral neuropathy. Risk factors for coronary artery disease include dyslipidemia, diabetes mellitus, hypertension, sedentary lifestyle and post-menopausal. She is following a generally healthy diet. Her overall blood glucose range is 140-180 mg/dl. Eye exam is current.  Asthma She complains of cough, shortness of breath and wheezing. This is a chronic problem. The current episode started more than 1 year ago. The problem occurs intermittently. The problem has been waxing and waning. Associated symptoms include heartburn and malaise/fatigue. Her symptoms are alleviated by rest. She reports moderate improvement on treatment. Her symptoms are not alleviated by rest. Her past medical history is significant for asthma.  Hypertension This is a chronic problem. The current episode started more than 1 year ago. The problem has been waxing and waning since onset. The problem is uncontrolled. Associated symptoms include blurred vision, malaise/fatigue and shortness of breath. Pertinent negatives include no peripheral edema. Risk factors for coronary artery disease include dyslipidemia, diabetes mellitus and sedentary lifestyle. The  current treatment provides moderate improvement. Hypertensive end-organ damage includes retinopathy.  Gastroesophageal Reflux She complains of belching, coughing, heartburn and wheezing. This is a chronic problem. The current episode started more than 1 year ago. The problem occurs occasionally. She has tried an antacid for the symptoms. The treatment provided mild relief.      Review of Systems  Constitutional:  Positive for malaise/fatigue.  Eyes:  Positive for blurred vision.  Respiratory:  Positive for cough, shortness of breath and wheezing.   Gastrointestinal:  Positive for heartburn.  All other systems reviewed and are negative.      Objective:   Physical Exam Vitals reviewed.  Constitutional:      General: She is not in acute distress.    Appearance: She is well-developed.  HENT:     Head: Normocephalic and atraumatic.     Right Ear: Tympanic membrane normal.     Left Ear: Tympanic membrane normal.  Eyes:     Pupils: Pupils are equal, round, and reactive to light.  Neck:     Thyroid: No thyromegaly.  Cardiovascular:     Rate and Rhythm: Normal rate and regular rhythm.     Heart sounds: Normal heart sounds. No murmur heard. Pulmonary:     Effort: Pulmonary effort is normal. No respiratory distress.     Breath sounds: Normal breath sounds. No wheezing.  Abdominal:     General: Bowel sounds are normal. There is no distension.     Palpations: Abdomen is soft.     Tenderness: There is no abdominal tenderness.  Musculoskeletal:        General: No tenderness. Normal range of motion.     Cervical back: Normal range of motion and neck supple.  Skin:    General: Skin is warm and  dry.  Neurological:     Mental Status: She is alert and oriented to person, place, and time.     Cranial Nerves: No cranial nerve deficit.     Deep Tendon Reflexes: Reflexes are normal and symmetric.  Psychiatric:        Behavior: Behavior normal.        Thought Content: Thought content  normal.        Judgment: Judgment normal.       BP (!) 147/78   Pulse (!) 105   Ht '5\' 1"'$  (1.549 m)   Wt 143 lb (64.9 kg)   SpO2 98%   BMI 27.02 kg/m      Assessment & Plan:  Stephanie Carroll comes in today with chief complaint of Medical Management of Chronic Issues   Diagnosis and orders addressed:  1. Chronic asthma, mild persistent, uncomplicated - PET62+OECX - For home use only DME Nebulizer machine  2. GAD (generalized anxiety disorder) - CMP14+EGFR  3. Gastro-esophageal reflux disease without esophagitis - CMP14+EGFR  4. Hyperlipidemia associated with type 2 diabetes mellitus (HCC) - CMP14+EGFR  5. Hypertension associated with type 2 diabetes mellitus (HCC) - CMP14+EGFR  6. Iron deficiency anemia due to chronic blood loss - FQH22+VJDY  7. Non-alcoholic fatty liver disease - CMP14+EGFR  8. Type 2 diabetes mellitus with hyperglycemia, with long-term current use of insulin (HCC) - CMP14+EGFR - Bayer DCA Hb A1c Waived  9. Vitamin D deficiency - CMP14+EGFR  10. Vitamin B12 deficiency - CMP14+EGFR   Labs pending Health Maintenance reviewed Diet and exercise encouraged  Follow up plan: 6 months    Evelina Dun, FNP

## 2022-02-22 ENCOUNTER — Other Ambulatory Visit: Payer: Self-pay | Admitting: Family

## 2022-02-22 DIAGNOSIS — E1159 Type 2 diabetes mellitus with other circulatory complications: Secondary | ICD-10-CM

## 2022-02-22 DIAGNOSIS — E113299 Type 2 diabetes mellitus with mild nonproliferative diabetic retinopathy without macular edema, unspecified eye: Secondary | ICD-10-CM

## 2022-02-22 LAB — CMP14+EGFR
ALT: 57 IU/L — ABNORMAL HIGH (ref 0–32)
AST: 34 IU/L (ref 0–40)
Albumin/Globulin Ratio: 1.7 (ref 1.2–2.2)
Albumin: 4.7 g/dL (ref 3.9–4.9)
Alkaline Phosphatase: 94 IU/L (ref 44–121)
BUN/Creatinine Ratio: 11 — ABNORMAL LOW (ref 12–28)
BUN: 9 mg/dL (ref 8–27)
Bilirubin Total: 0.3 mg/dL (ref 0.0–1.2)
CO2: 20 mmol/L (ref 20–29)
Calcium: 9.8 mg/dL (ref 8.7–10.3)
Chloride: 100 mmol/L (ref 96–106)
Creatinine, Ser: 0.8 mg/dL (ref 0.57–1.00)
Globulin, Total: 2.8 g/dL (ref 1.5–4.5)
Glucose: 186 mg/dL — ABNORMAL HIGH (ref 70–99)
Potassium: 4.4 mmol/L (ref 3.5–5.2)
Sodium: 138 mmol/L (ref 134–144)
Total Protein: 7.5 g/dL (ref 6.0–8.5)
eGFR: 82 mL/min/{1.73_m2} (ref >59)

## 2022-02-22 MED ORDER — SEMAGLUTIDE(0.25 OR 0.5MG/DOS) 2 MG/3ML ~~LOC~~ SOPN: 0.2500 mg | PEN_INJECTOR | SUBCUTANEOUS | 2 refills | Status: DC

## 2022-02-23 ENCOUNTER — Other Ambulatory Visit (HOSPITAL_COMMUNITY): Payer: Self-pay

## 2022-02-23 ENCOUNTER — Encounter: Payer: Self-pay | Admitting: Hematology & Oncology

## 2022-02-23 NOTE — Telephone Encounter (Signed)
Per test bill, PA not required

## 2022-02-27 ENCOUNTER — Emergency Department (HOSPITAL_COMMUNITY)
Admission: EM | Admit: 2022-02-27 | Discharge: 2022-02-27 | Disposition: A | Discharge status: Home / Self Care | Payer: 59 | Attending: Emergency Medicine | Admitting: Emergency Medicine

## 2022-02-27 ENCOUNTER — Emergency Department (HOSPITAL_COMMUNITY): Payer: 59

## 2022-02-27 DIAGNOSIS — Z794 Long term (current) use of insulin: Secondary | ICD-10-CM | POA: Insufficient documentation

## 2022-02-27 DIAGNOSIS — J45909 Unspecified asthma, uncomplicated: Secondary | ICD-10-CM | POA: Diagnosis not present

## 2022-02-27 DIAGNOSIS — R000 Tachycardia, unspecified: Secondary | ICD-10-CM | POA: Diagnosis not present

## 2022-02-27 DIAGNOSIS — I10 Essential (primary) hypertension: Secondary | ICD-10-CM | POA: Diagnosis not present

## 2022-02-27 DIAGNOSIS — Z91010 Allergy to peanuts: Secondary | ICD-10-CM | POA: Diagnosis not present

## 2022-02-27 DIAGNOSIS — E119 Type 2 diabetes mellitus without complications: Secondary | ICD-10-CM | POA: Diagnosis not present

## 2022-02-27 DIAGNOSIS — R0602 Shortness of breath: Secondary | ICD-10-CM | POA: Diagnosis not present

## 2022-02-27 DIAGNOSIS — Z79899 Other long term (current) drug therapy: Secondary | ICD-10-CM | POA: Insufficient documentation

## 2022-02-27 DIAGNOSIS — Z7984 Long term (current) use of oral hypoglycemic drugs: Secondary | ICD-10-CM | POA: Insufficient documentation

## 2022-02-27 DIAGNOSIS — R030 Elevated blood-pressure reading, without diagnosis of hypertension: Secondary | ICD-10-CM | POA: Diagnosis not present

## 2022-02-27 LAB — CBC WITH DIFFERENTIAL/PLATELET
Abs Immature Granulocytes: 0.04 10*3/uL (ref 0.00–0.07)
Basophils Absolute: 0.1 10*3/uL (ref 0.0–0.1)
Basophils Relative: 1 %
Eosinophils Absolute: 0.5 10*3/uL (ref 0.0–0.5)
Eosinophils Relative: 5 %
HCT: 41.9 % (ref 36.0–46.0)
Hemoglobin: 13.8 g/dL (ref 12.0–15.0)
Immature Granulocytes: 0 %
Lymphocytes Relative: 32 %
Lymphs Abs: 3.2 10*3/uL (ref 0.7–4.0)
MCH: 30.3 pg (ref 26.0–34.0)
MCHC: 32.9 g/dL (ref 30.0–36.0)
MCV: 92.1 fL (ref 80.0–100.0)
Monocytes Absolute: 0.6 10*3/uL (ref 0.1–1.0)
Monocytes Relative: 6 %
Neutro Abs: 5.6 10*3/uL (ref 1.7–7.7)
Neutrophils Relative %: 56 %
Platelets: 217 10*3/uL (ref 150–400)
RBC: 4.55 MIL/uL (ref 3.87–5.11)
RDW: 12.8 % (ref 11.5–15.5)
WBC: 10.1 10*3/uL (ref 4.0–10.5)
nRBC: 0 % (ref 0.0–0.2)

## 2022-02-27 LAB — BASIC METABOLIC PANEL
Anion gap: 15 (ref 5–15)
BUN: 26 mg/dL — ABNORMAL HIGH (ref 8–23)
CO2: 18 mmol/L — ABNORMAL LOW (ref 22–32)
Calcium: 9.5 mg/dL (ref 8.9–10.3)
Chloride: 105 mmol/L (ref 98–111)
Creatinine, Ser: 0.87 mg/dL (ref 0.44–1.00)
GFR, Estimated: >60 mL/min (ref >60)
Glucose, Bld: 182 mg/dL — ABNORMAL HIGH (ref 70–99)
Potassium: 4.3 mmol/L (ref 3.5–5.1)
Sodium: 138 mmol/L (ref 135–145)

## 2022-02-27 LAB — TROPONIN I (HIGH SENSITIVITY)
Troponin I (High Sensitivity): 3 ng/L (ref <18)
Troponin I (High Sensitivity): 7 ng/L (ref <18)

## 2022-02-27 NOTE — Discharge Instructions (Signed)
Please return to the ED with any new symptoms such as chest pain, blurred vision, headache Please begin recording her blood pressure utilizing the form attached to this packet.  Please measure it 3 times daily and then take this form to your PCP. Please read attached guide concerning hypertension

## 2022-02-27 NOTE — ED Provider Triage Note (Signed)
Emergency Medicine Provider Triage Evaluation Note  Stephanie Carroll , a 67 y.o. female  was evaluated in triage.  Pt complains of HTN.  States a lot of SOB recently-- tested negative for covid/flu and was started on steroids which caused elevated BP and CBG.  States doctor stopped steroids, now on biaxin but pressure tonight 210/110.  Denies chest pain.  Review of Systems  Positive: HTN Negative: fever  Physical Exam  BP (!) 157/78 (BP Location: Right Arm)   Pulse (!) 109   Temp 98.4 F (36.9 C)   Resp 20   SpO2 99%   Gen:   Awake, no distress   Resp:  Normal effort  MSK:   Moves extremities without difficulty  Other:    Medical Decision Making  Medically screening exam initiated at 6:14 AM.  Appropriate orders placed.  Stephanie Carroll was informed that the remainder of the evaluation will be completed by another provider, this initial triage assessment does not replace that evaluation, and the importance of remaining in the ED until their evaluation is complete.  BP 157/78 in triage.  No chest pain reported but still feels SOB.  Negative covid/flu testing.  EKG, labs, CXR.   Larene Pickett, PA-C 02/27/22 [phone]

## 2022-02-27 NOTE — ED Triage Notes (Signed)
Pt in with hypertension - (210/110 on EMS arrival). Pt has been on Biaxin and Prednisone for bronchitis, but has stopped due to elevated blood sugars.  148/72 116 18 97% RA

## 2022-02-27 NOTE — ED Provider Notes (Signed)
[facility] EMERGENCY DEPARTMENT Provider Note   CSN: [id number] Arrival date & time: 02/27/22  2549     History  Chief Complaint  Patient presents with   Hypertension    Stephanie Carroll is a 67 y.o. female with medical history of allergies, arthritis, asthma, diabetes, GERD.  Patient presents to ED for evaluation of hypertension.  Patient reports that this morning around 1 AM she woke up very sweaty.  Patient reports that she checked her blood pressure at this time and noted it to be elevated at 826 systolic.  Patient states that her mouth became very dry at this point.  The patient states that she became very nervous and called EMS who then transported her to the ED.  With EMS the patient's blood pressure was 210/110.  Patient denies any history of hypertension.  The patient reports remote history 20 years ago for which she was placed on medication she cannot remember the name of however states that she was taken off this medication after 6 months.  Patient also reports that at 1 point she was given Ativan for anxiety however her new doctor has taken her off of this.  Patient states that after EMS arrived and transported her to the ED, she felt better.  Patient blood pressure here in the department on latest reading is 112/61 with a pulse rate of 80.  The patient denies any chest pain, blurred vision, headache.  Patient does endorse a remote feeling of shortness of breath however attributes this to having to wear a mask.  When the patient took her mask off, she states that her shortness of breath resolved.  Patient also states that she was recently placed on steroids for bronchitis however stopped these due to elevated blood sugars.  Patient was also viral tested at her appointment last week, tested negative.  Patient denies any bodyaches or chills, sore throat, cough, fatigue.   Hypertension Associated symptoms include shortness of breath. Pertinent negatives include no chest  pain and no headaches.       Home Medications Prior to Admission medications   Medication Sig Start Date End Date Taking? Authorizing Provider  Semaglutide,0.25 or 0.'5MG'$ /DOS, 2 MG/3ML SOPN Inject 0.25 mg into the skin once a week. 02/22/22   Sharion Balloon, FNP  albuterol (VENTOLIN HFA) 108 (90 Base) MCG/ACT inhaler Inhale 1-2 puffs into the lungs every 4 (four) hours as needed. For shortness for breath 08/20/21   Tanda Rockers, MD  BLACK ELDERBERRY PO Take 1 tablet by mouth daily.    [provider]  Cetirizine HCl 10 MG CAPS Take 10 mg by mouth as needed. 04/15/18   [provider]  cholecalciferol (VITAMIN D) 25 MCG (1000 UT) tablet Take 1,000 Units by mouth daily. 02/21/18   [provider]  diclofenac Sodium (VOLTAREN) 1 % GEL Apply 2 g topically 4 (four) times daily. 10/19/21   Sharion Balloon, FNP  doxycycline (VIBRA-TABS) 100 MG tablet Take 1 tablet (100 mg total) by mouth 2 (two) times daily. 01/14/22   Ivy Lynn, NP  EPINEPHrine 0.3 mg/0.3 mL IJ SOAJ injection as needed.    [provider]  hydrOXYzine (VISTARIL) 25 MG capsule Take 1 capsule (25 mg total) by mouth every 8 (eight) hours as needed. 02/24/21   Baruch Gouty, FNP  insulin glargine (LANTUS SOLOSTAR) 100 UNIT/ML Solostar Pen Inject 66 Units into the skin daily. 10/29/21   Shamleffer, Melanie Crazier, MD  Insulin Pen Needle 32G  X 4 MM MISC 1 Device by Does not apply route daily in the afternoon. 10/29/21   Shamleffer, Melanie Crazier, MD  levalbuterol Penne Lash) 1.25 MG/3ML nebulizer solution Take 1.25 mg by nebulization as needed for wheezing.    [provider]  metFORMIN (GLUCOPHAGE) 500 MG tablet Take 2 tablets (1,000 mg total) by mouth 2 (two) times daily. 10/29/21   Shamleffer, Melanie Crazier, MD  Mometasone [facility] [facility] HFA) 200 MCG/ACT AERO Inhale 1 puff into the lungs in the morning and at bedtime. 08/20/21   Tanda Rockers, MD  NON FORMULARY Plexus Probio 5 dietary  supplement    [provider]  NON FORMULARY Plexus Slim Hunger Control weight management series    [provider]  RABEprazole (ACIPHEX) 20 MG tablet TAKE 1 TABLET BY MOUTH TWICE A DAY 11/29/21   Nandigam, Venia Minks, MD  rosuvastatin (CRESTOR) 5 MG tablet Take 1 tablet (5 mg total) by mouth daily. 10/21/21   Sharion Balloon, FNP  Spacer/Aero-Holding Chambers (AEROCHAMBER PLUS WITH MASK) inhaler Use as directed 08/25/20   Tanda Rockers, MD  vitamin B-12 (CYANOCOBALAMIN) 50 MCG tablet Take 50 mcg by mouth daily.    [provider]      Allergies    Fluticasone-salmeterol, Morphine, Peanut oil, Peanut-containing drug products, Sulfa antibiotics, Other, Penicillins, Sulfamethoxazole, Ciprofloxacin, Clindamycin, Cyclobenzaprine, Loracarbef, Metronidazole, Moxifloxacin hcl in nacl, and Penicillin g sodium    Review of Systems   Review of Systems  Constitutional:  Negative for chills and fever.  HENT:  Negative for sore throat.   Eyes:  Negative for visual disturbance.  Respiratory:  Positive for shortness of breath. Negative for cough.   Cardiovascular:  Negative for chest pain.  Neurological:  Negative for headaches.  All other systems reviewed and are negative.   Physical Exam Updated Vital Signs BP 112/61   Pulse 80   Temp 98.2 F (36.8 C) (Oral)   Resp 12   Wt 64.9 kg   SpO2 99%   BMI 27.02 kg/m  Physical Exam Vitals and nursing note reviewed.  Constitutional:      General: She is not in acute distress.    Appearance: Normal appearance. She is not ill-appearing, toxic-appearing or diaphoretic.  HENT:     Head: Normocephalic and atraumatic.  Neurological:     Mental Status: She is alert.     ED Results / Procedures / Treatments   Labs (all labs ordered are listed, but only abnormal results are displayed) Labs Reviewed  BASIC METABOLIC PANEL - Abnormal; Notable for the following components:      Result Value   CO2 18 (*)    Glucose, Bld 182  (*)    BUN 26 (*)    All other components within normal limits  CBC WITH DIFFERENTIAL/PLATELET  TROPONIN I (HIGH SENSITIVITY)  TROPONIN I (HIGH SENSITIVITY)    EKG EKG Interpretation  Date/Time:  Sunday February 27 2022 06:18:30 EST Ventricular Rate:  105 PR Interval:  156 QRS Duration: 78 QT Interval:  364 QTC Calculation: 481 R Axis:   101 Text Interpretation: Sinus tachycardia Rightward axis Borderline ECG When compared with ECG of 29-Mar-2020 12:25, No significant change since last tracing Confirmed by Aletta Edouard [phone]) on 02/27/2022 8:12:50 AM  Radiology DG Chest 2 View  Result Date: 02/27/2022 CLINICAL DATA:  Short of breath, hypertension EXAM: CHEST - 2 VIEW COMPARISON:  Recent prior chest x-ray 02/16/2021 FINDINGS: The heart size and mediastinal contours are within normal limits. Both lungs are  clear. Stable mild bronchitic changes. The visualized skeletal structures are unremarkable. IMPRESSION: No active cardiopulmonary disease. Electronically Signed   By: Jacqulynn Cadet M.D.   On: 02/27/2022 07:44    Procedures Procedures   Medications Ordered in ED Medications - No data to display  ED Course/ Medical Decision Making/ A&P 1}                          Medical Decision Making  67 year old female presents to ED for evaluation.  Please see HPI for further details.  On examination patient afebrile and nontachycardic.  Patient lung sounds are clear bilaterally, she is not hypoxic.  Patient abdomen soft and compressible throughout.  Patient neurological examination shows no focal neurodeficits.  Patient resting comfortably in bed with reassuring vital signs.  Patient lab work initiated in triage include CBC, troponin, BMP, chest x-ray, EKG.  CBC unremarkable no leukocytosis or anemia.  The patient denies any chest pain, unsure what troponin was collected.  Patient troponin stable at 3.  Patient BMP unremarkable.  Patient EKG nonischemic.  Patient chest x-ray shows  no consolidations or effusions.  Patient vital signs reassuring with blood pressure of 112/61, oxygen saturation of 99, pulse rate of 80.  Patient denies any chest pain, shortness of breath after removal of mask, headache or blurred vision.  Patient has low Wells score, I doubt PE.  At this time, patient workup reassuring.  Patient will be discharged home and advised to follow-up with PCP.  The patient we discharged home with a form to record her blood pressure.  The patient has been advised to trend her blood pressures and measure them 3 times daily.  The patient was advised to return to the ED with any new or worsening symptoms or development of chest pain, headache, blurred vision.  Patient encouraged to return with new or returning shortness of breath.  Patient had all questions answered to her satisfaction.  Patient voiced understanding of return precautions.  The patient is stable for discharge.  Final Clinical Impression(s) / ED Diagnoses Final diagnoses:  Hypertension, unspecified type    Rx / DC Orders ED Discharge Orders     None         Lawana Chambers 02/27/22 0926    Hayden Rasmussen, MD 02/27/22 1733

## 2022-02-28 ENCOUNTER — Encounter: Payer: Self-pay | Admitting: Nurse Practitioner

## 2022-02-28 ENCOUNTER — Ambulatory Visit: Payer: 59 | Admitting: Nurse Practitioner

## 2022-02-28 VITALS — BP 123/63 | HR 76 | Temp 97.2°F | Resp 20 | Ht 61.0 in | Wt 139.0 lb

## 2022-02-28 DIAGNOSIS — I159 Secondary hypertension, unspecified: Secondary | ICD-10-CM

## 2022-02-28 DIAGNOSIS — E1159 Type 2 diabetes mellitus with other circulatory complications: Secondary | ICD-10-CM

## 2022-02-28 DIAGNOSIS — I152 Hypertension secondary to endocrine disorders: Secondary | ICD-10-CM

## 2022-02-28 NOTE — Progress Notes (Signed)
Subjective:    Patient ID: Stephanie Carroll, female    DOB: 21-Feb-1955, 67 y.o.   MRN: [id number]   Chief Complaint: ED follow up  HPI Patient went to ED on 02/27/22. Patient had woken up early in morning sweating and she checked her  blood pressure and it was 419 systolic. That made her very nervous and anxious. She called EMS. By  the time she arrived at the hospital her blood pressure was 112/61. All tests were negative and she was discharged home and told to follow  up with her PCP.    Review of Systems  Constitutional:  Negative for diaphoresis.  Eyes:  Negative for pain.  Respiratory:  Negative for shortness of breath.   Cardiovascular:  Negative for chest pain, palpitations and leg swelling.  Gastrointestinal:  Negative for abdominal pain.  Endocrine: Negative for polydipsia.  Skin:  Negative for rash.  Neurological:  Negative for dizziness, weakness and headaches.  Hematological:  Does not bruise/bleed easily.  All other systems reviewed and are negative.      Objective:   Physical Exam Vitals and nursing note reviewed.  Constitutional:      General: She is not in acute distress.    Appearance: Normal appearance. She is well-developed.  Neck:     Vascular: No carotid bruit or JVD.  Cardiovascular:     Rate and Rhythm: Normal rate and regular rhythm.     Heart sounds: Normal heart sounds.  Pulmonary:     Effort: Pulmonary effort is normal. No respiratory distress.     Breath sounds: Normal breath sounds. No wheezing or rales.  Chest:     Chest wall: No tenderness.  Abdominal:     General: Bowel sounds are normal. There is no distension or abdominal bruit.     Palpations: Abdomen is soft. There is no hepatomegaly, splenomegaly, mass or pulsatile mass.     Tenderness: There is no abdominal tenderness.  Musculoskeletal:        General: Normal range of motion.     Cervical back: Normal range of motion and neck supple.  Lymphadenopathy:     Cervical: No cervical  adenopathy.  Skin:    General: Skin is warm and dry.  Neurological:     Mental Status: She is alert and oriented to person, place, and time.     Deep Tendon Reflexes: Reflexes are normal and symmetric.  Psychiatric:        Behavior: Behavior normal.        Thought Content: Thought content normal.        Judgment: Judgment normal.     BP 123/63   Pulse 76   Temp (!) 97.2 F (36.2 C) (Temporal)   Resp 20   Ht '5\' 1"'$  (1.549 m)   Wt 139 lb (63 kg)   SpO2 97%   BMI 26.26 kg/m        Assessment & Plan:  Stephanie Carroll in today with chief complaint of Hospitalization Follow-up   1. Secondary hypertension Was just a o ne time episode Probably had panic attack Keep diary of blood sugars at home Keep follow up with PCP Dash die encouraged.  Hospital records reviewed  The above assessment and management plan was discussed with the patient. The patient verbalized understanding of and has agreed to the management plan. Patient is aware to call the clinic if symptoms persist or worsen. Patient is aware when to return to the clinic for a follow-up visit. Patient  educated on when it is appropriate to go to the emergency department.   Mary-Margaret Hassell Done, FNP

## 2022-02-28 NOTE — Patient Instructions (Signed)

## 2022-03-02 ENCOUNTER — Telehealth: Payer: Self-pay | Admitting: Family

## 2022-03-02 ENCOUNTER — Other Ambulatory Visit: Payer: Self-pay | Admitting: Family

## 2022-03-02 DIAGNOSIS — M19041 Primary osteoarthritis, right hand: Secondary | ICD-10-CM

## 2022-03-02 NOTE — Telephone Encounter (Signed)
Pt has a visit recently with [facility] for ER Follow Up for high BP. Pt was advised to keep a check of her BP at home. Pt says yesterday when she woke up her BP was 159/79 and this morning when she woke up her BP was 158/80. Wants to know if PCP thinks she should start taking BP medicine and if so, pt would like to start taking LOSARTAN if possible.

## 2022-03-03 MED ORDER — LOSARTAN POTASSIUM 50 MG PO TABS: 50.0000 mg | ORAL_TABLET | Freq: Every day | ORAL | 0 refills | Status: DC

## 2022-03-03 NOTE — Telephone Encounter (Signed)
Losartan 50 mg Prescription sent to pharmacy, needs follow up with me in 2 weeks

## 2022-03-03 NOTE — Telephone Encounter (Signed)
Pt aware and has a scheduled apt 03/25/2022.

## 2022-03-04 ENCOUNTER — Telehealth: Payer: Self-pay

## 2022-03-04 NOTE — Telephone Encounter (Signed)
Patient reports she took Losartan 50 mg last night about 8 pm.  She woke up about 2 am sweating and checked blood pressure, it was 158/73.  This morning she checked again and it has been 121/52, 111/48, and 122/62.  She said she is experiencing dizziness.  She's not sure if this may be coming from adjusting to the medication and wanted your opinion.

## 2022-03-04 NOTE — Telephone Encounter (Signed)
Please have her an appointment in person with me on Monday. She can hold medication if she is having dizziness.

## 2022-03-04 NOTE — Telephone Encounter (Signed)
Patient aware and appointment scheduled.  

## 2022-03-07 ENCOUNTER — Encounter: Payer: Self-pay | Admitting: Family

## 2022-03-07 ENCOUNTER — Ambulatory Visit: Payer: 59 | Admitting: Family

## 2022-03-07 VITALS — BP 150/78 | HR 113 | Temp 98.1°F | Ht 61.0 in | Wt 141.0 lb

## 2022-03-07 DIAGNOSIS — E1165 Type 2 diabetes mellitus with hyperglycemia: Secondary | ICD-10-CM

## 2022-03-07 DIAGNOSIS — Z09 Encounter for follow-up examination after completed treatment for conditions other than malignant neoplasm: Secondary | ICD-10-CM

## 2022-03-07 DIAGNOSIS — I152 Hypertension secondary to endocrine disorders: Secondary | ICD-10-CM | POA: Diagnosis not present

## 2022-03-07 DIAGNOSIS — E1159 Type 2 diabetes mellitus with other circulatory complications: Secondary | ICD-10-CM | POA: Diagnosis not present

## 2022-03-07 DIAGNOSIS — Z794 Long term (current) use of insulin: Secondary | ICD-10-CM

## 2022-03-07 MED ORDER — HYDROCHLOROTHIAZIDE 12.5 MG PO TABS: 12.5000 mg | ORAL_TABLET | Freq: Every day | ORAL | 3 refills | Status: DC

## 2022-03-07 NOTE — Progress Notes (Signed)
Subjective:    Patient ID: Stephanie Carroll, female    DOB: [birth year], 67 y.o.   MRN: [id number]  Chief Complaint  Patient presents with   Hypertension   Pt presents to the office today to follow up on hypertension. She went to the ED on 02/27/22 with a BP of 210/110, but went down to 112/61. Today her BP is elevated.   We started her on Losartan 50 mg, but states her BP was too low of 130's/60's. Hypertension This is a chronic problem. The current episode started more than 1 year ago. The problem has been waxing and waning since onset. The problem is uncontrolled. Pertinent negatives include no blurred vision, headaches, malaise/fatigue or peripheral edema. Risk factors for coronary artery disease include dyslipidemia, obesity, diabetes mellitus and sedentary lifestyle. Past treatments include angiotensin blockers. The current treatment provides mild improvement.  Diabetes She presents for her follow-up diabetic visit. She has type 2 diabetes mellitus. Pertinent negatives for hypoglycemia include no headaches. Pertinent negatives for diabetes include no blurred vision and no foot paresthesias.      Review of Systems  Constitutional:  Negative for malaise/fatigue.  Eyes:  Negative for blurred vision.  Neurological:  Negative for headaches.  All other systems reviewed and are negative.      Objective:   Physical Exam Vitals reviewed.  Constitutional:      General: She is not in acute distress.    Appearance: She is well-developed.  HENT:     Head: Normocephalic and atraumatic.     Right Ear: Tympanic membrane normal.     Left Ear: Tympanic membrane normal.  Eyes:     Pupils: Pupils are equal, round, and reactive to light.  Neck:     Thyroid: No thyromegaly.  Cardiovascular:     Rate and Rhythm: Normal rate and regular rhythm.     Heart sounds: Normal heart sounds. No murmur heard. Pulmonary:     Effort: Pulmonary effort is normal. No respiratory distress.     Breath  sounds: Normal breath sounds. No wheezing.  Abdominal:     General: Bowel sounds are normal. There is no distension.     Palpations: Abdomen is soft.     Tenderness: There is no abdominal tenderness.  Musculoskeletal:        General: No tenderness. Normal range of motion.     Cervical back: Normal range of motion and neck supple.  Skin:    General: Skin is warm and dry.  Neurological:     Mental Status: She is alert and oriented to person, place, and time.     Cranial Nerves: No cranial nerve deficit.     Deep Tendon Reflexes: Reflexes are normal and symmetric.  Psychiatric:        Behavior: Behavior normal.        Thought Content: Thought content normal.        Judgment: Judgment normal.       BP (!) 150/78   Pulse (!) 113   Temp 98.1 F (36.7 C) (Temporal)   Ht '5\' 1"'$  (1.549 m)   Wt 141 lb (64 kg)   SpO2 100%   BMI 26.64 kg/m      Assessment & Plan:   Maudene Stotler Beaubrun comes in today with chief complaint of Hypertension   Diagnosis and orders addressed:  1. Hypertension associated with type 2 diabetes mellitus (HCC) Start HCTZ 12.5 mg  -Daily blood pressure log given with instructions on how to fill out  and told to bring to next visit -Dash diet information given -Exercise encouraged - Stress Management  -Continue current meds -RTO in 2 weeks  - hydrochlorothiazide (HYDRODIURIL) 12.5 MG tablet; Take 1 tablet (12.5 mg total) by mouth daily.  Dispense: 90 tablet; Refill: 3  2. Type 2 diabetes mellitus with hyperglycemia, with long-term current use of insulin (HCC) Continue medications   3. Hospital discharge follow-up Notes reviewed    Labs reviewed  Health Maintenance reviewed Diet and exercise encouraged  Follow up plan: 2 weeks    Evelina Dun, FNP

## 2022-03-07 NOTE — Patient Instructions (Signed)
Hypertension, Adult High blood pressure (hypertension) is when the force of blood pumping through the arteries is too strong. The arteries are the blood vessels that carry blood from the heart throughout the body. Hypertension forces the heart to work harder to pump blood and may cause arteries to become narrow or stiff. Untreated or uncontrolled hypertension can lead to a heart attack, heart failure, a stroke, kidney disease, and other problems. A blood pressure reading consists of a higher number over a lower number. Ideally, your blood pressure should be below 120/80. The first ("top") number is called the systolic pressure. It is a measure of the pressure in your arteries as your heart beats. The second ("bottom") number is called the diastolic pressure. It is a measure of the pressure in your arteries as the heart relaxes. What are the causes? The exact cause of this condition is not known. There are some conditions that result in high blood pressure. What increases the risk? Certain factors may make you more likely to develop high blood pressure. Some of these risk factors are under your control, including: Smoking. Not getting enough exercise or physical activity. Being overweight. Having too much fat, sugar, calories, or salt (sodium) in your diet. Drinking too much alcohol. Other risk factors include: Having a personal history of heart disease, diabetes, high cholesterol, or kidney disease. Stress. Having a family history of high blood pressure and high cholesterol. Having obstructive sleep apnea. Age. The risk increases with age. What are the signs or symptoms? High blood pressure may not cause symptoms. Very high blood pressure (hypertensive crisis) may cause: Headache. Fast or irregular heartbeats (palpitations). Shortness of breath. Nosebleed. Nausea and vomiting. Vision changes. Severe chest pain, dizziness, and seizures. How is this diagnosed? This condition is diagnosed by  measuring your blood pressure while you are seated, with your arm resting on a flat surface, your legs uncrossed, and your feet flat on the floor. The cuff of the blood pressure monitor will be placed directly against the skin of your upper arm at the level of your heart. Blood pressure should be measured at least twice using the same arm. Certain conditions can cause a difference in blood pressure between your right and left arms. If you have a high blood pressure reading during one visit or you have normal blood pressure with other risk factors, you may be asked to: Return on a different day to have your blood pressure checked again. Monitor your blood pressure at home for 1 week or longer. If you are diagnosed with hypertension, you may have other blood or imaging tests to help your health care provider understand your overall risk for other conditions. How is this treated? This condition is treated by making healthy lifestyle changes, such as eating healthy foods, exercising more, and reducing your alcohol intake. You may be referred for counseling on a healthy diet and physical activity. Your health care provider may prescribe medicine if lifestyle changes are not enough to get your blood pressure under control and if: Your systolic blood pressure is above 130. Your diastolic blood pressure is above 80. Your personal target blood pressure may vary depending on your medical conditions, your age, and other factors. Follow these instructions at home: Eating and drinking  Eat a diet that is high in fiber and potassium, and low in sodium, added sugar, and fat. An example of this eating plan is called the DASH diet. DASH stands for Dietary Approaches to Stop Hypertension. To eat this way: Eat   plenty of fresh fruits and vegetables. Try to fill one half of your plate at each meal with fruits and vegetables. Eat whole grains, such as whole-wheat pasta, brown rice, or whole-grain bread. Fill about one  fourth of your plate with whole grains. Eat or drink low-fat dairy products, such as skim milk or low-fat yogurt. Avoid fatty cuts of meat, processed or cured meats, and poultry with skin. Fill about one fourth of your plate with lean proteins, such as fish, chicken without skin, beans, eggs, or tofu. Avoid pre-made and processed foods. These tend to be higher in sodium, added sugar, and fat. Reduce your daily sodium intake. Many people with hypertension should eat less than 1,500 mg of sodium a day. Do not drink alcohol if: Your health care provider tells you not to drink. You are pregnant, may be pregnant, or are planning to become pregnant. If you drink alcohol: Limit how much you have to: 0-1 drink a day for women. 0-2 drinks a day for men. Know how much alcohol is in your drink. In the [place] one drink equals one 12 oz bottle of beer (355 mL), one 5 oz glass of wine (148 mL), or one 1 oz glass of hard liquor (44 mL). Lifestyle  Work with your health care provider to maintain a healthy body weight or to lose weight. Ask what an ideal weight is for you. Get at least 30 minutes of exercise that causes your heart to beat faster (aerobic exercise) most days of the week. Activities may include walking, swimming, or biking. Include exercise to strengthen your muscles (resistance exercise), such as Pilates or lifting weights, as part of your weekly exercise routine. Try to do these types of exercises for 30 minutes at least 3 days a week. Do not use any products that contain nicotine or tobacco. These products include cigarettes, chewing tobacco, and vaping devices, such as e-cigarettes. If you need help quitting, ask your health care provider. Monitor your blood pressure at home as told by your health care provider. Keep all follow-up visits. This is important. Medicines Take over-the-counter and prescription medicines only as told by your health care provider. Follow directions carefully. Blood  pressure medicines must be taken as prescribed. Do not skip doses of blood pressure medicine. Doing this puts you at risk for problems and can make the medicine less effective. Ask your health care provider about side effects or reactions to medicines that you should watch for. Contact a health care provider if you: Think you are having a reaction to a medicine you are taking. Have headaches that keep coming back (recurring). Feel dizzy. Have swelling in your ankles. Have trouble with your vision. Get help right away if you: Develop a severe headache or confusion. Have unusual weakness or numbness. Feel faint. Have severe pain in your chest or abdomen. Vomit repeatedly. Have trouble breathing. These symptoms may be an emergency. Get help right away. Call 911. Do not wait to see if the symptoms will go away. Do not drive yourself to the hospital. Summary Hypertension is when the force of blood pumping through your arteries is too strong. If this condition is not controlled, it may put you at risk for serious complications. Your personal target blood pressure may vary depending on your medical conditions, your age, and other factors. For most people, a normal blood pressure is less than 120/80. Hypertension is treated with lifestyle changes, medicines, or a combination of both. Lifestyle changes include losing weight, eating a healthy,   low-sodium diet, exercising more, and limiting alcohol. This information is not intended to replace advice given to you by your health care provider. Make sure you discuss any questions you have with your health care provider. Document Revised: 12/08/2020 Document Reviewed: 12/08/2020 Elsevier Patient Education  2023 [employer]  

## 2022-03-19 ENCOUNTER — Emergency Department (HOSPITAL_COMMUNITY)
Admission: EM | Admit: 2022-03-19 | Discharge: 2022-03-19 | Disposition: A | Discharge status: Home / Self Care | Payer: 59 | Attending: Emergency Medicine | Admitting: Emergency Medicine

## 2022-03-19 ENCOUNTER — Other Ambulatory Visit: Payer: Self-pay

## 2022-03-19 ENCOUNTER — Encounter (HOSPITAL_COMMUNITY): Payer: Self-pay | Admitting: *Deleted

## 2022-03-19 DIAGNOSIS — Z91010 Allergy to peanuts: Secondary | ICD-10-CM | POA: Insufficient documentation

## 2022-03-19 DIAGNOSIS — I10 Essential (primary) hypertension: Secondary | ICD-10-CM | POA: Diagnosis not present

## 2022-03-19 DIAGNOSIS — E1165 Type 2 diabetes mellitus with hyperglycemia: Secondary | ICD-10-CM | POA: Insufficient documentation

## 2022-03-19 DIAGNOSIS — Z794 Long term (current) use of insulin: Secondary | ICD-10-CM | POA: Diagnosis not present

## 2022-03-19 DIAGNOSIS — R739 Hyperglycemia, unspecified: Secondary | ICD-10-CM

## 2022-03-19 DIAGNOSIS — R7401 Elevation of levels of liver transaminase levels: Secondary | ICD-10-CM | POA: Insufficient documentation

## 2022-03-19 DIAGNOSIS — Z7984 Long term (current) use of oral hypoglycemic drugs: Secondary | ICD-10-CM | POA: Diagnosis not present

## 2022-03-19 DIAGNOSIS — Z79899 Other long term (current) drug therapy: Secondary | ICD-10-CM | POA: Diagnosis not present

## 2022-03-19 LAB — CBC WITH DIFFERENTIAL/PLATELET
Abs Immature Granulocytes: 0.03 10*3/uL (ref 0.00–0.07)
Basophils Absolute: 0.1 10*3/uL (ref 0.0–0.1)
Basophils Relative: 1 %
Eosinophils Absolute: 0.4 10*3/uL (ref 0.0–0.5)
Eosinophils Relative: 4 %
HCT: 39 % (ref 36.0–46.0)
Hemoglobin: 13.2 g/dL (ref 12.0–15.0)
Immature Granulocytes: 0 %
Lymphocytes Relative: 22 %
Lymphs Abs: 2 10*3/uL (ref 0.7–4.0)
MCH: 30.1 pg (ref 26.0–34.0)
MCHC: 33.8 g/dL (ref 30.0–36.0)
MCV: 89 fL (ref 80.0–100.0)
Monocytes Absolute: 0.6 10*3/uL (ref 0.1–1.0)
Monocytes Relative: 6 %
Neutro Abs: 6.1 10*3/uL (ref 1.7–7.7)
Neutrophils Relative %: 67 %
Platelets: 202 10*3/uL (ref 150–400)
RBC: 4.38 MIL/uL (ref 3.87–5.11)
RDW: 12.8 % (ref 11.5–15.5)
WBC: 9.1 10*3/uL (ref 4.0–10.5)
nRBC: 0 % (ref 0.0–0.2)

## 2022-03-19 LAB — COMPREHENSIVE METABOLIC PANEL
ALT: 66 U/L — ABNORMAL HIGH (ref 0–44)
AST: 56 U/L — ABNORMAL HIGH (ref 15–41)
Albumin: 4.3 g/dL (ref 3.5–5.0)
Alkaline Phosphatase: 69 U/L (ref 38–126)
Anion gap: 14 (ref 5–15)
BUN: 12 mg/dL (ref 8–23)
CO2: 22 mmol/L (ref 22–32)
Calcium: 9.2 mg/dL (ref 8.9–10.3)
Chloride: 102 mmol/L (ref 98–111)
Creatinine, Ser: 0.69 mg/dL (ref 0.44–1.00)
GFR, Estimated: >60 mL/min (ref >60)
Glucose, Bld: 135 mg/dL — ABNORMAL HIGH (ref 70–99)
Potassium: 3.5 mmol/L (ref 3.5–5.1)
Sodium: 138 mmol/L (ref 135–145)
Total Bilirubin: 0.6 mg/dL (ref 0.3–1.2)
Total Protein: 7.7 g/dL (ref 6.5–8.1)

## 2022-03-19 LAB — URINALYSIS, ROUTINE W REFLEX MICROSCOPIC
Bilirubin Urine: NEGATIVE
Glucose, UA: NEGATIVE mg/dL
Hgb urine dipstick: NEGATIVE
Ketones, ur: NEGATIVE mg/dL
Leukocytes,Ua: NEGATIVE
Nitrite: NEGATIVE
Protein, ur: NEGATIVE mg/dL
Specific Gravity, Urine: 1.003 — ABNORMAL LOW (ref 1.005–1.030)
pH: 7 (ref 5.0–8.0)

## 2022-03-19 LAB — CBG MONITORING, ED: Glucose-Capillary: 133 mg/dL — ABNORMAL HIGH (ref 70–99)

## 2022-03-19 NOTE — ED Provider Notes (Signed)
[facility] Provider Note   CSN: [id number] Arrival date & time: 03/19/22  1019     History  Chief Complaint  Patient presents with   Hyperglycemia    Stephanie Carroll is a 67 y.o. female.  She is here for elevated blood sugar.  She is diabetic check her sugars daily.  She checked today twice and got readings of high and over 500.  She normally is around 100-130.  She was sick a few weeks ago with respiratory symptoms got put on steroids which caused her blood pressure to go up.  PCP has been adjusting her blood pressure medications and started her on hydrochlorothiazide.  She is starting to feel better from a respiratory standpoint and is currently off steroids.  Her only complaint is a little bit of right upper quadrant pain.  No nausea or vomiting.  She took her metformin today.  The history is provided by the patient.  Hyperglycemia Blood sugar level PTA:  500 Severity:  Severe Duration:  1 day Progression:  Unchanged Chronicity:  New Diabetes status:  Controlled with oral medications Context: recent illness   Associated symptoms: abdominal pain   Associated symptoms: no chest pain, no dysuria, no fever, no nausea, no shortness of breath and no vomiting   Risk factors: recent steroid use        Home Medications Prior to Admission medications   Medication Sig Start Date End Date Taking? Authorizing Provider  albuterol (VENTOLIN HFA) 108 (90 Base) MCG/ACT inhaler Inhale 1-2 puffs into the lungs every 4 (four) hours as needed. For shortness for breath 08/20/21   Tanda Rockers, MD  BLACK ELDERBERRY PO Take 1 tablet by mouth daily.    [provider]  Cetirizine HCl 10 MG CAPS Take 10 mg by mouth as needed. 04/15/18   [provider]  cholecalciferol (VITAMIN D) 25 MCG (1000 UT) tablet Take 1,000 Units by mouth daily. 02/21/18   [provider]  diclofenac Sodium (VOLTAREN) 1 % GEL APPLY 2 GRAMS TO AFFECTED AREA 4  TIMES A DAY 03/03/22   Hawks, Christy A, FNP  EPINEPHrine 0.3 mg/0.3 mL IJ SOAJ injection as needed.    [provider]  hydrochlorothiazide (HYDRODIURIL) 12.5 MG tablet Take 1 tablet (12.5 mg total) by mouth daily. 03/07/22   Sharion Balloon, FNP  hydrOXYzine (VISTARIL) 25 MG capsule Take 1 capsule (25 mg total) by mouth every 8 (eight) hours as needed. 02/24/21   Baruch Gouty, FNP  insulin glargine (LANTUS SOLOSTAR) 100 UNIT/ML Solostar Pen Inject 66 Units into the skin daily. 10/29/21   Shamleffer, Melanie Crazier, MD  Insulin Pen Needle 32G X 4 MM MISC 1 Device by Does not apply route daily in the afternoon. 10/29/21   Shamleffer, Melanie Crazier, MD  levalbuterol Penne Lash) 1.25 MG/3ML nebulizer solution Take 1.25 mg by nebulization as needed for wheezing.    [provider]  metFORMIN (GLUCOPHAGE) 500 MG tablet Take 2 tablets (1,000 mg total) by mouth 2 (two) times daily. 10/29/21   Shamleffer, Melanie Crazier, MD  Mometasone Furoate [facility] HFA) 200 MCG/ACT AERO Inhale 1 puff into the lungs in the morning and at bedtime. 08/20/21   Tanda Rockers, MD  NON FORMULARY Plexus Probio 5 dietary supplement    [provider]  NON FORMULARY Plexus Slim Hunger Control weight management series    [provider]  RABEprazole (ACIPHEX) 20 MG tablet TAKE 1 TABLET BY MOUTH TWICE A  DAY 11/29/21   Mauri Pole, MD  rosuvastatin (CRESTOR) 5 MG tablet Take 1 tablet (5 mg total) by mouth daily. 10/21/21   Sharion Balloon, FNP  Semaglutide,0.25 or 0.'5MG'$ /DOS, 2 MG/3ML SOPN Inject 0.25 mg into the skin once a week. 02/22/22   Sharion Balloon, FNP  Spacer/Aero-Holding Chambers (AEROCHAMBER PLUS WITH MASK) inhaler Use as directed 08/25/20   Tanda Rockers, MD  vitamin B-12 (CYANOCOBALAMIN) 50 MCG tablet Take 50 mcg by mouth daily.    [provider]      Allergies    Fluticasone-salmeterol, Morphine, Peanut oil, Peanut-containing drug products, Sulfa antibiotics,  Other, Penicillins, Sulfamethoxazole, Sulfur, Montelukast, Ciprofloxacin, Clindamycin, Cyclobenzaprine, Loracarbef, Metronidazole, Moxifloxacin hcl in nacl, and Penicillin g sodium    Review of Systems   Review of Systems  Constitutional:  Negative for fever.  Respiratory:  Negative for shortness of breath.   Cardiovascular:  Negative for chest pain.  Gastrointestinal:  Positive for abdominal pain. Negative for nausea and vomiting.  Genitourinary:  Negative for dysuria.    Physical Exam Updated Vital Signs BP (!) 179/81   Pulse (!) 114   Temp 97.7 F (36.5 C) (Oral)   Resp 18   Ht '5\' 1"'$  (1.549 m)   Wt 63.5 kg   SpO2 100%   BMI 26.45 kg/m  Physical Exam Vitals and nursing note reviewed.  Constitutional:      General: She is not in acute distress.    Appearance: Normal appearance. She is well-developed.  HENT:     Head: Normocephalic and atraumatic.  Eyes:     Conjunctiva/sclera: Conjunctivae normal.  Cardiovascular:     Rate and Rhythm: Regular rhythm. Tachycardia present.     Heart sounds: No murmur heard. Pulmonary:     Effort: Pulmonary effort is normal. No respiratory distress.     Breath sounds: Normal breath sounds.  Abdominal:     Palpations: Abdomen is soft.     Tenderness: There is no abdominal tenderness. There is no guarding or rebound.  Musculoskeletal:        General: No deformity. Normal range of motion.     Cervical back: Neck supple.  Skin:    General: Skin is warm and dry.     Capillary Refill: Capillary refill takes less than 2 seconds.  Neurological:     General: No focal deficit present.     Mental Status: She is alert.    ED Results / Procedures / Treatments   Labs (all labs ordered are listed, but only abnormal results are displayed) Labs Reviewed  URINALYSIS, ROUTINE W REFLEX MICROSCOPIC - Abnormal; Notable for the following components:      Result Value   Color, Urine COLORLESS (*)    Specific Gravity, Urine 1.003 (*)    All other  components within normal limits  COMPREHENSIVE METABOLIC PANEL - Abnormal; Notable for the following components:   Glucose, Bld 135 (*)    AST 56 (*)    ALT 66 (*)    All other components within normal limits  CBG MONITORING, ED - Abnormal; Notable for the following components:   Glucose-Capillary 133 (*)    All other components within normal limits  CBC WITH DIFFERENTIAL/PLATELET    EKG None  Radiology No results found.  Procedures Procedures    Medications Ordered in ED Medications - No data to display  ED Course/ Medical Decision Making/ A&P  Medical Decision Making Amount and/or Complexity of Data Reviewed Labs: ordered.   This patient complains of elevated blood sugar; this involves an extensive number of treatment Options and is a complaint that carries with it a high risk of complications and morbidity. The differential includes hyperglycemia, DKA, lab error, dehydration  I ordered, reviewed and interpreted labs, which included CBC normal chemistries normal other than mildly elevated glucose, AST and ALT elevated similar to priors, urinalysis without glucosuria  Additional history obtained from patient's husband Previous records obtained and reviewed in epic no recent admissions Social determinants considered, no significant barriers Critical Interventions: None  After the interventions stated above, I reevaluated the patient and found patient to be asymptomatic Admission and further testing considered, no indications for admission or further workup at this time.  Recommended patient try a different glucose monitoring device and follow-up with PCP.         Final Clinical Impression(s) / ED Diagnoses Final diagnoses:  Hyperglycemia    Rx / DC Orders ED Discharge Orders     None         Hayden Rasmussen, MD 03/19/22 [phone]

## 2022-03-19 NOTE — Discharge Instructions (Addendum)
You are seen in the emergency department for evaluation of elevated blood sugar at home.  Your blood sugar was repeated here and was 135.  Please check your machine and follow-up with your primary care doctor.  Continue your regular medications.  Return to the emergency department if any worsening or concerning symptoms

## 2022-03-19 NOTE — ED Triage Notes (Signed)
Pt recently started new medication for HTN, checked her sugar this morning and was 598. Currently on Metformin.

## 2022-03-21 ENCOUNTER — Ambulatory Visit: Payer: 59 | Admitting: Hematology & Oncology

## 2022-03-21 ENCOUNTER — Inpatient Hospital Stay: Payer: 59

## 2022-03-25 ENCOUNTER — Encounter: Payer: Self-pay | Admitting: Family

## 2022-03-25 ENCOUNTER — Ambulatory Visit (INDEPENDENT_AMBULATORY_CARE_PROVIDER_SITE_OTHER): Payer: 59 | Admitting: Family

## 2022-03-25 VITALS — BP 134/73 | HR 101 | Temp 96.2°F | Ht 61.0 in | Wt 142.0 lb

## 2022-03-25 DIAGNOSIS — Z09 Encounter for follow-up examination after completed treatment for conditions other than malignant neoplasm: Secondary | ICD-10-CM | POA: Diagnosis not present

## 2022-03-25 DIAGNOSIS — E1159 Type 2 diabetes mellitus with other circulatory complications: Secondary | ICD-10-CM

## 2022-03-25 DIAGNOSIS — Z794 Long term (current) use of insulin: Secondary | ICD-10-CM

## 2022-03-25 DIAGNOSIS — I152 Hypertension secondary to endocrine disorders: Secondary | ICD-10-CM

## 2022-03-25 DIAGNOSIS — E113299 Type 2 diabetes mellitus with mild nonproliferative diabetic retinopathy without macular edema, unspecified eye: Secondary | ICD-10-CM

## 2022-03-25 NOTE — Progress Notes (Signed)
Subjective:    Patient ID: Stephanie Carroll, female    DOB: [birth year], 67 y.o.   MRN: [id number]  Chief Complaint  Patient presents with   Diabetes   PT presents to the office today to recheck HTN after starting HTCZ 12.5 mg. Her BP is at goal today.   She was also found to have an A1C of 9.3. We started her on Ozempic, but states she did not start this because she was worried about her stomach issues.   She went to the ED on 03/19/22 with hyperglycemia, but was found that it was from faulty test strips. Her glucose was stable in ED of 135. Diabetes She presents for her follow-up diabetic visit. She has type 2 diabetes mellitus. Associated symptoms include blurred vision. Pertinent negatives for diabetes include no foot paresthesias. Symptoms are stable. Risk factors for coronary artery disease include dyslipidemia, diabetes mellitus, hypertension and sedentary lifestyle. She is following a generally unhealthy diet. Her overall blood glucose range is 140-180 mg/dl.  Hypertension This is a chronic problem. The current episode started more than 1 year ago. The problem has been resolved since onset. The problem is controlled. Associated symptoms include blurred vision, malaise/fatigue and shortness of breath. Pertinent negatives include no peripheral edema. Risk factors for coronary artery disease include dyslipidemia, diabetes mellitus, female gender and sedentary lifestyle. The current treatment provides moderate improvement. There is no history of heart failure.      Review of Systems  Constitutional:  Positive for malaise/fatigue.  Eyes:  Positive for blurred vision.  Respiratory:  Positive for shortness of breath.   All other systems reviewed and are negative.      Objective:   Physical Exam Vitals reviewed.  Constitutional:      General: She is not in acute distress.    Appearance: She is well-developed.  HENT:     Head: Normocephalic and atraumatic.     Right Ear: Tympanic  membrane normal.     Left Ear: Tympanic membrane normal.  Eyes:     Pupils: Pupils are equal, round, and reactive to light.  Neck:     Thyroid: No thyromegaly.  Cardiovascular:     Rate and Rhythm: Normal rate and regular rhythm.     Heart sounds: Normal heart sounds. No murmur heard. Pulmonary:     Effort: Pulmonary effort is normal. No respiratory distress.     Breath sounds: Normal breath sounds. No wheezing.  Abdominal:     General: Bowel sounds are normal. There is no distension.     Palpations: Abdomen is soft.     Tenderness: There is no abdominal tenderness.  Musculoskeletal:        General: No tenderness. Normal range of motion.     Cervical back: Normal range of motion and neck supple.  Skin:    General: Skin is warm and dry.  Neurological:     Mental Status: She is alert and oriented to person, place, and time.     Cranial Nerves: No cranial nerve deficit.     Deep Tendon Reflexes: Reflexes are normal and symmetric.  Psychiatric:        Behavior: Behavior normal.        Thought Content: Thought content normal.        Judgment: Judgment normal.     BP 134/73   Pulse (!) 101   Temp (!) 96.2 F (35.7 C) (Temporal)   Ht 5' 1"$  (1.549 m)   Wt 142 lb (64.4 kg)  BMI 26.83 kg/m      Assessment & Plan:  Stephanie Carroll comes in today with chief complaint of Diabetes   Diagnosis and orders addressed:  1. Hypertension associated with type 2 diabetes mellitus ([place]) - CMP14+EGFR  2. Type 2 diabetes mellitus with mild nonproliferative retinopathy without macular edema, with long-term current use of insulin, unspecified laterality ([facility]) - CMP14+EGFR  3. Hospital discharge follow-up   Labs pending Continue medications  Low carb diet  Health Maintenance reviewed Diet and exercise encouraged  Follow up plan: 1 month    Evelina Dun, FNP

## 2022-03-25 NOTE — Patient Instructions (Signed)

## 2022-03-26 LAB — CMP14+EGFR
ALT: 65 IU/L — ABNORMAL HIGH (ref 0–32)
AST: 50 IU/L — ABNORMAL HIGH (ref 0–40)
Albumin/Globulin Ratio: 1.9 (ref 1.2–2.2)
Albumin: 4.7 g/dL (ref 3.9–4.9)
Alkaline Phosphatase: 82 IU/L (ref 44–121)
BUN/Creatinine Ratio: 17 (ref 12–28)
BUN: 12 mg/dL (ref 8–27)
Bilirubin Total: 0.4 mg/dL (ref 0.0–1.2)
CO2: 20 mmol/L (ref 20–29)
Calcium: 10.1 mg/dL (ref 8.7–10.3)
Chloride: 97 mmol/L (ref 96–106)
Creatinine, Ser: 0.72 mg/dL (ref 0.57–1.00)
Globulin, Total: 2.5 g/dL (ref 1.5–4.5)
Glucose: 237 mg/dL — ABNORMAL HIGH (ref 70–99)
Potassium: 4.7 mmol/L (ref 3.5–5.2)
Sodium: 138 mmol/L (ref 134–144)
Total Protein: 7.2 g/dL (ref 6.0–8.5)
eGFR: 93 mL/min/{1.73_m2} (ref >59)

## 2022-03-28 ENCOUNTER — Telehealth: Payer: Self-pay | Admitting: Physician Assistant

## 2022-03-28 ENCOUNTER — Other Ambulatory Visit: Payer: Self-pay | Admitting: Family

## 2022-03-28 NOTE — Telephone Encounter (Signed)
Patient complains of sharp intermittent RUQ pain. She has started taking Pepcid as well for burping. No reflux. She is on Aciphex. A heating pad gives her some comfort. She does not have a cough. She had COVID in January and questions if it could have caused her these problems.  Appointment made for evaluation.

## 2022-03-28 NOTE — Telephone Encounter (Signed)
Inbound call from patient stating that she is having sharp pains again in her RUQ and is requesting a call back to discuss. Please advise.

## 2022-03-30 ENCOUNTER — Ambulatory Visit: Payer: 59 | Admitting: Physician Assistant

## 2022-03-30 ENCOUNTER — Encounter: Payer: Self-pay | Admitting: Physician Assistant

## 2022-03-30 VITALS — BP 126/78 | HR 100 | Ht 61.0 in | Wt 141.0 lb

## 2022-03-30 DIAGNOSIS — Z85038 Personal history of other malignant neoplasm of large intestine: Secondary | ICD-10-CM | POA: Diagnosis not present

## 2022-03-30 DIAGNOSIS — K219 Gastro-esophageal reflux disease without esophagitis: Secondary | ICD-10-CM | POA: Diagnosis not present

## 2022-03-30 DIAGNOSIS — K760 Fatty (change of) liver, not elsewhere classified: Secondary | ICD-10-CM

## 2022-03-30 DIAGNOSIS — R1011 Right upper quadrant pain: Secondary | ICD-10-CM

## 2022-03-30 MED ORDER — FAMOTIDINE 40 MG PO TABS: 40.0000 mg | ORAL_TABLET | Freq: Every day | ORAL | 0 refills | Status: DC

## 2022-03-30 MED ORDER — ONDANSETRON HCL 4 MG PO TABS: 4.0000 mg | ORAL_TABLET | Freq: Three times a day (TID) | ORAL | 0 refills | Status: DC | PRN

## 2022-03-30 NOTE — Progress Notes (Signed)
03/30/2022 [place] [id number] [birth year]  Referring provider: Sharion Balloon, FNP Primary GI doctor: Dr. Silverio Decamp  ASSESSMENT AND PLAN:   RUQ pain with GERD, history of fatty liver, history of H pylori eradicated 2021 No rash, on aciphex twice daily, worse since recent prednisone use for URI Add on pepcid at night for likely worsening gastritis Will repeat AB [place] Last EGD 11/2020 showed 20 mm mucosal papule (nodule) in the gastric antrum and was noted to have moderate gastritis, discussed repeating with patient, will try pepcid, AB [place] first and if not improving will suggest repeat EGD. Will discuss with Dr. Silverio Decamp if she feels this needs to be done sooner.  Does not sound colicky, can consider HIDA Continue heating pad, consider lidocaine patches.   Hx of colon cancer, stage I 11/2020 colonoscopy done at that same time for evaluation of iron deficiency anemia and was found to have 2 small polyps, 1 was hyperplastic and the other was lymphoid aggregate, also with few diverticuli and nonbleeding internal and external hemorrhoids. There was a patent end-to-side anastomosis. Due 5 years but patient is concerned that she normally goes every 3 years.  I think looking at previous colonoscopies, she is due 5 years, discussed that is only for screening, if she has any symptoms we would precede sooner. Will still double check with Dr. Silverio Decamp.  Patient Care Team: Sharion Balloon, FNP as PCP - General (Family Medicine) Katy Fitch, Darlina Guys, MD as Consulting Physician (Ophthalmology)  HISTORY OF PRESENT ILLNESS: 67 y.o. female with a past medical history of GERD, previously treated H pylori [id number], IBS, nonalcoholic fatty liver disease, personal history of colon cancer stage I 2008 status postresection, prior hernia repair with mesh, hypertension, adult onset diabetes mellitus, hyperlipidemia, IDA with iron replacement with oncology in the past and others listed below.  She presents for  evaluation of RUQ pain.  Last seen 08/09/21 with Amy PA-C  09/21/18 AB [place] with electrography Showed fibrosis score F2 with some F3, moderate risk of fibrosis.  03/19/19 colonoscopy Sigmoid diverticulosis. Internal hemorrhoids. 2 sessile polyps 3 to 11 mm in size, adenoma.  03/19/19 EGD Gastritis, H. pylori positive  11/2020 EGD  with finding of a 20 mm mucosal papule (nodule) in the gastric antrum and was noted to have moderate gastritis. Biopsy of the nodule showed an inflammatory gastric polyp/hyperplastic with ulceration, no H. pylori.  No further anemia so did not repeat EGD. 11/2020 colonoscopy done at that same time for evaluation of iron deficiency anemia and was found to have 2 small polyps, 1 was hyperplastic and the other was lymphoid aggregate, also with few diverticuli and nonbleeding internal and external hemorrhoids. There was a patent end-to-side anastomosis. Due 5 years but patient is concerned that she normally goes every 3 years.  08/2021 AB [place] No cholelithiasis or sonographic evidence for acute cholecystitis. Increased hepatic parenchymal echogenicity suggestive of steatosis.   She states when she started on BP meds in last 3-4 weeks, she feels it started her AB pain.  She has RUQ AB pain, 3-4 weeks, sore to touch, a few episodes to her back, associated with worsening indigestion. No associated nausea, vomiting.  Not worse with food, can just hurt at rest.  She was on losartan, stopped that and now on HCTZ, sugar is 60-70 points higher than normal, and her BM was low this AM. She has been having some dizziness and fast heart beats.  She had URI several weeks ago, had steroids. This  increased her sugars, went to ER 02/03. That visit her sugars were 133, AST 56, ALT 66, no elevated alk phos, no anemia.  She states pepto and heat has helped. Denies rash.  Has had darker stools with the pepto., otherwise stools are unchanged  No weight loss. Mom with history of cholecystectomy  She   reports that she has never smoked. She has never used smokeless tobacco. She reports that she does not drink alcohol and does not use drugs.  RELEVANT LABS AND IMAGING: CBC    Component Value Date/Time   WBC 9.1 03/19/2022 1103   RBC 4.38 03/19/2022 1103   HGB 13.2 03/19/2022 1103   HGB 13.6 10/19/2021 1151   HGB 12.7 04/22/2016 1456   HGB 14.7 06/12/2009 0841   HCT 39.0 03/19/2022 1103   HCT 41.2 10/19/2021 1151   HCT 38.6 04/22/2016 1456   HCT 42.3 06/12/2009 0841   PLT 202 03/19/2022 1103   PLT 220 10/19/2021 1151   MCV 89.0 03/19/2022 1103   MCV 90 10/19/2021 1151   MCV 83 04/22/2016 1456   MCV 90.0 06/12/2009 0841   MCH 30.1 03/19/2022 1103   MCHC 33.8 03/19/2022 1103   RDW 12.8 03/19/2022 1103   RDW 12.6 10/19/2021 1151   RDW 14.2 04/22/2016 1456   RDW 13.4 06/12/2009 0841   LYMPHSABS 2.0 03/19/2022 1103   LYMPHSABS 2.5 10/19/2021 1151   LYMPHSABS 2.7 04/22/2016 1456   LYMPHSABS 3.0 06/12/2009 0841   MONOABS 0.6 03/19/2022 1103   MONOABS 0.6 06/12/2009 0841   EOSABS 0.4 03/19/2022 1103   EOSABS 0.3 10/19/2021 1151   EOSABS 0.2 04/22/2016 1456   BASOSABS 0.1 03/19/2022 1103   BASOSABS 0.1 10/19/2021 1151   BASOSABS 0.1 04/22/2016 1456   BASOSABS 0.0 06/12/2009 0841   Recent Labs    07/02/21 1222 08/09/21 1056 10/19/21 1151 02/27/22 0625 03/19/22 1103  HGB 13.4 13.1 13.6 13.8 13.2     CMP     Component Value Date/Time   NA 138 03/25/2022 1017   NA 141 11/29/2013 1454   K 4.7 03/25/2022 1017   K 4.1 04/22/2016 1456   K 3.8 11/29/2013 1454   CL 97 03/25/2022 1017   CL 95 (L) 04/22/2016 1456   CL 98 11/29/2013 1454   CO2 20 03/25/2022 1017   CO2 21 04/22/2016 1456   CO2 25 11/29/2013 1454   GLUCOSE 237 (H) 03/25/2022 1017   GLUCOSE 135 (H) 03/19/2022 1115   GLUCOSE 224 (H) 11/29/2013 1454   BUN 12 03/25/2022 1017   BUN 12 11/29/2013 1454   CREATININE 0.72 03/25/2022 1017   CREATININE 0.76 03/23/2021 0958   CREATININE 0.55 (L) 04/22/2016 1456    CREATININE 0.8 11/29/2013 1454   CALCIUM 10.1 03/25/2022 1017   CALCIUM 9.5 04/22/2016 1456   CALCIUM 9.2 11/29/2013 1454   PROT 7.2 03/25/2022 1017   PROT 7.4 11/29/2013 1454   ALBUMIN 4.7 03/25/2022 1017   ALBUMIN 4.3 04/22/2016 1456   AST 50 (H) 03/25/2022 1017   AST 39 03/23/2021 0958   ALT 65 (H) 03/25/2022 1017   ALT 58 (H) 03/23/2021 0958   ALT 36 11/29/2013 1454   ALKPHOS 82 03/25/2022 1017   ALKPHOS 103 04/22/2016 1456   ALKPHOS 93 (H) 11/29/2013 1454   BILITOT 0.4 03/25/2022 1017   BILITOT 0.4 03/23/2021 0958   GFRNONAA >60 03/19/2022 1115   GFRNONAA >60 03/23/2021 0958   GFRAA >60 07/17/2019 1153      Latest Ref Rng &  Units 03/25/2022   10:17 AM 03/19/2022   11:15 AM 02/21/2022   12:35 PM  Hepatic Function  Total Protein 6.0 - 8.5 g/dL 7.2  7.7  7.5   Albumin 3.9 - 4.9 g/dL 4.7  4.3  4.7   AST 0 - 40 IU/L 50  56  34   ALT 0 - 32 IU/L 65  66  57   Alk Phosphatase 44 - 121 IU/L 82  69  94   Total Bilirubin 0.0 - 1.2 mg/dL 0.4  0.6  0.3       Current Medications:   Current Outpatient Medications (Endocrine & Metabolic):    insulin glargine (LANTUS SOLOSTAR) 100 UNIT/ML Solostar Pen, Inject 66 Units into the skin daily.   metFORMIN (GLUCOPHAGE) 500 MG tablet, Take 2 tablets (1,000 mg total) by mouth 2 (two) times daily.   Semaglutide,0.25 or 0.5MG/DOS, 2 MG/3ML SOPN, Inject 0.25 mg into the skin once a week. (Patient not taking: Reported on 03/30/2022)  Current Outpatient Medications (Cardiovascular):    EPINEPHrine 0.3 mg/0.3 mL IJ SOAJ injection, as needed.   hydrochlorothiazide (HYDRODIURIL) 12.5 MG tablet, Take 1 tablet (12.5 mg total) by mouth daily.   rosuvastatin (CRESTOR) 5 MG tablet, Take 1 tablet (5 mg total) by mouth daily. (Patient not taking: Reported on 03/30/2022)  Current Outpatient Medications (Respiratory):    albuterol (VENTOLIN HFA) 108 (90 Base) MCG/ACT inhaler, Inhale 1-2 puffs into the lungs every 4 (four) hours as needed. For shortness for  breath   Cetirizine HCl 10 MG CAPS, Take 10 mg by mouth as needed.   levalbuterol (XOPENEX) 1.25 MG/3ML nebulizer solution, Take 1.25 mg by nebulization as needed for wheezing.   Mometasone Furoate (ASMANEX HFA) 200 MCG/ACT AERO, Inhale 1 puff into the lungs in the morning and at bedtime.   Current Outpatient Medications (Hematological):    vitamin B-12 (CYANOCOBALAMIN) 50 MCG tablet, Take 50 mcg by mouth daily. (Patient not taking: Reported on 03/30/2022)  Current Outpatient Medications (Other):    BLACK ELDERBERRY PO, Take 1 tablet by mouth daily.   cholecalciferol (VITAMIN D) 25 MCG (1000 UT) tablet, Take 1,000 Units by mouth daily.   diclofenac Sodium (VOLTAREN) 1 % GEL, APPLY 2 GRAMS TO AFFECTED AREA 4 TIMES A DAY   famotidine (PEPCID) 40 MG tablet, Take 1 tablet (40 mg total) by mouth at bedtime.   Insulin Pen Needle 32G X 4 MM MISC, 1 Device by Does not apply route daily in the afternoon.   NON FORMULARY, Plexus Probio 5 dietary supplement   NON FORMULARY, Plexus Slim Hunger Control weight management series   ondansetron (ZOFRAN) 4 MG tablet, Take 1 tablet (4 mg total) by mouth every 8 (eight) hours as needed for nausea or vomiting.   RABEprazole (ACIPHEX) 20 MG tablet, TAKE 1 TABLET BY MOUTH TWICE A DAY   Spacer/Aero-Holding Chambers (AEROCHAMBER PLUS WITH MASK) inhaler, Use as directed   hydrOXYzine (VISTARIL) 25 MG capsule, Take 1 capsule (25 mg total) by mouth every 8 (eight) hours as needed. (Patient not taking: Reported on 03/30/2022)  Medical History:  Past Medical History:  Diagnosis Date   Allergy    Arthritis    DDD   Asthma    Colon cancer ([place]) 2008   Diabetes mellitus    Elevated liver enzymes    GERD (gastroesophageal reflux disease)    History of thyroiditis 12/18/2007   Formatting of this note might be different from the original. Thyroiditis   Hyperlipidemia    Iron deficiency  anemia due to chronic blood loss 09/17/2020   Thyroid tumor, benign    Allergies:   Allergies  Allergen Reactions   Fluticasone-Salmeterol Palpitations   Morphine Palpitations and Other (See Comments)    REACTION: heart racing   Peanut Oil Other (See Comments) and Cough    congested   Peanut-Containing Drug Products Cough   Sulfa Antibiotics Other (See Comments)    REACTION: takes away appetite, makes her feel nervous, jittery   Other Other (See Comments)    Oats and Green peppers---headaches   Penicillins Rash   Sulfamethoxazole Nausea Only   Sulfur Other (See Comments)    "out of her body"   Montelukast    Ciprofloxacin Anxiety   Clindamycin Rash   Cyclobenzaprine Other (See Comments)   Loracarbef Rash        Metronidazole Other (See Comments)   Moxifloxacin Hcl In Nacl Anxiety   Penicillin G Sodium Rash     Surgical History:  She  has a past surgical history that includes Colon surgery; Partial hysterectomy; Incisional hernia repair; Nasal septum surgery; Cesarean section; Wrist ganglion excision; Polypectomy; Upper gastrointestinal endoscopy; Biopsy thyroid; and Foot fracture surgery (Left, 02/2015). Family History:  Her family history includes Breast cancer in her mother; COPD in an other family member; Cervical cancer in her paternal aunt; Diabetes in an other family member; Heart defect in her mother; Heart disease in her father; Kidney failure in her father; Leukemia in her paternal grandfather; Lung cancer in her paternal uncle.  REVIEW OF SYSTEMS  : All other systems reviewed and negative except where noted in the History of Present Illness.  PHYSICAL EXAM: BP 126/78   Pulse 100   Ht 5' 1"$  (1.549 m)   Wt 141 lb (64 kg)   BMI 26.64 kg/m  General Appearance: Well nourished, in no apparent distress. Head:   Normocephalic and atraumatic. Eyes:  sclerae anicteric,conjunctive pink  Respiratory: Respiratory effort normal, BS equal bilaterally without rales, rhonchi, wheezing. Cardio: RRR with no MRGs. Peripheral pulses intact.  Abdomen: Soft,   Obese ,ventral hernia, active bowel sounds. mild tenderness in the RUQ. Without guarding and Without rebound. No masses. Rectal: Not evaluated Musculoskeletal: Full ROM, Normal gait. Without edema. Skin:  Dry and intact without significant lesions or rashes Neuro: Alert and  oriented x4;  No focal deficits. Psych:  Cooperative. Normal mood and affect.    Vladimir Crofts, PA-C 11:05 AM

## 2022-03-30 NOTE — Patient Instructions (Addendum)
Add on pepcid 40 mg at night Continue the aciphex 20 mg Will get AB [place]  Miralax is an osmotic laxative.  It only brings more water into the stool.  This is safe to take daily.  Can take up to 17 gram of miralax twice a day.  Mix with juice or coffee.  Start 1 capful at night for 3-4 days and reassess your response in 3-4 days.  You can increase and decrease the dose based on your response.  Remember, it can take up to 3-4 days to take effect OR for the effects to wear off.   I often pair this with benefiber in the morning to help assure the stool is not too loose.   You have been scheduled for an abdominal ultrasound at [facility] (1st floor of hospital) on 04/11/2022 at  9 am . Please arrive 30 minutes prior to your appointment for registration. Make certain not to have anything to eat or drink 6 hours prior to your appointment. Should you need to reschedule your appointment, please contact radiology at [phone]. This test typically takes about 30 minutes to perform.   Due to recent changes in healthcare laws, you may see the results of your imaging and laboratory studies on MyChart before your provider has had a chance to review them.  We understand that in some cases there may be results that are confusing or concerning to you. Not all laboratory results come back in the same time frame and the provider may be waiting for multiple results in order to interpret others.  Please give [place] 48 hours in order for your provider to thoroughly review all the results before contacting the office for clarification of your results.    Thank you for choosing [facility]  [facility]

## 2022-03-31 ENCOUNTER — Other Ambulatory Visit: Payer: Self-pay | Admitting: Family

## 2022-03-31 ENCOUNTER — Ambulatory Visit: Payer: 59

## 2022-03-31 NOTE — Telephone Encounter (Signed)
Patient states the hctz is causing SOB and sugars to run up. She does not want to take this she wants it changed. Patient called in the monring to tell us this.

## 2022-04-01 ENCOUNTER — Encounter: Payer: Self-pay | Admitting: Internal Medicine

## 2022-04-01 ENCOUNTER — Ambulatory Visit: Payer: 59 | Admitting: Internal Medicine

## 2022-04-01 VITALS — BP 132/80 | HR 90 | Ht 61.0 in | Wt 139.0 lb

## 2022-04-01 DIAGNOSIS — Z794 Long term (current) use of insulin: Secondary | ICD-10-CM | POA: Diagnosis not present

## 2022-04-01 DIAGNOSIS — E1165 Type 2 diabetes mellitus with hyperglycemia: Secondary | ICD-10-CM | POA: Diagnosis not present

## 2022-04-01 DIAGNOSIS — E041 Nontoxic single thyroid nodule: Secondary | ICD-10-CM | POA: Diagnosis not present

## 2022-04-01 DIAGNOSIS — E113299 Type 2 diabetes mellitus with mild nonproliferative diabetic retinopathy without macular edema, unspecified eye: Secondary | ICD-10-CM

## 2022-04-01 DIAGNOSIS — E785 Hyperlipidemia, unspecified: Secondary | ICD-10-CM | POA: Diagnosis not present

## 2022-04-01 MED ORDER — PIOGLITAZONE HCL 15 MG PO TABS: 15.0000 mg | ORAL_TABLET | Freq: Every day | ORAL | 3 refills | Status: DC

## 2022-04-01 MED ORDER — LANTUS SOLOSTAR 100 UNIT/ML ~~LOC~~ SOPN: 60.0000 [IU] | PEN_INJECTOR | Freq: Every day | SUBCUTANEOUS | 3 refills | Status: DC

## 2022-04-01 NOTE — Patient Instructions (Signed)
Start Actos 15 mg, 1 tablet daily  Decrease Lantus to 60 units daily  Continue Metformin 500 mg, 2 tablets twice daily    HOW TO TREAT LOW BLOOD SUGARS (Blood sugar LESS THAN 70 MG/DL) Please follow the RULE OF 15 for the treatment of hypoglycemia treatment (when your (blood sugars are less than 70 mg/dL)   STEP 1: Take 15 grams of carbohydrates when your blood sugar is low, which includes:  3-4 GLUCOSE TABS  OR 3-4 OZ OF JUICE OR REGULAR SODA OR ONE TUBE OF GLUCOSE GEL    STEP 2: RECHECK blood sugar in 15 MINUTES STEP 3: If your blood sugar is still low at the 15 minute recheck --> then, go back to STEP 1 and treat AGAIN with another 15 grams of carbohydrates.

## 2022-04-01 NOTE — Progress Notes (Signed)
Name: Stephanie Carroll  MRN/ DOB: [id number], 1955-06-28   Age/ Sex: 67 y.o., female    PCP: Sharion Balloon, FNP   Reason for Endocrinology Evaluation: Type 2 Diabetes Mellitus     Date of Initial Endocrinology Visit: 10/29/2021    PATIENT IDENTIFIER: Ms. Stephanie Carroll is a 67 y.o. female with a past medical history of T2DM, Asthma, HTN and dyslipidemia, Hx of colon cancer. . The patient presented for initial endocrinology clinic visit on 10/29/2021 for consultative assistance with her diabetes management.    HPI: Stephanie Carroll was    Diagnosed with DM 2010 Prior Medications tried/Intolerance: She was prescribed Jardiance  but did not take it.              Hemoglobin A1c ranging from   7.9% in 2023, peaking at 9.4% in 2022   She was seen by [facility] 06/2021  On her initial visit to our clinic she had an A1c of 7.9%, she was on metformin and basal insulin only, will increase basal insulin    LEFT THYROID NODULE : She is S/P FNA of the left thyroid nodule 2.7 cm nodule in 2011 . Ultrasound showed stability in 2023  SUBJECTIVE:   During the last visit (10/2021): A1c 7.9%  Today (04/01/22): Stephanie Carroll  checks her blood sugars 1 times daily. The patient has not had hypoglycemic episodes since the last clinic visit Denies local neck swelling or pain  She has chronic abdominal issues due to hx of colon cancer. Has abdominal pain , chronic diarrhea and occasional nausea.  Last yeast infection was last month which is recurrent   She is schedule to see GI with a pending CT scan due to chronic nausea and abdominal pain    HOME DIABETES REGIMEN: Metformin 500 mg XR , 2 tabs BID Lantus 66 units daily -she takes 62 units   Statin: yes ACE-I/ARB: no    METER DOWNLOAD SUMMARY: Did not bring    DIABETIC COMPLICATIONS: Microvascular complications:  Mild nonproliferative DR Denies: CKD Last eye exam: Completed 08/2021- Dr. Katy Fitch   Macrovascular complications:   Denies: CAD, PVD,  CVA   PAST HISTORY: Past Medical History:  Past Medical History:  Diagnosis Date   Allergy    Arthritis    DDD   Asthma    Colon cancer ([place]) 2008   Diabetes mellitus    Elevated liver enzymes    GERD (gastroesophageal reflux disease)    History of thyroiditis 12/18/2007   Formatting of this note might be different from the original. Thyroiditis   Hyperlipidemia    Iron deficiency anemia due to chronic blood loss 09/17/2020   Thyroid tumor, benign    Past Surgical History:  Past Surgical History:  Procedure Laterality Date   BIOPSY THYROID     X2   CESAREAN SECTION     x 1   COLON SURGERY     FOOT FRACTURE SURGERY Left 02/2015   INCISIONAL HERNIA REPAIR     NASAL SEPTUM SURGERY     PARTIAL HYSTERECTOMY     POLYPECTOMY     UPPER GASTROINTESTINAL ENDOSCOPY     WRIST GANGLION EXCISION     left    Social History:  reports that she has never smoked. She has never used smokeless tobacco. She reports that she does not drink alcohol and does not use drugs. Family History:  Family History  Problem Relation Age of Onset   COPD Other  both sides   Diabetes Other        both sides   Heart disease Father    Kidney failure Father    Lung cancer Paternal Uncle    Leukemia Paternal Grandfather    Cervical cancer Paternal Aunt        x 2    Heart defect Mother        MVP   Breast cancer Mother    Colon cancer Neg Hx    Esophageal cancer Neg Hx    Rectal cancer Neg Hx    Stomach cancer Neg Hx      HOME MEDICATIONS: Allergies as of 04/01/2022       Reactions   Fluticasone-salmeterol Palpitations   Morphine Palpitations, Other (See Comments)   REACTION: heart racing   Peanut Oil Other (See Comments), Cough   congested   Peanut-containing Drug Products Cough   Sulfa Antibiotics Other (See Comments)   REACTION: takes away appetite, makes her feel nervous, jittery   Other Other (See Comments)   Oats and Green peppers---headaches   Penicillins Rash    Sulfamethoxazole Nausea Only   Sulfur Other (See Comments)   "out of her body"   Montelukast    Ciprofloxacin Anxiety   Clindamycin Rash   Cyclobenzaprine Other (See Comments)   Loracarbef Rash      Metronidazole Other (See Comments)   Moxifloxacin Hcl In Nacl Anxiety   Penicillin G Sodium Rash        Medication List        Accurate as of April 01, 2022  5:40 PM. If you have any questions, ask your nurse or doctor.          aerochamber plus with mask inhaler Use as directed   albuterol 108 (90 Base) MCG/ACT inhaler Commonly known as: VENTOLIN HFA Inhale 1-2 puffs into the lungs every 4 (four) hours as needed. For shortness for breath   Asmanex HFA 200 MCG/ACT Aero Generic drug: Mometasone Furoate Inhale 1 puff into the lungs in the morning and at bedtime.   BLACK ELDERBERRY PO Take 1 tablet by mouth daily.   Cetirizine HCl 10 MG Caps Take 10 mg by mouth as needed.   cholecalciferol 25 MCG (1000 UT) tablet Generic drug: Cholecalciferol Take 1,000 Units by mouth daily.   diclofenac Sodium 1 % Gel Commonly known as: VOLTAREN APPLY 2 GRAMS TO AFFECTED AREA 4 TIMES A DAY   EPINEPHrine 0.3 mg/0.3 mL Soaj injection Commonly known as: EPI-PEN as needed.   famotidine 40 MG tablet Commonly known as: Pepcid Take 1 tablet (40 mg total) by mouth at bedtime.   hydrOXYzine 25 MG capsule Commonly known as: VISTARIL Take 1 capsule (25 mg total) by mouth every 8 (eight) hours as needed.   Insulin Pen Needle 32G X 4 MM Misc 1 Device by Does not apply route daily in the afternoon.   Lantus SoloStar 100 UNIT/ML Solostar Pen Generic drug: insulin glargine Inject 66 Units into the skin daily. What changed: how much to take   levalbuterol 1.25 MG/3ML nebulizer solution Commonly known as: XOPENEX Take 1.25 mg by nebulization as needed for wheezing.   metFORMIN 500 MG tablet Commonly known as: GLUCOPHAGE Take 2 tablets (1,000 mg total) by mouth 2 (two) times  daily.   NON FORMULARY Plexus Probio 5 dietary supplement   NON FORMULARY Plexus Slim Hunger Control weight management series   ondansetron 4 MG tablet Commonly known as: Zofran Take 1 tablet (4 mg total) by  mouth every 8 (eight) hours as needed for nausea or vomiting.   pioglitazone 15 MG tablet Commonly known as: Actos Take 1 tablet (15 mg total) by mouth daily. Started by: Dorita Sciara, MD   RABEprazole 20 MG tablet Commonly known as: ACIPHEX TAKE 1 TABLET BY MOUTH TWICE A DAY   rosuvastatin 5 MG tablet Commonly known as: Crestor Take 1 tablet (5 mg total) by mouth daily.   Semaglutide(0.25 or 0.5MG/DOS) 2 MG/3ML Sopn Inject 0.25 mg into the skin once a week.   vitamin B-12 50 MCG tablet Commonly known as: CYANOCOBALAMIN Take 50 mcg by mouth daily.         ALLERGIES: Allergies  Allergen Reactions   Fluticasone-Salmeterol Palpitations   Morphine Palpitations and Other (See Comments)    REACTION: heart racing   Peanut Oil Other (See Comments) and Cough    congested   Peanut-Containing Drug Products Cough   Sulfa Antibiotics Other (See Comments)    REACTION: takes away appetite, makes her feel nervous, jittery   Other Other (See Comments)    Oats and Green peppers---headaches   Penicillins Rash   Sulfamethoxazole Nausea Only   Sulfur Other (See Comments)    "out of her body"   Montelukast    Ciprofloxacin Anxiety   Clindamycin Rash   Cyclobenzaprine Other (See Comments)   Loracarbef Rash        Metronidazole Other (See Comments)   Moxifloxacin Hcl In Nacl Anxiety   Penicillin G Sodium Rash     REVIEW OF SYSTEMS: A comprehensive ROS was conducted with the patient and is negative except as per HPI    OBJECTIVE:   VITAL SIGNS: BP 132/80 (BP Location: Left Arm, Patient Position: Sitting, Cuff Size: Small)   Pulse 90   Ht 5' 1"$  (1.549 m)   Wt 139 lb (63 kg)   SpO2 98%   BMI 26.26 kg/m    PHYSICAL EXAM:  General: Pt appears well and  is in NAD  Lungs: Clear with good BS bilat with no rales, rhonchi, or wheezes  Heart: RRR   Extremities:  Lower extremities - No pretibial edema. No lesions.  Neuro: MS is good with appropriate affect, pt is alert and Ox3     DATA REVIEWED:  Lab Results  Component Value Date   HGBA1C 9.3 (H) 02/21/2022   HGBA1C 7.9 (H) 10/19/2021    Latest Reference Range & Units 10/19/21 11:51  Sodium 134 - 144 mmol/L 140  Potassium 3.5 - 5.2 mmol/L 4.7  Chloride 96 - 106 mmol/L 102  CO2 20 - 29 mmol/L 22  Glucose 70 - 99 mg/dL 186 (H)  BUN 8 - 27 mg/dL 9  Creatinine 0.57 - 1.00 mg/dL 0.66  Calcium 8.7 - 10.3 mg/dL 9.8  BUN/Creatinine Ratio 12 - 28  14  eGFR >59 mL/min/1.73 98  Alkaline Phosphatase 44 - 121 IU/L 98  Albumin 3.9 - 4.9 g/dL 4.8  Albumin/Globulin Ratio 1.2 - 2.2  1.8  AST 0 - 40 IU/L 52 (H)  ALT 0 - 32 IU/L 67 (H)  Total Protein 6.0 - 8.5 g/dL 7.5  Total Bilirubin 0.0 - 1.2 mg/dL 0.4  Total CHOL/HDL Ratio 0.0 - 4.4 ratio 5.9 (H)  Cholesterol, Total 100 - 199 mg/dL 208 (H)  HDL Cholesterol >39 mg/dL 35 (L)  Triglycerides 0 - 149 mg/dL 276 (H)  VLDL Cholesterol Cal 5 - 40 mg/dL 49 (H)  LDL Chol Calc (NIH) 0 - 99 mg/dL 124 (H)  ASSESSMENT / PLAN / RECOMMENDATIONS:   1) Type 2 Diabetes Mellitus, poorly controlled, With retinopathic  complications - Most recent A1c of 9.3 %. Goal A1c < 7.0 %.     -Patient with worsening glycemic control -She has not increased her insulin, she has fear of hypoglycemia even though she has not experienced hypoglycemic episodes -The patient tends to adjust her basal insulin depending on bedtime BG, I did explain to the patient that normal fasting BG's 70-130 mg/DL  -We discussed the pharmacokinetics of basal insulin and she should not be adjusting it based on bedtime BG's, will decrease the dose if her fasting BG's <70 mg/dL, but if she is worried about this she may contact us with persistent BG's of <100 mg/dL -Not a candidate for GLP-1  agonists due to chronic GI issues since her colon cancer treatment -Today we discussed add-on therapy again which SGLT2 inhibitors, but she is skeptical about side effects, patient agreed to start pioglitazone, cautioned against peripheral edema  MEDICATIONS:   Start pioglitazone 15 mg daily Decrease Lantus 60 units daily Continue metformin 500 mg, 2 tabs twice daily  EDUCATION / INSTRUCTIONS: BG monitoring instructions: Patient is instructed to check her blood sugars 2 times a day. Call [facility] if: BG persistently < 70  I reviewed the Rule of 15 for the treatment of hypoglycemia in detail with the patient. Literature supplied.   2) Diabetic complications:  Eye: Does  have known diabetic retinopathy.  Neuro/ Feet: Does not have known diabetic peripheral neuropathy. Renal: Patient does not at have known baseline CKD. She is  on an ACEI/ARB at present.  3) Dyslipidemia:   -LDL above goal at 124 mg/DL, she was prescribed Crestor by her PCP that she did not start -We discussed the cardiovascular benefits of statin therapy, and I have encouraged her to start this on her last visit 10/2021  Medication rosuvastatin 5 mg daily   4) left thyroid nodule:  -This was an incidental finding during  colon cancer imaging.  She is s/p benign FNA of the left 2.7 thyroid nodule with benign cytology -Repeat thyroid ultrasound 11/2021 showed a 12-year stability and no further follow-up was recommended  Follow-up in 6 months  Signed electronically by: Mack Guise, MD  [facility]  [facility] [address] [place], [place] [address] Phone: [phone] FAX: [phone]   CC: Sharion Balloon, [place] [address] Phone: [phone]  Fax: [phone]    Return to Endocrinology clinic as below: Future Appointments  Date Time Provider [place]  04/11/2022  9:30 AM [facility] 2 [facility]-US  [place]  05/20/2022 11:00 AM Vladimir Crofts, PA-C [facility]  05/23/2022 11:40 AM Sharion Balloon, FNP [facility] None  06/17/2022 10:30 AM [facility] LAB [facility] None  06/17/2022 10:45 AM Marin Olp, Rudell Cobb, MD [facility] None  10/10/2022 10:50 AM Yanira Tolsma, Melanie Crazier, MD [facility] None

## 2022-04-04 ENCOUNTER — Telehealth: Payer: Self-pay | Admitting: Physician Assistant

## 2022-04-04 NOTE — Telephone Encounter (Signed)
Pts ultrasound is now scheduled for 04/07/22.

## 2022-04-04 NOTE — Telephone Encounter (Signed)
Inbound call from patient, wishing to move up her [place] appointment at [facility]. I transferred the call to radiology so patient could reschedule. Patient stated the reason she wanted to move up was because she has been experiencing shortness of breath and had to lay with a heating pad all day yesterday. She wanted to let nurse and Estill Bamberg know of her recent symptoms.

## 2022-04-07 ENCOUNTER — Ambulatory Visit (HOSPITAL_COMMUNITY)
Admission: RE | Admit: 2022-04-07 | Discharge: 2022-04-07 | Disposition: A | Discharge status: Home / Self Care | Payer: 59 | Source: Ambulatory Visit | Attending: Physician Assistant | Admitting: Physician Assistant

## 2022-04-07 DIAGNOSIS — K219 Gastro-esophageal reflux disease without esophagitis: Secondary | ICD-10-CM | POA: Insufficient documentation

## 2022-04-07 DIAGNOSIS — R1011 Right upper quadrant pain: Secondary | ICD-10-CM | POA: Insufficient documentation

## 2022-04-07 DIAGNOSIS — K760 Fatty (change of) liver, not elsewhere classified: Secondary | ICD-10-CM | POA: Diagnosis not present

## 2022-04-08 NOTE — Telephone Encounter (Signed)
Spoke with pt and she states she saw the results on mychart and she is aware.

## 2022-04-08 NOTE — Telephone Encounter (Signed)
Patient called wanting to know if her results had come back to [place] yet, she stated by now she normally has them on Mychart but does not have anything. Please advise.

## 2022-04-11 ENCOUNTER — Ambulatory Visit (HOSPITAL_COMMUNITY): Payer: 59

## 2022-04-28 ENCOUNTER — Ambulatory Visit: Payer: 59 | Admitting: Nutrition

## 2022-04-28 ENCOUNTER — Ambulatory Visit: Payer: 59 | Admitting: Nurse Practitioner

## 2022-04-29 ENCOUNTER — Ambulatory Visit: Payer: 59 | Admitting: Family Medicine

## 2022-04-29 ENCOUNTER — Encounter: Payer: Self-pay | Admitting: Family Medicine

## 2022-04-29 VITALS — BP 154/72 | HR 100 | Temp 98.9°F | Ht 61.0 in | Wt 140.2 lb

## 2022-04-29 DIAGNOSIS — N6019 Diffuse cystic mastopathy of unspecified breast: Secondary | ICD-10-CM

## 2022-04-29 DIAGNOSIS — R0981 Nasal congestion: Secondary | ICD-10-CM | POA: Diagnosis not present

## 2022-04-29 DIAGNOSIS — Z20828 Contact with and (suspected) exposure to other viral communicable diseases: Secondary | ICD-10-CM | POA: Diagnosis not present

## 2022-04-29 LAB — VERITOR FLU A/B WAIVED
Influenza A: NEGATIVE
Influenza B: NEGATIVE

## 2022-04-29 NOTE — Progress Notes (Signed)
   Acute Office Visit  Subjective:     Patient ID: Stephanie Carroll, female    DOB: Sep 13, 1955, 67 y.o.   MRN: [id number]  Chief Complaint  Patient presents with   Breast Mass    HPI Patient is in today for bead like knots in her left breast for 1 week with some tenderness. She denies nipple discharge or retraction. Denies changes in skin, erythema, or rash. She does have a history of fibrocystic cysts. She is UTD on breast cancer screening with last mammogram done on 01/25/22 that was normal with recommended follow up in 1 year.   She also has had close exposure to the flu. She has had some nasal congestion this morning and had some chills yesterday. She would like to be tested for this today. She has also been outside lately and has seasonal allergies.   ROS As per HPI.      Objective:    BP (!) 154/72   Pulse 100   Temp 98.9 F (37.2 C) (Temporal)   Ht 5\' 1"  (1.549 m)   Wt 140 lb 4 oz (63.6 kg)   SpO2 96%   BMI 26.50 kg/m    Physical Exam Vitals and nursing note reviewed.  Constitutional:      General: She is not in acute distress.    Appearance: She is not ill-appearing, toxic-appearing or diaphoretic.  HENT:     Nose: Congestion present.     Mouth/Throat:     Mouth: Mucous membranes are moist.     Pharynx: Oropharynx is clear.  Cardiovascular:     Rate and Rhythm: Normal rate and regular rhythm.     Heart sounds: Normal heart sounds. No murmur heard. Pulmonary:     Effort: Pulmonary effort is normal. No respiratory distress.     Breath sounds: Normal breath sounds.  Chest:  Breasts:    Left: No swelling, bleeding, inverted nipple, mass, nipple discharge, skin change or tenderness.  Musculoskeletal:     Cervical back: Neck supple. No rigidity.  Lymphadenopathy:     Upper Body:     Left upper body: No supraclavicular, axillary or pectoral adenopathy.  Skin:    General: Skin is warm and dry.  Neurological:     Mental Status: She is alert.     No results  found for any visits on 04/29/22.      Assessment & Plan:   Stephanie Carroll was seen today for breast mass.  Diagnoses and all orders for this visit:  Nasal congestion Exposure to the flu Negative rapid flu today. Discussed symptomatic care and return precautions.  -     Veritor Flu A/B Waived  Fibrocystic breast disease (FCBD), unspecified laterality Benign exam today. Normal UTD mammogram on 01/25/22. Reassured patient. Strict return precautions given.   The patient indicates understanding of these issues and agrees with the plan.  Gwenlyn Perking, FNP

## 2022-05-04 ENCOUNTER — Ambulatory Visit: Payer: 59 | Admitting: Internal Medicine

## 2022-05-16 ENCOUNTER — Other Ambulatory Visit: Payer: Self-pay

## 2022-05-16 ENCOUNTER — Other Ambulatory Visit: Payer: Self-pay | Admitting: Internal Medicine

## 2022-05-16 ENCOUNTER — Telehealth: Payer: Self-pay | Admitting: Physician Assistant

## 2022-05-16 DIAGNOSIS — E1165 Type 2 diabetes mellitus with hyperglycemia: Secondary | ICD-10-CM

## 2022-05-16 MED ORDER — TRESIBA FLEXTOUCH 200 UNIT/ML ~~LOC~~ SOPN: 60.0000 [IU] | PEN_INJECTOR | Freq: Every day | SUBCUTANEOUS | 3 refills | Status: DC

## 2022-05-16 MED ORDER — LANTUS SOLOSTAR 100 UNIT/ML ~~LOC~~ SOPN: 60.0000 [IU] | PEN_INJECTOR | Freq: Every day | SUBCUTANEOUS | 0 refills | Status: DC

## 2022-05-16 NOTE — Telephone Encounter (Signed)
Does the patient still want to be called back since she cancelled her appt?

## 2022-05-16 NOTE — Telephone Encounter (Signed)
Inbound call from patient, would like to discuss having a sooner colonoscopy, patient last had colonoscopy in 2022 and states she usually has them every three years. States she had cancer previously and does not want to wait until 2027. Patient was advised she could discuss that on her visit for 4/5 with Vicie Mutters. Patient refused and cancelled appointment.

## 2022-05-16 NOTE — Telephone Encounter (Signed)
Pt states she has a history of colon cancer and usually has her procedures every 3 years. She states she had precancerous polyps in 2021. She started having some bleeding and had another colon in 2022 and a 5 year recall was entered. Pt wants to make sure she doesn't need to have repeat colon 3 years from colon done in 2022 and not 5 years. Please advise.

## 2022-05-20 ENCOUNTER — Ambulatory Visit: Payer: 59 | Admitting: Physician Assistant

## 2022-05-23 ENCOUNTER — Ambulatory Visit: Payer: 59 | Admitting: Family

## 2022-05-23 NOTE — Telephone Encounter (Signed)
Pt calling back about colon recall date. Please see notes below and advise.

## 2022-05-23 NOTE — Telephone Encounter (Signed)
PT is calling to get an update on her last colonoscopy date. Please advise.

## 2022-05-24 ENCOUNTER — Other Ambulatory Visit: Payer: Self-pay | Admitting: Gastroenterology

## 2022-05-26 NOTE — Telephone Encounter (Signed)
Pt scheduled to see Dr. Lavon Paganini 06/15/22 at 3pm, pt aware of appt.

## 2022-05-26 NOTE — Telephone Encounter (Signed)
Please schedule office follow-up visit with me soon, I can discuss with patient and addressed all her concerns.  Thank you

## 2022-05-31 NOTE — Progress Notes (Signed)
Reviewed and agree with documentation and assessment and plan. K. Veena Cornie Mccomber , MD   

## 2022-06-07 ENCOUNTER — Encounter: Payer: 59 | Attending: Family | Admitting: Nutrition

## 2022-06-07 VITALS — Ht 61.0 in | Wt 144.4 lb

## 2022-06-07 DIAGNOSIS — E559 Vitamin D deficiency, unspecified: Secondary | ICD-10-CM | POA: Insufficient documentation

## 2022-06-07 DIAGNOSIS — Z794 Long term (current) use of insulin: Secondary | ICD-10-CM | POA: Diagnosis not present

## 2022-06-07 DIAGNOSIS — E1169 Type 2 diabetes mellitus with other specified complication: Secondary | ICD-10-CM | POA: Insufficient documentation

## 2022-06-07 DIAGNOSIS — E1159 Type 2 diabetes mellitus with other circulatory complications: Secondary | ICD-10-CM | POA: Diagnosis not present

## 2022-06-07 DIAGNOSIS — E785 Hyperlipidemia, unspecified: Secondary | ICD-10-CM | POA: Diagnosis not present

## 2022-06-07 DIAGNOSIS — E119 Type 2 diabetes mellitus without complications: Secondary | ICD-10-CM | POA: Diagnosis not present

## 2022-06-07 DIAGNOSIS — K760 Fatty (change of) liver, not elsewhere classified: Secondary | ICD-10-CM | POA: Diagnosis not present

## 2022-06-07 DIAGNOSIS — E538 Deficiency of other specified B group vitamins: Secondary | ICD-10-CM | POA: Diagnosis not present

## 2022-06-07 DIAGNOSIS — I152 Hypertension secondary to endocrine disorders: Secondary | ICD-10-CM | POA: Diagnosis not present

## 2022-06-07 NOTE — Patient Instructions (Signed)
Goals  Walk 15 minutes BID when able Increase fresh fruits and vegetables and whole grains. Get A1C to 6.5% or less.

## 2022-06-07 NOTE — Progress Notes (Signed)
Medical Nutrition Therapy  Appointment Start time:  1030  Appointment End time:  1100  Primary concerns today: Dm Type 2 Referral diagnosis: E11.8 Preferred learning style: No preference  Learning readiness: Ready   NUTRITION ASSESSMENT Follow up  FBS's 120-130's   Night times; 190-230's had been on steriods due to some breathing issues. She has been checking it an hour after dinner instead of 2 hours. Changes made: has increasing eating more vegetables, grilled chicken, Sees Dr. Lonzo Cloud,  Lantus 60 units, Metformin 1000 mg BID, Actos 15 mg a day.  Scheduled to see oncologist and PCP in the next few weeks. Last A1C was after she had been on steriod injections.    66 y old wfemale referred for diabetes education by Dr. Lonzo Cloud. She has been a diabetic over 10 yrs and hasn't been to see a dietitian. "I need to learn how to read food labels and learn how to count carbohydrates. I want to get my diabetes under better control and reduce my insulin."  PMH: HTN, GERD, Asthma, Hyperlipidemia, IBS, DDD, Anemia, h/o colon cancer. Liver enzymes are elevated.  A1C 7.9%.  She is wanting to learn how to eat  the right foods and reduce her insulin and not keep increasing it. Currently taking 62-66 units of Lantus at night. She admits sometimes she forgets her insulin at night.  Hyperlipidemia. Can't take the medication prescribed. It make her shoulder hurt a lot. Willing to work on eating a high fiber diet to provider 25-30 g fiber per day.  She is wiling to work with Lifestyle Medicine  of whole food plant based foods to help reduce her blood sugars and reduce insulin needs, reduce cholesterol levels, and  improve her overall health.   Anthropometrics  Wt Readings from Last 3 Encounters:  04/29/22 140 lb 4 oz (63.6 kg)  04/01/22 139 lb (63 kg)  03/30/22 141 lb (64 kg)   Ht Readings from Last 3 Encounters:  04/29/22 5\' 1"  (1.549 m)  04/01/22 5\' 1"  (1.549 m)  03/30/22 5\' 1"  (1.549  m)   There is no height or weight on file to calculate BMI. @BMIFA @ Facility age limit for growth %iles is 20 years. Facility age limit for growth %iles is 20 years.   Lipid Panel     Component Value Date/Time   CHOL 208 (H) 10/19/2021 1151   TRIG 276 (H) 10/19/2021 1151   HDL 35 (L) 10/19/2021 1151   CHOLHDL 5.9 (H) 10/19/2021 1151   CHOLHDL 6 03/08/2019 1603   VLDL 59.8 (H) 03/08/2019 1603   LDLCALC 124 (H) 10/19/2021 1151   LDLDIRECT 142.0 03/08/2019 1603   LABVLDL 49 (H) 10/19/2021 1151      Latest Ref Rng & Units 03/25/2022   10:17 AM 03/19/2022   11:15 AM 02/27/2022    6:25 AM  CMP  Glucose 70 - 99 mg/dL 478  295  621   BUN 8 - 27 mg/dL 12  12  26    Creatinine 0.57 - 1.00 mg/dL 3.08  6.57  8.46   Sodium 134 - 144 mmol/L 138  138  138   Potassium 3.5 - 5.2 mmol/L 4.7  3.5  4.3   Chloride 96 - 106 mmol/L 97  102  105   CO2 20 - 29 mmol/L 20  22  18    Calcium 8.7 - 10.3 mg/dL 96.2  9.2  9.5   Total Protein 6.0 - 8.5 g/dL 7.2  7.7    Total Bilirubin 0.0 - 1.2  mg/dL 0.4  0.6    Alkaline Phos 44 - 121 IU/L 82  69    AST 0 - 40 IU/L 50  56    ALT 0 - 32 IU/L 65  66      Clinical Medical Hx: See chart Medications: Lantus 66 units. Labs:  Lab Results  Component Value Date   HGBA1C 9.3 (H) 02/21/2022      Latest Ref Rng & Units 03/25/2022   10:17 AM 03/19/2022   11:15 AM 02/27/2022    6:25 AM  CMP  Glucose 70 - 99 mg/dL 500  938  182   BUN 8 - 27 mg/dL 12  12  26    Creatinine 0.57 - 1.00 mg/dL 9.93  7.16  9.67   Sodium 134 - 144 mmol/L 138  138  138   Potassium 3.5 - 5.2 mmol/L 4.7  3.5  4.3   Chloride 96 - 106 mmol/L 97  102  105   CO2 20 - 29 mmol/L 20  22  18    Calcium 8.7 - 10.3 mg/dL 89.3  9.2  9.5   Total Protein 6.0 - 8.5 g/dL 7.2  7.7    Total Bilirubin 0.0 - 1.2 mg/dL 0.4  0.6    Alkaline Phos 44 - 121 IU/L 82  69    AST 0 - 40 IU/L 50  56    ALT 0 - 32 IU/L 65  66     Lipid Panel     Component Value Date/Time   CHOL 208 (H) 10/19/2021 1151   TRIG  276 (H) 10/19/2021 1151   HDL 35 (L) 10/19/2021 1151   CHOLHDL 5.9 (H) 10/19/2021 1151   CHOLHDL 6 03/08/2019 1603   VLDL 59.8 (H) 03/08/2019 1603   LDLCALC 124 (H) 10/19/2021 1151   LDLDIRECT 142.0 03/08/2019 1603   LABVLDL 49 (H) 10/19/2021 1151    Notable Signs/Symptoms: none  Lifestyle & Dietary Hx Lives with her husband. Eats 3 meals per day usually.  Estimated daily fluid intake: 40 oz Supplements: Elderberry, Vit D, Plexus supplement Sleep: good Stress / self-care:  Current average weekly physical activity: walks  24-Hr Dietary Recall Egg, 1 slice toast and some prunes, Lunch: deli [place] on whole wheat, Diet Sprite. DInner:  pinto beans, stewed potatoes and green peas, water    Calories: 1200 Carbohydrate: 135g Protein: 90g Fat: 33g   NUTRITION DIAGNOSIS  NB-1.1 Food and nutrition-related knowledge deficit As related to Diabetes Type 2.  As evidenced by A1C 7.9%..   NUTRITION INTERVENTION  Nutrition education (E-1) on the following topics:  Nutrition and Diabetes education provided on My Plate, CHO counting, meal planning, portion sizes, timing of meals, avoiding snacks between meals unless having a low blood sugar, target ranges for A1C and blood sugars, signs/symptoms and treatment of hyper/hypoglycemia, monitoring blood sugars, taking medications as prescribed, benefits of exercising 30 minutes per day and prevention of complications of DM. Lifestyle Medicine  - Whole Food, Plant Predominant Nutrition is highly recommended: Eat Plenty of vegetables, Mushrooms, fruits, Legumes, Whole Grains, Nuts, seeds in lieu of processed meats, processed snacks/pastries red meat, poultry, eggs.    -It is better to avoid simple carbohydrates including: Cakes, Sweet Desserts, Ice Cream, Soda (diet and regular), Sweet Tea, Candies, Chips, Cookies, Store Bought Juices, Alcohol in Excess of  1-2 drinks a day, Lemonade,  Artificial Sweeteners, Doughnuts, Coffee Creamers,  "Sugar-free" Products, etc, etc.  This is not a complete list.....  Exercise: If you are able: 30 -60  minutes a day ,4 days a week, or 150 minutes a week.  The longer the better.  Combine stretch, strength, and aerobic activities.  If you were told in the past that you have high risk for cardiovascular diseases, you may seek evaluation by your heart doctor prior to initiating moderate to intense exercise programs.   Handouts Provided Include  Lifestyle medicine handouts Hypoglycemia handout  Learning Style & Readiness for Change Teaching method utilized: Visual & Auditory  Demonstrated degree of understanding via: Teach Back  Barriers to learning/adherence to lifestyle change: none  Goals Established by Pt  Walk 15 minutes BID when able Increase fresh fruits and vegetables and whole grains. Get A1C to 6.5% or less.   MONITORING & EVALUATION Dietary intake, weekly physical activity, and blood sugars in 3-6 months.  Next Steps  Patient is to work on meal prepping and planning.

## 2022-06-15 ENCOUNTER — Other Ambulatory Visit: Payer: Self-pay | Admitting: Family

## 2022-06-15 ENCOUNTER — Ambulatory Visit: Payer: 59 | Admitting: Gastroenterology

## 2022-06-15 DIAGNOSIS — M19042 Primary osteoarthritis, left hand: Secondary | ICD-10-CM

## 2022-06-16 ENCOUNTER — Other Ambulatory Visit: Payer: Self-pay

## 2022-06-16 DIAGNOSIS — C182 Malignant neoplasm of ascending colon: Secondary | ICD-10-CM

## 2022-06-17 ENCOUNTER — Inpatient Hospital Stay: Payer: Medicare Other | Admitting: Family

## 2022-06-17 ENCOUNTER — Encounter: Payer: Self-pay | Admitting: Family

## 2022-06-17 ENCOUNTER — Other Ambulatory Visit: Payer: Self-pay

## 2022-06-17 ENCOUNTER — Encounter: Payer: Self-pay | Admitting: Hematology & Oncology

## 2022-06-17 ENCOUNTER — Inpatient Hospital Stay: Payer: Medicare Other | Attending: Family

## 2022-06-17 VITALS — BP 137/71 | HR 95 | Temp 98.0°F | Resp 17 | Ht 61.0 in | Wt 141.0 lb

## 2022-06-17 DIAGNOSIS — Z880 Allergy status to penicillin: Secondary | ICD-10-CM | POA: Diagnosis not present

## 2022-06-17 DIAGNOSIS — C182 Malignant neoplasm of ascending colon: Secondary | ICD-10-CM | POA: Diagnosis not present

## 2022-06-17 DIAGNOSIS — Z882 Allergy status to sulfonamides status: Secondary | ICD-10-CM | POA: Diagnosis not present

## 2022-06-17 DIAGNOSIS — D500 Iron deficiency anemia secondary to blood loss (chronic): Secondary | ICD-10-CM

## 2022-06-17 DIAGNOSIS — Z885 Allergy status to narcotic agent status: Secondary | ICD-10-CM | POA: Insufficient documentation

## 2022-06-17 DIAGNOSIS — Z79899 Other long term (current) drug therapy: Secondary | ICD-10-CM | POA: Diagnosis not present

## 2022-06-17 DIAGNOSIS — M79642 Pain in left hand: Secondary | ICD-10-CM | POA: Diagnosis not present

## 2022-06-17 DIAGNOSIS — Z881 Allergy status to other antibiotic agents status: Secondary | ICD-10-CM | POA: Diagnosis not present

## 2022-06-17 DIAGNOSIS — M79641 Pain in right hand: Secondary | ICD-10-CM | POA: Insufficient documentation

## 2022-06-17 LAB — CBC WITH DIFFERENTIAL (CANCER CENTER ONLY)
Abs Immature Granulocytes: 0.03 10*3/uL (ref 0.00–0.07)
Basophils Absolute: 0.1 10*3/uL (ref 0.0–0.1)
Basophils Relative: 1 %
Eosinophils Absolute: 0.2 10*3/uL (ref 0.0–0.5)
Eosinophils Relative: 3 %
HCT: 39.6 % (ref 36.0–46.0)
Hemoglobin: 13.3 g/dL (ref 12.0–15.0)
Immature Granulocytes: 0 %
Lymphocytes Relative: 28 %
Lymphs Abs: 2.5 10*3/uL (ref 0.7–4.0)
MCH: 30.2 pg (ref 26.0–34.0)
MCHC: 33.6 g/dL (ref 30.0–36.0)
MCV: 90 fL (ref 80.0–100.0)
Monocytes Absolute: 0.5 10*3/uL (ref 0.1–1.0)
Monocytes Relative: 6 %
Neutro Abs: 5.5 10*3/uL (ref 1.7–7.7)
Neutrophils Relative %: 62 %
Platelet Count: 176 10*3/uL (ref 150–400)
RBC: 4.4 MIL/uL (ref 3.87–5.11)
RDW: 12.3 % (ref 11.5–15.5)
WBC Count: 8.9 10*3/uL (ref 4.0–10.5)
nRBC: 0 % (ref 0.0–0.2)

## 2022-06-17 LAB — FERRITIN: Ferritin: 81 ng/mL (ref 11–307)

## 2022-06-17 LAB — LACTATE DEHYDROGENASE: LDH: 117 U/L (ref 98–192)

## 2022-06-17 LAB — CEA (ACCESS): CEA (CHCC): 2.87 ng/mL (ref 0.00–5.00)

## 2022-06-17 LAB — CMP (CANCER CENTER ONLY)
ALT: 44 U/L (ref 0–44)
AST: 37 U/L (ref 15–41)
Albumin: 4.5 g/dL (ref 3.5–5.0)
Alkaline Phosphatase: 88 U/L (ref 38–126)
Anion gap: 12 (ref 5–15)
BUN: 15 mg/dL (ref 8–23)
CO2: 28 mmol/L (ref 22–32)
Calcium: 10.1 mg/dL (ref 8.9–10.3)
Chloride: 99 mmol/L (ref 98–111)
Creatinine: 0.82 mg/dL (ref 0.44–1.00)
GFR, Estimated: >60 mL/min (ref >60)
Glucose, Bld: 155 mg/dL — ABNORMAL HIGH (ref 70–99)
Potassium: 4.4 mmol/L (ref 3.5–5.1)
Sodium: 139 mmol/L (ref 135–145)
Total Bilirubin: 0.4 mg/dL (ref 0.3–1.2)
Total Protein: 8.2 g/dL — ABNORMAL HIGH (ref 6.5–8.1)

## 2022-06-17 LAB — IRON AND IRON BINDING CAPACITY (CC-WL,HP ONLY)
Iron: 63 ug/dL (ref 28–170)
Saturation Ratios: 17 % (ref 10.4–31.8)
TIBC: 371 ug/dL (ref 250–450)
UIBC: 308 ug/dL (ref 148–442)

## 2022-06-17 NOTE — Progress Notes (Signed)
Hematology and Oncology Follow Up Visit  Stephanie Carroll [id number] 1955/08/04 67 y.o. 06/17/2022   Principle Diagnosis:  Stage II carcinoma of colon   Current Therapy:        Observation   Interim History:  Ms. Stephanie Carroll is here today for follow-up and treatment. She is doing well but has had issues with arthritis pain in her hands. This has kept her from resting well at night.  She just started using horse liniment and it seems to be helping.  No falls or syncope reported.  No numbness or tingling in her extremities at this time.  No fever, chills, n/v, cough, rash, dizziness, SOB, chest pain, palpitations, abdominal pain or changes in bowel or bladder habits.  Appetite and hydration are good. Weight is stable at 141 lbs.   ECOG Performance Status: 1 - Symptomatic but completely ambulatory  Medications:  Allergies as of 06/17/2022       Reactions   Fluticasone-salmeterol Palpitations   Morphine Palpitations, Other (See Comments)   REACTION: heart racing   Peanut Oil Other (See Comments), Cough   congested   Peanut-containing Drug Products Cough   Sulfa Antibiotics Other (See Comments)   REACTION: takes away appetite, makes her feel nervous, jittery   Other Other (See Comments)   Oats and Green peppers---headaches   Penicillins Rash   Sulfamethoxazole Nausea Only   Sulfur Other (See Comments)   "out of her body"   Montelukast    Ciprofloxacin Anxiety   Clindamycin Rash   Cyclobenzaprine Other (See Comments)   Loracarbef Rash      Metronidazole Other (See Comments)   Moxifloxacin Hcl In Nacl Anxiety   Penicillin G Sodium Rash        Medication List        Accurate as of Jun 17, 2022 11:49 AM. If you have any questions, ask your nurse or doctor.          aerochamber plus with mask inhaler Use as directed   albuterol 108 (90 Base) MCG/ACT inhaler Commonly known as: VENTOLIN HFA Inhale 1-2 puffs into the lungs every 4 (four) hours as needed. For shortness for  breath   Asmanex HFA 200 MCG/ACT Aero Generic drug: Mometasone Furoate Inhale 1 puff into the lungs in the morning and at bedtime.   BLACK ELDERBERRY PO Take 1 tablet by mouth daily.   Cetirizine HCl 10 MG Caps Take 10 mg by mouth as needed.   cholecalciferol 25 MCG (1000 UNIT) tablet Commonly known as: VITAMIN D3 Take 1,000 Units by mouth daily.   diclofenac Sodium 1 % Gel Commonly known as: VOLTAREN APPLY 2 GRAMS TO AFFECTED AREA 4 TIMES A DAY   EPINEPHrine 0.3 mg/0.3 mL Soaj injection Commonly known as: EPI-PEN as needed.   famotidine 40 MG tablet Commonly known as: Pepcid Take 1 tablet (40 mg total) by mouth at bedtime.   hydrOXYzine 25 MG capsule Commonly known as: VISTARIL Take 1 capsule (25 mg total) by mouth every 8 (eight) hours as needed.   Insulin Pen Needle 32G X 4 MM Misc 1 Device by Does not apply route daily in the afternoon.   levalbuterol 1.25 MG/3ML nebulizer solution Commonly known as: XOPENEX Take 1.25 mg by nebulization as needed for wheezing.   metFORMIN 500 MG tablet Commonly known as: GLUCOPHAGE Take 2 tablets (1,000 mg total) by mouth 2 (two) times daily.   NON FORMULARY Plexus Probio 5 dietary supplement   NON FORMULARY Plexus Slim Hunger Control weight management series  ondansetron 4 MG tablet Commonly known as: Zofran Take 1 tablet (4 mg total) by mouth every 8 (eight) hours as needed for nausea or vomiting.   pioglitazone 15 MG tablet Commonly known as: Actos Take 1 tablet (15 mg total) by mouth daily.   RABEprazole 20 MG tablet Commonly known as: ACIPHEX TAKE 1 TABLET BY MOUTH TWICE A DAY   rosuvastatin 5 MG tablet Commonly known as: Crestor Take 1 tablet (5 mg total) by mouth daily.   Evaristo Bury FlexTouch 200 UNIT/ML FlexTouch Pen Generic drug: insulin degludec Inject 60 Units into the skin daily in the afternoon.   vitamin B-12 50 MCG tablet Commonly known as: CYANOCOBALAMIN Take 50 mcg by mouth daily.         Allergies:  Allergies  Allergen Reactions   Fluticasone-Salmeterol Palpitations   Morphine Palpitations and Other (See Comments)    REACTION: heart racing   Peanut Oil Other (See Comments) and Cough    congested   Peanut-Containing Drug Products Cough   Sulfa Antibiotics Other (See Comments)    REACTION: takes away appetite, makes her feel nervous, jittery   Other Other (See Comments)    Oats and Green peppers---headaches   Penicillins Rash   Sulfamethoxazole Nausea Only   Sulfur Other (See Comments)    "out of her body"   Montelukast    Ciprofloxacin Anxiety   Clindamycin Rash   Cyclobenzaprine Other (See Comments)   Loracarbef Rash        Metronidazole Other (See Comments)   Moxifloxacin Hcl In Nacl Anxiety   Penicillin G Sodium Rash    Past Medical History, Surgical history, Social history, and Family History were reviewed and updated.  Review of Systems: All other 10 point review of systems is negative.   Physical Exam:  height is 5\' 1"  (1.549 m) and weight is 141 lb (64 kg). Her oral temperature is 98 F (36.7 C). Her blood pressure is 137/71 and her pulse is 95. Her respiration is 17 and oxygen saturation is 98%.   Wt Readings from Last 3 Encounters:  06/17/22 141 lb (64 kg)  06/07/22 144 lb 6.4 oz (65.5 kg)  04/29/22 140 lb 4 oz (63.6 kg)    Ocular: Sclerae unicteric, pupils equal, round and reactive to light Ear-nose-throat: Oropharynx clear, dentition fair Lymphatic: No cervical or supraclavicular adenopathy Lungs no rales or rhonchi, good excursion bilaterally Heart regular rate and rhythm, no murmur appreciated Abd soft, nontender, positive bowel sounds MSK no focal spinal tenderness, no joint edema Neuro: non-focal, well-oriented, appropriate affect Breasts: Deferred  Lab Results  Component Value Date   WBC 8.9 06/17/2022   HGB 13.3 06/17/2022   HCT 39.6 06/17/2022   MCV 90.0 06/17/2022   PLT 176 06/17/2022   Lab Results  Component  Value Date   FERRITIN 207 03/23/2021   IRON 73 03/23/2021   TIBC 343 03/23/2021   UIBC 270 03/23/2021   IRONPCTSAT 21 03/23/2021   Lab Results  Component Value Date   RETICCTPCT 2.0 03/01/2007   RBC 4.40 06/17/2022   RETICCTABS 101.4 03/01/2007   No results found for: "KPAFRELGTCHN", "LAMBDASER", "KAPLAMBRATIO" Lab Results  Component Value Date   IGA 371 06/19/2018   No results found for: "TOTALPROTELP", "ALBUMINELP", "A1GS", "A2GS", "BETS", "BETA2SER", "GAMS", "MSPIKE", "SPEI"   Chemistry      Component Value Date/Time   NA 139 06/17/2022 1027   NA 138 03/25/2022 1017   NA 141 11/29/2013 1454   K 4.4 06/17/2022 1027   K  4.1 04/22/2016 1456   K 3.8 11/29/2013 1454   CL 99 06/17/2022 1027   CL 95 (L) 04/22/2016 1456   CL 98 11/29/2013 1454   CO2 28 06/17/2022 1027   CO2 21 04/22/2016 1456   CO2 25 11/29/2013 1454   BUN 15 06/17/2022 1027   BUN 12 03/25/2022 1017   BUN 12 11/29/2013 1454   CREATININE 0.82 06/17/2022 1027   CREATININE 0.55 (L) 04/22/2016 1456   CREATININE 0.8 11/29/2013 1454      Component Value Date/Time   CALCIUM 10.1 06/17/2022 1027   CALCIUM 9.5 04/22/2016 1456   CALCIUM 9.2 11/29/2013 1454   ALKPHOS 88 06/17/2022 1027   ALKPHOS 103 04/22/2016 1456   ALKPHOS 93 (H) 11/29/2013 1454   AST 37 06/17/2022 1027   ALT 44 06/17/2022 1027   ALT 36 11/29/2013 1454   BILITOT 0.4 06/17/2022 1027       Impression and Plan: Ms. Montney is a very pleasant 67 yo caucasian female with history of 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 She did not require any adjuvant therapy.  She continues to do well and so far there has been no evidence of recurrence.  CEA level is pending.  Follow-up in 1 year.   Eileen Stanford, NP 5/3/202411:49 AM

## 2022-06-20 ENCOUNTER — Encounter: Payer: Self-pay | Admitting: Family

## 2022-06-20 ENCOUNTER — Ambulatory Visit (INDEPENDENT_AMBULATORY_CARE_PROVIDER_SITE_OTHER): Payer: Medicare Other | Admitting: Family

## 2022-06-20 ENCOUNTER — Other Ambulatory Visit: Payer: Self-pay | Admitting: Family

## 2022-06-20 VITALS — BP 133/71 | HR 99 | Temp 98.0°F | Ht 61.0 in | Wt 143.6 lb

## 2022-06-20 DIAGNOSIS — K219 Gastro-esophageal reflux disease without esophagitis: Secondary | ICD-10-CM | POA: Diagnosis not present

## 2022-06-20 DIAGNOSIS — E1169 Type 2 diabetes mellitus with other specified complication: Secondary | ICD-10-CM

## 2022-06-20 DIAGNOSIS — Z794 Long term (current) use of insulin: Secondary | ICD-10-CM

## 2022-06-20 DIAGNOSIS — F411 Generalized anxiety disorder: Secondary | ICD-10-CM | POA: Diagnosis not present

## 2022-06-20 DIAGNOSIS — M2550 Pain in unspecified joint: Secondary | ICD-10-CM

## 2022-06-20 DIAGNOSIS — M19049 Primary osteoarthritis, unspecified hand: Secondary | ICD-10-CM | POA: Diagnosis not present

## 2022-06-20 DIAGNOSIS — D500 Iron deficiency anemia secondary to blood loss (chronic): Secondary | ICD-10-CM

## 2022-06-20 DIAGNOSIS — I152 Hypertension secondary to endocrine disorders: Secondary | ICD-10-CM

## 2022-06-20 DIAGNOSIS — J4530 Mild persistent asthma, uncomplicated: Secondary | ICD-10-CM

## 2022-06-20 DIAGNOSIS — E1165 Type 2 diabetes mellitus with hyperglycemia: Secondary | ICD-10-CM

## 2022-06-20 DIAGNOSIS — K760 Fatty (change of) liver, not elsewhere classified: Secondary | ICD-10-CM

## 2022-06-20 DIAGNOSIS — E785 Hyperlipidemia, unspecified: Secondary | ICD-10-CM

## 2022-06-20 DIAGNOSIS — E1159 Type 2 diabetes mellitus with other circulatory complications: Secondary | ICD-10-CM

## 2022-06-20 DIAGNOSIS — Z85038 Personal history of other malignant neoplasm of large intestine: Secondary | ICD-10-CM

## 2022-06-20 LAB — BAYER DCA HB A1C WAIVED: HB A1C (BAYER DCA - WAIVED): 7.9 % — ABNORMAL HIGH (ref 4.8–5.6)

## 2022-06-20 MED ORDER — DAPAGLIFLOZIN PROPANEDIOL 5 MG PO TABS: 5.0000 mg | ORAL_TABLET | Freq: Every day | ORAL | 1 refills | Status: DC

## 2022-06-20 NOTE — Patient Instructions (Signed)

## 2022-06-20 NOTE — Progress Notes (Signed)
Subjective:    Patient ID: Stephanie Carroll, female    DOB: [birth year], 67 y.o.   MRN: [id number]  Chief Complaint  Patient presents with   Medical Management of Chronic Issues   PT presents to the office today for chronic follow up. She is followed by Endocrinologists every 6 months for diabetes.   She is followed by asthma and allergen specialists annually.  Followed by GI annually for GERD and fatty liver. Has hx of colon cancer and has colonoscopy every 3 years.   She is followed by Oncologists every 6 months for hx colon cancer.  Diabetes She presents for her follow-up diabetic visit. She has type 2 diabetes mellitus. Hypoglycemia symptoms include nervousness/anxiousness. Pertinent negatives for diabetes include no blurred vision and no foot paresthesias. Symptoms are stable. Pertinent negatives for diabetic complications include no PVD. Risk factors for coronary artery disease include dyslipidemia, diabetes mellitus, hypertension and sedentary lifestyle. She is following a generally healthy diet. Her overall blood glucose range is 130-140 mg/dl. Eye exam is current.  Hypertension This is a chronic problem. The current episode started more than 1 year ago. The problem has been waxing and waning since onset. The problem is uncontrolled. Associated symptoms include anxiety and malaise/fatigue. Pertinent negatives include no blurred vision, peripheral edema or shortness of breath. Risk factors for coronary artery disease include dyslipidemia, diabetes mellitus, obesity and sedentary lifestyle. The current treatment provides moderate improvement. There is no history of PVD.  Asthma There is no cough, shortness of breath or wheezing. This is a chronic problem. The current episode started more than 1 year ago. The problem occurs intermittently. Associated symptoms include heartburn and malaise/fatigue. Her symptoms are alleviated by rest. She reports moderate improvement on treatment. Her past  medical history is significant for asthma.  Gastroesophageal Reflux She complains of belching and heartburn. She reports no coughing or no wheezing. This is a chronic problem. The current episode started more than 1 year ago. The problem occurs occasionally. She has tried a PPI for the symptoms. The treatment provided moderate relief.  Hyperlipidemia This is a chronic problem. The current episode started more than 1 year ago. The problem is controlled. Recent lipid tests were reviewed and are normal. Exacerbating diseases include obesity. Pertinent negatives include no shortness of breath. Current antihyperlipidemic treatment includes statins. The current treatment provides moderate improvement of lipids. Risk factors for coronary artery disease include dyslipidemia, diabetes mellitus and hypertension.  Arthritis Presents for follow-up visit. She complains of pain and stiffness. Affected locations include the left MCP and right MCP. Her pain is at a severity of 7/10.  Anemia Presents for follow-up visit. Symptoms include malaise/fatigue.  Anxiety Presents for follow-up visit. Symptoms include excessive worry, nervous/anxious behavior and restlessness. Patient reports no shortness of breath. Symptoms occur occasionally. The severity of symptoms is moderate.   Her past medical history is significant for anemia and asthma.      Review of Systems  Constitutional:  Positive for malaise/fatigue.  Eyes:  Negative for blurred vision.  Respiratory:  Negative for cough, shortness of breath and wheezing.   Gastrointestinal:  Positive for heartburn.  Musculoskeletal:  Positive for arthritis and stiffness.  Psychiatric/Behavioral:  The patient is nervous/anxious.   All other systems reviewed and are negative.      Objective:   Physical Exam Vitals reviewed.  Constitutional:      General: She is not in acute distress.    Appearance: She is well-developed. She is obese.  HENT:  Head:  Normocephalic and atraumatic.     Right Ear: Tympanic membrane normal.     Left Ear: Tympanic membrane normal.  Eyes:     Pupils: Pupils are equal, round, and reactive to light.  Neck:     Thyroid: No thyromegaly.  Cardiovascular:     Rate and Rhythm: Normal rate and regular rhythm.     Heart sounds: Normal heart sounds. No murmur heard. Pulmonary:     Effort: Pulmonary effort is normal. No respiratory distress.     Breath sounds: Normal breath sounds. No wheezing.  Abdominal:     General: Bowel sounds are normal. There is no distension.     Palpations: Abdomen is soft.     Tenderness: There is no abdominal tenderness.  Musculoskeletal:        General: No tenderness. Normal range of motion.     Cervical back: Normal range of motion and neck supple.  Skin:    General: Skin is warm and dry.  Neurological:     Mental Status: She is alert and oriented to person, place, and time.     Cranial Nerves: No cranial nerve deficit.     Deep Tendon Reflexes: Reflexes are normal and symmetric.  Psychiatric:        Behavior: Behavior normal.        Thought Content: Thought content normal.        Judgment: Judgment normal.       BP 133/71   Pulse 99   Temp 98 F (36.7 C) (Temporal)   Ht 5\' 1"  (1.549 m)   Wt 143 lb 9.6 oz (65.1 kg)   SpO2 97%   BMI 27.13 kg/m      Assessment & Plan:  Stephanie Carroll comes in today with chief complaint of Medical Management of Chronic Issues   Diagnosis and orders addressed:  1. Chronic asthma, mild persistent, uncomplicated  2. GAD (generalized anxiety disorder)  3. Gastro-esophageal reflux disease without esophagitis  4. Hand arthritis   5. History of malignant neoplasm of large intestine  6. Hyperlipidemia associated with type 2 diabetes mellitus (HCC)  7. Hypertension associated with type 2 diabetes mellitus (HCC)   8. Iron deficiency anemia due to chronic blood loss  9. Multiple joint pain  10. Non-alcoholic fatty liver  disease   11. Type 2 diabetes mellitus with hyperglycemia, with long-term current use of insulin (HCC) - Bayer DCA Hb A1c Waived   Labs pending Continue all medications  Keep follow up with all specialists  Health Maintenance reviewed Diet and exercise encouraged  Follow up plan: 4 months    Jannifer Rodney, FNP

## 2022-06-21 ENCOUNTER — Other Ambulatory Visit: Payer: Self-pay | Admitting: Family

## 2022-06-27 NOTE — Telephone Encounter (Signed)
Inbound call from patient stating her insurance needs to be contacted in regards to Rabeprazole medication. Inquiring if the patient needs to be taking medication in order for insurance to cover.  [phone]  Please advise.

## 2022-06-28 NOTE — Telephone Encounter (Signed)
This is for a prior authorization request? This should be sent to the Rx prior authorization team. Not sure why this was attached to a note from a month ago

## 2022-06-30 ENCOUNTER — Telehealth: Payer: Self-pay | Admitting: Pharmacy Technician

## 2022-06-30 ENCOUNTER — Encounter: Payer: Self-pay | Admitting: Hematology & Oncology

## 2022-06-30 ENCOUNTER — Other Ambulatory Visit (HOSPITAL_COMMUNITY): Payer: Self-pay

## 2022-06-30 NOTE — Telephone Encounter (Signed)
Patient Advocate Encounter  Received notification from [facility] that prior authorization for RABEPRAZOLE 20MG  is required.   PA submitted on 5.16.24 Key [id number] Status is pending

## 2022-06-30 NOTE — Telephone Encounter (Signed)
PA has been submitted EXPEDITED, and telephone encounter has been created.  

## 2022-07-01 ENCOUNTER — Other Ambulatory Visit (HOSPITAL_COMMUNITY): Payer: Self-pay

## 2022-07-01 NOTE — Telephone Encounter (Signed)
Patient Advocate Encounter  Prior Authorization for RABEPRAZOLE 20 has been approved with [employer].    PA# [id number] Effective dates: 5.17.24 through 5.17.25  Per [facility] test claim, already filled at pharmacy on 5.8.24

## 2022-07-15 ENCOUNTER — Encounter: Payer: Self-pay | Admitting: Hematology & Oncology

## 2022-07-15 ENCOUNTER — Telehealth: Payer: Self-pay | Admitting: Family

## 2022-07-19 ENCOUNTER — Telehealth: Payer: Self-pay | Admitting: Family

## 2022-07-20 ENCOUNTER — Other Ambulatory Visit: Payer: Self-pay | Admitting: Internal Medicine

## 2022-07-20 DIAGNOSIS — E1165 Type 2 diabetes mellitus with hyperglycemia: Secondary | ICD-10-CM

## 2022-07-21 ENCOUNTER — Other Ambulatory Visit: Payer: Self-pay

## 2022-07-21 MED ORDER — LANTUS SOLOSTAR 100 UNIT/ML ~~LOC~~ SOPN: 60.0000 [IU] | PEN_INJECTOR | Freq: Every day | SUBCUTANEOUS | 0 refills | Status: DC

## 2022-07-26 ENCOUNTER — Other Ambulatory Visit: Payer: Self-pay | Admitting: Physician Assistant

## 2022-07-27 ENCOUNTER — Other Ambulatory Visit: Payer: Self-pay | Admitting: Internal Medicine

## 2022-08-11 ENCOUNTER — Telehealth: Payer: Self-pay | Admitting: Family

## 2022-08-17 DIAGNOSIS — W0110XA Fall on same level from slipping, tripping and stumbling with subsequent striking against unspecified object, initial encounter: Secondary | ICD-10-CM | POA: Diagnosis not present

## 2022-08-17 DIAGNOSIS — Z043 Encounter for examination and observation following other accident: Secondary | ICD-10-CM | POA: Diagnosis not present

## 2022-08-17 DIAGNOSIS — R079 Chest pain, unspecified: Secondary | ICD-10-CM | POA: Diagnosis not present

## 2022-08-17 DIAGNOSIS — I10 Essential (primary) hypertension: Secondary | ICD-10-CM | POA: Diagnosis not present

## 2022-08-17 DIAGNOSIS — R0789 Other chest pain: Secondary | ICD-10-CM | POA: Diagnosis not present

## 2022-08-17 DIAGNOSIS — R000 Tachycardia, unspecified: Secondary | ICD-10-CM | POA: Diagnosis not present

## 2022-08-19 ENCOUNTER — Other Ambulatory Visit: Payer: Self-pay

## 2022-08-19 MED ORDER — METFORMIN HCL 500 MG PO TABS: 1000.0000 mg | ORAL_TABLET | Freq: Two times a day (BID) | ORAL | 0 refills | Status: DC

## 2022-08-24 ENCOUNTER — Ambulatory Visit: Payer: Medicare Other | Admitting: Internal Medicine

## 2022-08-24 ENCOUNTER — Encounter: Payer: Self-pay | Admitting: Internal Medicine

## 2022-08-24 ENCOUNTER — Encounter: Payer: Self-pay | Admitting: Hematology & Oncology

## 2022-08-24 ENCOUNTER — Encounter: Payer: Self-pay | Admitting: Nutrition

## 2022-08-24 ENCOUNTER — Encounter: Payer: Medicare Other | Attending: Family | Admitting: Nutrition

## 2022-08-24 VITALS — BP 116/70 | HR 94 | Ht 61.0 in | Wt 144.2 lb

## 2022-08-24 VITALS — Ht 61.0 in | Wt 144.0 lb

## 2022-08-24 DIAGNOSIS — I152 Hypertension secondary to endocrine disorders: Secondary | ICD-10-CM | POA: Diagnosis not present

## 2022-08-24 DIAGNOSIS — E1169 Type 2 diabetes mellitus with other specified complication: Secondary | ICD-10-CM | POA: Insufficient documentation

## 2022-08-24 DIAGNOSIS — E559 Vitamin D deficiency, unspecified: Secondary | ICD-10-CM | POA: Insufficient documentation

## 2022-08-24 DIAGNOSIS — K760 Fatty (change of) liver, not elsewhere classified: Secondary | ICD-10-CM | POA: Diagnosis not present

## 2022-08-24 DIAGNOSIS — J4530 Mild persistent asthma, uncomplicated: Secondary | ICD-10-CM

## 2022-08-24 DIAGNOSIS — E119 Type 2 diabetes mellitus without complications: Secondary | ICD-10-CM | POA: Diagnosis not present

## 2022-08-24 DIAGNOSIS — E538 Deficiency of other specified B group vitamins: Secondary | ICD-10-CM | POA: Insufficient documentation

## 2022-08-24 DIAGNOSIS — E1159 Type 2 diabetes mellitus with other circulatory complications: Secondary | ICD-10-CM | POA: Insufficient documentation

## 2022-08-24 DIAGNOSIS — Z794 Long term (current) use of insulin: Secondary | ICD-10-CM | POA: Diagnosis not present

## 2022-08-24 DIAGNOSIS — E785 Hyperlipidemia, unspecified: Secondary | ICD-10-CM | POA: Diagnosis not present

## 2022-08-24 MED ORDER — ASMANEX HFA 200 MCG/ACT IN AERO: 1.0000 | INHALATION_SPRAY | Freq: Two times a day (BID) | RESPIRATORY_TRACT | 11 refills | Status: DC

## 2022-08-24 NOTE — Progress Notes (Signed)
Medical Nutrition Therapy  Appointment Start time:  1030  Appointment End time:  1100  Primary concerns today: Dm Type 2 Referral diagnosis: E11.8 Preferred learning style: No preference  Learning readiness: Ready   NUTRITION ASSESSMENT  DM Follow up  FBS: 100-130's.  Usually 200's at night-- may not be waiting the full 2 hours. Has cut out snacks. Eating more whole plant based foods. Loving broccoli, Has been walking some but had a fall and bruised her right side. She has to care for her husband and her aging parents.  Lantus 60 units, Metformin 1000 mg BID, no longer on Actos.. Sees Dr. Lonzo Cloud in August.     Latest Ref Rng & Units 06/17/2022   10:27 AM 03/25/2022   10:17 AM 03/19/2022   11:15 AM  CMP  Glucose 70 - 99 mg/dL 409  811  914   BUN 8 - 23 mg/dL 15  12  12    Creatinine 0.44 - 1.00 mg/dL 7.82  9.56  2.13   Sodium 135 - 145 mmol/L 139  138  138   Potassium 3.5 - 5.1 mmol/L 4.4  4.7  3.5   Chloride 98 - 111 mmol/L 99  97  102   CO2 22 - 32 mmol/L 28  20  22    Calcium 8.9 - 10.3 mg/dL 08.6  57.8  9.2   Total Protein 6.5 - 8.1 g/dL 8.2  7.2  7.7   Total Bilirubin 0.3 - 1.2 mg/dL 0.4  0.4  0.6   Alkaline Phos 38 - 126 U/L 88  82  69   AST 15 - 41 U/L 37  50  56   ALT 0 - 44 U/L 44  65  66    Lipid Panel  Lab Results  Component Value Date   HGBA1C 7.9 (H) 06/20/2022    FBS's 120-130's   Night times; 190-230's had been on steriods due to some breathing issues. She has been checking it an hour after dinner instead of 2 hours. Changes made: has increasing eating more vegetables, grilled chicken, Sees Dr. Lonzo Cloud,  Lantus 60 units, Metformin 1000 mg BID, no longer on Actos..  Scheduled to see oncologist and PCP in the next few weeks. Last A1C was after she had been on steriod injections.  Anthropometrics  Wt Readings from Last 3 Encounters:  08/24/22 144 lb 3.2 oz (65.4 kg)  06/20/22 143 lb 9.6 oz (65.1 kg)  06/17/22 141 lb (64 kg)   Ht Readings from Last  3 Encounters:  08/24/22 5\' 1"  (1.549 m)  06/20/22 5\' 1"  (1.549 m)  06/17/22 5\' 1"  (1.549 m)   There is no height or weight on file to calculate BMI. @BMIFA @ Facility age limit for growth %iles is 20 years. Facility age limit for growth %iles is 20 years.   Lipid Panel     Component Value Date/Time   CHOL 208 (H) 10/19/2021 1151   TRIG 276 (H) 10/19/2021 1151   HDL 35 (L) 10/19/2021 1151   CHOLHDL 5.9 (H) 10/19/2021 1151   CHOLHDL 6 03/08/2019 1603   VLDL 59.8 (H) 03/08/2019 1603   LDLCALC 124 (H) 10/19/2021 1151   LDLDIRECT 142.0 03/08/2019 1603   LABVLDL 49 (H) 10/19/2021 1151      Latest Ref Rng & Units 06/17/2022   10:27 AM 03/25/2022   10:17 AM 03/19/2022   11:15 AM  CMP  Glucose 70 - 99 mg/dL 469  629  528   BUN 8 - 23 mg/dL 15  12  12   Creatinine 0.44 - 1.00 mg/dL 1.61  0.96  0.45   Sodium 135 - 145 mmol/L 139  138  138   Potassium 3.5 - 5.1 mmol/L 4.4  4.7  3.5   Chloride 98 - 111 mmol/L 99  97  102   CO2 22 - 32 mmol/L 28  20  22    Calcium 8.9 - 10.3 mg/dL 40.9  81.1  9.2   Total Protein 6.5 - 8.1 g/dL 8.2  7.2  7.7   Total Bilirubin 0.3 - 1.2 mg/dL 0.4  0.4  0.6   Alkaline Phos 38 - 126 U/L 88  82  69   AST 15 - 41 U/L 37  50  56   ALT 0 - 44 U/L 44  65  66     Clinical Medical Hx: See chart Medications: Lantus 66 units. Labs:  Lab Results  Component Value Date   HGBA1C 7.9 (H) 06/20/2022      Latest Ref Rng & Units 06/17/2022   10:27 AM 03/25/2022   10:17 AM 03/19/2022   11:15 AM  CMP  Glucose 70 - 99 mg/dL 914  782  956   BUN 8 - 23 mg/dL 15  12  12    Creatinine 0.44 - 1.00 mg/dL 2.13  0.86  5.78   Sodium 135 - 145 mmol/L 139  138  138   Potassium 3.5 - 5.1 mmol/L 4.4  4.7  3.5   Chloride 98 - 111 mmol/L 99  97  102   CO2 22 - 32 mmol/L 28  20  22    Calcium 8.9 - 10.3 mg/dL 46.9  62.9  9.2   Total Protein 6.5 - 8.1 g/dL 8.2  7.2  7.7   Total Bilirubin 0.3 - 1.2 mg/dL 0.4  0.4  0.6   Alkaline Phos 38 - 126 U/L 88  82  69   AST 15 - 41 U/L 37  50  56    ALT 0 - 44 U/L 44  65  66    Lipid Panel     Component Value Date/Time   CHOL 208 (H) 10/19/2021 1151   TRIG 276 (H) 10/19/2021 1151   HDL 35 (L) 10/19/2021 1151   CHOLHDL 5.9 (H) 10/19/2021 1151   CHOLHDL 6 03/08/2019 1603   VLDL 59.8 (H) 03/08/2019 1603   LDLCALC 124 (H) 10/19/2021 1151   LDLDIRECT 142.0 03/08/2019 1603   LABVLDL 49 (H) 10/19/2021 1151    Notable Signs/Symptoms: none  Lifestyle & Dietary Hx Lives with her husband. Eats 3 meals per day usually.  Estimated daily fluid intake: 40 oz Supplements: Elderberry, Vit D, Plexus supplement Sleep: good Stress / self-care:  Current average weekly physical activity: walks  24-Hr Dietary Recall Egg,  and oatmeal with bluberries, water Lunch: [place] sandwich on whole wheat,  water, fruit DInner:  squash and onions, boiled potatoes,  cantaloupe, tomatoes, water    Calories: 1200 Carbohydrate: 135g Protein: 90g Fat: 33g   NUTRITION DIAGNOSIS  NB-1.1 Food and nutrition-related knowledge deficit As related to Diabetes Type 2.  As evidenced by A1C 7.9%..   NUTRITION INTERVENTION  Nutrition education (E-1) on the following topics:  Nutrition and Diabetes education provided on My Plate, CHO counting, meal planning, portion sizes, timing of meals, avoiding snacks between meals unless having a low blood sugar, target ranges for A1C and blood sugars, signs/symptoms and treatment of hyper/hypoglycemia, monitoring blood sugars, taking medications as prescribed, benefits of exercising 30 minutes per  day and prevention of complications of DM. Lifestyle Medicine  - Whole Food, Plant Predominant Nutrition is highly recommended: Eat Plenty of vegetables, Mushrooms, fruits, Legumes, Whole Grains, Nuts, seeds in lieu of processed meats, processed snacks/pastries red meat, poultry, eggs.    -It is better to avoid simple carbohydrates including: Cakes, Sweet Desserts, Ice Cream, Soda (diet and regular), Sweet Tea, Candies, Chips,  Cookies, Store Bought Juices, Alcohol in Excess of  1-2 drinks a day, Lemonade,  Artificial Sweeteners, Doughnuts, Coffee Creamers, "Sugar-free" Products, etc, etc.  This is not a complete list.....  Exercise: If you are able: 30 -60 minutes a day ,4 days a week, or 150 minutes a week.  The longer the better.  Combine stretch, strength, and aerobic activities.  If you were told in the past that you have high risk for cardiovascular diseases, you may seek evaluation by your heart doctor prior to initiating moderate to intense exercise programs.   Handouts Provided Include  Lifestyle medicine handouts Hypoglycemia handout  Learning Style & Readiness for Change Teaching method utilized: Visual & Auditory  Demonstrated degree of understanding via: Teach Back  Barriers to learning/adherence to lifestyle change: none  Goals Established by Pt  Keep up the great job!!!! Keep eating whole plant based foods.   MONITORING & EVALUATION Dietary intake, weekly physical activity, and blood sugars in 1 month.  Next Steps  Patient is to work on meal prepping and planning.

## 2022-08-24 NOTE — Patient Instructions (Addendum)
Plan A = Automatic = Always=    Asmanex 200 one puff twice daily - if worse immediately  increase to 2 twice daily until back to normal for a week and not needing any albuterol then taper back to one twice daily   Plan B = Backup (to supplement plan A, not to replace it) Only use your albuterol inhaler as a rescue medication to be used if you can't catch your breath by resting or doing a relaxed purse lip breathing pattern.  - The less you use it, the better it will work when you need it. - Ok to use the inhaler up to 2 puffs  every 4 hours if you must but call for appointment if use goes up over your usual need - Don't leave home without it !!  (think of it like the spare tire for your car)   Plan C = Crisis (instead of Plan B but only if Plan B stops working) - only use your levoalbuterol nebulizer if you first try Plan B and it fails to help > ok to use the nebulizer up to every 4 hours but if start needing it regularly call for immediate appointment   Please schedule a follow up visit in 12 months but call sooner if needed

## 2022-08-24 NOTE — Progress Notes (Addendum)
Stephanie Carroll, female    DOB: 06-01-1955,    MRN: [id number]   Brief patient profile:  68  yowf never smoker retired from [employer] doing [employer] x decades referred to pulmonary clinic in [place]  08/25/2020 by Stephanie Carroll for asthma first developed as a child but no memory of it and recurred in her 30s requiring freq pred but since started on asmanex 200 one bid feels it's been better controlled and main issue is access thru her insurance     History of Present Illness  08/25/2020  Pulmonary/ 1st Carroll eval/ Stephanie Carroll / Stephanie Carroll  Chief Complaint  Patient presents with   Pulmonary Consult    Referred by Stephanie Reedy, Carroll for eval of Asthma. Pt states she was dx with Asthma in the 1990's. She states doing well currently, and has not had any symptoms in the past 10-15 years since started taking Asmanex.   Dyspnea:  Not limited by breathing from desired activities  / walks 30 min daily in house more limited by feet than breathing Cough: none Sleep: bed flat / sev big pillows  SABA use: on exp to smoke / has levoalbuterol very rarely needs  Rec Plan A = Automatic = Always=    Asmanex 200 1-2 first thing in am and repeat this in pm  (if worse symptoms use the full dose of 2 puffs every 12 hours for a full week)  We will send rx for new spacer to your pharmacy  Plan B = Backup (to supplement plan A, not to replace it) Only use your albuterol inhaler as a rescue medication Plan C = Crisis (instead of Plan B but only if Plan B stops working)  - only use your albuterol nebulizer if you first try Plan B and it fails to help  Please schedule a follow up visit in 12 months but call sooner if needed  with all medications /inhalers/ solutions in hand    2nd Covid Jan 2023 rx with paxlovid but not admitted rx prednisone/increased saba    08/20/2021  f/u ov/Stephanie Carroll/Stephanie Carroll re: asthma  maint on asmanex 200 1-2 bid   Chief Complaint  Patient presents with   Follow-up    Feels asthma  is controlled. Would like refills on rescue inhaler.   Dyspnea:  baseline denies sob but not exercising yet Cough: none  Sleeping: no resp cc wedge under mattress/ 2 pillow  SABA use: none now 02: none  Covid status: never vax  Rec No change in medications - work back toward 30 min of exercise at a minimum 3 weekly      08/24/2022  Yearly f/u f/u ov/ Carroll/Stephanie Carroll re: asthma maint on asmanex  200 one bid  Chief Complaint  Patient presents with   Follow-up    Breathing is overall doing well. She uses her albuterol inhaler once per wk on average and has not used neb since last visit.   Dyspnea:  inside house walking x 30 min  Cough: none  Sleeping: flat bed, couple of big pillows  SABA use: rarely  02: none  Covid status: still never vax, infected x 3      No obvious day to day or daytime variability or assoc excess/ purulent sputum or mucus plugs or hemoptysis or cp or chest tightness, subjective wheeze or overt sinus or hb symptoms.   sleeping without nocturnal  or early am exacerbation  of respiratory  c/o's or need for noct saba. Also denies  any obvious fluctuation of symptoms with weather or environmental changes or other aggravating or alleviating factors except as outlined above   No unusual exposure hx or h/o childhood pna  or knowledge of premature birth.  Current Allergies, Complete Past Medical History, Past Surgical History, Family History, and Social History were reviewed in [employer] record.  ROS  The following are not active complaints unless bolded Hoarseness, sore throat, dysphagia, dental problems, itching, sneezing,  nasal congestion or discharge of excess mucus or purulent secretions, ear ache,   fever, chills, sweats, unintended wt loss or wt gain, classically pleuritic or exertional cp,  orthopnea pnd or arm/hand swelling  or leg swelling, presyncope, palpitations, abdominal pain, anorexia, nausea, vomiting, diarrhea  or change in  bowel habits or change in bladder habits, change in stools or change in urine, dysuria, hematuria,  rash, arthralgias/mostly hands, visual complaints, headache, numbness, weakness or ataxia or problems with walking or coordination,  change in mood or  memory.        Current Meds  Medication Sig   albuterol (VENTOLIN HFA) 108 (90 Base) MCG/ACT inhaler Inhale 1-2 puffs into the lungs every 4 (four) hours as needed. For shortness for breath   BLACK ELDERBERRY PO Take 1 tablet by mouth daily.   Cetirizine HCl 10 MG CAPS Take 10 mg by mouth as needed.   cholecalciferol (VITAMIN D) 25 MCG (1000 UT) tablet Take 1,000 Units by mouth daily.   diclofenac Sodium (VOLTAREN) 1 % GEL APPLY 2 GRAMS TO AFFECTED AREA 4 TIMES A DAY   EPINEPHrine 0.3 mg/0.3 mL IJ SOAJ injection as needed.   insulin degludec (TRESIBA FLEXTOUCH) 200 UNIT/ML FlexTouch Pen Inject 60 Units into the skin daily in the afternoon.   insulin glargine (LANTUS SOLOSTAR) 100 UNIT/ML Solostar Pen INJECT 60 UNITS INTO THE SKIN DAILY   Insulin Pen Needle 32G X 4 MM MISC 1 Device by Does not apply route daily in the afternoon.   levalbuterol (XOPENEX) 1.25 MG/3ML nebulizer solution Take 1.25 mg by nebulization as needed for wheezing.   metFORMIN (GLUCOPHAGE) 500 MG tablet Take 2 tablets (1,000 mg total) by mouth 2 (two) times daily.   Mometasone Furoate (ASMANEX HFA) 200 MCG/ACT AERO Inhale 1 puff into the lungs in the morning and at bedtime.   NON FORMULARY Plexus Probio 5 dietary supplement   NON FORMULARY Plexus Slim Hunger Control weight management series   ondansetron (ZOFRAN) 4 MG tablet TAKE 1 TABLET BY MOUTH EVERY 8 HOURS AS NEEDED FOR NAUSEA AND VOMITING   RABEprazole (ACIPHEX) 20 MG tablet TAKE 1 TABLET BY MOUTH TWICE A DAY   Spacer/Aero-Holding Chambers (AEROCHAMBER PLUS WITH MASK) inhaler Use as directed                    .  Past Medical History:  Diagnosis Date   Allergy    Arthritis    DDD   Asthma    Colon cancer  (HCC) 2008   Diabetes mellitus    Elevated liver enzymes    GERD (gastroesophageal reflux disease)    Hyperlipidemia    Thyroid tumor, benign        Objective:    Wts  08/24/2022       144   08/20/21 143 lb 12.8 oz (65.2 kg)  08/09/21 145 lb (65.8 kg)  07/26/21 143 lb (64.9 kg)    Vital signs reviewed  08/24/2022  - Note at rest 02 sats  97% on RA  General appearance:    pleasant amb wf nad   HEENT : Oropharynx  clear       NECK :  without  apparent JVD/ palpable Nodes/TM    LUNGS: no acc muscle use,  Nl contour chest which is clear to A and P bilaterally without cough on insp or exp maneuvers   CV:  RRR  no s3 or murmur or increase in P2, and no edema   ABD:  soft and nontender with nl inspiratory excursion in the supine position. No bruits or organomegaly appreciated   MS:  Nl gait/ ext warm without deformities Or obvious joint restrictions  calf tenderness, cyanosis or clubbing    SKIN: warm and dry without lesions    NEURO:  alert, approp, nl sensorium with  no motor or cerebellar deficits apparent.        I personally reviewed images and   impression as follows:  CXR:   02/27/22 Wnl       Assessment

## 2022-08-24 NOTE — Patient Instructions (Signed)
Keep up the great job!!!! Keep eating whole plant based foods.

## 2022-08-24 NOTE — Assessment & Plan Note (Signed)
Onset in childhood, recurred in her 30s  - allergy testing around 2000 in [place]   pine trees, grass, pollen  - 08/25/2020  After extensive coaching inhaler device,  effectiveness =   50 % with spacer (Ti too short/ poor coordination with trigger then breathe   - 08/24/2022 did not bring inhalers as req for training but doing well    All goals of chronic asthma control met including optimal function and elimination of symptoms with minimal need for rescue therapy.  Contingencies discussed in full including contacting this office immediately if not controlling the symptoms using the rule of two's.     - The proper method of use, as well as anticipated side effects, of a metered-dose inhaler were discussed and demonstrated to the patient using teach back method   F/u can be yearly with ACTION Plan reviewed for flares         Each maintenance medication was reviewed in detail including emphasizing most importantly the difference between maintenance and prns and under what circumstances the prns are to be triggered using an action plan format where appropriate.  Total time for H and P, chart review, counseling, reviewing hfa  device(s) and generating customized AVS unique to this office visit / same day charting = 23 min

## 2022-08-25 ENCOUNTER — Telehealth: Payer: Self-pay | Admitting: Internal Medicine

## 2022-08-25 NOTE — Telephone Encounter (Signed)
Called and spoke with pharmacist. She had called for clarification on patient's Asmanex RX. I advised her that per MW's instructions, she can use 2 puffs twice daily as needed for any flares and then decrease to 1 puff twice daily as her regular maintenance dose. She verbalized understanding and will process the RX.   Nothing further needed at time of call.

## 2022-08-25 NOTE — Telephone Encounter (Signed)
Pt is at pharmacy but the prescription doesn't reflect the same instructions given by the provider Medicine is Mometasone Furoate [facility] HFA) 200 MCG/ACT AERO

## 2022-09-06 DIAGNOSIS — E113293 Type 2 diabetes mellitus with mild nonproliferative diabetic retinopathy without macular edema, bilateral: Secondary | ICD-10-CM | POA: Diagnosis not present

## 2022-09-06 DIAGNOSIS — Z961 Presence of intraocular lens: Secondary | ICD-10-CM | POA: Diagnosis not present

## 2022-09-06 LAB — HM DIABETES EYE EXAM

## 2022-09-07 ENCOUNTER — Other Ambulatory Visit: Payer: Self-pay | Admitting: Family

## 2022-09-07 DIAGNOSIS — M19042 Primary osteoarthritis, left hand: Secondary | ICD-10-CM

## 2022-09-15 DIAGNOSIS — H524 Presbyopia: Secondary | ICD-10-CM | POA: Diagnosis not present

## 2022-10-06 ENCOUNTER — Other Ambulatory Visit: Payer: Self-pay | Admitting: Internal Medicine

## 2022-10-09 NOTE — Progress Notes (Unsigned)
Name: Aubrionna Holeman Christman  MRN/ DOB: [id number], 1955/02/28   Age/ Sex: 67 y.o., female    PCP: Junie Spencer, FNP   Reason for Endocrinology Evaluation: Type 2 Diabetes Mellitus     Date of Initial Endocrinology Visit: 10/29/2021    PATIENT IDENTIFIER: Ms. Aleida Schatzle Floresca is a 67 y.o. female with a past medical history of T2DM, Asthma, HTN and dyslipidemia, Hx of colon cancer. . The patient presented for initial endocrinology clinic visit on 10/29/2021 for consultative assistance with her diabetes management.    HPI: Ms. Gossage was    Diagnosed with DM 2010 Prior Medications tried/Intolerance: She was prescribed Jardiance  but did not take it.              Hemoglobin A1c ranging from   7.9% in 2023, peaking at 9.4% in 2022   She was seen by [facility] 06/2021  On her initial visit to our clinic she had an A1c of 7.9%, she was on metformin and basal insulin only, will increase basal insulin   Started pioglitazone 03/2022 with an A1c 9.3%   LEFT THYROID NODULE : She is S/P FNA of the left thyroid nodule 2.7 cm nodule in 2011 . Ultrasound showed stability in 2023  SUBJECTIVE:   During the last visit (04/01/2022): A1c 9.3%  Today (10/09/22): Mindee Creppel Delpozo  checks her blood sugars 1 times daily. The patient has not had hypoglycemic episodes since the last clinic visit Denies local neck swelling or pain  She has chronic abdominal issues due to hx of colon cancer. Has abdominal pain , chronic diarrhea and occasional nausea.  Last yeast infection was last month which is recurrent   She is schedule to see GI with a pending CT scan due to chronic nausea and abdominal pain    HOME DIABETES REGIMEN: Pioglitazone 15 mg daily  Metformin 500 mg XR , 2 tabs BID Lantus 66 units daily -she takes 62 units   Statin: yes ACE-I/ARB: no    METER DOWNLOAD SUMMARY: Did not bring    DIABETIC COMPLICATIONS: Microvascular complications:  Mild nonproliferative DR Denies: CKD Last eye exam:  Completed 08/2021- Dr. Dione Booze   Macrovascular complications:   Denies: CAD, PVD, CVA   PAST HISTORY: Past Medical History:  Past Medical History:  Diagnosis Date   Allergy    Arthritis    DDD   Asthma    Colon cancer (HCC) 2008   Diabetes mellitus    Elevated liver enzymes    GERD (gastroesophageal reflux disease)    History of thyroiditis 12/18/2007   Formatting of this note might be different from the original. Thyroiditis   Hyperlipidemia    Iron deficiency anemia due to chronic blood loss 09/17/2020   Thyroid tumor, benign    Past Surgical History:  Past Surgical History:  Procedure Laterality Date   BIOPSY THYROID     X2   CESAREAN SECTION     x 1   COLON SURGERY     FOOT FRACTURE SURGERY Left 02/2015   INCISIONAL HERNIA REPAIR     NASAL SEPTUM SURGERY     PARTIAL HYSTERECTOMY     POLYPECTOMY     UPPER GASTROINTESTINAL ENDOSCOPY     WRIST GANGLION EXCISION     left    Social History:  reports that she has never smoked. She has never used smokeless tobacco. She reports that she does not drink alcohol and does not use drugs. Family History:  Family History  Problem  Relation Age of Onset   COPD Other        both sides   Diabetes Other        both sides   Heart disease Father    Kidney failure Father    Lung cancer Paternal Uncle    Leukemia Paternal Grandfather    Cervical cancer Paternal Aunt        x 2    Heart defect Mother        MVP   Breast cancer Mother    Colon cancer Neg Hx    Esophageal cancer Neg Hx    Rectal cancer Neg Hx    Stomach cancer Neg Hx      HOME MEDICATIONS: Allergies as of 10/10/2022       Reactions   Fluticasone-salmeterol Palpitations   Morphine Palpitations, Other (See Comments)   REACTION: heart racing   Peanut Oil Other (See Comments), Cough   congested   Peanut-containing Drug Products Cough   Sulfa Antibiotics Other (See Comments)   REACTION: takes away appetite, makes her feel nervous, jittery   Other Other (See  Comments)   Oats and Green peppers---headaches   Penicillins Rash   Sulfamethoxazole Nausea Only   Sulfur Other (See Comments)   "out of her body"   Montelukast    Ciprofloxacin Anxiety   Clindamycin Rash   Cyclobenzaprine Other (See Comments)   Loracarbef Rash      Metronidazole Other (See Comments)   Moxifloxacin Hcl In Nacl Anxiety   Penicillin G Sodium Rash        Medication List        Accurate as of October 09, 2022  7:49 PM. If you have any questions, ask your nurse or doctor.          aerochamber plus with mask inhaler Use as directed   albuterol 108 (90 Base) MCG/ACT inhaler Commonly known as: VENTOLIN HFA Inhale 1-2 puffs into the lungs every 4 (four) hours as needed. For shortness for breath   Asmanex HFA 200 MCG/ACT Aero Generic drug: Mometasone Furoate Inhale 1 puff into the lungs in the morning and at bedtime.   BLACK ELDERBERRY PO Take 1 tablet by mouth daily.   Cetirizine HCl 10 MG Caps Take 10 mg by mouth as needed.   cholecalciferol 25 MCG (1000 UNIT) tablet Commonly known as: VITAMIN D3 Take 1,000 Units by mouth daily.   diclofenac Sodium 1 % Gel Commonly known as: VOLTAREN APPLY 2 GRAMS TO AFFECTED AREA 4 TIMES A DAY   EPINEPHrine 0.3 mg/0.3 mL Soaj injection Commonly known as: EPI-PEN as needed.   Insulin Pen Needle 32G X 4 MM Misc 1 Device by Does not apply route daily in the afternoon.   Lantus SoloStar 100 UNIT/ML Solostar Pen Generic drug: insulin glargine INJECT 60 UNITS INTO THE SKIN DAILY   levalbuterol 1.25 MG/3ML nebulizer solution Commonly known as: XOPENEX Take 1.25 mg by nebulization as needed for wheezing.   metFORMIN 500 MG tablet Commonly known as: GLUCOPHAGE Take 2 tablets (1,000 mg total) by mouth 2 (two) times daily.   NON FORMULARY Plexus Probio 5 dietary supplement   NON FORMULARY Plexus Slim Hunger Control weight management series   ondansetron 4 MG tablet Commonly known as: ZOFRAN TAKE 1 TABLET  BY MOUTH EVERY 8 HOURS AS NEEDED FOR NAUSEA AND VOMITING   RABEprazole 20 MG tablet Commonly known as: ACIPHEX TAKE 1 TABLET BY MOUTH TWICE A DAY   Tresiba FlexTouch 200 UNIT/ML FlexTouch Pen  Generic drug: insulin degludec Inject 60 Units into the skin daily in the afternoon.         ALLERGIES: Allergies  Allergen Reactions   Fluticasone-Salmeterol Palpitations   Morphine Palpitations and Other (See Comments)    REACTION: heart racing   Peanut Oil Other (See Comments) and Cough    congested   Peanut-Containing Drug Products Cough   Sulfa Antibiotics Other (See Comments)    REACTION: takes away appetite, makes her feel nervous, jittery   Other Other (See Comments)    Oats and Green peppers---headaches   Penicillins Rash   Sulfamethoxazole Nausea Only   Sulfur Other (See Comments)    "out of her body"   Montelukast    Ciprofloxacin Anxiety   Clindamycin Rash   Cyclobenzaprine Other (See Comments)   Loracarbef Rash        Metronidazole Other (See Comments)   Moxifloxacin Hcl In Nacl Anxiety   Penicillin G Sodium Rash     REVIEW OF SYSTEMS: A comprehensive ROS was conducted with the patient and is negative except as per HPI    OBJECTIVE:   VITAL SIGNS: There were no vitals taken for this visit.   PHYSICAL EXAM:  General: Pt appears well and is in NAD  Lungs: Clear with good BS bilat with no rales, rhonchi, or wheezes  Heart: RRR   Extremities:  Lower extremities - No pretibial edema. No lesions.  Neuro: MS is good with appropriate affect, pt is alert and Ox3     DATA REVIEWED:  Lab Results  Component Value Date   HGBA1C 7.9 (H) 06/20/2022   HGBA1C 9.3 (H) 02/21/2022   HGBA1C 7.9 (H) 10/19/2021    Latest Reference Range & Units 10/19/21 11:51  Sodium 134 - 144 mmol/L 140  Potassium 3.5 - 5.2 mmol/L 4.7  Chloride 96 - 106 mmol/L 102  CO2 20 - 29 mmol/L 22  Glucose 70 - 99 mg/dL 161 (H)  BUN 8 - 27 mg/dL 9  Creatinine 0.96 - 0.45 mg/dL 4.09   Calcium 8.7 - 81.1 mg/dL 9.8  BUN/Creatinine Ratio 12 - 28  14  eGFR >59 mL/min/1.73 98  Alkaline Phosphatase 44 - 121 IU/L 98  Albumin 3.9 - 4.9 g/dL 4.8  Albumin/Globulin Ratio 1.2 - 2.2  1.8  AST 0 - 40 IU/L 52 (H)  ALT 0 - 32 IU/L 67 (H)  Total Protein 6.0 - 8.5 g/dL 7.5  Total Bilirubin 0.0 - 1.2 mg/dL 0.4  Total CHOL/HDL Ratio 0.0 - 4.4 ratio 5.9 (H)  Cholesterol, Total 100 - 199 mg/dL 914 (H)  HDL Cholesterol >39 mg/dL 35 (L)  Triglycerides 0 - 149 mg/dL 782 (H)  VLDL Cholesterol Cal 5 - 40 mg/dL 49 (H)  LDL Chol Calc (NIH) 0 - 99 mg/dL 956 (H)    ASSESSMENT / PLAN / RECOMMENDATIONS:   1) Type 2 Diabetes Mellitus, poorly controlled, With retinopathic  complications - Most recent A1c of 9.3 %. Goal A1c < 7.0 %.     -Patient with worsening glycemic control -She has not increased her insulin, she has fear of hypoglycemia even though she has not experienced hypoglycemic episodes -The patient tends to adjust her basal insulin depending on bedtime BG, I did explain to the patient that normal fasting BG's 70-130 mg/DL  -We discussed the pharmacokinetics of basal insulin and she should not be adjusting it based on bedtime BG's, will decrease the dose if her fasting BG's <70 mg/dL, but if she is worried about this she  may contact us with persistent BG's of <100 mg/dL -Not a candidate for GLP-1 agonists due to chronic GI issues since her colon cancer treatment -Today we discussed add-on therapy again which SGLT2 inhibitors, but she is skeptical about side effects, patient agreed to start pioglitazone, cautioned against peripheral edema  MEDICATIONS:   Start pioglitazone 15 mg daily Decrease Lantus 60 units daily Continue metformin 500 mg, 2 tabs twice daily  EDUCATION / INSTRUCTIONS: BG monitoring instructions: Patient is instructed to check her blood sugars 2 times a day. Call [place] [facility] if: BG persistently < 70  I reviewed the Rule of 15 for the treatment of  hypoglycemia in detail with the patient. Literature supplied.   2) Diabetic complications:  Eye: Does  have known diabetic retinopathy.  Neuro/ Feet: Does not have known diabetic peripheral neuropathy. Renal: Patient does not at have known baseline CKD. She is  on an ACEI/ARB at present.  3) Dyslipidemia:   -LDL above goal at 124 mg/DL, she was prescribed Crestor by her PCP that she did not start -We discussed the cardiovascular benefits of statin therapy, and I have encouraged her to start this on her last visit 10/2021  Medication rosuvastatin 5 mg daily   4) left thyroid nodule:  -This was an incidental finding during  colon cancer imaging.  She is s/p benign FNA of the left 2.7 thyroid nodule with benign cytology -Repeat thyroid ultrasound 11/2021 showed a 12-year stability and no further follow-up was recommended  Follow-up in 6 months  Signed electronically by: Lyndle Herrlich, MD  [facility]  [facility] [address] [place], [place] [address] Phone: [phone] FAX: [phone]   CC: Junie Spencer, FNP [address] [place] [address] Phone: [phone]  Fax: [phone]    Return to Endocrinology clinic as below: Future Appointments  Date Time Provider Department Center  10/10/2022 10:50 AM Johnryan Sao, Konrad Dolores, MD [facility] None  10/27/2022 11:55 AM Junie Spencer, FNP [facility] None  11/02/2022  2:10 PM Napoleon Form, MD [facility]  11/22/2022 10:40 AM Junie Spencer, FNP [facility] None  12/28/2022 11:00 AM Pola Corn D, RD [facility] None  06/19/2023 10:00 AM [facility] None  06/19/2023 10:30 AM Ennever, Rose Phi, MD [facility] None

## 2022-10-10 ENCOUNTER — Ambulatory Visit: Payer: Medicare Other | Admitting: Internal Medicine

## 2022-10-10 ENCOUNTER — Encounter: Payer: Self-pay | Admitting: Internal Medicine

## 2022-10-10 VITALS — BP 126/70 | HR 100 | Ht 61.0 in | Wt 141.0 lb

## 2022-10-10 DIAGNOSIS — E113299 Type 2 diabetes mellitus with mild nonproliferative diabetic retinopathy without macular edema, unspecified eye: Secondary | ICD-10-CM

## 2022-10-10 DIAGNOSIS — E1165 Type 2 diabetes mellitus with hyperglycemia: Secondary | ICD-10-CM

## 2022-10-10 DIAGNOSIS — Z7984 Long term (current) use of oral hypoglycemic drugs: Secondary | ICD-10-CM | POA: Diagnosis not present

## 2022-10-10 DIAGNOSIS — Z794 Long term (current) use of insulin: Secondary | ICD-10-CM

## 2022-10-10 LAB — POCT GLYCOSYLATED HEMOGLOBIN (HGB A1C): Hemoglobin A1C: 7.1 % — AB (ref 4.0–5.6)

## 2022-10-10 MED ORDER — DEXCOM G7 SENSOR MISC: 1.0000 | 3 refills | Status: DC

## 2022-10-10 MED ORDER — ONETOUCH VERIO VI STRP: 1.0000 | ORAL_STRIP | Freq: Two times a day (BID) | 3 refills | Status: DC

## 2022-10-10 MED ORDER — ROSUVASTATIN CALCIUM 5 MG PO TABS: 5.0000 mg | ORAL_TABLET | Freq: Every day | ORAL | 3 refills | Status: DC

## 2022-10-10 MED ORDER — METFORMIN HCL 500 MG PO TABS: 1000.0000 mg | ORAL_TABLET | Freq: Two times a day (BID) | ORAL | 0 refills | Status: DC

## 2022-10-10 MED ORDER — INSULIN PEN NEEDLE 32G X 4 MM MISC
1.0000 | Freq: Every day | 3 refills | Status: DC
Start: 2022-10-10 — End: 2023-10-09

## 2022-10-10 MED ORDER — LANTUS SOLOSTAR 100 UNIT/ML ~~LOC~~ SOPN: 54.0000 [IU] | PEN_INJECTOR | Freq: Every day | SUBCUTANEOUS | 3 refills | Status: DC

## 2022-10-10 MED ORDER — ONETOUCH VERIO W/DEVICE KIT: 1.0000 | PACK | Freq: Every day | 0 refills | Status: DC

## 2022-10-10 NOTE — Patient Instructions (Addendum)
Start Ozempic 0.25 mg once weekly  Decrease Lantus to 54 units daily  Continue Metformin 500 mg, 2 tablets twice daily    HOW TO TREAT LOW BLOOD SUGARS (Blood sugar LESS THAN 70 MG/DL) Please follow the RULE OF 15 for the treatment of hypoglycemia treatment (when your (blood sugars are less than 70 mg/dL)   STEP 1: Take 15 grams of carbohydrates when your blood sugar is low, which includes:  3-4 GLUCOSE TABS  OR 3-4 OZ OF JUICE OR REGULAR SODA OR ONE TUBE OF GLUCOSE GEL    STEP 2: RECHECK blood sugar in 15 MINUTES STEP 3: If your blood sugar is still low at the 15 minute recheck --> then, go back to STEP 1 and treat AGAIN with another 15 grams of carbohydrates.

## 2022-10-20 ENCOUNTER — Encounter: Payer: Self-pay | Admitting: Hematology & Oncology

## 2022-10-21 ENCOUNTER — Ambulatory Visit: Payer: 59 | Admitting: Family

## 2022-10-27 ENCOUNTER — Ambulatory Visit (INDEPENDENT_AMBULATORY_CARE_PROVIDER_SITE_OTHER): Payer: Medicare Other | Admitting: Family

## 2022-10-27 ENCOUNTER — Encounter: Payer: Self-pay | Admitting: Nutrition

## 2022-10-27 ENCOUNTER — Encounter: Payer: Self-pay | Admitting: Family

## 2022-10-27 VITALS — BP 159/80 | HR 112 | Temp 97.5°F | Ht 61.0 in | Wt 142.0 lb

## 2022-10-27 DIAGNOSIS — E1159 Type 2 diabetes mellitus with other circulatory complications: Secondary | ICD-10-CM

## 2022-10-27 DIAGNOSIS — E1169 Type 2 diabetes mellitus with other specified complication: Secondary | ICD-10-CM

## 2022-10-27 DIAGNOSIS — M2550 Pain in unspecified joint: Secondary | ICD-10-CM

## 2022-10-27 DIAGNOSIS — E113299 Type 2 diabetes mellitus with mild nonproliferative diabetic retinopathy without macular edema, unspecified eye: Secondary | ICD-10-CM | POA: Diagnosis not present

## 2022-10-27 DIAGNOSIS — K219 Gastro-esophageal reflux disease without esophagitis: Secondary | ICD-10-CM

## 2022-10-27 DIAGNOSIS — J0110 Acute frontal sinusitis, unspecified: Secondary | ICD-10-CM

## 2022-10-27 DIAGNOSIS — I152 Hypertension secondary to endocrine disorders: Secondary | ICD-10-CM | POA: Diagnosis not present

## 2022-10-27 DIAGNOSIS — M19049 Primary osteoarthritis, unspecified hand: Secondary | ICD-10-CM

## 2022-10-27 DIAGNOSIS — J4530 Mild persistent asthma, uncomplicated: Secondary | ICD-10-CM

## 2022-10-27 DIAGNOSIS — E785 Hyperlipidemia, unspecified: Secondary | ICD-10-CM

## 2022-10-27 DIAGNOSIS — Z794 Long term (current) use of insulin: Secondary | ICD-10-CM

## 2022-10-27 DIAGNOSIS — F411 Generalized anxiety disorder: Secondary | ICD-10-CM

## 2022-10-27 MED ORDER — DOXYCYCLINE HYCLATE 100 MG PO TABS
100.0000 mg | ORAL_TABLET | Freq: Two times a day (BID) | ORAL | 0 refills | Status: AC
Start: 2022-10-27 — End: ?

## 2022-10-27 NOTE — Patient Instructions (Addendum)

## 2022-10-27 NOTE — Progress Notes (Signed)
Subjective:    Patient ID: [place], female    DOB: 05/02/1955, 67 y.o.   MRN: [id number]  Chief Complaint  Patient presents with   Medical Management of Chronic Issues   Eye Problem   Sinus Problem    Since Monday    PT presents to the office today for chronic follow up. She is followed by Endocrinologists every 6 months for diabetes.    She is followed by asthma and allergen specialists annually.   Followed by GI annually for GERD and fatty liver. Has hx of colon cancer and has colonoscopy every 3 years.    She is followed by Oncologists every 6 months for hx colon cancer.  Eye Problem  Pertinent negatives include no blurred vision.  Sinus Problem This is a new problem. The current episode started in the past 7 days. The problem has been waxing and waning since onset. Her pain is at a severity of 5/10. The pain is mild. Associated symptoms include congestion, ear pain, headaches, sinus pressure and sneezing. Pertinent negatives include no coughing or shortness of breath. Past treatments include acetaminophen. The treatment provided mild relief.  Hypertension This is a chronic problem. The current episode started more than 1 year ago. The problem has been waxing and waning since onset. Associated symptoms include anxiety, headaches and malaise/fatigue. Pertinent negatives include no blurred vision, peripheral edema or shortness of breath. Risk factors for coronary artery disease include diabetes mellitus, dyslipidemia and sedentary lifestyle. The current treatment provides moderate improvement.  Asthma There is no cough, shortness of breath or wheezing. This is a chronic problem. The current episode started more than 1 year ago. The problem occurs intermittently. Associated symptoms include ear pain, headaches, heartburn, malaise/fatigue and sneezing. She reports moderate improvement on treatment. Her past medical history is significant for asthma.  Gastroesophageal Reflux She  complains of belching and heartburn. She reports no coughing or no wheezing. This is a chronic problem. The current episode started more than 1 year ago. The problem occurs rarely. She has tried a diet change for the symptoms. The treatment provided moderate relief.  Diabetes She presents for her follow-up diabetic visit. She has type 2 diabetes mellitus. Hypoglycemia symptoms include headaches and nervousness/anxiousness. Pertinent negatives for diabetes include no blurred vision and no foot paresthesias. Symptoms are stable. Risk factors for coronary artery disease include dyslipidemia, diabetes mellitus, hypertension, sedentary lifestyle and post-menopausal. She is following a generally healthy diet. Her overall blood glucose range is 90-110 mg/dl.  Anxiety Presents for follow-up visit. Symptoms include excessive worry and nervous/anxious behavior. Patient reports no shortness of breath. Symptoms occur occasionally. The severity of symptoms is mild.   Her past medical history is significant for asthma.  Arthritis Presents for follow-up visit. She complains of pain and stiffness. The symptoms have been stable. Affected locations include the left MCP and right MCP.      Review of Systems  Constitutional:  Positive for malaise/fatigue.  HENT:  Positive for congestion, ear pain, sinus pressure and sneezing.   Eyes:  Negative for blurred vision.  Respiratory:  Negative for cough, shortness of breath and wheezing.   Gastrointestinal:  Positive for heartburn.  Musculoskeletal:  Positive for arthritis and stiffness.  Neurological:  Positive for headaches.  Psychiatric/Behavioral:  The patient is nervous/anxious.   All other systems reviewed and are negative.      Objective:   Physical Exam Vitals reviewed.  Constitutional:      General: She is not in acute  distress.    Appearance: She is well-developed. She is obese.  HENT:     Head: Normocephalic and atraumatic.     Right Ear: Tympanic  membrane normal.     Left Ear: Tympanic membrane normal.  Eyes:     Pupils: Pupils are equal, round, and reactive to light.  Neck:     Thyroid: No thyromegaly.  Cardiovascular:     Rate and Rhythm: Normal rate and regular rhythm.     Heart sounds: Normal heart sounds. No murmur heard. Pulmonary:     Effort: Pulmonary effort is normal. No respiratory distress.     Breath sounds: Normal breath sounds. No wheezing.  Abdominal:     General: Bowel sounds are normal. There is no distension.     Palpations: Abdomen is soft.     Tenderness: There is no abdominal tenderness.  Musculoskeletal:        General: No tenderness. Normal range of motion.     Cervical back: Normal range of motion and neck supple.  Skin:    General: Skin is warm and dry.  Neurological:     Mental Status: She is alert and oriented to person, place, and time.     Cranial Nerves: No cranial nerve deficit.     Deep Tendon Reflexes: Reflexes are normal and symmetric.  Psychiatric:        Behavior: Behavior normal.        Thought Content: Thought content normal.        Judgment: Judgment normal.       BP (!) 159/80   Pulse (!) 112   Temp (!) 97.5 F (36.4 C) (Temporal)   Ht 5\' 1"  (1.549 m)   Wt 142 lb (64.4 kg)   SpO2 99%   BMI 26.83 kg/m      Assessment & Plan:  Stephanie [place] comes in today with chief complaint of Medical Management of Chronic Issues, Eye Problem, and Sinus Problem (Since Monday )   Diagnosis and orders addressed:  1. Type 2 diabetes mellitus with mild nonproliferative retinopathy without macular edema, with long-term current use of insulin, unspecified laterality (HCC) - CMP14+EGFR  2. Multiple joint pain - CMP14+EGFR  3. Hypertension associated with type 2 diabetes mellitus (HCC) - CMP14+EGFR  4. Hyperlipidemia associated with type 2 diabetes mellitus (HCC) - CMP14+EGFR - Lipid panel  5. Hand arthritis - CMP14+EGFR  6. Gastro-esophageal reflux disease without  esophagitis - CMP14+EGFR  7. GAD (generalized anxiety disorder) - CMP14+EGFR  8. Chronic asthma, mild persistent, uncomplicated - CMP14+EGFR  9. Acute non-recurrent frontal sinusitis - doxycycline (VIBRA-TABS) 100 MG tablet; Take 1 tablet (100 mg total) by mouth 2 (two) times daily.  Dispense: 20 tablet; Refill: 0 - CMP14+EGFR   Labs pending Will give doxycyline, but will not take unless symptoms worsen or do not improve.  Keep follow up with specialists  Continue current medications  Health Maintenance reviewed Diet and exercise encouraged  Follow up plan: 4 months   Jannifer Rodney, FNP

## 2022-10-28 LAB — LIPID PANEL
Chol/HDL Ratio: 5.6 ratio — ABNORMAL HIGH (ref 0.0–4.4)
Cholesterol, Total: 206 mg/dL — ABNORMAL HIGH (ref 100–199)
HDL: 37 mg/dL — ABNORMAL LOW (ref >39)
LDL Chol Calc (NIH): 129 mg/dL — ABNORMAL HIGH (ref 0–99)
Triglycerides: 226 mg/dL — ABNORMAL HIGH (ref 0–149)
VLDL Cholesterol Cal: 40 mg/dL (ref 5–40)

## 2022-10-28 LAB — CMP14+EGFR
ALT: 56 IU/L — ABNORMAL HIGH (ref 0–32)
AST: 52 IU/L — ABNORMAL HIGH (ref 0–40)
Albumin: 4.7 g/dL (ref 3.9–4.9)
Alkaline Phosphatase: 83 IU/L (ref 44–121)
BUN/Creatinine Ratio: 16 (ref 12–28)
BUN: 12 mg/dL (ref 8–27)
Bilirubin Total: 0.4 mg/dL (ref 0.0–1.2)
CO2: 22 mmol/L (ref 20–29)
Calcium: 9.8 mg/dL (ref 8.7–10.3)
Chloride: 100 mmol/L (ref 96–106)
Creatinine, Ser: 0.73 mg/dL (ref 0.57–1.00)
Globulin, Total: 2.6 g/dL (ref 1.5–4.5)
Glucose: 120 mg/dL — ABNORMAL HIGH (ref 70–99)
Potassium: 4.7 mmol/L (ref 3.5–5.2)
Sodium: 139 mmol/L (ref 134–144)
Total Protein: 7.3 g/dL (ref 6.0–8.5)
eGFR: 91 mL/min/{1.73_m2} (ref >59)
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
## 2022-11-02 ENCOUNTER — Ambulatory Visit: Payer: Medicare Other | Admitting: Gastroenterology

## 2022-11-02 ENCOUNTER — Encounter: Payer: Self-pay | Admitting: Gastroenterology

## 2022-11-02 VITALS — BP 120/70 | HR 104 | Ht 61.0 in | Wt 144.0 lb

## 2022-11-02 DIAGNOSIS — R110 Nausea: Secondary | ICD-10-CM

## 2022-11-02 DIAGNOSIS — Z86010 Personal history of colonic polyps: Secondary | ICD-10-CM | POA: Diagnosis not present

## 2022-11-02 DIAGNOSIS — Z85038 Personal history of other malignant neoplasm of large intestine: Secondary | ICD-10-CM | POA: Diagnosis not present

## 2022-11-02 DIAGNOSIS — K760 Fatty (change of) liver, not elsewhere classified: Secondary | ICD-10-CM

## 2022-11-02 NOTE — Patient Instructions (Signed)
You will be contacted by [facility] in the next 2 days to arrange a Ultrasound Elastography .  The number on your caller ID will be [phone], please answer when they call.  If you have not heard from them in 2 days please call [phone] to schedule.    We will change your colonoscopy recall to 11/2023  We have sent the following medications to your pharmacy for you to pick up at your convenience: Zofran  Take Vitamin E 500 IU  You will need Labs in 6 months  Hepatic Function Panel  Follow up in 6 months  I appreciate the  opportunity to care for you  Thank You   Marsa Aris , MD

## 2022-11-03 ENCOUNTER — Telehealth: Payer: Self-pay | Admitting: Gastroenterology

## 2022-11-03 ENCOUNTER — Encounter: Payer: Self-pay | Admitting: Gastroenterology

## 2022-11-03 NOTE — Telephone Encounter (Signed)
Patient is advised.

## 2022-11-03 NOTE — Telephone Encounter (Signed)
Yes, it is fine to start Ozempic at the lowest dose and slowly increase as needed.  If she is experiencing worsening nausea or dyspepsia, may need to stop.  Thank

## 2022-11-03 NOTE — Telephone Encounter (Signed)
Please advise on your recommendations.Marland KitchenMarland Kitchen

## 2022-11-03 NOTE — Telephone Encounter (Signed)
Inbound call from patient, states she spoke to her diabetic doctor and wanted to try the Ozempic medication at a low dose and patient wanted to know if that was okay with Dr. Lavon Paganini. Please advise.

## 2022-11-04 ENCOUNTER — Telehealth: Payer: Self-pay

## 2022-11-04 NOTE — Telephone Encounter (Signed)
noted 

## 2022-11-04 NOTE — Telephone Encounter (Signed)
Patient states that she is having joint pain from the Rosuvastatin and can't take it anymore.

## 2022-11-07 ENCOUNTER — Other Ambulatory Visit: Payer: Self-pay

## 2022-11-07 ENCOUNTER — Telehealth: Payer: Self-pay | Admitting: Gastroenterology

## 2022-11-07 NOTE — Telephone Encounter (Signed)
Inbound call from patient states she spoke to Dr. Lavon Paganini in her previous visit and was told Zofran would be called in for her. Patient is requesting medication to be filled at [facility] in [place].

## 2022-11-08 ENCOUNTER — Ambulatory Visit (HOSPITAL_COMMUNITY): Payer: Medicare Other

## 2022-11-08 MED ORDER — ONDANSETRON HCL 4 MG PO TABS: 4.0000 mg | ORAL_TABLET | Freq: Three times a day (TID) | ORAL | 1 refills | Status: DC | PRN

## 2022-11-08 NOTE — Telephone Encounter (Signed)
[facility]fr[facility] in [place]. Patient was notified.

## 2022-11-11 ENCOUNTER — Telehealth: Payer: Self-pay | Admitting: Family

## 2022-11-11 NOTE — Telephone Encounter (Signed)
APPT MADE
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
## 2022-11-11 NOTE — Telephone Encounter (Signed)
I am sorry, this has to be prescribed in an office visit.

## 2022-11-14 ENCOUNTER — Encounter: Payer: Self-pay | Admitting: Family

## 2022-11-14 ENCOUNTER — Telehealth (INDEPENDENT_AMBULATORY_CARE_PROVIDER_SITE_OTHER): Payer: Medicare Other | Admitting: Family

## 2022-11-14 ENCOUNTER — Ambulatory Visit (HOSPITAL_COMMUNITY)
Admission: RE | Admit: 2022-11-14 | Discharge: 2022-11-14 | Disposition: A | Discharge status: Home / Self Care | Payer: Medicare Other | Source: Ambulatory Visit | Attending: Gastroenterology | Admitting: Gastroenterology

## 2022-11-14 DIAGNOSIS — F411 Generalized anxiety disorder: Secondary | ICD-10-CM

## 2022-11-14 DIAGNOSIS — R110 Nausea: Secondary | ICD-10-CM | POA: Insufficient documentation

## 2022-11-14 DIAGNOSIS — Z85038 Personal history of other malignant neoplasm of large intestine: Secondary | ICD-10-CM | POA: Diagnosis not present

## 2022-11-14 DIAGNOSIS — K760 Fatty (change of) liver, not elsewhere classified: Secondary | ICD-10-CM | POA: Diagnosis not present

## 2022-11-14 DIAGNOSIS — R932 Abnormal findings on diagnostic imaging of liver and biliary tract: Secondary | ICD-10-CM | POA: Diagnosis not present

## 2022-11-14 DIAGNOSIS — K759 Inflammatory liver disease, unspecified: Secondary | ICD-10-CM | POA: Diagnosis not present

## 2022-11-14 DIAGNOSIS — Z944 Liver transplant status: Secondary | ICD-10-CM | POA: Diagnosis not present

## 2022-11-14 MED ORDER — ALPRAZOLAM 0.25 MG PO TABS
0.2500 mg | ORAL_TABLET | Freq: Two times a day (BID) | ORAL | 0 refills | Status: DC | PRN
Start: 2022-11-14 — End: 2023-07-14
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
## 2022-11-17 ENCOUNTER — Telehealth: Payer: Self-pay | Admitting: Gastroenterology

## 2022-11-17 NOTE — Telephone Encounter (Signed)
Inbound call from patient in regards to scan results. Patient can be reached at [phone].  Please advise. Thank you

## 2022-11-17 NOTE — Telephone Encounter (Signed)
Inbound call from patient, calling to see if results from [place] have returned. Patient was advised results were not back. Patient still wished for a message to be sent to advise doctor.

## 2022-11-17 NOTE — Telephone Encounter (Signed)
Patient advised there are no results yet.

## 2022-11-18 ENCOUNTER — Other Ambulatory Visit: Payer: Self-pay | Admitting: Gastroenterology

## 2022-11-21 ENCOUNTER — Encounter: Payer: Self-pay | Admitting: Family

## 2022-11-22 ENCOUNTER — Other Ambulatory Visit: Payer: Medicare Other

## 2022-11-22 ENCOUNTER — Ambulatory Visit (INDEPENDENT_AMBULATORY_CARE_PROVIDER_SITE_OTHER): Payer: Medicare Other | Admitting: Family

## 2022-11-22 ENCOUNTER — Encounter: Payer: Self-pay | Admitting: Family

## 2022-11-22 VITALS — BP 151/75 | HR 101 | Temp 97.0°F | Ht 61.0 in | Wt 142.8 lb

## 2022-11-22 DIAGNOSIS — Z0000 Encounter for general adult medical examination without abnormal findings: Secondary | ICD-10-CM

## 2022-11-22 NOTE — Patient Instructions (Signed)
Health Maintenance After Age 65 After age 65, you are at a higher risk for certain long-term diseases and infections as well as injuries from falls. Falls are a major cause of broken bones and head injuries in people who are older than age 65. Getting regular preventive care can help to keep you healthy and well. Preventive care includes getting regular testing and making lifestyle changes as recommended by your health care provider. Talk with your health care provider about: Which screenings and tests you should have. A screening is a test that checks for a disease when you have no symptoms. A diet and exercise plan that is right for you. What should I know about screenings and tests to prevent falls? Screening and testing are the best ways to find a health problem early. Early diagnosis and treatment give you the best chance of managing medical conditions that are common after age 65. Certain conditions and lifestyle choices may make you more likely to have a fall. Your health care provider may recommend: Regular vision checks. Poor vision and conditions such as cataracts can make you more likely to have a fall. If you wear glasses, make sure to get your prescription updated if your vision changes. Medicine review. Work with your health care provider to regularly review all of the medicines you are taking, including over-the-counter medicines. Ask your health care provider about any side effects that may make you more likely to have a fall. Tell your health care provider if any medicines that you take make you feel dizzy or sleepy. Strength and balance checks. Your health care provider may recommend certain tests to check your strength and balance while standing, walking, or changing positions. Foot health exam. Foot pain and numbness, as well as not wearing proper footwear, can make you more likely to have a fall. Screenings, including: Osteoporosis screening. Osteoporosis is a condition that causes  the bones to get weaker and break more easily. Blood pressure screening. Blood pressure changes and medicines to control blood pressure can make you feel dizzy. Depression screening. You may be more likely to have a fall if you have a fear of falling, feel depressed, or feel unable to do activities that you used to do. Alcohol use screening. Using too much alcohol can affect your balance and may make you more likely to have a fall. Follow these instructions at home: Lifestyle Do not drink alcohol if: Your health care provider tells you not to drink. If you drink alcohol: Limit how much you have to: 0-1 drink a day for women. 0-2 drinks a day for men. Know how much alcohol is in your drink. In the [place] one drink equals one 12 oz bottle of beer (355 mL), one 5 oz glass of wine (148 mL), or one 1 oz glass of hard liquor (44 mL). Do not use any products that contain nicotine or tobacco. These products include cigarettes, chewing tobacco, and vaping devices, such as e-cigarettes. If you need help quitting, ask your health care provider. Activity  Follow a regular exercise program to stay fit. This will help you maintain your balance. Ask your health care provider what types of exercise are appropriate for you. If you need a cane or walker, use it as recommended by your health care provider. Wear supportive shoes that have nonskid soles. Safety  Remove any tripping hazards, such as rugs, cords, and clutter. Install safety equipment such as grab bars in bathrooms and safety rails on stairs. Keep rooms and walkways   well-lit. General instructions Talk with your health care provider about your risks for falling. Tell your health care provider if: You fall. Be sure to tell your health care provider about all falls, even ones that seem minor. You feel dizzy, tiredness (fatigue), or off-balance. Take over-the-counter and prescription medicines only as told by your health care provider. These include  supplements. Eat a healthy diet and maintain a healthy weight. A healthy diet includes low-fat dairy products, low-fat (lean) meats, and fiber from whole grains, beans, and lots of fruits and vegetables. Stay current with your vaccines. Schedule regular health, dental, and eye exams. Summary Having a healthy lifestyle and getting preventive care can help to protect your health and wellness after age 65. Screening and testing are the best way to find a health problem early and help you avoid having a fall. Early diagnosis and treatment give you the best chance for managing medical conditions that are more common for people who are older than age 65. Falls are a major cause of broken bones and head injuries in people who are older than age 65. Take precautions to prevent a fall at home. Work with your health care provider to learn what changes you can make to improve your health and wellness and to prevent falls. This information is not intended to replace advice given to you by your health care provider. Make sure you discuss any questions you have with your health care provider. Document Revised: 06/22/2020 Document Reviewed: 06/22/2020 Elsevier Patient Education  2024 Elsevier Inc.  

## 2022-11-22 NOTE — Progress Notes (Signed)
Subjective:    Stephanie Carroll is a 67 y.o. female who presents for a Welcome to Medicare exam.          Objective:    Today's Vitals   11/22/22 1051 11/22/22 1052  BP: (!) 159/67 (!) 151/75  Pulse: (!) 101   Temp: (!) 97 F (36.1 C)   TempSrc: Temporal   SpO2: 96%   Weight: 142 lb 12.8 oz (64.8 kg)   Height: 5\' 1"  (1.549 m)   Body mass index is 26.98 kg/m.  Medications Outpatient Encounter Medications as of 11/22/2022  Medication Sig   albuterol (VENTOLIN HFA) 108 (90 Base) MCG/ACT inhaler Inhale 1-2 puffs into the lungs every 4 (four) hours as needed. For shortness for breath   ALPRAZolam (XANAX) 0.25 MG tablet Take 1 tablet (0.25 mg total) by mouth 2 (two) times daily as needed for anxiety.   BLACK ELDERBERRY PO Take 1 tablet by mouth daily.   Blood Glucose Monitoring Suppl (ONETOUCH VERIO) w/Device KIT 1 Device by Does not apply route daily in the afternoon.   Cetirizine HCl 10 MG CAPS Take 10 mg by mouth as needed.   cholecalciferol (VITAMIN D) 25 MCG (1000 UT) tablet Take 1,000 Units by mouth daily.   Continuous Glucose Sensor (DEXCOM G7 SENSOR) MISC 1 Device by Does not apply route as directed.   diclofenac Sodium (VOLTAREN) 1 % GEL APPLY 2 GRAMS TO AFFECTED AREA 4 TIMES A DAY   EPINEPHrine 0.3 mg/0.3 mL IJ SOAJ injection as needed.   glucose blood (ONETOUCH VERIO) test strip 1 each by Other route 2 (two) times daily. Use as instructed   insulin glargine (LANTUS SOLOSTAR) 100 UNIT/ML Solostar Pen Inject 54 Units into the skin daily.   Insulin Pen Needle 32G X 4 MM MISC 1 Device by Does not apply route daily in the afternoon.   levalbuterol (XOPENEX) 1.25 MG/3ML nebulizer solution Take 1.25 mg by nebulization as needed for wheezing.   metFORMIN (GLUCOPHAGE) 500 MG tablet Take 2 tablets (1,000 mg total) by mouth 2 (two) times daily.   Mometasone Furoate (ASMANEX HFA) 200 MCG/ACT AERO Inhale 1 puff into the lungs in the morning and at bedtime.   NON FORMULARY Plexus Probio  5 dietary supplement   NON FORMULARY Plexus Slim Hunger Control weight management series   ondansetron (ZOFRAN) 4 MG tablet TAKE 1 TABLET BY MOUTH EVERY 8 HOURS AS NEEDED FOR NAUSEA AND VOMITING   ondansetron (ZOFRAN) 4 MG tablet Take 1 tablet (4 mg total) by mouth every 8 (eight) hours as needed for nausea or vomiting.   RABEprazole (ACIPHEX) 20 MG tablet TAKE 1 TABLET BY MOUTH TWICE A DAY   rosuvastatin (CRESTOR) 5 MG tablet Take 1 tablet (5 mg total) by mouth daily.   Spacer/Aero-Holding Chambers (AEROCHAMBER PLUS WITH MASK) inhaler Use as directed   No facility-administered encounter medications on file as of 11/22/2022.     History: Past Medical History:  Diagnosis Date   Allergy    Arthritis    DDD   Asthma    Colon cancer (HCC) 2008   Diabetes mellitus    Elevated liver enzymes    GERD (gastroesophageal reflux disease)    History of thyroiditis 12/18/2007   Formatting of this note might be different from the original. Thyroiditis   Hyperlipidemia    Iron deficiency anemia due to chronic blood loss 09/17/2020   Thyroid tumor, benign    Past Surgical History:  Procedure Laterality Date   BIOPSY THYROID  X2   CESAREAN SECTION     x 1   COLON SURGERY     FOOT FRACTURE SURGERY Left 02/2015   INCISIONAL HERNIA REPAIR     NASAL SEPTUM SURGERY     PARTIAL HYSTERECTOMY     POLYPECTOMY     UPPER GASTROINTESTINAL ENDOSCOPY     WRIST GANGLION EXCISION     left    Family History  Problem Relation Age of Onset   COPD Other        both sides   Diabetes Other        both sides   Heart disease Father    Kidney failure Father    Lung cancer Paternal Uncle    Leukemia Paternal Grandfather    Cervical cancer Paternal Aunt        x 2    Heart defect Mother        MVP   Breast cancer Mother    Colon cancer Neg Hx    Esophageal cancer Neg Hx    Rectal cancer Neg Hx    Stomach cancer Neg Hx    Social History   Occupational History   Occupation: Scientist, product/process development for [employer]     Employer: [employer]  Tobacco Use   Smoking status: Never   Smokeless tobacco: Never   Tobacco comments:    never used product  Vaping Use   Vaping status: Never Used  Substance and Sexual Activity   Alcohol use: No    Alcohol/week: 0.0 standard drinks of alcohol   Drug use: No   Sexual activity: Not on file    Tobacco Counseling Counseling given: Not Answered Tobacco comments: never used product   Immunizations and Health Maintenance Immunization History  Administered Date(s) Administered   Tdap 02/13/2007, 07/05/2021   Health Maintenance Due  Topic Date Due   Medicare Annual Wellness (AWV)  Never done   Cervical Cancer Screening (HPV/Pap Cotest)  Never done   Diabetic kidney evaluation - Urine ACR  10/20/2022    Activities of Daily Living     No data to display          Physical Exam   Physical Exam (optional), or other factors deemed appropriate based on the beneficiary's medical and social history and current clinical standards.   Advanced Directives:    EKG:  sinus tachycardia, possible left artrial enlargement       Assessment:    This is a routine wellness examination for this patient .   Vision/Hearing screen No results found.   Goals   None     Depression Screen    11/22/2022   10:44 AM 06/20/2022   10:47 AM 04/29/2022   11:56 AM 03/25/2022    9:49 AM  PHQ 2/9 Scores  PHQ - 2 Score 0 0 0 0  PHQ- 9 Score 1 0 0 0     Fall Risk    06/20/2022   10:47 AM  Fall Risk   Falls in the past year? 0  Number falls in past yr: 0  Injury with Fall? 0  Risk for fall due to : No Fall Risks  Follow up Falls evaluation completed    Cognitive Function:    11/22/2022   10:47 AM  MMSE - Mini Mental State Exam  Orientation to time 5  Orientation to Place 5  Registration 3  Attention/ Calculation 5  Recall 3  Language- name 2 objects 2  Language- repeat 1  Language- follow 3 step command  3  Language- read & follow direction 1  Write a  sentence 1  Copy design 1  Total score 30        Patient Care Team: Junie Spencer, FNP as PCP - General (Family Medicine) Dione Booze, Bertram Millard, MD as Consulting Physician (Ophthalmology) Rande Brunt, MD as Referring Physician (Obstetrics and Gynecology)     Plan:     I have personally reviewed and noted the following in the patient's chart:   Medical and social history Use of alcohol, tobacco or illicit drugs  Current medications and supplements Functional ability and status Nutritional status Physical activity Advanced directives List of other physicians Hospitalizations, surgeries, and ER visits in previous 12 months Vitals Screenings to include cognitive, depression, and falls Referrals and appointments  In addition, I have reviewed and discussed with patient certain preventive protocols, quality metrics, and best practice recommendations. A written personalized care plan for preventive services as well as general preventive health recommendations were provided to patient.     Jannifer Rodney, [place] 11/22/2022

## 2022-11-24 ENCOUNTER — Encounter: Payer: Self-pay | Admitting: Family

## 2022-11-24 ENCOUNTER — Ambulatory Visit: Payer: Medicare Other | Admitting: Family

## 2022-11-24 VITALS — BP 160/78 | HR 112 | Temp 97.0°F | Ht 61.0 in | Wt 144.6 lb

## 2022-11-24 DIAGNOSIS — R399 Unspecified symptoms and signs involving the genitourinary system: Secondary | ICD-10-CM | POA: Diagnosis not present

## 2022-11-24 DIAGNOSIS — N3001 Acute cystitis with hematuria: Secondary | ICD-10-CM | POA: Diagnosis not present

## 2022-11-24 LAB — URINALYSIS, COMPLETE
Bilirubin, UA: NEGATIVE
Glucose, UA: NEGATIVE
Ketones, UA: NEGATIVE
Nitrite, UA: NEGATIVE
Protein,UA: NEGATIVE
Specific Gravity, UA: <1.005 — ABNORMAL LOW (ref 1.005–1.030)
Urobilinogen, Ur: 0.2 mg/dL (ref 0.2–1.0)
pH, UA: 6.5 (ref 5.0–7.5)

## 2022-11-24 LAB — MICROSCOPIC EXAMINATION
RBC, Urine: NONE SEEN /[HPF] (ref 0–2)
Renal Epithel, UA: NONE SEEN /[HPF]
Yeast, UA: NONE SEEN

## 2022-11-24 MED ORDER — NITROFURANTOIN MONOHYD MACRO 100 MG PO CAPS
100.0000 mg | ORAL_CAPSULE | Freq: Two times a day (BID) | ORAL | 0 refills | Status: DC
Start: 2022-11-24 — End: 2022-11-24

## 2022-11-24 NOTE — Progress Notes (Signed)
Subjective:    Patient ID: Stephanie Carroll, female    DOB: 02-13-1956, 67 y.o.   MRN: [id number]  Chief Complaint  Patient presents with   Urinary Tract Infection   PT presents to the office with UTI symptoms that started in the middle of the night. Urinary Frequency  This is a new problem. The current episode started yesterday. The problem occurs every urination. The problem has been unchanged. The pain is at a severity of 2/10. The pain is mild. There has been no fever. Associated symptoms include frequency, hesitancy and urgency. Pertinent negatives include no flank pain, hematuria, nausea or vomiting. She has tried increased fluids for the symptoms. The treatment provided mild relief.      Review of Systems  Gastrointestinal:  Negative for nausea and vomiting.  Genitourinary:  Positive for frequency, hesitancy and urgency. Negative for flank pain and hematuria.  All other systems reviewed and are negative.      Objective:   Physical Exam Vitals reviewed.  Constitutional:      General: She is not in acute distress.    Appearance: She is well-developed.  HENT:     Head: Normocephalic and atraumatic.  Eyes:     Pupils: Pupils are equal, round, and reactive to light.  Neck:     Thyroid: No thyromegaly.  Cardiovascular:     Rate and Rhythm: Normal rate and regular rhythm.     Heart sounds: Normal heart sounds. No murmur heard. Pulmonary:     Effort: Pulmonary effort is normal. No respiratory distress.     Breath sounds: Normal breath sounds. No wheezing.  Abdominal:     General: Bowel sounds are normal. There is no distension.     Palpations: Abdomen is soft.     Tenderness: There is abdominal tenderness (mild lower).  Musculoskeletal:        General: No tenderness. Normal range of motion.     Cervical back: Normal range of motion and neck supple.  Skin:    General: Skin is warm and dry.  Neurological:     Mental Status: She is alert and oriented to person, place, and  time.     Cranial Nerves: No cranial nerve deficit.     Deep Tendon Reflexes: Reflexes are normal and symmetric.  Psychiatric:        Behavior: Behavior normal.        Thought Content: Thought content normal.        Judgment: Judgment normal.      BP (!) 171/71   Pulse (!) 112   Temp (!) 97 F (36.1 C) (Temporal)   Ht 5\' 1"  (1.549 m)   Wt 144 lb 9.6 oz (65.6 kg)   BMI 27.32 kg/m       Assessment & Plan:   Stephanie Carroll comes in today with chief complaint of Urinary Tract Infection   Diagnosis and orders addressed:  1. UTI symptoms - Urine Culture - Urinalysis, Complete  2. Acute cystitis with hematuria Force fluids AZO over the counter X2 days RTO prn Culture pending PT will start doxycyline she has at home she never started.     Jannifer Rodney, FNP

## 2022-11-24 NOTE — Patient Instructions (Signed)
Urinary Tract Infection, Adult  A urinary tract infection (UTI) is an infection of any part of the urinary tract. The urinary tract includes the kidneys, ureters, bladder, and urethra. These organs make, store, and get rid of urine in the body. An upper UTI affects the ureters and kidneys. A lower UTI affects the bladder and urethra. What are the causes? Most urinary tract infections are caused by bacteria in your genital area around your urethra, where urine leaves your body. These bacteria grow and cause inflammation of your urinary tract. What increases the risk? You are more likely to develop this condition if: You have a urinary catheter that stays in place. You are not able to control when you urinate or have a bowel movement (incontinence). You are female and you: Use a spermicide or diaphragm for birth control. Have low estrogen levels. Are pregnant. You have certain genes that increase your risk. You are sexually active. You take antibiotic medicines. You have a condition that causes your flow of urine to slow down, such as: An enlarged prostate, if you are female. Blockage in your urethra. A kidney stone. A nerve condition that affects your bladder control (neurogenic bladder). Not getting enough to drink, or not urinating often. You have certain medical conditions, such as: Diabetes. A weak disease-fighting system (immunesystem). Sickle cell disease. Gout. Spinal cord injury. What are the signs or symptoms? Symptoms of this condition include: Needing to urinate right away (urgency). Frequent urination. This may include small amounts of urine each time you urinate. Pain or burning with urination. Blood in the urine. Urine that smells bad or unusual. Trouble urinating. Cloudy urine. Vaginal discharge, if you are female. Pain in the abdomen or the lower back. You may also have: Vomiting or a decreased appetite. Confusion. Irritability or tiredness. A fever or  chills. Diarrhea. The first symptom in older adults may be confusion. In some cases, they may not have any symptoms until the infection has worsened. How is this diagnosed? This condition is diagnosed based on your medical history and a physical exam. You may also have other tests, including: Urine tests. Blood tests. Tests for STIs (sexually transmitted infections). If you have had more than one UTI, a cystoscopy or imaging studies may be done to determine the cause of the infections. How is this treated? Treatment for this condition includes: Antibiotic medicine. Over-the-counter medicines to treat discomfort. Drinking enough water to stay hydrated. If you have frequent infections or have other conditions such as a kidney stone, you may need to see a health care provider who specializes in the urinary tract (urologist). In rare cases, urinary tract infections can cause sepsis. Sepsis is a life-threatening condition that occurs when the body responds to an infection. Sepsis is treated in the hospital with IV antibiotics, fluids, and other medicines. Follow these instructions at home:  Medicines Take over-the-counter and prescription medicines only as told by your health care provider. If you were prescribed an antibiotic medicine, take it as told by your health care provider. Do not stop using the antibiotic even if you start to feel better. General instructions Make sure you: Empty your bladder often and completely. Do not hold urine for long periods of time. Empty your bladder after sex. Wipe from front to back after urinating or having a bowel movement if you are female. Use each tissue only one time when you wipe. Drink enough fluid to keep your urine pale yellow. Keep all follow-up visits. This is important. Contact a health   care provider if: Your symptoms do not get better after 1-2 days. Your symptoms go away and then return. Get help right away if: You have severe pain in  your back or your lower abdomen. You have a fever or chills. You have nausea or vomiting. Summary A urinary tract infection (UTI) is an infection of any part of the urinary tract, which includes the kidneys, ureters, bladder, and urethra. Most urinary tract infections are caused by bacteria in your genital area. Treatment for this condition often includes antibiotic medicines. If you were prescribed an antibiotic medicine, take it as told by your health care provider. Do not stop using the antibiotic even if you start to feel better. Keep all follow-up visits. This is important. This information is not intended to replace advice given to you by your health care provider. Make sure you discuss any questions you have with your health care provider. Document Revised: 09/08/2019 Document Reviewed: 09/13/2019 Elsevier Patient Education  2024 Elsevier Inc.  

## 2022-11-28 LAB — URINE CULTURE

## 2022-11-29 DIAGNOSIS — Z90710 Acquired absence of both cervix and uterus: Secondary | ICD-10-CM | POA: Diagnosis not present

## 2022-11-29 DIAGNOSIS — Z1331 Encounter for screening for depression: Secondary | ICD-10-CM | POA: Diagnosis not present

## 2022-11-29 DIAGNOSIS — Z1272 Encounter for screening for malignant neoplasm of vagina: Secondary | ICD-10-CM | POA: Diagnosis not present

## 2022-11-29 DIAGNOSIS — Z01419 Encounter for gynecological examination (general) (routine) without abnormal findings: Secondary | ICD-10-CM | POA: Diagnosis not present

## 2022-12-03 ENCOUNTER — Other Ambulatory Visit: Payer: Self-pay | Admitting: Internal Medicine

## 2022-12-07 ENCOUNTER — Encounter: Payer: Self-pay | Admitting: Family

## 2022-12-14 ENCOUNTER — Other Ambulatory Visit: Payer: Self-pay | Admitting: Internal Medicine

## 2022-12-17 ENCOUNTER — Other Ambulatory Visit: Payer: Self-pay | Admitting: Family Medicine

## 2022-12-17 DIAGNOSIS — M19041 Primary osteoarthritis, right hand: Secondary | ICD-10-CM

## 2022-12-28 ENCOUNTER — Ambulatory Visit: Payer: 59 | Admitting: Nutrition

## 2023-01-03 ENCOUNTER — Ambulatory Visit: Payer: Medicare Other | Admitting: Family

## 2023-01-04 ENCOUNTER — Ambulatory Visit (INDEPENDENT_AMBULATORY_CARE_PROVIDER_SITE_OTHER): Payer: Medicare Other | Admitting: Family Medicine

## 2023-01-04 ENCOUNTER — Encounter: Payer: Self-pay | Admitting: Family Medicine

## 2023-01-04 VITALS — BP 158/71 | HR 114 | Temp 98.5°F | Ht 61.0 in | Wt 140.0 lb

## 2023-01-04 DIAGNOSIS — R051 Acute cough: Secondary | ICD-10-CM

## 2023-01-04 DIAGNOSIS — R0989 Other specified symptoms and signs involving the circulatory and respiratory systems: Secondary | ICD-10-CM

## 2023-01-04 DIAGNOSIS — E113299 Type 2 diabetes mellitus with mild nonproliferative diabetic retinopathy without macular edema, unspecified eye: Secondary | ICD-10-CM

## 2023-01-04 DIAGNOSIS — E1159 Type 2 diabetes mellitus with other circulatory complications: Secondary | ICD-10-CM

## 2023-01-04 DIAGNOSIS — Z794 Long term (current) use of insulin: Secondary | ICD-10-CM

## 2023-01-04 DIAGNOSIS — I152 Hypertension secondary to endocrine disorders: Secondary | ICD-10-CM

## 2023-01-04 DIAGNOSIS — R6889 Other general symptoms and signs: Secondary | ICD-10-CM | POA: Diagnosis not present

## 2023-01-04 LAB — VERITOR FLU A/B WAIVED
Influenza A: NEGATIVE
Influenza B: NEGATIVE

## 2023-01-04 MED ORDER — PREDNISONE 20 MG PO TABS
40.0000 mg | ORAL_TABLET | Freq: Every day | ORAL | 0 refills | Status: AC
Start: 2023-01-04 — End: 2023-01-09

## 2023-01-04 NOTE — Progress Notes (Signed)
Subjective:  Patient ID: Stephanie Carroll, female    DOB: 13-Nov-1955, 67 y.o.   MRN: [id number]  Patient Care Team: Junie Spencer, FNP as PCP - General (Family Medicine) Dione Booze, Bertram Millard, MD as Consulting Physician (Ophthalmology) Rande Brunt, MD as Referring Physician (Obstetrics and Gynecology)   Chief Complaint:  cough congestion (X 2 days/)   HPI: Stephanie Carroll is a 67 y.o. female presenting on 01/04/2023 for cough congestion (X 2 days/)  HPI 1. Cough and congestion  States that she started coughing with congestion on Monday night. Feels that she is getting bronchitis. States that she is staying with her mom in the hospital and may have "caught something there" Endorses shortness of breath. She has a history of asthma. Has been using nebulizer and inhaler.Nebulizer 1x per day. Inhaler 2 x per day. She continues Asmatex. States that it has been a while since she has had bronchitis. Endorses rhinorrhea, PND, slightly sore throat in mornings. She is not taking OTC medications except she is trying elderberry and Mucinex.   Relevant past medical, surgical, family, and social history reviewed and updated as indicated.  Allergies and medications reviewed and updated. Data reviewed: Chart in Epic.   Past Medical History:  Diagnosis Date   Allergy    Arthritis    DDD   Asthma    Colon cancer (HCC) 2008   Diabetes mellitus    Elevated liver enzymes    GERD (gastroesophageal reflux disease)    History of thyroiditis 12/18/2007   Formatting of this note might be different from the original. Thyroiditis   Hyperlipidemia    Iron deficiency anemia due to chronic blood loss 09/17/2020   Thyroid tumor, benign     Past Surgical History:  Procedure Laterality Date   BIOPSY THYROID     X2   CESAREAN SECTION     x 1   COLON SURGERY     FOOT FRACTURE SURGERY Left 02/2015   INCISIONAL HERNIA REPAIR     NASAL SEPTUM SURGERY     PARTIAL HYSTERECTOMY     POLYPECTOMY      UPPER GASTROINTESTINAL ENDOSCOPY     WRIST GANGLION EXCISION     left    Social History   Socioeconomic History   Marital status: Married    Spouse name: Not on file   Number of children: 2   Years of education: Not on file   Highest education level: Not on file  Occupational History   Occupation: claims for [employer]    Employer: [employer]  Tobacco Use   Smoking status: Never   Smokeless tobacco: Never   Tobacco comments:    never used product  Vaping Use   Vaping status: Never Used  Substance and Sexual Activity   Alcohol use: No    Alcohol/week: 0.0 standard drinks of alcohol   Drug use: No   Sexual activity: Not on file  Other Topics Concern   Not on file  Social History Narrative   Not on file   Social Determinants of Health   Financial Resource Strain: Low Risk  (11/22/2022)   Overall Financial Resource Strain (CARDIA)    Difficulty of Paying Living Expenses: Not hard at all  Food Insecurity: No Food Insecurity (06/28/2021)   Received from [facility]   Hunger Vital Sign    Worried About Running Out of Food in the Last Year: Never true    Ran Out of Food in  the Last Year: Never true  Transportation Needs: No Transportation Needs (11/22/2022)   PRAPARE - Administrator, Civil Service (Medical): No    Lack of Transportation (Non-Medical): No  Physical Activity: Insufficiently Active (11/22/2022)   Exercise Vital Sign    Days of Exercise per Week: 7 days    Minutes of Exercise per Session: 20 min  Stress: No Stress Concern Present (11/22/2022)   [employer] of Occupational Health - Occupational Stress Questionnaire    Feeling of Stress : Not at all  Social Connections: Socially Integrated (11/22/2022)   Social Connection and Isolation Panel [NHANES]    Frequency of Communication with Friends and Family: More than three times a week    Frequency of Social Gatherings with Friends and Family: More than three times a week    Attends  Religious Services: 1 to 4 times per year    Active Member of [employer] or Organizations: Yes    Attends Engineer, structural: More than 4 times per year    Marital Status: Married  Catering manager Violence: Not At Risk (08/17/2022)   Received from [facility]   HITS    Over the last 12 months how often did your partner physically hurt you?: Never    Over the last 12 months how often did your partner insult you or talk down to you?: Never    Over the last 12 months how often did your partner threaten you with physical harm?: Never    Over the last 12 months how often did your partner scream or curse at you?: Never    Outpatient Encounter Medications as of 01/04/2023  Medication Sig   albuterol (VENTOLIN HFA) 108 (90 Base) MCG/ACT inhaler Inhale 1-2 puffs into the lungs every 4 (four) hours as needed. For shortness for breath   ALPRAZolam (XANAX) 0.25 MG tablet Take 1 tablet (0.25 mg total) by mouth 2 (two) times daily as needed for anxiety.   BLACK ELDERBERRY PO Take 1 tablet by mouth daily.   Blood Glucose Monitoring Suppl (ONETOUCH VERIO REFLECT) w/Device KIT USE TO TEST BLOOD SUGAR ONCE DAILY IN THE EVENING   Cetirizine HCl 10 MG CAPS Take 10 mg by mouth as needed.   cholecalciferol (VITAMIN D) 25 MCG (1000 UT) tablet Take 1,000 Units by mouth daily.   Continuous Glucose Sensor (DEXCOM G7 SENSOR) MISC 1 Device by Does not apply route as directed.   diclofenac Sodium (VOLTAREN) 1 % GEL APPLY 2 GRAMS TO AFFECTED AREA 4 TIMES A DAY   EPINEPHrine 0.3 mg/0.3 mL IJ SOAJ injection as needed.   glucose blood (ONETOUCH VERIO) test strip 1 each by Other route 2 (two) times daily. Use as instructed   insulin glargine (LANTUS SOLOSTAR) 100 UNIT/ML Solostar Pen Inject 54 Units into the skin daily.   Insulin Pen Needle 32G X 4 MM MISC 1 Device by Does not apply route daily in the afternoon.   levalbuterol (XOPENEX) 1.25 MG/3ML nebulizer solution Take 1.25 mg by nebulization as  needed for wheezing.   metFORMIN (GLUCOPHAGE) 500 MG tablet TAKE 2 TABLETS BY MOUTH TWICE A DAY   Mometasone Furoate (ASMANEX HFA) 200 MCG/ACT AERO Inhale 1 puff into the lungs in the morning and at bedtime.   NON FORMULARY Plexus Probio 5 dietary supplement   NON FORMULARY Plexus Slim Hunger Control weight management series   ondansetron (ZOFRAN) 4 MG tablet TAKE 1 TABLET BY MOUTH EVERY 8 HOURS AS NEEDED FOR NAUSEA AND VOMITING  ondansetron (ZOFRAN) 4 MG tablet Take 1 tablet (4 mg total) by mouth every 8 (eight) hours as needed for nausea or vomiting.   RABEprazole (ACIPHEX) 20 MG tablet TAKE 1 TABLET BY MOUTH TWICE A DAY   rosuvastatin (CRESTOR) 5 MG tablet Take 1 tablet (5 mg total) by mouth daily.   Spacer/Aero-Holding Chambers (AEROCHAMBER PLUS WITH MASK) inhaler Use as directed   No facility-administered encounter medications on file as of 01/04/2023.    Allergies  Allergen Reactions   Fluticasone-Salmeterol Palpitations   Morphine Palpitations and Other (See Comments)    REACTION: heart racing   Peanut Oil Other (See Comments) and Cough    congested   Peanut-Containing Drug Products Cough   Sulfa Antibiotics Other (See Comments)    REACTION: takes away appetite, makes her feel nervous, jittery   Other Other (See Comments)    Oats and Green peppers---headaches   Penicillins Rash   Sulfamethoxazole Nausea Only   Sulfur Other (See Comments)    "out of her body"   Montelukast    Pioglitazone     Joint pain   Ciprofloxacin Anxiety   Clindamycin Rash   Cyclobenzaprine Other (See Comments)   Loracarbef Rash        Metronidazole Other (See Comments)   Moxifloxacin Hcl In Nacl Anxiety   Penicillin G Sodium Rash    Review of Systems As per HPI  Objective:  BP (!) 158/71   Pulse (!) 114   Temp 98.5 F (36.9 C)   Ht 5\' 1"  (1.549 m)   Wt 140 lb (63.5 kg)   SpO2 98%   BMI 26.45 kg/m    Wt Readings from Last 3 Encounters:  11/24/22 144 lb 9.6 oz (65.6 kg)  11/22/22  142 lb 12.8 oz (64.8 kg)  11/02/22 144 lb (65.3 kg)   Physical Exam Constitutional:      General: She is awake. She is not in acute distress.    Appearance: Normal appearance. She is well-developed and well-groomed. She is not ill-appearing, toxic-appearing or diaphoretic.     Interventions: She is not intubated. HENT:     Right Ear: No swelling or tenderness. No middle ear effusion. There is impacted cerumen. No foreign body. No mastoid tenderness. No PE tube. No hemotympanum. Tympanic membrane is not injected, scarred, perforated, erythematous, retracted or bulging.     Left Ear: No swelling or tenderness.  No middle ear effusion. There is no impacted cerumen. No foreign body. No mastoid tenderness. No PE tube. No hemotympanum. Tympanic membrane is scarred. Tympanic membrane is not injected, perforated, erythematous, retracted or bulging.     Nose: Congestion and rhinorrhea present. Rhinorrhea is clear.     Mouth/Throat:     Lips: Pink.     Mouth: Mucous membranes are moist.     Pharynx: Postnasal drip present.     Tonsils: No tonsillar exudate or tonsillar abscesses.  Cardiovascular:     Rate and Rhythm: Regular rhythm. Tachycardia present.     Pulses: Normal pulses.          Radial pulses are 2+ on the right side and 2+ on the left side.       Posterior tibial pulses are 2+ on the right side and 2+ on the left side.     Heart sounds: Normal heart sounds. No murmur heard.    No gallop.  Pulmonary:     Effort: Pulmonary effort is normal. No tachypnea, bradypnea, accessory muscle usage, prolonged expiration, respiratory distress or retractions.  She is not intubated.     Breath sounds: Normal breath sounds. No stridor, decreased air movement or transmitted upper airway sounds. No decreased breath sounds, wheezing, rhonchi or rales.  Musculoskeletal:     Cervical back: Full passive range of motion without pain and neck supple.     Right lower leg: No edema.     Left lower leg: No edema.   Skin:    General: Skin is warm.     Capillary Refill: Capillary refill takes less than 2 seconds.  Neurological:     General: No focal deficit present.     Mental Status: She is alert, oriented to person, place, and time and easily aroused. Mental status is at baseline.     GCS: GCS eye subscore is 4. GCS verbal subscore is 5. GCS motor subscore is 6.     Motor: No weakness.  Psychiatric:        Attention and Perception: Attention and perception normal.        Mood and Affect: Mood and affect normal.        Speech: Speech normal.        Behavior: Behavior normal. Behavior is cooperative.        Thought Content: Thought content normal. Thought content does not include homicidal or suicidal ideation. Thought content does not include homicidal or suicidal plan.        Cognition and Memory: Cognition and memory normal.        Judgment: Judgment normal.      Results for orders placed or performed in visit on 11/24/22  Urine Culture   Specimen: Urine   UR  Result Value Ref Range   Urine Culture, Routine Final report (A)    Organism ID, Bacteria Escherichia coli (A)    Antimicrobial Susceptibility Comment   Microscopic Examination   Urine  Result Value Ref Range   WBC, UA 0-5 0 - 5 /hpf   RBC, Urine None seen 0 - 2 /hpf   Epithelial Cells (non renal) 0-10 0 - 10 /hpf   Renal Epithel, UA None seen None seen /hpf   Bacteria, UA Few (A) None seen/Few   Yeast, UA None seen None seen  Urinalysis, Complete  Result Value Ref Range   Specific Gravity, UA <1.005 (L) 1.005 - 1.030   pH, UA 6.5 5.0 - 7.5   Color, UA Yellow Yellow   Appearance Ur Clear Clear   Leukocytes,UA 2+ (A) Negative   Protein,UA Negative Negative/Trace   Glucose, UA Negative Negative   Ketones, UA Negative Negative   RBC, UA Trace (A) Negative   Bilirubin, UA Negative Negative   Urobilinogen, Ur 0.2 0.2 - 1.0 mg/dL   Nitrite, UA Negative Negative   Microscopic Examination See below:        11/24/2022     3:50 PM 11/22/2022   10:44 AM 06/20/2022   10:47 AM 04/29/2022   11:56 AM 03/25/2022    9:49 AM  Depression screen PHQ 2/9  Decreased Interest 0 0 0 0 0  Down, Depressed, Hopeless 0 0 0 0 0  PHQ - 2 Score 0 0 0 0 0  Altered sleeping  1 0 0 0  Tired, decreased energy  0 0 0 0  Change in appetite  0 0 0 0  Feeling bad or failure about yourself   0 0 0 0  Trouble concentrating  0 0 0 0  Moving slowly or fidgety/restless  0 0 0 0  Suicidal  thoughts  0 0 0 0  PHQ-9 Score  1 0 0 0  Difficult doing work/chores  Not difficult at all Not difficult at all Not difficult at all Not difficult at all       11/22/2022   10:44 AM 04/29/2022   11:58 AM 02/28/2022    2:00 PM 01/14/2022    9:50 AM  GAD 7 : Generalized Anxiety Score  Nervous, Anxious, on Edge 0 0 0 0  Control/stop worrying 0 0 0 0  Worry too much - different things 0 0 0 0  Trouble relaxing 0 0 0 0  Restless 0 0 0 0  Easily annoyed or irritable 0 0 0 0  Afraid - awful might happen 0 0 0 0  Total GAD 7 Score 0 0 0 0  Anxiety Difficulty Not difficult at all Not difficult at all Not difficult at all Not difficult at all   Pertinent labs & imaging results that were available during my care of the patient were reviewed by me and considered in my medical decision making.  Assessment & Plan:  Stephanie Carroll was seen today for cough congestion.  Diagnoses and all orders for this visit:  Acute cough Labs as below. Will communicate results to patient once available. Will await results to determine next steps.  Will send in prednisone burst as below. Patient does not wish to start burst unless she absolutely has to due to her BG rising with prednisone. Provided a pocket prescription for patient if she feels that she needs it. Discussed that likely viral in etiology. Declined tessalon Perles. Patient to follow up as needed. Discussed with patient symptoms to monitor.  -     Veritor Flu A/B Waived -     COVID-19, Flu A+B and RSV -     predniSONE  (DELTASONE) 20 MG tablet; Take 2 tablets (40 mg total) by mouth daily with breakfast for 5 days.  Chest congestion As above.  -     Veritor Flu A/B Waived -     COVID-19, Flu A+B and RSV -     predniSONE (DELTASONE) 20 MG tablet; Take 2 tablets (40 mg total) by mouth daily with breakfast for 5 days.  Hypertension associated with type 2 diabetes mellitus (HCC) Elevated BP in office today. Patient to monitor and follow up with PCP.   Type 2 diabetes mellitus with mild nonproliferative retinopathy without macular edema, with long-term current use of insulin, unspecified laterality (HCC) Discussed monitoring symptoms at home and returning if symptoms worsen. Discussed monitoring BG at home during illness and if she takes prednisone.   Continue all other maintenance medications.  Follow up plan: Return if symptoms worsen or fail to improve.   Continue healthy lifestyle choices, including diet (rich in fruits, vegetables, and lean proteins, and low in salt and simple carbohydrates) and exercise (at least 30 minutes of moderate physical activity daily).  Written and verbal instructions provided   The above assessment and management plan was discussed with the patient. The patient verbalized understanding of and has agreed to the management plan. Patient is aware to call the clinic if they develop any new symptoms or if symptoms persist or worsen. Patient is aware when to return to the clinic for a follow-up visit. Patient educated on when it is appropriate to go to the emergency department.   Neale Burly, DNP-FNP [facility] [address] [place], [place] [address] [phone]

## 2023-01-05 LAB — COVID-19, FLU A+B AND RSV
Influenza A, NAA: NOT DETECTED
Influenza B, NAA: NOT DETECTED
RSV, NAA: NOT DETECTED
SARS-CoV-2, NAA: NOT DETECTED

## 2023-01-06 NOTE — Progress Notes (Signed)
Negative flu, covid, rsv. Continue supportive treatments at home. If symptoms continue, return for follow up.

## 2023-01-26 ENCOUNTER — Ambulatory Visit: Payer: Medicare Other | Admitting: Family

## 2023-01-27 DIAGNOSIS — Z1231 Encounter for screening mammogram for malignant neoplasm of breast: Secondary | ICD-10-CM | POA: Diagnosis not present

## 2023-01-27 LAB — HM MAMMOGRAPHY

## 2023-01-31 ENCOUNTER — Encounter: Payer: Self-pay | Admitting: Family

## 2023-02-02 ENCOUNTER — Ambulatory Visit: Payer: Self-pay | Admitting: Nutrition

## 2023-03-06 ENCOUNTER — Encounter: Payer: Medicare Other | Attending: Family | Admitting: Nutrition

## 2023-03-06 VITALS — Ht 61.0 in | Wt 142.6 lb

## 2023-03-06 DIAGNOSIS — E559 Vitamin D deficiency, unspecified: Secondary | ICD-10-CM | POA: Diagnosis not present

## 2023-03-06 DIAGNOSIS — K760 Fatty (change of) liver, not elsewhere classified: Secondary | ICD-10-CM | POA: Insufficient documentation

## 2023-03-06 DIAGNOSIS — I152 Hypertension secondary to endocrine disorders: Secondary | ICD-10-CM | POA: Diagnosis not present

## 2023-03-06 DIAGNOSIS — E1159 Type 2 diabetes mellitus with other circulatory complications: Secondary | ICD-10-CM | POA: Insufficient documentation

## 2023-03-06 DIAGNOSIS — E538 Deficiency of other specified B group vitamins: Secondary | ICD-10-CM | POA: Insufficient documentation

## 2023-03-06 DIAGNOSIS — Z794 Long term (current) use of insulin: Secondary | ICD-10-CM | POA: Insufficient documentation

## 2023-03-06 DIAGNOSIS — E785 Hyperlipidemia, unspecified: Secondary | ICD-10-CM | POA: Diagnosis not present

## 2023-03-06 DIAGNOSIS — E1169 Type 2 diabetes mellitus with other specified complication: Secondary | ICD-10-CM | POA: Diagnosis not present

## 2023-03-06 DIAGNOSIS — E119 Type 2 diabetes mellitus without complications: Secondary | ICD-10-CM | POA: Insufficient documentation

## 2023-03-06 NOTE — Patient Instructions (Signed)
Goals  Get back to exercising 30 minutes 3 times per week. Continue to increase high fiber foods. Work on meal planning Get A1C 6.5%. Keep drinking water

## 2023-03-06 NOTE — Progress Notes (Unsigned)
Medical Nutrition Therapy  Appointment Start time:  1120  Appointment End time:  1200  Primary concerns today: Dm Type 2 Referral diagnosis: E11.8 Preferred learning style: No preference  Learning readiness: Ready   NUTRITION ASSESSMENT  DM Follow up  Has been caring for her mom who has been sick. Last A1C 7.1%. FBS 99-119 mg/dl. Eating 3 meals per day. Lantus 50 units and Metformin 500 mg 4 times per day.     FBS: 100-130's.  Usually 200's at night-- may not be waiting the full 2 hours. Has cut out snacks. Eating more whole plant based foods. Loving broccoli, Has been walking some but had a fall and bruised her right side. She has to care for her husband and her aging parents.  Lantus 60 units, Metformin 1000 mg BID, no longer on Actos.. Sees Dr. Lonzo Cloud in August.     Latest Ref Rng & Units 10/27/2022   12:39 PM 06/17/2022   10:27 AM 03/25/2022   10:17 AM  CMP  Glucose 70 - 99 mg/dL 161  096  045   BUN 8 - 27 mg/dL 12  15  12    Creatinine 0.57 - 1.00 mg/dL 4.09  8.11  9.14   Sodium 134 - 144 mmol/L 139  139  138   Potassium 3.5 - 5.2 mmol/L 4.7  4.4  4.7   Chloride 96 - 106 mmol/L 100  99  97   CO2 20 - 29 mmol/L 22  28  20    Calcium 8.7 - 10.3 mg/dL 9.8  78.2  95.6   Total Protein 6.0 - 8.5 g/dL 7.3  8.2  7.2   Total Bilirubin 0.0 - 1.2 mg/dL 0.4  0.4  0.4   Alkaline Phos 44 - 121 IU/L 83  88  82   AST 0 - 40 IU/L 52  37  50   ALT 0 - 32 IU/L 56  44  65    Lipid Panel  Lab Results  Component Value Date   HGBA1C 7.1 (A) 10/10/2022    FBS's 120-130's   Night times; 190-230's had been on steriods due to some breathing issues. She has been checking it an hour after dinner instead of 2 hours. Changes made: has increasing eating more vegetables, grilled chicken, Sees Dr. Lonzo Cloud,  Lantus 60 units, Metformin 1000 mg BID, no longer on Actos..  Scheduled to see oncologist and PCP in the next few weeks. Last A1C was after she had been on steriod  injections.  Anthropometrics  Wt Readings from Last 3 Encounters:  01/04/23 140 lb (63.5 kg)  11/24/22 144 lb 9.6 oz (65.6 kg)  11/22/22 142 lb 12.8 oz (64.8 kg)   Ht Readings from Last 3 Encounters:  01/04/23 5\' 1"  (1.549 m)  11/24/22 5\' 1"  (1.549 m)  11/22/22 5\' 1"  (1.549 m)   There is no height or weight on file to calculate BMI. @BMIFA @ Facility age limit for growth %iles is 20 years. Facility age limit for growth %iles is 20 years.   Lipid Panel     Component Value Date/Time   CHOL 206 (H) 10/27/2022 1239   TRIG 226 (H) 10/27/2022 1239   HDL 37 (L) 10/27/2022 1239   CHOLHDL 5.6 (H) 10/27/2022 1239   CHOLHDL 6 03/08/2019 1603   VLDL 59.8 (H) 03/08/2019 1603   LDLCALC 129 (H) 10/27/2022 1239   LDLDIRECT 142.0 03/08/2019 1603   LABVLDL 40 10/27/2022 1239      Latest Ref Rng & Units 10/27/2022  12:39 PM 06/17/2022   10:27 AM 03/25/2022   10:17 AM  CMP  Glucose 70 - 99 mg/dL 643  329  518   BUN 8 - 27 mg/dL 12  15  12    Creatinine 0.57 - 1.00 mg/dL 8.41  6.60  6.30   Sodium 134 - 144 mmol/L 139  139  138   Potassium 3.5 - 5.2 mmol/L 4.7  4.4  4.7   Chloride 96 - 106 mmol/L 100  99  97   CO2 20 - 29 mmol/L 22  28  20    Calcium 8.7 - 10.3 mg/dL 9.8  16.0  10.9   Total Protein 6.0 - 8.5 g/dL 7.3  8.2  7.2   Total Bilirubin 0.0 - 1.2 mg/dL 0.4  0.4  0.4   Alkaline Phos 44 - 121 IU/L 83  88  82   AST 0 - 40 IU/L 52  37  50   ALT 0 - 32 IU/L 56  44  65     Clinical Medical Hx: See chart Medications: Lantus 66 units. Labs:  Lab Results  Component Value Date   HGBA1C 7.1 (A) 10/10/2022      Latest Ref Rng & Units 10/27/2022   12:39 PM 06/17/2022   10:27 AM 03/25/2022   10:17 AM  CMP  Glucose 70 - 99 mg/dL 323  557  322   BUN 8 - 27 mg/dL 12  15  12    Creatinine 0.57 - 1.00 mg/dL 0.25  4.27  0.62   Sodium 134 - 144 mmol/L 139  139  138   Potassium 3.5 - 5.2 mmol/L 4.7  4.4  4.7   Chloride 96 - 106 mmol/L 100  99  97   CO2 20 - 29 mmol/L 22  28  20    Calcium 8.7  - 10.3 mg/dL 9.8  37.6  28.3   Total Protein 6.0 - 8.5 g/dL 7.3  8.2  7.2   Total Bilirubin 0.0 - 1.2 mg/dL 0.4  0.4  0.4   Alkaline Phos 44 - 121 IU/L 83  88  82   AST 0 - 40 IU/L 52  37  50   ALT 0 - 32 IU/L 56  44  65    Lipid Panel     Component Value Date/Time   CHOL 206 (H) 10/27/2022 1239   TRIG 226 (H) 10/27/2022 1239   HDL 37 (L) 10/27/2022 1239   CHOLHDL 5.6 (H) 10/27/2022 1239   CHOLHDL 6 03/08/2019 1603   VLDL 59.8 (H) 03/08/2019 1603   LDLCALC 129 (H) 10/27/2022 1239   LDLDIRECT 142.0 03/08/2019 1603   LABVLDL 40 10/27/2022 1239    Notable Signs/Symptoms: none  Lifestyle & Dietary Hx Lives with her husband. Eats 3 meals per day usually.  Estimated daily fluid intake: 40 oz Supplements: Elderberry, Vit D, Plexus supplement Sleep: good Stress / self-care:  Current average weekly physical activity: walks  24-Hr Dietary Recall BEgg,  and 1 slice toast honey wheat . Lunch: Cabbage soup, banana, water DInner:  Grilled chicken, water   Calories: 1200 Carbohydrate: 135g Protein: 90g Fat: 33g   NUTRITION DIAGNOSIS  NB-1.1 Food and nutrition-related knowledge deficit As related to Diabetes Type 2.  As evidenced by A1C 7.9%..   NUTRITION INTERVENTION  Nutrition education (E-1) on the following topics:  Nutrition and Diabetes education provided on My Plate, CHO counting, meal planning, portion sizes, timing of meals, avoiding snacks between meals unless having a low blood sugar, target  ranges for A1C and blood sugars, signs/symptoms and treatment of hyper/hypoglycemia, monitoring blood sugars, taking medications as prescribed, benefits of exercising 30 minutes per day and prevention of complications of DM. Lifestyle Medicine  - Whole Food, Plant Predominant Nutrition is highly recommended: Eat Plenty of vegetables, Mushrooms, fruits, Legumes, Whole Grains, Nuts, seeds in lieu of processed meats, processed snacks/pastries red meat, poultry, eggs.    -It is  better to avoid simple carbohydrates including: Cakes, Sweet Desserts, Ice Cream, Soda (diet and regular), Sweet Tea, Candies, Chips, Cookies, Store Bought Juices, Alcohol in Excess of  1-2 drinks a day, Lemonade,  Artificial Sweeteners, Doughnuts, Coffee Creamers, "Sugar-free" Products, etc, etc.  This is not a complete list.....  Exercise: If you are able: 30 -60 minutes a day ,4 days a week, or 150 minutes a week.  The longer the better.  Combine stretch, strength, and aerobic activities.  If you were told in the past that you have high risk for cardiovascular diseases, you may seek evaluation by your heart doctor prior to initiating moderate to intense exercise programs.   Handouts Provided Include  Lifestyle medicine handouts Hypoglycemia handout  Learning Style & Readiness for Change Teaching method utilized: Visual & Auditory  Demonstrated degree of understanding via: Teach Back  Barriers to learning/adherence to lifestyle change: none  Goals Established by Pt  Keep up the great job!!!! Keep eating whole plant based foods.   MONITORING & EVALUATION Dietary intake, weekly physical activity, and blood sugars in 1 month.  Next Steps  Patient is to work on meal prepping and planning.

## 2023-03-07 ENCOUNTER — Encounter: Payer: Self-pay | Admitting: Nutrition

## 2023-03-21 ENCOUNTER — Ambulatory Visit
Admission: RE | Admit: 2023-03-21 | Discharge: 2023-03-21 | Disposition: A | Discharge status: Home / Self Care | Payer: Medicare Other | Source: Ambulatory Visit | Attending: Family Medicine | Admitting: Family Medicine

## 2023-03-21 VITALS — BP 148/88 | HR 131 | Temp 99.1°F | Resp 20

## 2023-03-21 DIAGNOSIS — J101 Influenza due to other identified influenza virus with other respiratory manifestations: Secondary | ICD-10-CM

## 2023-03-21 LAB — POC COVID19/FLU A&B COMBO
Covid Antigen, POC: NEGATIVE
Influenza A Antigen, POC: POSITIVE — AB
Influenza B Antigen, POC: NEGATIVE

## 2023-03-21 MED ORDER — OSELTAMIVIR PHOSPHATE 75 MG PO CAPS: 75.0000 mg | ORAL_CAPSULE | Freq: Two times a day (BID) | ORAL | 0 refills | Status: AC

## 2023-03-21 MED ORDER — PROMETHAZINE-DM 6.25-15 MG/5ML PO SYRP: 5.0000 mL | ORAL_SOLUTION | Freq: Four times a day (QID) | ORAL | 0 refills | Status: DC | PRN

## 2023-03-21 NOTE — ED Triage Notes (Signed)
Pt reports she has a cough, chills, and sinus problems x 2 days

## 2023-03-21 NOTE — ED Provider Notes (Signed)
 [facility]   [id number] 03/21/23 Arrival Time: 1345  ASSESSMENT & PLAN:  1. Influenza A    Without respiratory distress. Discussed typical duration of viral illness. Results for orders placed or performed during the hospital encounter of 03/21/23  POC Covid19/Flu A&B Antigen   Collection Time: 03/21/23  3:35 PM  Result Value Ref Range   Influenza A Antigen, POC Positive (A)    Influenza B Antigen, POC Negative    Covid Antigen, POC Negative    OTC symptom care as needed.  Meds ordered this encounter  Medications   oseltamivir  (TAMIFLU ) 75 MG capsule    Sig: Take 1 capsule (75 mg total) by mouth 2 (two) times daily for 5 days.    Dispense:  10 capsule    Refill:  0   promethazine -dextromethorphan (PROMETHAZINE -DM) 6.25-15 MG/5ML syrup    Sig: Take 5 mLs by mouth 4 (four) times daily as needed for cough.    Dispense:  118 mL    Refill:  0     Follow-up Information     Lavell Bari LABOR, FNP.   Specialty: Family Medicine Why: As needed. Contact information: [address] [place] [address] [phone]                 Reviewed expectations re: course of current medical issues. Questions answered. Outlined signs and symptoms indicating need for more acute intervention. Understanding verbalized. After Visit Summary given.   SUBJECTIVE: History from: Patient. Stephanie Carroll is a 68 y.o. female. Pt reports she has a cough, chills, and sinus problems x 2 days Denies: difficulty breathing. Normal PO intake without n/v/d.  OBJECTIVE:  Vitals:   03/21/23 1509  BP: (!) 148/88  Pulse: (!) 131  Resp: 20  Temp: 99.1 F (37.3 C)  TempSrc: Oral  SpO2: 97%    General appearance: alert; no distress Eyes: PERRLA; EOMI; conjunctiva normal HENT: [place]; AT; with nasal congestion Neck: supple  Lungs: speaks full sentences without difficulty; unlabored; CTAB Extremities: no edema Skin: warm and dry Neurologic: normal gait Psychological: alert  and cooperative; normal mood and affect  Labs: Results for orders placed or performed during the hospital encounter of 03/21/23  POC Covid19/Flu A&B Antigen   Collection Time: 03/21/23  3:35 PM  Result Value Ref Range   Influenza A Antigen, POC Positive (A)    Influenza B Antigen, POC Negative    Covid Antigen, POC Negative    Labs Reviewed  POC COVID19/FLU A&B COMBO - Abnormal; Notable for the following components:      Result Value   Influenza A Antigen, POC Positive (*)    All other components within normal limits    Imaging: No results found.  Allergies  Allergen Reactions   Fluticasone-Salmeterol Palpitations   Morphine Palpitations and Other (See Comments)    REACTION: heart racing   Peanut Oil Other (See Comments) and Cough    congested   Peanut-Containing Drug Products Cough   Sulfa Antibiotics Other (See Comments)    REACTION: takes away appetite, makes her feel nervous, jittery   Other Other (See Comments)    Oats and Green peppers---headaches   Penicillins Rash   Sulfamethoxazole Nausea Only   Sulfur Other (See Comments)    out of her body   Montelukast    Pioglitazone      Joint pain   Ciprofloxacin Anxiety   Clindamycin Rash   Cyclobenzaprine Other (See Comments)   Loracarbef Rash  Metronidazole Other (See Comments)   Moxifloxacin Hcl In Nacl Anxiety   Penicillin G Sodium Rash    Past Medical History:  Diagnosis Date   Allergy     Arthritis    DDD   Asthma    Colon cancer (HCC) 2008   Diabetes mellitus    Elevated liver enzymes    GERD (gastroesophageal reflux disease)    History of thyroiditis 12/18/2007   Formatting of this note might be different from the original. Thyroiditis   Hyperlipidemia    Iron deficiency anemia due to chronic blood loss 09/17/2020   Thyroid  tumor, benign    Social History   Socioeconomic History   Marital status: Married    Spouse name: Not on file   Number of children: 2   Years of education: Not on  file   Highest education level: Not on file  Occupational History   Occupation: claims for [employer]    Employer: [employer]  Tobacco Use   Smoking status: Never   Smokeless tobacco: Never   Tobacco comments:    never used product  Vaping Use   Vaping status: Never Used  Substance and Sexual Activity   Alcohol use: No    Alcohol/week: 0.0 standard drinks of alcohol   Drug use: No   Sexual activity: Not on file  Other Topics Concern   Not on file  Social History Narrative   Not on file   Social Drivers of Health   Financial Resource Strain: Low Risk  (11/22/2022)   Overall Financial Resource Strain (CARDIA)    Difficulty of Paying Living Expenses: Not hard at all  Food Insecurity: No Food Insecurity (06/28/2021)   Received from [facility], [facility]   Hunger Vital Sign    Worried About Running Out of Food in the Last Year: Never true    Ran Out of Food in the Last Year: Never true  Transportation Needs: No Transportation Needs (11/22/2022)   PRAPARE - Administrator, Civil Service (Medical): No    Lack of Transportation (Non-Medical): No  Physical Activity: Insufficiently Active (11/22/2022)   Exercise Vital Sign    Days of Exercise per Week: 7 days    Minutes of Exercise per Session: 20 min  Stress: No Stress Concern Present (11/22/2022)   [employer] of Occupational Health - Occupational Stress Questionnaire    Feeling of Stress : Not at all  Social Connections: Socially Integrated (11/22/2022)   Social Connection and Isolation Panel [NHANES]    Frequency of Communication with Friends and Family: More than three times a week    Frequency of Social Gatherings with Friends and Family: More than three times a week    Attends Religious Services: 1 to 4 times per year    Active Member of [employer] or Organizations: Yes    Attends Engineer, Structural: More than 4 times per year    Marital Status: Married  Catering Manager Violence: Not At Risk  (08/17/2022)   Received from [employer], [facility]   HITS    Over the last 12 months how often did your partner physically hurt you?: Never    Over the last 12 months how often did your partner insult you or talk down to you?: Never    Over the last 12 months how often did your partner threaten you with physical harm?: Never    Over the last 12 months how often did your partner scream or curse at you?: Never  Family History  Problem Relation Age of Onset   COPD Other        both sides   Diabetes Other        both sides   Heart disease Father    Kidney failure Father    Lung cancer Paternal Uncle    Leukemia Paternal Grandfather    Cervical cancer Paternal Aunt        x 2    Heart defect Mother        MVP   Breast cancer Mother    Colon cancer Neg Hx    Esophageal cancer Neg Hx    Rectal cancer Neg Hx    Stomach cancer Neg Hx    Past Surgical History:  Procedure Laterality Date   BIOPSY THYROID      X2   CESAREAN SECTION     x 1   COLON SURGERY     FOOT FRACTURE SURGERY Left 02/2015   INCISIONAL HERNIA REPAIR     NASAL SEPTUM SURGERY     PARTIAL HYSTERECTOMY     POLYPECTOMY     UPPER GASTROINTESTINAL ENDOSCOPY     WRIST GANGLION EXCISION     left     Rolinda Rogue, MD 03/21/23 1546

## 2023-03-22 ENCOUNTER — Ambulatory Visit: Payer: Medicare Other | Admitting: Family Medicine

## 2023-03-23 ENCOUNTER — Ambulatory Visit: Payer: Medicare Other | Admitting: Family Medicine

## 2023-03-23 ENCOUNTER — Encounter: Payer: Self-pay | Admitting: Family Medicine

## 2023-03-23 ENCOUNTER — Ambulatory Visit: Payer: Self-pay | Admitting: Family

## 2023-03-23 VITALS — BP 122/62 | HR 100 | Temp 98.9°F

## 2023-03-23 DIAGNOSIS — J111 Influenza due to unidentified influenza virus with other respiratory manifestations: Secondary | ICD-10-CM | POA: Diagnosis not present

## 2023-03-23 MED ORDER — ONDANSETRON HCL 4 MG PO TABS: 4.0000 mg | ORAL_TABLET | Freq: Three times a day (TID) | ORAL | 1 refills | Status: DC | PRN

## 2023-03-23 NOTE — Progress Notes (Signed)
   Subjective:    Patient ID: Donny HERO Guardino, female    DOB: [birth year], 68 y.o.   MRN: [id number]  HPI Flu- pt tested + on 2/4.  Sxs started on Sunday.  Was started on Tamiflu .  Pt reports cough is productive of 'thick yellow sputum'.  Having bilateral rib pain from coughing.  Was given promethazine  cough syrup.  No fever.  + nausea and diarrhea.  'i just feel so weak'.  Attempting to increase fluid intake but had hard time staying awake yesterday.   Review of Systems For ROS see HPI     Objective:   Physical Exam Vitals reviewed.  Constitutional:      General: She is not in acute distress.    Appearance: She is well-developed.     Comments: Obviously not feeling well  HENT:     Head: Normocephalic and atraumatic.     Right Ear: Tympanic membrane normal.     Left Ear: Tympanic membrane normal.     Nose: Mucosal edema and congestion present. No rhinorrhea.     Right Sinus: No maxillary sinus tenderness or frontal sinus tenderness.     Left Sinus: No maxillary sinus tenderness or frontal sinus tenderness.     Mouth/Throat:     Pharynx: Uvula midline. Posterior oropharyngeal erythema present. No oropharyngeal exudate.  Eyes:     Conjunctiva/sclera: Conjunctivae normal.     Pupils: Pupils are equal, round, and reactive to light.  Cardiovascular:     Rate and Rhythm: Normal rate and regular rhythm.     Heart sounds: Normal heart sounds.  Pulmonary:     Effort: Pulmonary effort is normal. No respiratory distress.     Breath sounds: Normal breath sounds. No wheezing.     Comments: + hacking cough Musculoskeletal:     Cervical back: Normal range of motion and neck supple.  Lymphadenopathy:     Cervical: No cervical adenopathy.  Skin:    General: Skin is warm.           Assessment & Plan:  Flu- new to provider, ongoing for pt.  She is already on Tamiflu - encouraged her to finish as directed.  Will add Zofran  as needed for N/V/D.  Stressed need for fluids and rest.  Reviewed  supportive care and red flags that should prompt return.  Pt expressed understanding and is in agreement w/ plan.

## 2023-03-23 NOTE — Patient Instructions (Signed)
 Follow up as needed or as scheduled Finish the Tamiflu  as directed Drink LOTS of fluids USE the nausea medication as needed REST! Call with any questions or concerns Hang in there!!

## 2023-03-23 NOTE — Telephone Encounter (Signed)
 Copied from CRM [id number]. Topic: Clinical - Red Word Triage >> Mar 23, 2023 10:39 AM Stephanie Carroll wrote: Red Word that prompted transfer to Nurse Triage: Pt has pain in rib area on both sides. Pt was diagnosis with flu.  Chief Complaint: Rib pain Symptoms: SOB, cough, fatigue Frequency: Continuous Pertinent Negatives: Patient denies fever, vomiting, nausea,dizziness Disposition: [] ED /[] Urgent Care (no appt availability in office) / [x] Appointment(In office/virtual)/ []  [place] [facility]/ [] Home Care/ [] Refused Recommended Disposition /[] [place] [facility]/ []  Follow-up with PCP Additional Notes: Pt reports she tested positive for flu and now has rib pain. Pt is worried about pneumonia. Pt reports cough, SOB, and fatigue. Appt scheduled for today. This RN educated pt on home care, new-worsening symptoms, when to call back/seek emergent care. Pt verbalized understanding and agrees to plan.   Reason for Disposition  [1] Chest pain lasts > 5 minutes AND [2] occurred > 3 days ago (72 hours) AND [3] NO chest pain or cardiac symptoms now  Answer Assessment - Initial Assessment Questions 1. LOCATION: Where does it hurt?       Rib- both sides, more on right 2. RADIATION: Does the pain go anywhere else? (e.g., into neck, jaw, arms, back)     Back 3. ONSET: When did the chest pain begin? (Minutes, hours or days)      Yesterday- late last night 4. PATTERN: Does the pain come and go, or has it been constant since it started?  Does it get worse with exertion?      Hurts when breathe, feel SOB 6. SEVERITY: How bad is the pain?  (e.g., Scale 1-10; mild, moderate, or severe)    - MILD (1-3): doesn't interfere with normal activities     - MODERATE (4-7): interferes with normal activities or awakens from sleep    - SEVERE (8-10): excruciating pain, unable to do any normal activities       7 7. CARDIAC RISK FACTORS: Do you have any history of heart problems or risk factors for heart  disease? (e.g., angina, prior heart attack; diabetes, high blood pressure, high cholesterol, smoker, or strong family history of heart disease)     Diabetes 8. PULMONARY RISK FACTORS: Do you have any history of lung disease?  (e.g., blood clots in lung, asthma, emphysema, birth control pills)     Asthma 10. OTHER SYMPTOMS: Do you have any other symptoms? (e.g., dizziness, nausea, vomiting, sweating, fever, difficulty breathing, cough)       Cough, fatigue  Protocols used: Chest Pain-A-AH

## 2023-04-12 ENCOUNTER — Ambulatory Visit: Payer: Medicare Other | Admitting: Internal Medicine

## 2023-04-25 ENCOUNTER — Ambulatory Visit (INDEPENDENT_AMBULATORY_CARE_PROVIDER_SITE_OTHER): Admitting: Student in an Organized Health Care Education/Training Program

## 2023-04-25 ENCOUNTER — Other Ambulatory Visit: Payer: Self-pay | Admitting: *Deleted

## 2023-04-25 ENCOUNTER — Encounter: Payer: Self-pay | Admitting: Student in an Organized Health Care Education/Training Program

## 2023-04-25 VITALS — BP 140/80 | HR 113 | Temp 99.1°F | Wt 144.0 lb

## 2023-04-25 DIAGNOSIS — J0110 Acute frontal sinusitis, unspecified: Secondary | ICD-10-CM

## 2023-04-25 DIAGNOSIS — J019 Acute sinusitis, unspecified: Secondary | ICD-10-CM | POA: Insufficient documentation

## 2023-04-25 LAB — POC COVID19 BINAXNOW: SARS Coronavirus 2 Ag: NEGATIVE

## 2023-04-25 MED ORDER — DOXYCYCLINE HYCLATE 100 MG PO TABS: 100.0000 mg | ORAL_TABLET | Freq: Two times a day (BID) | ORAL | 0 refills | Status: AC

## 2023-04-25 MED ORDER — RABEPRAZOLE SODIUM 20 MG PO TBEC: 20.0000 mg | DELAYED_RELEASE_TABLET | Freq: Two times a day (BID) | ORAL | 3 refills | Status: AC

## 2023-04-25 NOTE — Assessment & Plan Note (Signed)
 Acute issue for 4 days in this person with a history of sinus surgery, deviated septum, and recurrent sinusitis. Last antibiotic treatment was about a year ago. No systemic symptoms. We talked about sinus irrigation techniques. She has numerous low risk allergies listed to penicillin and sulfa. It looks like she has tolerated doxycyline well in the past. We talked about options for supportive care without antibiotics, and risks of antibiotics.

## 2023-04-25 NOTE — Progress Notes (Signed)
   Acute Office Visit  Subjective:     Patient ID: Stephanie Carroll, female    DOB: [birth year], 68 y.o.   MRN: [id number]  Chief Complaint  Patient presents with   Nasal Congestion    Patient states congestion and face pressure. Has noticed some yellow mucus. Did test positive for flu a month ago. Has been taking mucanex and salin spray.     HPI Patient is in today for congestion and sinus pressure.  68 year old person living with asthma and diabetes on insulin here with increased sinus congestion and drainage. She says it feels like prior sinus infections, gets these about every year. Has had sinus surgery in the past for recurrent infections to fix deviated septum. She uses nasal irrigation sprays. No fevers at home, but is having chills, and feels more fatigued. Her son also had a viral illness recently.   ROS  No chest pain, no dyspnea or orthopnea. She has a mild nonproductive cough.      Objective:    BP (!) 140/80   Pulse (!) 113   Temp 99.1 F (37.3 C) (Temporal)   Wt 144 lb (65.3 kg)   SpO2 98%   BMI 27.21 kg/m    Physical Exam  Gen: well appearing woman Ears: bilateral TMs are normal Nose: errythematous mucosa, septum appears normal Mouth: no drainage, crowded posterior OP, large tongue Neck: no adenopathy Lungs: clear throughout, now wheezing Heart: regular, no murmurs      Assessment & Plan:   Problem List Items Addressed This Visit       Unprioritized   Acute sinusitis - Primary   Acute issue for 4 days in this person with a history of sinus surgery, deviated septum, and recurrent sinusitis. Last antibiotic treatment was about a year ago. No systemic symptoms. We talked about sinus irrigation techniques. She has numerous low risk allergies listed to penicillin and sulfa. It looks like she has tolerated doxycyline well in the past. We talked about options for supportive care without antibiotics, and risks of antibiotics.        No orders of the  defined types were placed in this encounter.   No follow-ups on file.  Tyson Alias, MD

## 2023-04-29 ENCOUNTER — Other Ambulatory Visit: Payer: Self-pay | Admitting: Internal Medicine

## 2023-05-03 ENCOUNTER — Ambulatory Visit (INDEPENDENT_AMBULATORY_CARE_PROVIDER_SITE_OTHER): Admitting: Family Medicine

## 2023-05-03 ENCOUNTER — Encounter: Payer: Self-pay | Admitting: Family Medicine

## 2023-05-03 VITALS — BP 134/64 | HR 101 | Temp 98.5°F | Ht 61.0 in | Wt 140.5 lb

## 2023-05-03 DIAGNOSIS — M940 Chondrocostal junction syndrome [Tietze]: Secondary | ICD-10-CM

## 2023-05-03 MED ORDER — PREDNISONE 10 MG PO TABS
ORAL_TABLET | ORAL | 0 refills | Status: DC
Start: 2023-05-03 — End: 2023-07-14

## 2023-05-03 NOTE — Progress Notes (Signed)
   Subjective:    Patient ID: Stephanie Carroll, female    DOB: 05/09/1955, 68 y.o.   MRN: [id number]  HPI R rib pain- pain started yesterday under R breast and spread along her lower ribs.  Pt is still coughing up 'phlegm' that gets stuck in her throat from her recent URI.  Reports ongoing nasal congestion, PND.  Rib pain is described as a burning at times.  Mild TTP.  No difficulty taking a deep breath.  Had some relief w/ heating pad.   Review of Systems For ROS see HPI     Objective:   Physical Exam Vitals reviewed.  Constitutional:      General: She is not in acute distress.    Appearance: Normal appearance. She is not ill-appearing.  HENT:     Head: Normocephalic and atraumatic.     Right Ear: Tympanic membrane and ear canal normal.     Left Ear: Tympanic membrane and ear canal normal.     Nose: Congestion present. No rhinorrhea.  Eyes:     Extraocular Movements: Extraocular movements intact.     Conjunctiva/sclera: Conjunctivae normal.  Cardiovascular:     Rate and Rhythm: Normal rate and regular rhythm.  Pulmonary:     Effort: Pulmonary effort is normal. No respiratory distress.     Breath sounds: No wheezing or rhonchi.     Comments: Dry cough Abdominal:     General: There is no distension.     Palpations: Abdomen is soft.     Tenderness: There is abdominal tenderness (TTP over R lower ribs but no abd TTP). There is no guarding or rebound.  Musculoskeletal:     Cervical back: Neck supple.  Lymphadenopathy:     Cervical: No cervical adenopathy.  Skin:    General: Skin is warm and dry.  Neurological:     General: No focal deficit present.     Mental Status: She is alert and oriented to person, place, and time.  Psychiatric:        Mood and Affect: Mood normal.        Behavior: Behavior normal.           Assessment & Plan:  Costochondritis- new.  Pt w/ apparent musculoskeletal pain on exam- I suspect from coughing.  She doesn't show any evidence of bacterial  infxn on exam and her current Doxycycline regimen should treat any lingering infxn.  Start Prednisone taper for her pain and inflammation.  Cautioned her to watch her sugars.  Encouraged her to use the heating pad as needed and to keep [place] updated if things don't improve.  Pt expressed understanding and is in agreement w/ plan.

## 2023-05-03 NOTE — Patient Instructions (Addendum)
 Follow up as needed or as scheduled START the Prednisone as directed- 3 pills at the same time x3 days, then 2 pills at the same time x3 days, then 1 pill daily.  Take w/ food  Continue to use the heating pad for pain relief Take the Zyrtec daily Add Mucinex to help thin congestion Drink plenty of water! Call with any questions or concerns- particularly if not getting better Hang in there!!!

## 2023-05-20 IMAGING — DX DG ABDOMEN 2V
3 series · 3 of 3 positions shown · non-contrast
Comparison: Ultrasound 09/21/2018.  CT 06/12/2009.

CLINICAL DATA: Left upper quadrant pain.

EXAM:
ABDOMEN - 2 VIEW

[abdomen erect]
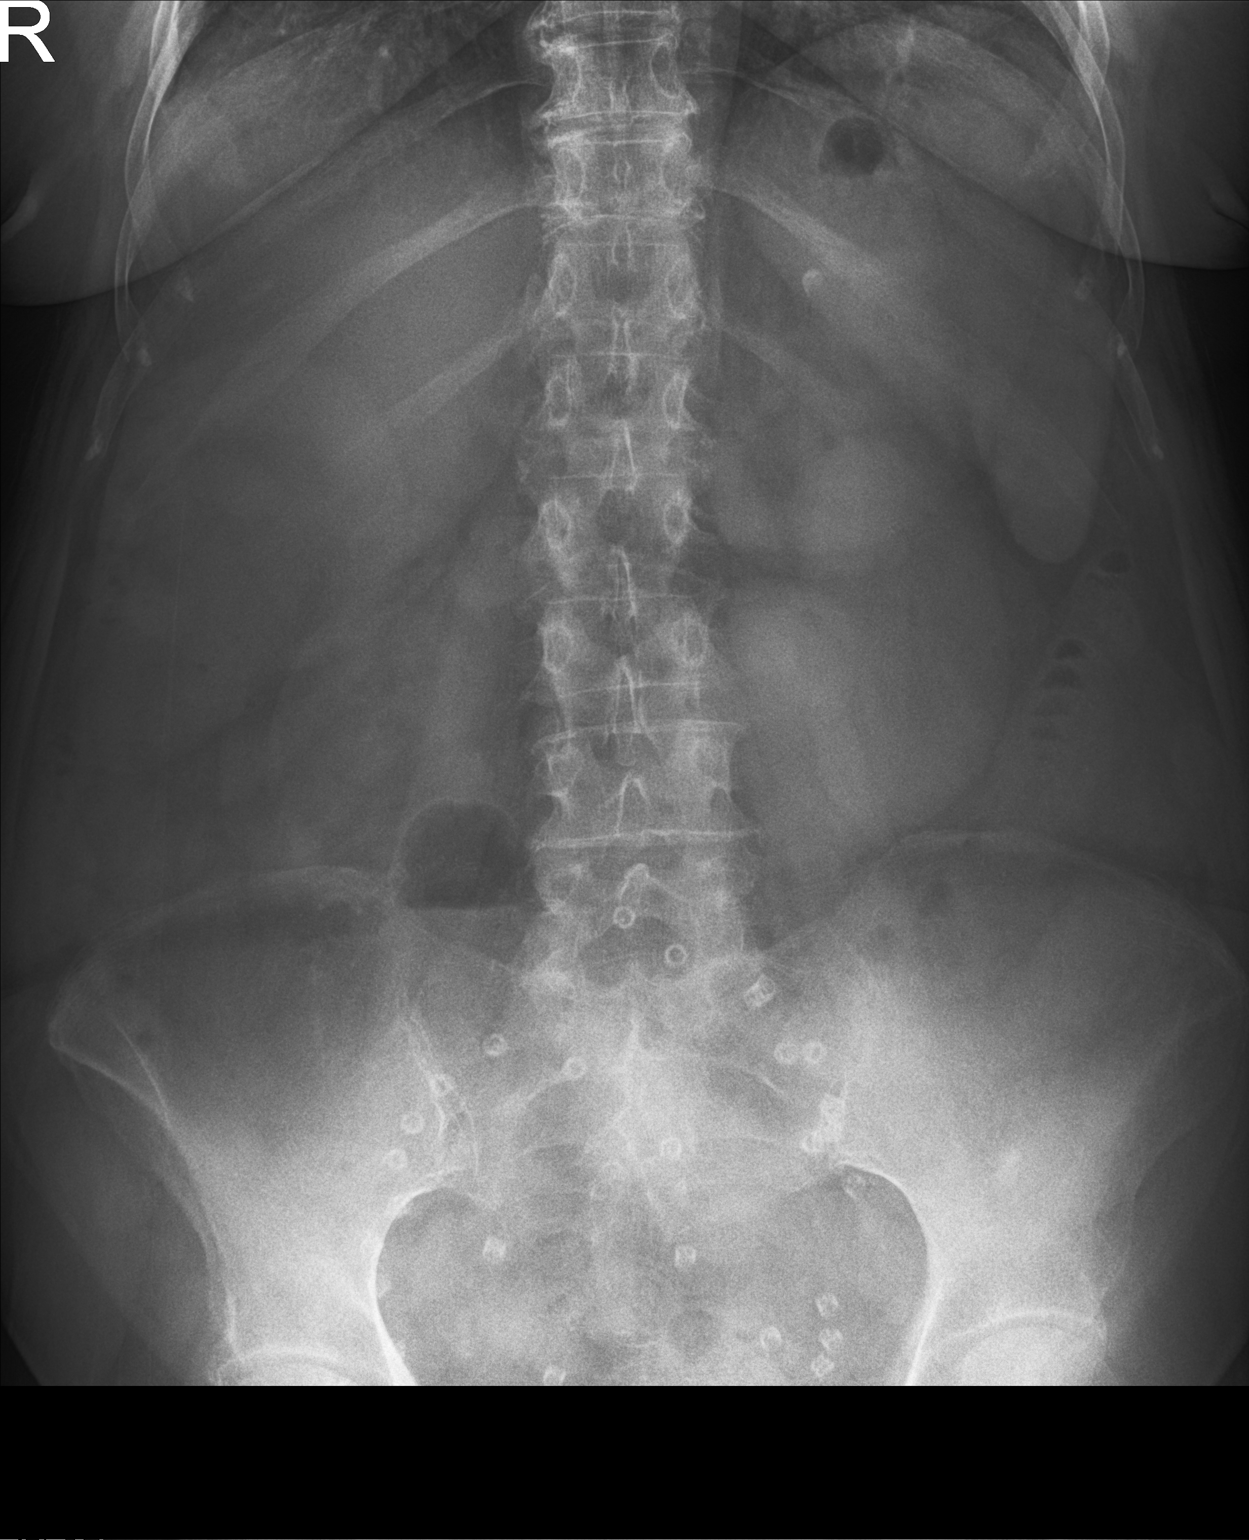

[abdomen supine (1 of 2)]
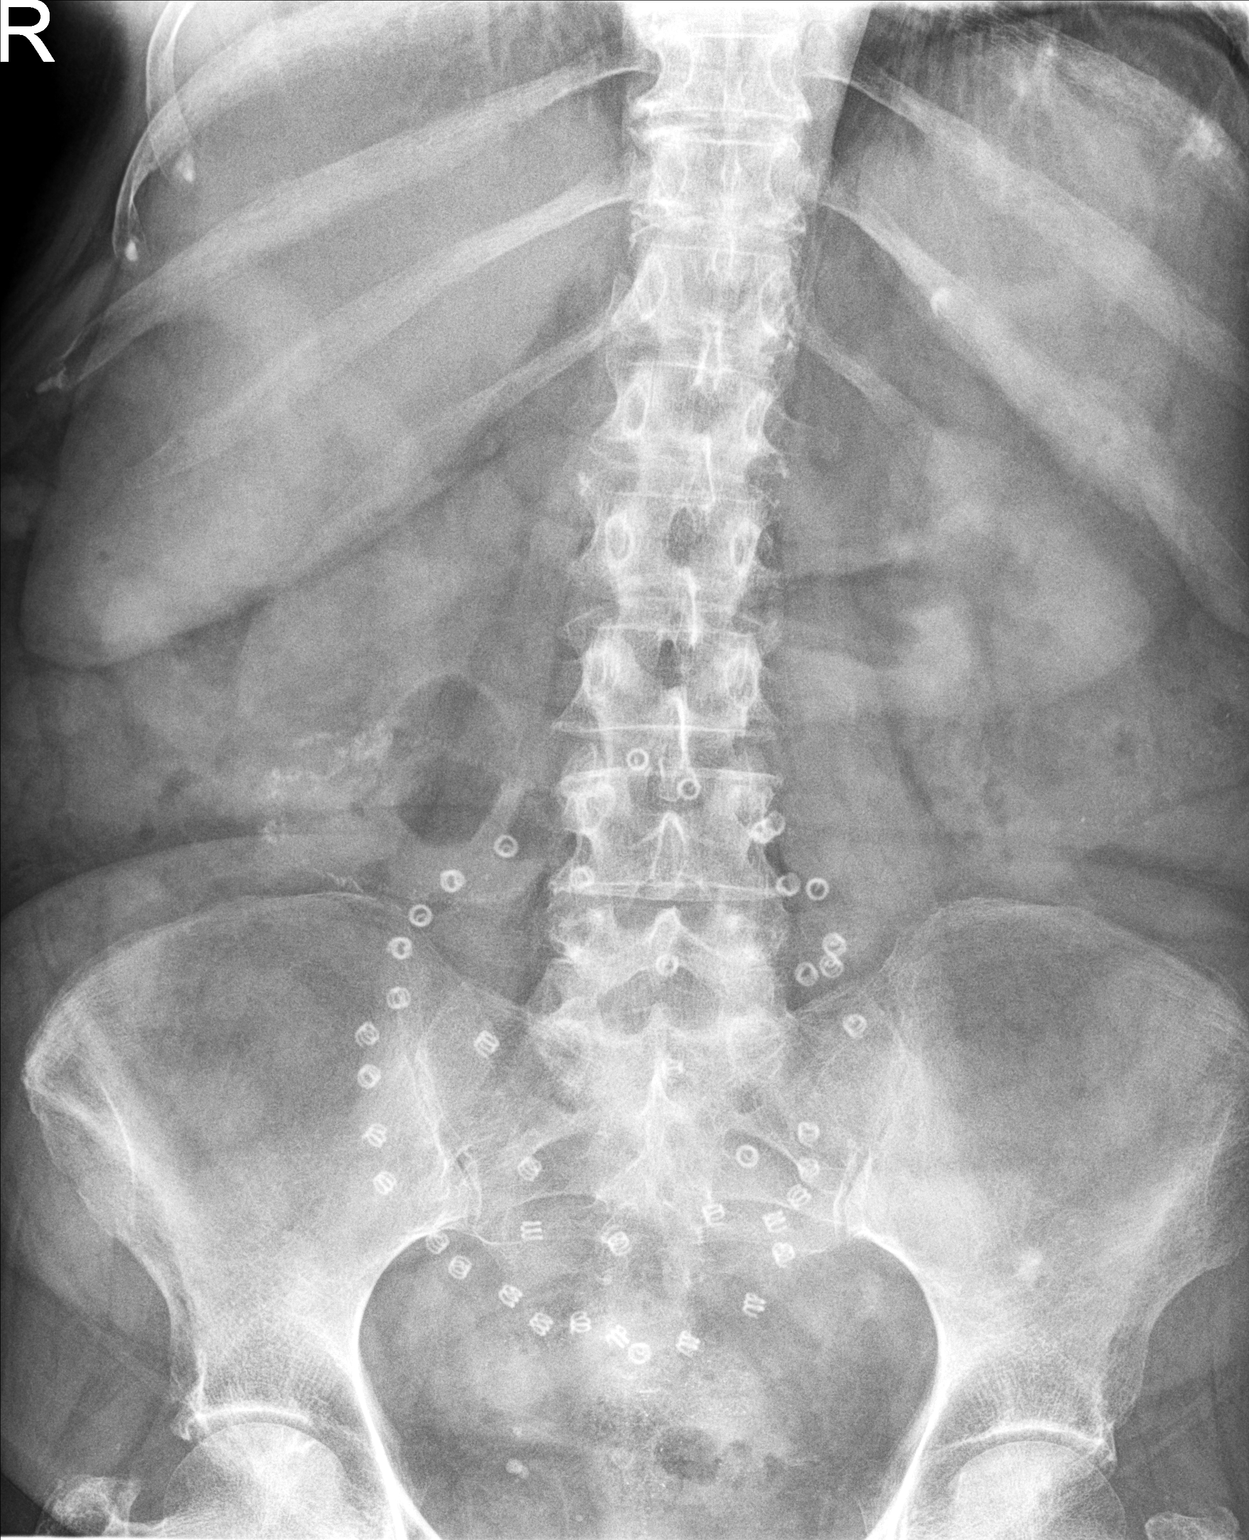

[abdomen supine (2 of 2)]
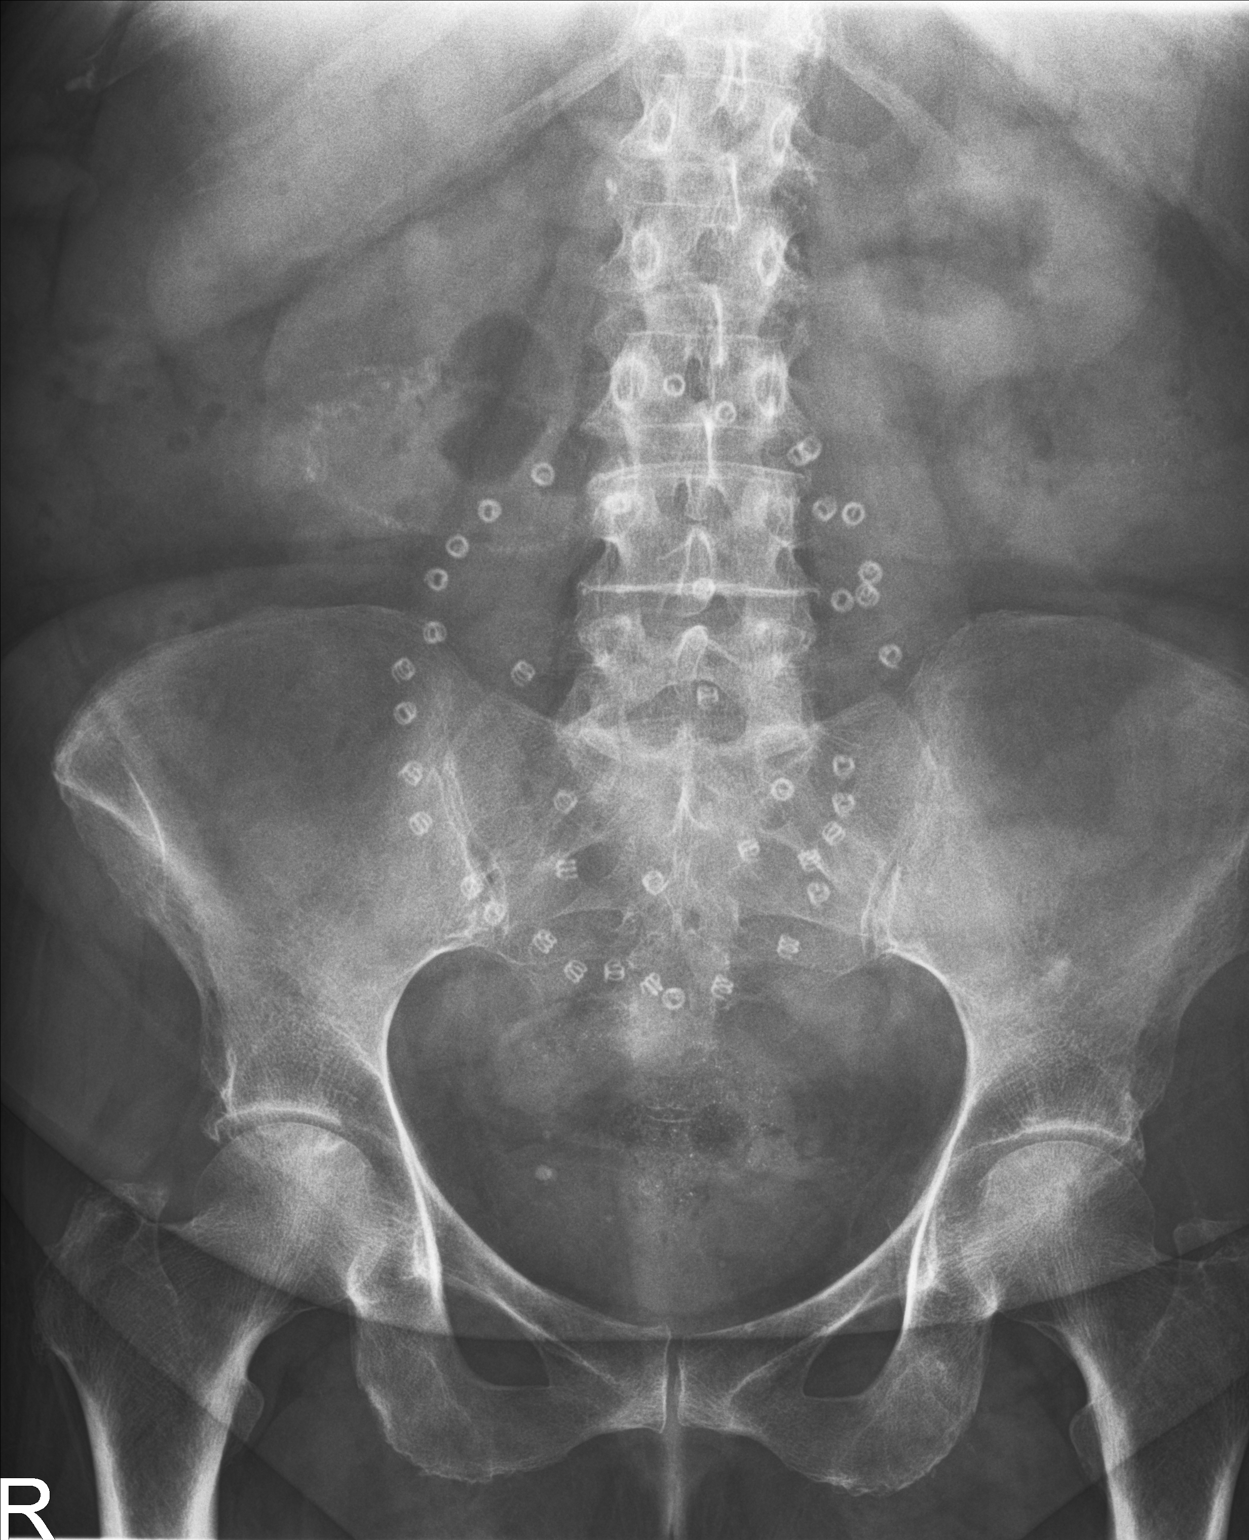

[3 of 3 positions shown; findings below may reference images not displayed]

FINDINGS: Surgical clips and sutures noted over the lower abdomen and pelvis.
Soft tissue structures are unremarkable. Large amount of stool noted
throughout the colon. No bowel distention or free air. Tiny
sclerotic focus of the left ilium again noted, most likely benign
bone island. Degenerative change lumbar spine and both hips. Splenic
artery atherosclerotic calcification again noted. Vascular pelvic
calcifications consistent with phleboliths.
IMPRESSION: Large amount of stool noted throughout the colon. No acute
abnormality. No bowel distention or free air.

## 2023-05-26 ENCOUNTER — Ambulatory Visit: Payer: Medicare Other | Admitting: Internal Medicine

## 2023-05-26 ENCOUNTER — Encounter: Payer: Self-pay | Admitting: Internal Medicine

## 2023-05-26 VITALS — BP 120/70 | HR 110 | Ht 61.0 in | Wt 143.0 lb

## 2023-05-26 DIAGNOSIS — Z794 Long term (current) use of insulin: Secondary | ICD-10-CM

## 2023-05-26 DIAGNOSIS — E113299 Type 2 diabetes mellitus with mild nonproliferative diabetic retinopathy without macular edema, unspecified eye: Secondary | ICD-10-CM

## 2023-05-26 DIAGNOSIS — E1165 Type 2 diabetes mellitus with hyperglycemia: Secondary | ICD-10-CM

## 2023-05-26 LAB — POCT GLYCOSYLATED HEMOGLOBIN (HGB A1C): Hemoglobin A1C: 7.8 % — AB (ref 4.0–5.6)

## 2023-05-26 LAB — POCT GLUCOSE (DEVICE FOR HOME USE): POC Glucose: 101 mg/dL — AB (ref 70–99)

## 2023-05-26 NOTE — Patient Instructions (Signed)
 Continue  Lantus 60 units daily  Continue Metformin 500 mg, 2 tablets twice daily    HOW TO TREAT LOW BLOOD SUGARS (Blood sugar LESS THAN 70 MG/DL) Please follow the RULE OF 15 for the treatment of hypoglycemia treatment (when your (blood sugars are less than 70 mg/dL)   STEP 1: Take 15 grams of carbohydrates when your blood sugar is low, which includes:  3-4 GLUCOSE TABS  OR 3-4 OZ OF JUICE OR REGULAR SODA OR ONE TUBE OF GLUCOSE GEL    STEP 2: RECHECK blood sugar in 15 MINUTES STEP 3: If your blood sugar is still low at the 15 minute recheck --> then, go back to STEP 1 and treat AGAIN with another 15 grams of carbohydrates.

## 2023-05-26 NOTE — Progress Notes (Signed)
 Name: Stephanie Carroll  MRN/ DOB: [id number], May 30, 1955   Age/ Sex: 68 y.o., female    PCP: Junie Spencer, FNP   Reason for Endocrinology Evaluation: Type 2 Diabetes Mellitus     Date of Initial Endocrinology Visit: 10/29/2021    PATIENT IDENTIFIER: Stephanie Carroll is a 68 y.o. female with a past medical history of T2DM, Asthma, HTN and dyslipidemia, Hx of colon cancer. . The patient presented for initial endocrinology clinic visit on 10/29/2021 for consultative assistance with her diabetes management.    HPI: Stephanie Carroll was    Diagnosed with DM 2010 Prior Medications tried/Intolerance: She was prescribed Jardiance  but did not take it.              Hemoglobin A1c ranging from   7.9% in 2023, peaking at 9.4% in 2022   She was seen by [facility] 06/2021  On her initial visit to our clinic she had an A1c of 7.9%, she was on metformin and basal insulin only, will increase basal insulin   Started pioglitazone 03/2022 with an A1c 9.3% but discontinued due to severe joint pains   She opted to try Ozempic because her brother had positive results with that 09/2022 but she did not start it as she was scared of side effects of pancreatitis   LEFT THYROID NODULE : She is S/P FNA of the left thyroid nodule 2.7 cm nodule in 2011 . Ultrasound showed stability in 2023  SUBJECTIVE:   During the last visit (10/10/2022): A1c 7.9%   Today (05/26/23): Stephanie Carroll  checks her blood sugars 2 times daily. The patient has not had hypoglycemic episodes since the last clinic visit    She has hx of chronic abdominal issues due to hx of colon cancer.  She has  chronic diarrhea which has been improving  She has history of recurrent genital yeast infections No arthralgias recently  She contracted influenza  Mother has chronic pancreatitis and PNA    HOME DIABETES REGIMEN: Ozempic 0.25 mg weekly -not taking Metformin 500 mg XR , 2 tabs BID Lantus 60 units daily    Statin: yes ACE-I/ARB:  no    METER DOWNLOAD SUMMARY: n/a  DIABETIC COMPLICATIONS: Microvascular complications:  Mild nonproliferative DR Denies: CKD Last eye exam: Completed 08/2021- Dr. Dione Booze   Macrovascular complications:   Denies: CAD, PVD, CVA   PAST HISTORY: Past Medical History:  Past Medical History:  Diagnosis Date   Allergy    Arthritis    DDD   Asthma    Colon cancer (HCC) 2008   Diabetes mellitus    Elevated liver enzymes    GERD (gastroesophageal reflux disease)    History of thyroiditis 12/18/2007   Formatting of this note might be different from the original. Thyroiditis   Hyperlipidemia    Iron deficiency anemia due to chronic blood loss 09/17/2020   Thyroid tumor, benign    Past Surgical History:  Past Surgical History:  Procedure Laterality Date   BIOPSY THYROID     X2   CESAREAN SECTION     x 1   COLON SURGERY     FOOT FRACTURE SURGERY Left 02/2015   INCISIONAL HERNIA REPAIR     NASAL SEPTUM SURGERY     PARTIAL HYSTERECTOMY     POLYPECTOMY     UPPER GASTROINTESTINAL ENDOSCOPY     WRIST GANGLION EXCISION     left    Social History:  reports that she has never smoked. She  has never used smokeless tobacco. She reports that she does not drink alcohol and does not use drugs. Family History:  Family History  Problem Relation Age of Onset   COPD Other        both sides   Diabetes Other        both sides   Heart disease Father    Kidney failure Father    Lung cancer Paternal Uncle    Leukemia Paternal Grandfather    Cervical cancer Paternal Aunt        x 2    Heart defect Mother        MVP   Breast cancer Mother    Colon cancer Neg Hx    Esophageal cancer Neg Hx    Rectal cancer Neg Hx    Stomach cancer Neg Hx      HOME MEDICATIONS: Allergies as of 05/26/2023       Reactions   Fluticasone-salmeterol Palpitations   Morphine Palpitations, Other (See Comments)   REACTION: heart racing   Peanut Oil Other (See Comments), Cough   congested    Peanut-containing Drug Products Cough   Sulfa Antibiotics Other (See Comments)   REACTION: takes away appetite, makes her feel nervous, jittery   Other Other (See Comments)   Oats and Green peppers---headaches   Penicillins Rash   Sulfamethoxazole Nausea Only   Sulfur Other (See Comments)   "out of her body"   Montelukast    Pioglitazone    Joint pain   Ciprofloxacin Anxiety   Clindamycin Rash   Cyclobenzaprine Other (See Comments)   Loracarbef Rash      Metronidazole Other (See Comments)   Moxifloxacin Hcl In Nacl Anxiety   Penicillin G Sodium Rash        Medication List        Accurate as of May 26, 2023  2:50 PM. If you have any questions, ask your nurse or doctor.          Aerochamber Plus Device Use as directed   albuterol 108 (90 Base) MCG/ACT inhaler Commonly known as: VENTOLIN HFA Inhale 1-2 puffs into the lungs every 4 (four) hours as needed. For shortness for breath   ALPRAZolam 0.25 MG tablet Commonly known as: XANAX Take 1 tablet (0.25 mg total) by mouth 2 (two) times daily as needed for anxiety.   Asmanex HFA 200 MCG/ACT Aero Generic drug: Mometasone Furoate Inhale 1 puff into the lungs in the morning and at bedtime.   BLACK ELDERBERRY PO Take 1 tablet by mouth daily.   Cetirizine HCl 10 MG Caps Take 10 mg by mouth as needed.   cholecalciferol 25 MCG (1000 UNIT) tablet Commonly known as: VITAMIN D3 Take 1,000 Units by mouth daily.   Dexcom G7 Sensor Misc 1 Device by Does not apply route as directed.   diclofenac Sodium 1 % Gel Commonly known as: VOLTAREN APPLY 2 GRAMS TO AFFECTED AREA 4 TIMES A DAY   EPINEPHrine 0.3 mg/0.3 mL Soaj injection Commonly known as: EPI-PEN as needed.   Insulin Pen Needle 32G X 4 MM Misc 1 Device by Does not apply route daily in the afternoon.   Lantus SoloStar 100 UNIT/ML Solostar Pen Generic drug: insulin glargine Inject 54 Units into the skin daily. What changed: how much to take   levalbuterol  1.25 MG/3ML nebulizer solution Commonly known as: XOPENEX Take 1.25 mg by nebulization as needed for wheezing.   metFORMIN 500 MG tablet Commonly known as: GLUCOPHAGE TAKE 2 TABLETS BY  MOUTH TWICE A DAY   NON FORMULARY Plexus Probio 5 dietary supplement   NON FORMULARY Plexus Slim Hunger Control weight management series   ondansetron 4 MG tablet Commonly known as: ZOFRAN TAKE 1 TABLET BY MOUTH EVERY 8 HOURS AS NEEDED FOR NAUSEA AND VOMITING   ondansetron 4 MG tablet Commonly known as: ZOFRAN Take 1 tablet (4 mg total) by mouth every 8 (eight) hours as needed for nausea or vomiting.   OneTouch Verio Reflect w/Device Kit USE TO TEST BLOOD SUGAR ONCE DAILY IN THE EVENING   OneTouch Verio test strip Generic drug: glucose blood 1 each by Other route 2 (two) times daily. Use as instructed   predniSONE 10 MG tablet Commonly known as: DELTASONE 3 tabs x3 days and then 2 tabs x3 days and then 1 tab x3 days.  Take w/ food.   promethazine-dextromethorphan 6.25-15 MG/5ML syrup Commonly known as: PROMETHAZINE-DM Take 5 mLs by mouth 4 (four) times daily as needed for cough.   RABEprazole 20 MG tablet Commonly known as: ACIPHEX Take 1 tablet (20 mg total) by mouth 2 (two) times daily.   rosuvastatin 5 MG tablet Commonly known as: Crestor Take 1 tablet (5 mg total) by mouth daily.         ALLERGIES: Allergies  Allergen Reactions   Fluticasone-Salmeterol Palpitations   Morphine Palpitations and Other (See Comments)    REACTION: heart racing   Peanut Oil Other (See Comments) and Cough    congested   Peanut-Containing Drug Products Cough   Sulfa Antibiotics Other (See Comments)    REACTION: takes away appetite, makes her feel nervous, jittery   Other Other (See Comments)    Oats and Green peppers---headaches   Penicillins Rash   Sulfamethoxazole Nausea Only   Sulfur Other (See Comments)    "out of her body"   Montelukast    Pioglitazone     Joint pain    Ciprofloxacin Anxiety   Clindamycin Rash   Cyclobenzaprine Other (See Comments)   Loracarbef Rash        Metronidazole Other (See Comments)   Moxifloxacin Hcl In Nacl Anxiety   Penicillin G Sodium Rash     REVIEW OF SYSTEMS: A comprehensive ROS was conducted with the patient and is negative except as per HPI    OBJECTIVE:   VITAL SIGNS: BP 120/70 (BP Location: Left Arm, Patient Position: Sitting, Cuff Size: Small)   Pulse (!) 110   Ht 5\' 1"  (1.549 m)   Wt 143 lb (64.9 kg)   SpO2 97%   BMI 27.02 kg/m    PHYSICAL EXAM:  General: Pt appears well and is in NAD  Lungs: Clear with good BS bilat with no rales, rhonchi, or wheezes  Heart: RRR   Extremities:  Lower extremities - No pretibial edema.   Neuro: MS is good with appropriate affect, pt is alert and Ox3   DM Foot Exam 10/10/2022  The skin of the feet is intact without sores or ulcerations. The pedal pulses are 2+ on right and 2+ on left. The sensation is intact to a screening 5.07, 10 gram monofilament bilaterally   DATA REVIEWED:  Lab Results  Component Value Date   HGBA1C 7.1 (A) 10/10/2022   HGBA1C 7.9 (H) 06/20/2022   HGBA1C 9.3 (H) 02/21/2022    Latest Reference Range & Units 10/19/21 11:51  Sodium 134 - 144 mmol/L 140  Potassium 3.5 - 5.2 mmol/L 4.7  Chloride 96 - 106 mmol/L 102  CO2 20 - 29 mmol/L  22  Glucose 70 - 99 mg/dL 161 (H)  BUN 8 - 27 mg/dL 9  Creatinine 0.96 - 0.45 mg/dL 4.09  Calcium 8.7 - 81.1 mg/dL 9.8  BUN/Creatinine Ratio 12 - 28  14  eGFR >59 mL/min/1.73 98  Alkaline Phosphatase 44 - 121 IU/L 98  Albumin 3.9 - 4.9 g/dL 4.8  Albumin/Globulin Ratio 1.2 - 2.2  1.8  AST 0 - 40 IU/L 52 (H)  ALT 0 - 32 IU/L 67 (H)  Total Protein 6.0 - 8.5 g/dL 7.5  Total Bilirubin 0.0 - 1.2 mg/dL 0.4  Total CHOL/HDL Ratio 0.0 - 4.4 ratio 5.9 (H)  Cholesterol, Total 100 - 199 mg/dL 914 (H)  HDL Cholesterol >39 mg/dL 35 (L)  Triglycerides 0 - 149 mg/dL 782 (H)  VLDL Cholesterol Cal 5 - 40 mg/dL 49 (H)   LDL Chol Calc (NIH) 0 - 99 mg/dL 956 (H)     In office BG 101 mg/dL   ASSESSMENT / PLAN / RECOMMENDATIONS:   1) Type 2 Diabetes Mellitus, Sub-Optimally controlled, With retinopathic  complications - Most recent A1c of 7.8 %. Goal A1c < 7.0 %.    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 -She also received glucocorticoids for influenza/URI -She has history of chronic chronic GI issues since her colon cancer treatment, her brother is on Ozempic she initially requested to try it, but became skeptical about side effects -She stopped pioglitazone due to severe joint pains -We have opted to remain on current regimen, we entertain the idea of SGLT2 inhibitors or sulfonylureas if glycemic control does not improve   MEDICATIONS:   Continue Lantus 60 units daily Continue metformin 500 mg, 2 tabs twice daily  EDUCATION / INSTRUCTIONS: BG monitoring instructions: Patient is instructed to check her blood sugars 2 times a day. Call [facility] if: BG persistently < 70  I reviewed the Rule of 15 for the treatment of hypoglycemia in detail with the patient. Literature supplied.   2) Diabetic complications:  Eye: Does  have known diabetic retinopathy.  Neuro/ Feet: Does not have known diabetic peripheral neuropathy. Renal: Patient does not at have known baseline CKD. She is  on an ACEI/ARB at present.  3) Dyslipidemia:   -LDL above goal at 124 mg/DL, she was prescribed Crestor by her PCP but developed arthralgias    Follow-up in 4 months  Signed electronically by: Lyndle Herrlich, MD  [facility]  [facility] [address] [place] [place], [place] [address] Phone: [phone] FAX: [phone]   CC: Junie Spencer, FNP [address] [place] [address] Phone: [phone]  Fax: [phone]    Return to  Endocrinology clinic as below: Future Appointments  Date Time Provider Department Center  06/19/2023 10:00 AM [facility] None  06/19/2023 10:30 AM Josph Macho, MD [facility] None  07/25/2023 11:00 AM Mare Loan, RD [facility] None  11/23/2023 10:40 AM [facility]NUAL WELLNESS VISIT [facility] None

## 2023-05-27 LAB — MICROALBUMIN / CREATININE URINE RATIO
Creatinine, Urine: 26 mg/dL (ref 20–275)
Microalb, Ur: <0.2 mg/dL

## 2023-05-29 ENCOUNTER — Encounter: Payer: Self-pay | Admitting: Internal Medicine

## 2023-06-19 ENCOUNTER — Ambulatory Visit: Payer: Medicare Other | Admitting: Hematology & Oncology

## 2023-06-19 ENCOUNTER — Other Ambulatory Visit: Payer: Medicare Other

## 2023-07-13 ENCOUNTER — Other Ambulatory Visit: Payer: Self-pay

## 2023-07-13 DIAGNOSIS — C182 Malignant neoplasm of ascending colon: Secondary | ICD-10-CM

## 2023-07-14 ENCOUNTER — Inpatient Hospital Stay: Admitting: Hematology & Oncology

## 2023-07-14 ENCOUNTER — Other Ambulatory Visit: Payer: Self-pay

## 2023-07-14 ENCOUNTER — Inpatient Hospital Stay: Attending: Hematology & Oncology

## 2023-07-14 ENCOUNTER — Encounter: Payer: Self-pay | Admitting: Hematology & Oncology

## 2023-07-14 VITALS — BP 153/73 | HR 103 | Temp 97.8°F | Resp 18 | Wt 144.0 lb

## 2023-07-14 DIAGNOSIS — E119 Type 2 diabetes mellitus without complications: Secondary | ICD-10-CM | POA: Insufficient documentation

## 2023-07-14 DIAGNOSIS — Z881 Allergy status to other antibiotic agents status: Secondary | ICD-10-CM | POA: Diagnosis not present

## 2023-07-14 DIAGNOSIS — Z885 Allergy status to narcotic agent status: Secondary | ICD-10-CM | POA: Insufficient documentation

## 2023-07-14 DIAGNOSIS — K219 Gastro-esophageal reflux disease without esophagitis: Secondary | ICD-10-CM

## 2023-07-14 DIAGNOSIS — Z79899 Other long term (current) drug therapy: Secondary | ICD-10-CM | POA: Insufficient documentation

## 2023-07-14 DIAGNOSIS — C182 Malignant neoplasm of ascending colon: Secondary | ICD-10-CM | POA: Diagnosis not present

## 2023-07-14 DIAGNOSIS — Z882 Allergy status to sulfonamides status: Secondary | ICD-10-CM | POA: Insufficient documentation

## 2023-07-14 DIAGNOSIS — Z880 Allergy status to penicillin: Secondary | ICD-10-CM | POA: Diagnosis not present

## 2023-07-14 DIAGNOSIS — E538 Deficiency of other specified B group vitamins: Secondary | ICD-10-CM

## 2023-07-14 LAB — CBC WITH DIFFERENTIAL (CANCER CENTER ONLY)
Abs Immature Granulocytes: 0.14 10*3/uL — ABNORMAL HIGH (ref 0.00–0.07)
Basophils Absolute: 0.1 10*3/uL (ref 0.0–0.1)
Basophils Relative: 1 %
Eosinophils Absolute: 0.4 10*3/uL (ref 0.0–0.5)
Eosinophils Relative: 5 %
HCT: 37.6 % (ref 36.0–46.0)
Hemoglobin: 12.4 g/dL (ref 12.0–15.0)
Immature Granulocytes: 2 %
Lymphocytes Relative: 27 %
Lymphs Abs: 2.4 10*3/uL (ref 0.7–4.0)
MCH: 29.6 pg (ref 26.0–34.0)
MCHC: 33 g/dL (ref 30.0–36.0)
MCV: 89.7 fL (ref 80.0–100.0)
Monocytes Absolute: 0.6 10*3/uL (ref 0.1–1.0)
Monocytes Relative: 6 %
Neutro Abs: 5.5 10*3/uL (ref 1.7–7.7)
Neutrophils Relative %: 59 %
Platelet Count: 202 10*3/uL (ref 150–400)
RBC: 4.19 MIL/uL (ref 3.87–5.11)
RDW: 13 % (ref 11.5–15.5)
WBC Count: 9.1 10*3/uL (ref 4.0–10.5)
nRBC: 0 % (ref 0.0–0.2)

## 2023-07-14 LAB — CMP (CANCER CENTER ONLY)
ALT: 51 U/L — ABNORMAL HIGH (ref 0–44)
AST: 43 U/L — ABNORMAL HIGH (ref 15–41)
Albumin: 4.8 g/dL (ref 3.5–5.0)
Alkaline Phosphatase: 71 U/L (ref 38–126)
Anion gap: 12 (ref 5–15)
BUN: 14 mg/dL (ref 8–23)
CO2: 26 mmol/L (ref 22–32)
Calcium: 10 mg/dL (ref 8.9–10.3)
Chloride: 102 mmol/L (ref 98–111)
Creatinine: 0.79 mg/dL (ref 0.44–1.00)
GFR, Estimated: >60 mL/min (ref >60)
Glucose, Bld: 161 mg/dL — ABNORMAL HIGH (ref 70–99)
Potassium: 4.5 mmol/L (ref 3.5–5.1)
Sodium: 140 mmol/L (ref 135–145)
Total Bilirubin: 0.4 mg/dL (ref 0.0–1.2)
Total Protein: 7.7 g/dL (ref 6.5–8.1)

## 2023-07-14 LAB — CEA (ACCESS): CEA (CHCC): 3.29 ng/mL (ref 0.00–5.00)

## 2023-07-14 LAB — LACTATE DEHYDROGENASE: LDH: 131 U/L (ref 98–192)

## 2023-07-14 LAB — FERRITIN: Ferritin: 62 ng/mL (ref 11–307)

## 2023-07-14 NOTE — Progress Notes (Signed)
 Hematology and Oncology Follow Up Visit  Stephanie Carroll [id number] [birth year] 68 y.o. 07/14/2023   Principle Diagnosis:  Stage II carcinoma of colon   Current Therapy:        Observation   Interim History:  Stephanie Carroll is here today for follow-up.  Is been a year since we last saw her.  She been doing okay.  Unfortunately, her mom has had chronic pancreatitis.  I think she was in the hospital for 3 months.  She has recovered.  It has been a very slow recovery.  Today is Stephanie Carroll's birthday.  I am very happy for her.  Hopefully, she will be able to celebrate.  She is still dealing with her diabetes.  She is try to watch her hemoglobin A1c.  She has had no problems with bleeding or bruising.    I think her last mammogram was back in December.  She is still dealing with arthritis.  She has had no problems with nausea or vomiting.  She thinks the she may have had COVID earlier in the Spring.  Of note, her last CEA level was 2.87.  Currently, I would have said that her performance status is probably ECOG 1.  She  Medications:  Allergies as of 07/14/2023       Reactions   Fluticasone-salmeterol Palpitations   Morphine Palpitations, Other (See Comments)   REACTION: heart racing   Peanut Oil Other (See Comments), Cough   congested   Peanut-containing Drug Products Cough   Sulfa Antibiotics Other (See Comments)   REACTION: takes away appetite, makes her feel nervous, jittery   Other Other (See Comments)   Oats and Green peppers---headaches   Penicillins Rash   Sulfamethoxazole Nausea Only   Sulfur Other (See Comments)   "out of her body"   Montelukast    Pioglitazone     Joint pain   Ciprofloxacin Anxiety   Clindamycin Rash   Cyclobenzaprine Other (See Comments)   Loracarbef Rash      Metronidazole Other (See Comments)   Moxifloxacin Hcl In Nacl Anxiety   Penicillin G Sodium Rash        Medication List        Accurate as of Jul 14, 2023  3:50 PM. If you have any  questions, ask your nurse or doctor.          STOP taking these medications    ALPRAZolam  0.25 MG tablet Commonly known as: XANAX  Stopped by: Ivor Mars   ondansetron  4 MG tablet Commonly known as: ZOFRAN  Stopped by: Shatia Sindoni R Jasmine Maceachern   predniSONE  10 MG tablet Commonly known as: DELTASONE  Stopped by: Ivor Mars   promethazine -dextromethorphan 6.25-15 MG/5ML syrup Commonly known as: PROMETHAZINE -DM Stopped by: Kareli Hossain R Taheera Thomann   rosuvastatin  5 MG tablet Commonly known as: Crestor  Stopped by: Ivor Mars       TAKE these medications    Aerochamber Plus Device Use as directed   albuterol  108 (90 Base) MCG/ACT inhaler Commonly known as: VENTOLIN  HFA Inhale 1-2 puffs into the lungs every 4 (four) hours as needed. For shortness for breath   Asmanex  HFA 200 MCG/ACT Aero Generic drug: Mometasone Furoate  Inhale 1 puff into the lungs in the morning and at bedtime.   BLACK ELDERBERRY PO Take 1 tablet by mouth daily.   Cetirizine HCl 10 MG Caps Take 10 mg by mouth as needed.   cholecalciferol 25 MCG (1000 UNIT) tablet Commonly known as: VITAMIN D3 Take 1,000 Units by mouth daily.  Dexcom G7 Sensor Misc 1 Device by Does not apply route as directed.   diclofenac  Sodium 1 % Gel Commonly known as: VOLTAREN  APPLY 2 GRAMS TO AFFECTED AREA 4 TIMES A DAY   EPINEPHrine  0.3 mg/0.3 mL Soaj injection Commonly known as: EPI-PEN as needed.   Insulin  Pen Needle 32G X 4 MM Misc 1 Device by Does not apply route daily in the afternoon.   Lantus  SoloStar 100 UNIT/ML Solostar Pen Generic drug: insulin  glargine Inject 54 Units into the skin daily. What changed: how much to take   levalbuterol 1.25 MG/3ML nebulizer solution Commonly known as: XOPENEX Take 1.25 mg by nebulization as needed for wheezing.   metFORMIN  500 MG tablet Commonly known as: GLUCOPHAGE  TAKE 2 TABLETS BY MOUTH TWICE A DAY   NON FORMULARY Plexus Probio 5 dietary supplement   NON  FORMULARY Plexus Slim Hunger Control weight management series   OneTouch Verio Reflect w/Device Kit USE TO TEST BLOOD SUGAR ONCE DAILY IN THE EVENING   OneTouch Verio test strip Generic drug: glucose blood 1 each by Other route 2 (two) times daily. Use as instructed   RABEprazole  20 MG tablet Commonly known as: ACIPHEX  Take 1 tablet (20 mg total) by mouth 2 (two) times daily.        Allergies:  Allergies  Allergen Reactions   Fluticasone-Salmeterol Palpitations   Morphine Palpitations and Other (See Comments)    REACTION: heart racing   Peanut Oil Other (See Comments) and Cough    congested   Peanut-Containing Drug Products Cough   Sulfa Antibiotics Other (See Comments)    REACTION: takes away appetite, makes her feel nervous, jittery   Other Other (See Comments)    Oats and Green peppers---headaches   Penicillins Rash   Sulfamethoxazole Nausea Only   Sulfur Other (See Comments)    "out of her body"   Montelukast    Pioglitazone      Joint pain   Ciprofloxacin Anxiety   Clindamycin Rash   Cyclobenzaprine Other (See Comments)   Loracarbef Rash        Metronidazole Other (See Comments)   Moxifloxacin Hcl In Nacl Anxiety   Penicillin G Sodium Rash    Past Medical History, Surgical history, Social history, and Family History were reviewed and updated.  Review of Systems: All other 10 point review of systems is negative.   Physical Exam:  weight is 144 lb (65.3 kg). Her oral temperature is 97.8 F (36.6 C). Her blood pressure is 153/73 (abnormal) and her pulse is 103 (abnormal). Her respiration is 18 and oxygen saturation is 98%.   Wt Readings from Last 3 Encounters:  07/14/23 144 lb (65.3 kg)  05/26/23 143 lb (64.9 kg)  05/03/23 140 lb 8 oz (63.7 kg)    Ocular: Sclerae unicteric, pupils equal, round and reactive to light Ear-nose-throat: Oropharynx clear, dentition fair Lymphatic: No cervical or supraclavicular adenopathy Lungs no rales or rhonchi, good  excursion bilaterally Heart regular rate and rhythm, no murmur appreciated Abd soft, nontender, positive bowel sounds MSK no focal spinal tenderness, no joint edema Neuro: non-focal, well-oriented, appropriate affect Breasts: Deferred  Lab Results  Component Value Date   WBC 9.1 07/14/2023   HGB 12.4 07/14/2023   HCT 37.6 07/14/2023   MCV 89.7 07/14/2023   PLT 202 07/14/2023   Lab Results  Component Value Date   FERRITIN 81 06/17/2022   IRON 63 06/17/2022   TIBC 371 06/17/2022   UIBC 308 06/17/2022   IRONPCTSAT 17 06/17/2022  Lab Results  Component Value Date   RETICCTPCT 2.0 03/01/2007   RBC 4.19 07/14/2023   RETICCTABS 101.4 03/01/2007   No results found for: "KPAFRELGTCHN", "LAMBDASER", "KAPLAMBRATIO" Lab Results  Component Value Date   IGA 371 06/19/2018   No results found for: "TOTALPROTELP", "ALBUMINELP", "A1GS", "A2GS", "BETS", "BETA2SER", "GAMS", "MSPIKE", "SPEI"   Chemistry      Component Value Date/Time   NA 139 10/27/2022 1239   NA 141 11/29/2013 1454   K 4.7 10/27/2022 1239   K 4.1 04/22/2016 1456   K 3.8 11/29/2013 1454   CL 100 10/27/2022 1239   CL 95 (L) 04/22/2016 1456   CL 98 11/29/2013 1454   CO2 22 10/27/2022 1239   CO2 21 04/22/2016 1456   CO2 25 11/29/2013 1454   BUN 12 10/27/2022 1239   BUN 12 11/29/2013 1454   CREATININE 0.73 10/27/2022 1239   CREATININE 0.82 06/17/2022 1027   CREATININE 0.55 (L) 04/22/2016 1456   CREATININE 0.8 11/29/2013 1454      Component Value Date/Time   CALCIUM  9.8 10/27/2022 1239   CALCIUM  9.5 04/22/2016 1456   CALCIUM  9.2 11/29/2013 1454   ALKPHOS 83 10/27/2022 1239   ALKPHOS 103 04/22/2016 1456   ALKPHOS 93 (H) 11/29/2013 1454   AST 52 (H) 10/27/2022 1239   AST 37 06/17/2022 1027   ALT 56 (H) 10/27/2022 1239   ALT 44 06/17/2022 1027   ALT 36 11/29/2013 1454   BILITOT 0.4 10/27/2022 1239   BILITOT 0.4 06/17/2022 1027       Impression and Plan: Ms. Goleman is a very pleasant 68 yo caucasian female  with history of 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 She did not require any adjuvant therapy.   She continues to do well and so far there has been no evidence of recurrence.   She likes come back to see us .  She feels confident with our examinations and with her lab work.  We will plan to get her back in 1 year.  Ivor Mars, MD 5/30/20253:50 PM

## 2023-07-17 ENCOUNTER — Ambulatory Visit: Payer: Self-pay | Admitting: Hematology & Oncology

## 2023-07-17 LAB — IRON AND IRON BINDING CAPACITY (CC-WL,HP ONLY)
Iron: 49 ug/dL (ref 28–170)
Saturation Ratios: 12 % (ref 10.4–31.8)
TIBC: 407 ug/dL (ref 250–450)
UIBC: 358 ug/dL (ref 148–442)

## 2023-07-18 ENCOUNTER — Other Ambulatory Visit: Payer: Self-pay | Admitting: Family

## 2023-07-25 ENCOUNTER — Encounter: Payer: Medicare Other | Attending: Family | Admitting: Nutrition

## 2023-07-25 ENCOUNTER — Encounter: Payer: Self-pay | Admitting: Nutrition

## 2023-07-25 VITALS — Ht 61.0 in | Wt 145.0 lb

## 2023-07-25 DIAGNOSIS — E559 Vitamin D deficiency, unspecified: Secondary | ICD-10-CM | POA: Insufficient documentation

## 2023-07-25 DIAGNOSIS — E538 Deficiency of other specified B group vitamins: Secondary | ICD-10-CM | POA: Diagnosis not present

## 2023-07-25 DIAGNOSIS — E119 Type 2 diabetes mellitus without complications: Secondary | ICD-10-CM | POA: Diagnosis not present

## 2023-07-25 DIAGNOSIS — I152 Hypertension secondary to endocrine disorders: Secondary | ICD-10-CM | POA: Diagnosis not present

## 2023-07-25 DIAGNOSIS — E1159 Type 2 diabetes mellitus with other circulatory complications: Secondary | ICD-10-CM | POA: Diagnosis not present

## 2023-07-25 DIAGNOSIS — E1169 Type 2 diabetes mellitus with other specified complication: Secondary | ICD-10-CM | POA: Insufficient documentation

## 2023-07-25 DIAGNOSIS — Z794 Long term (current) use of insulin: Secondary | ICD-10-CM | POA: Insufficient documentation

## 2023-07-25 DIAGNOSIS — E785 Hyperlipidemia, unspecified: Secondary | ICD-10-CM | POA: Insufficient documentation

## 2023-07-25 DIAGNOSIS — K760 Fatty (change of) liver, not elsewhere classified: Secondary | ICD-10-CM | POA: Diagnosis not present

## 2023-07-25 NOTE — Patient Instructions (Addendum)
 Goals Increase water to 4 bottles of water per day Add protein with tomato sandwiches Increase vegetables with lunch and dinner Get back to walking 30 minutes a day Get A1C down to 7% Increase iron rich foods

## 2023-07-25 NOTE — Progress Notes (Signed)
 Medical Nutrition Therapy  Appointment Start time:  23  Appointment End time: 1100  Primary concerns today: Dm Type 2 Referral diagnosis: E11.8 Preferred learning style: No preference  Learning readiness: Ready   NUTRITION ASSESSMENT  DM Follow up  A1C 7.8%.  Metformin  500 mg BID, 60 units of Lantus  per day Didn't bring meter. Had been walking but had to stop due to swollen foot. Ready to get back to walking. FBS 120's  Time blood sugars 200-250's at night. Has been going to the [employer] in [place]. Eating more fruits and vegetables.  Feels like her clothes are fitting looser. NO weight loss. Doesn't want GLP medication due to history of stomach surgery's, colon cancer. Get GI MD didn't recommend it due to her GI issues. Is willing to work on getting it down with lifestyle changes with walking, increasing water intake and watching portion sizes of fruit. Liver enzymes improved slightly.  Sees Dr. Aldona Amel, Endocrinology in a month.  Wt Readings from Last 3 Encounters:  07/25/23 145 lb (65.8 kg)  07/14/23 144 lb (65.3 kg)  05/26/23 143 lb (64.9 kg)   Ht Readings from Last 3 Encounters:  07/25/23 5\' 1"  (1.549 m)  05/26/23 5\' 1"  (1.549 m)  05/03/23 5\' 1"  (1.549 m)   Body mass index is 27.4 kg/m. @BMIFA @ Facility age limit for growth %iles is 20 years. Facility age limit for growth %iles is 20 years.      Latest Ref Rng & Units 07/14/2023    3:25 PM 10/27/2022   12:39 PM 06/17/2022   10:27 AM  CMP  Glucose 70 - 99 mg/dL 960  454  098   BUN 8 - 23 mg/dL 14  12  15    Creatinine 0.44 - 1.00 mg/dL 1.19  1.47  8.29   Sodium 135 - 145 mmol/L 140  139  139   Potassium 3.5 - 5.1 mmol/L 4.5  4.7  4.4   Chloride 98 - 111 mmol/L 102  100  99   CO2 22 - 32 mmol/L 26  22  28    Calcium  8.9 - 10.3 mg/dL 56.2  9.8  13.0   Total Protein 6.5 - 8.1 g/dL 7.7  7.3  8.2   Total Bilirubin 0.0 - 1.2 mg/dL 0.4  0.4  0.4   Alkaline Phos 38 - 126 U/L 71  83  88   AST 15 - 41 U/L 43   52  37   ALT 0 - 44 U/L 51  56  44    Lipid Panel  Lab Results  Component Value Date   HGBA1C 7.8 (A) 05/26/2023   Clinical Medical Hx: See chart Medications: Lantus  66 units. Labs:  Lab Results  Component Value Date   HGBA1C 7.8 (A) 05/26/2023      Latest Ref Rng & Units 07/14/2023    3:25 PM 10/27/2022   12:39 PM 06/17/2022   10:27 AM  CMP  Glucose 70 - 99 mg/dL 865  784  696   BUN 8 - 23 mg/dL 14  12  15    Creatinine 0.44 - 1.00 mg/dL 2.95  2.84  1.32   Sodium 135 - 145 mmol/L 140  139  139   Potassium 3.5 - 5.1 mmol/L 4.5  4.7  4.4   Chloride 98 - 111 mmol/L 102  100  99   CO2 22 - 32 mmol/L 26  22  28    Calcium  8.9 - 10.3 mg/dL 44.0  9.8  10.2  Total Protein 6.5 - 8.1 g/dL 7.7  7.3  8.2   Total Bilirubin 0.0 - 1.2 mg/dL 0.4  0.4  0.4   Alkaline Phos 38 - 126 U/L 71  83  88   AST 15 - 41 U/L 43  52  37   ALT 0 - 44 U/L 51  56  44    Lipid Panel     Component Value Date/Time   CHOL 206 (H) 10/27/2022 1239   TRIG 226 (H) 10/27/2022 1239   HDL 37 (L) 10/27/2022 1239   CHOLHDL 5.6 (H) 10/27/2022 1239   CHOLHDL 6 03/08/2019 1603   VLDL 59.8 (H) 03/08/2019 1603   LDLCALC 129 (H) 10/27/2022 1239   LDLDIRECT 142.0 03/08/2019 1603   LABVLDL 40 10/27/2022 1239    Notable Signs/Symptoms: none  Lifestyle & Dietary Hx Lives with her husband. Eats 3 meals per day usually.  Estimated daily fluid intake: 40 oz Supplements: Elderberry, Vit D, Plexus supplement Sleep: good Stress / self-care:  Current average weekly physical activity: walks  24-Hr Dietary Recall B) Canteloupe L Grilled chicken sandwich and canteloupe D) Tomato Sandwich   Calories: 1200 Carbohydrate: 135g Protein: 90g Fat: 33g   NUTRITION DIAGNOSIS  NB-1.1 Food and nutrition-related knowledge deficit As related to Diabetes Type 2.  As evidenced by A1C 7.9%..   NUTRITION INTERVENTION  Nutrition education (E-1) on the following topics:  Iron rich foods and foods high in fiber  Lifestyle  Medicine  - Whole Food, Plant Predominant Nutrition is highly recommended: Eat Plenty of vegetables, Mushrooms, fruits, Legumes, Whole Grains, Nuts, seeds in lieu of processed meats, processed snacks/pastries red meat, poultry, eggs.    -It is better to avoid simple carbohydrates including: Cakes, Sweet Desserts, Ice Cream, Soda (diet and regular), Sweet Tea, Candies, Chips, Cookies, Store Bought Juices, Alcohol in Excess of  1-2 drinks a day, Lemonade,  Artificial Sweeteners, Doughnuts, Coffee Creamers, "Sugar-free" Products, etc, etc.  This is not a complete list.....  Exercise: If you are able: 30 -60 minutes a day ,4 days a week, or 150 minutes a week.  The longer the better.  Combine stretch, strength, and aerobic activities.  If you were told in the past that you have high risk for cardiovascular diseases, you may seek evaluation by your heart doctor prior to initiating moderate to intense exercise programs.   Handouts Provided Include  Lifestyle medicine handouts Hypoglycemia handout  Learning Style & Readiness for Change Teaching method utilized: Visual & Auditory  Demonstrated degree of understanding via: Teach Back  Barriers to learning/adherence to lifestyle change: none  Goals Established by Pt Goals Increase water to 4 bottles of water per day Add protein with tomato sandwiches Increase vegetables with lunch and dinner Get back to walking 30 minutes a day Get A1C down to 7% Increase iron rich foods   MONITORING & EVALUATION Dietary intake, weekly physical activity, and blood sugars in 1 month.  Next Steps  Patient is to work on meal prepping and planning.

## 2023-07-27 ENCOUNTER — Inpatient Hospital Stay: Attending: Hematology & Oncology

## 2023-07-27 VITALS — BP 136/66 | HR 100 | Temp 97.7°F | Resp 18

## 2023-07-27 DIAGNOSIS — Z79899 Other long term (current) drug therapy: Secondary | ICD-10-CM | POA: Diagnosis not present

## 2023-07-27 DIAGNOSIS — K648 Other hemorrhoids: Secondary | ICD-10-CM

## 2023-07-27 DIAGNOSIS — C182 Malignant neoplasm of ascending colon: Secondary | ICD-10-CM | POA: Insufficient documentation

## 2023-07-27 DIAGNOSIS — K625 Hemorrhage of anus and rectum: Secondary | ICD-10-CM

## 2023-07-27 MED ORDER — FAMOTIDINE IN NACL 20-0.9 MG/50ML-% IV SOLN
20.0000 mg | Freq: Once | INTRAVENOUS | Status: AC
  Administered 2023-07-27: 20 mg via INTRAVENOUS
  Filled 2023-07-27: qty 50

## 2023-07-27 MED ORDER — ACETAMINOPHEN 325 MG PO TABS
650.0000 mg | ORAL_TABLET | Freq: Once | ORAL | Status: AC
  Administered 2023-07-27: 650 mg via ORAL
  Filled 2023-07-27: qty 2

## 2023-07-27 MED ORDER — SODIUM CHLORIDE 0.9 % IV SOLN
510.0000 mg | Freq: Once | INTRAVENOUS | Status: AC
  Administered 2023-07-27: 510 mg via INTRAVENOUS
  Filled 2023-07-27: qty 17

## 2023-07-27 MED ORDER — LORATADINE 10 MG PO TABS
10.0000 mg | ORAL_TABLET | Freq: Once | ORAL | Status: AC
  Administered 2023-07-27: 10 mg via ORAL
  Filled 2023-07-27: qty 1

## 2023-07-27 MED ORDER — SODIUM CHLORIDE 0.9 % IV SOLN: INTRAVENOUS | Status: DC

## 2023-07-27 NOTE — Patient Instructions (Signed)

## 2023-07-28 ENCOUNTER — Telehealth: Payer: Self-pay | Admitting: *Deleted

## 2023-07-28 ENCOUNTER — Other Ambulatory Visit: Payer: Self-pay | Admitting: Family

## 2023-07-28 NOTE — Telephone Encounter (Signed)
 Message received from patient stating that she had Feraheme yesterday and has had diarrhea, indigestion and slight SOB during the night and would like to know what she can take. Hezekiah Louis NP notified. Call placed back to patient and patient notified per order of S. [place] Hammans NP to take Pepcid  40 mg po and Benadryl  25 mg PO now and to go to the ER immediately if SOB continues.  Pt states that she has already taken Aciphex  and Tums this morning, but will take a Benadryl  when she gets home.  Pt verbalizes an understanding to go to the ER immediately if SOB continues.  Pt states that she feels as though the SOB is from her indigestion and that it feels slightly better at this time.  Pt is appreciative of call back and has no further questions or concerns at this time.  Per Hezekiah Louis NP, pre meds will be added prior to pt.'s Feraheme infusion next week.

## 2023-07-31 ENCOUNTER — Other Ambulatory Visit: Payer: Self-pay

## 2023-07-31 ENCOUNTER — Telehealth: Payer: Self-pay

## 2023-07-31 DIAGNOSIS — D500 Iron deficiency anemia secondary to blood loss (chronic): Secondary | ICD-10-CM

## 2023-07-31 MED ORDER — FERROUS GLUCONATE 324 (38 FE) MG PO TABS: 324.0000 mg | ORAL_TABLET | Freq: Every day | ORAL | 3 refills | Status: AC

## 2023-07-31 NOTE — Telephone Encounter (Signed)
 Received phone call from patient stating she does not want to get her second iron infusion as she felt so bad over the weekend. Pt states she feels better today but would like to try oral iron before getting another infusion. Dr. Maria Shiner aware and agreeable to this plan. Prescription sent to patient preferred pharmacy and iron infusion appointment cancelled. Pt appreciative of help and had no further needs.

## 2023-08-03 ENCOUNTER — Ambulatory Visit

## 2023-08-06 ENCOUNTER — Other Ambulatory Visit: Payer: Self-pay | Admitting: Internal Medicine

## 2023-08-21 ENCOUNTER — Encounter: Payer: Self-pay | Admitting: *Deleted

## 2023-08-22 ENCOUNTER — Telehealth: Payer: Self-pay | Admitting: *Deleted

## 2023-08-22 NOTE — Telephone Encounter (Signed)
 This nurse returned patient's phone call regarding side effects of oral iron. I told her that I had sent her a MyChart message yesterday with the new iron pill Lauraine Pepper, NP, wanted her to switch too. She stated,I'm going to wait a little while and see if the fatigue and joint pain stop. I think it is coming from the iron pills and the iron infusion I had.

## 2023-08-24 ENCOUNTER — Encounter: Payer: Self-pay | Admitting: Family

## 2023-08-25 ENCOUNTER — Ambulatory Visit: Admitting: Family Medicine

## 2023-08-25 ENCOUNTER — Ambulatory Visit: Payer: Self-pay

## 2023-08-25 ENCOUNTER — Encounter: Payer: Self-pay | Admitting: Family Medicine

## 2023-08-25 VITALS — BP 158/81 | HR 105 | Temp 97.7°F | Ht 61.0 in | Wt 142.0 lb

## 2023-08-25 DIAGNOSIS — S30861A Insect bite (nonvenomous) of abdominal wall, initial encounter: Secondary | ICD-10-CM | POA: Diagnosis not present

## 2023-08-25 DIAGNOSIS — M25562 Pain in left knee: Secondary | ICD-10-CM

## 2023-08-25 DIAGNOSIS — D500 Iron deficiency anemia secondary to blood loss (chronic): Secondary | ICD-10-CM | POA: Diagnosis not present

## 2023-08-25 DIAGNOSIS — M25561 Pain in right knee: Secondary | ICD-10-CM

## 2023-08-25 DIAGNOSIS — W57XXXA Bitten or stung by nonvenomous insect and other nonvenomous arthropods, initial encounter: Secondary | ICD-10-CM | POA: Diagnosis not present

## 2023-08-25 DIAGNOSIS — M2550 Pain in unspecified joint: Secondary | ICD-10-CM | POA: Diagnosis not present

## 2023-08-25 NOTE — Telephone Encounter (Signed)
 FYI Only or Action Required?: FYI only for provider.  Patient was last seen in primary care on 05/03/2023 by Mahlon Comer BRAVO, MD.  Called Nurse Triage reporting Tick Removal.  Symptoms began several weeks ago.  Interventions attempted: OTC medications: tylenol  and volteran gel.  Symptoms are: unchanged.  Triage Disposition: Home Care  Patient/caregiver understands and will follow disposition?: Yes       Copied from CRM [id number]. Topic: Clinical - Red Word Triage >> Aug 25, 2023  8:20 AM [place] E wrote: [facility] that prompted transfer to Nurse Triage: Tick bite on stomach from 3 weeks ago. Patient said a spot is still visible on abdomen. Reason for Disposition  Deer tick bite with no complications  Answer Assessment - Initial Assessment Questions 1. ATTACHED:  Is the tick still on the skin?  (e.g., yes, no, unsure)     no 3. ONSET - TICK NOT STILL ATTACHED: If the tick has been removed, how long do you think the tick was attached before you removed it? (e.g., 5 hours, 2 days). When was this?     3 week to one month 4. LOCATION: Where is the tick bite located? (e.g., arm, leg)     abd 5. TYPE of TICK: Is it a wood tick or a deer tick? (e.g., deer tick, wood tick; unsure)     deer 6. SIZE of TICK: How big is the tick? (e.g., size of poppy seed, apple seed, watermelon seed; unsure) Note: Deer ticks can be the size of a poppy seed (nymph) or an apple seed (adult).       Poppy seed and apple seed 7. ENGORGED: Did the tick look flat or engorged (full, swollen)? (e.g., flat, engorged; unsure)     flat 8. OTHER SYMPTOMS: Do you have any other symptoms? (e.g., fever, rash, redness at bite area, red ring around bite)     Itching, redness, knee pain  Protocols used: Tick Bite-A-AH

## 2023-08-25 NOTE — Telephone Encounter (Signed)
 Appointment scheduled.

## 2023-08-25 NOTE — Progress Notes (Signed)
 Subjective:  Patient ID: Stephanie Carroll, female    DOB: [birth year], 68 y.o.   MRN: [id number]  Patient Care Team: Lavell Bari LABOR, FNP as PCP - General (Family Medicine) Octavia, Charlie Hamilton, MD as Consulting Physician (Ophthalmology) Bonnielee Rayleen POUR, MD as Referring Physician (Obstetrics and Gynecology) Darlean Ozell NOVAK, MD as Consulting Physician (Pulmonary Disease)   Chief Complaint:  Knee Pain (Patient had iron infusion on 6/12, patient states that later that evening she had SOB, fatigue, and stomach trouble. She states that about a week before she found 2 ticks on her stomach. She is not sure if the knee pain is from the infusion or tick bites, she is scared it is from the ticks and wants to be tested for lymes.)   HPI: Stephanie Carroll is a 68 y.o. female presenting on 08/25/2023 for Knee Pain (Patient had iron infusion on 6/12, patient states that later that evening she had SOB, fatigue, and stomach trouble. She states that about a week before she found 2 ticks on her stomach. She is not sure if the knee pain is from the infusion or tick bites, she is scared it is from the ticks and wants to be tested for lymes.)   The patient, with liver disease, presents with knee pain and a reaction following an iron infusion.  She experienced difficulty breathing approximately six hours after receiving an iron infusion on July 27, 2023. This was her first significant reaction to an iron infusion, despite having had them in the past without issue. She stated that Pepcid  and Benadryl  are typically given as a precaution during iron infusions. She decided against proceeding with a scheduled follow-up infusion and is now taking oral iron supplements, specifically ferrous glycinate. The reaction left her feeling scared and required her to rest for three days. She has not experienced any kidney pain. Her oncologist informed her that her iron levels were low, prompting the initial iron infusion.  About a  month ago, she removed a couple of ticks from her abdomen. Since then, she has experienced pain in her knees, particularly the left knee, which has been more severe than the right. The pain extends to her hip and has improved slightly but remains significant, making it difficult to walk. No recent fever, chills, nausea, vomiting, or significant headaches, except for one day of headache. She has been applying ice and using topical treatments like Biofreeze and taking Tylenol  sparingly due to her liver disease.  She has a history of liver disease and is cautious with medication use, particularly Tylenol , due to potential liver impact.          Relevant past medical, surgical, family, and social history reviewed and updated as indicated.  Allergies and medications reviewed and updated. Data reviewed: Chart in Epic.   Past Medical History:  Diagnosis Date   Allergy    Arthritis    DDD   Asthma    Colon cancer (HCC) 2008   Diabetes mellitus    Elevated liver enzymes    GERD (gastroesophageal reflux disease)    History of thyroiditis 12/18/2007   Formatting of this note might be different from the original. Thyroiditis   Hyperlipidemia    Iron deficiency anemia due to chronic blood loss 09/17/2020   Thyroid  tumor, benign     Past Surgical History:  Procedure Laterality Date   BIOPSY THYROID      X2   CESAREAN SECTION     x 1   COLON  SURGERY     FOOT FRACTURE SURGERY Left 02/2015   INCISIONAL HERNIA REPAIR     NASAL SEPTUM SURGERY     PARTIAL HYSTERECTOMY     POLYPECTOMY     UPPER GASTROINTESTINAL ENDOSCOPY     WRIST GANGLION EXCISION     left    Social History   Socioeconomic History   Marital status: Married    Spouse name: Not on file   Number of children: 2   Years of education: Not on file   Highest education level: Not on file  Occupational History   Occupation: claims for [employer]    Employer: [employer]  Tobacco Use   Smoking status: Never   Smokeless tobacco:  Never   Tobacco comments:    never used product  Vaping Use   Vaping status: Never Used  Substance and Sexual Activity   Alcohol use: No    Alcohol/week: 0.0 standard drinks of alcohol   Drug use: No   Sexual activity: Not on file  Other Topics Concern   Not on file  Social History Narrative   Not on file   Social Drivers of Health   Financial Resource Strain: Low Risk  (11/22/2022)   Overall Financial Resource Strain (CARDIA)    Difficulty of Paying Living Expenses: Not hard at all  Food Insecurity: No Food Insecurity (06/28/2021)   [facility]   Hunger Vital Sign    Within the past 12 months, you worried that your food would run out before you got the money to buy more.: Never true    Within the past 12 months, the food you bought just didn't last and you didn't have money to get more.: Never true  Transportation Needs: No Transportation Needs (11/22/2022)   PRAPARE - Administrator, Civil Service (Medical): No    Lack of Transportation (Non-Medical): No  Physical Activity: Insufficiently Active (11/22/2022)   Exercise Vital Sign    Days of Exercise per Week: 7 days    Minutes of Exercise per Session: 20 min  Stress: No Stress Concern Present (11/22/2022)   [employer] of Occupational Health - Occupational Stress Questionnaire    Feeling of Stress : Not at all  Social Connections: Socially Integrated (11/22/2022)   Social Connection and Isolation Panel    Frequency of Communication with Friends and Family: More than three times a week    Frequency of Social Gatherings with Friends and Family: More than three times a week    Attends Religious Services: 1 to 4 times per year    Active Member of [employer] or Organizations: Yes    Attends Engineer, structural: More than 4 times per year    Marital Status: Married  Catering manager Violence: Not At Risk (08/17/2022)   Received from [facility]   HITS    Over the last 12 months how often did  your partner physically hurt you?: Never    Over the last 12 months how often did your partner insult you or talk down to you?: Never    Over the last 12 months how often did your partner threaten you with physical harm?: Never    Over the last 12 months how often did your partner scream or curse at you?: Never    Outpatient Encounter Medications as of 08/25/2023  Medication Sig   albuterol  (VENTOLIN  HFA) 108 (90 Base) MCG/ACT inhaler Inhale 1-2 puffs into the lungs every 4 (four) hours as needed. For  shortness for breath   BLACK ELDERBERRY PO Take 1 tablet by mouth daily.   Blood Glucose Monitoring Suppl (ONETOUCH VERIO REFLECT) w/Device KIT USE TO TEST BLOOD SUGAR ONCE DAILY IN THE EVENING   Cetirizine HCl 10 MG CAPS Take 10 mg by mouth as needed.   cholecalciferol (VITAMIN D ) 25 MCG (1000 UT) tablet Take 1,000 Units by mouth daily.   Continuous Glucose Sensor (DEXCOM G7 SENSOR) MISC 1 Device by Does not apply route as directed.   diclofenac  Sodium (VOLTAREN ) 1 % GEL APPLY 2 GRAMS TO AFFECTED AREA 4 TIMES A DAY   EPINEPHrine  0.3 mg/0.3 mL IJ SOAJ injection as needed.   ferrous gluconate  (FERGON) 324 MG tablet Take 1 tablet (324 mg total) by mouth daily with breakfast.   glucose blood (ONETOUCH VERIO) test strip 1 each by Other route 2 (two) times daily. Use as instructed   insulin  glargine (LANTUS  SOLOSTAR) 100 UNIT/ML Solostar Pen Inject 54 Units into the skin daily.   Insulin  Pen Needle 32G X 4 MM MISC 1 Device by Does not apply route daily in the afternoon.   levalbuterol (XOPENEX) 1.25 MG/3ML nebulizer solution Take 1.25 mg by nebulization as needed for wheezing.   metFORMIN  (GLUCOPHAGE ) 500 MG tablet TAKE 2 TABLETS BY MOUTH TWICE A DAY   Mometasone Furoate  (ASMANEX  HFA) 200 MCG/ACT AERO Inhale 1 puff into the lungs in the morning and at bedtime.   NON FORMULARY Plexus Probio 5 dietary supplement   NON FORMULARY Plexus Slim Hunger Control weight management series   RABEprazole  (ACIPHEX )  20 MG tablet Take 1 tablet (20 mg total) by mouth 2 (two) times daily.   Spacer/Aero-Holding Chambers (AEROCHAMBER PLUS WITH MASK) inhaler Use as directed   No facility-administered encounter medications on file as of 08/25/2023.    Allergies  Allergen Reactions   Fluticasone-Salmeterol Palpitations   Morphine Palpitations and Other (See Comments)    REACTION: heart racing   Peanut Oil Other (See Comments) and Cough    congested   Peanut-Containing Drug Products Cough   Sulfa Antibiotics Other (See Comments)    REACTION: takes away appetite, makes her feel nervous, jittery   Other Other (See Comments)    Oats and Green peppers---headaches   Penicillins Rash   Sulfamethoxazole Nausea Only   Sulfur Other (See Comments)    out of her body   Montelukast    Pioglitazone      Joint pain   Ciprofloxacin Anxiety   Clindamycin Rash   Cyclobenzaprine Other (See Comments)   Loracarbef Rash        Metronidazole Other (See Comments)   Moxifloxacin Hcl In Nacl Anxiety   Penicillin G Sodium Rash    Pertinent ROS per HPI, otherwise unremarkable      Objective:  BP (!) 158/81   Pulse (!) 105   Temp 97.7 F (36.5 C)   Ht 5' 1 (1.549 m)   Wt 142 lb (64.4 kg)   SpO2 95%   BMI 26.83 kg/m    Wt Readings from Last 3 Encounters:  08/25/23 142 lb (64.4 kg)  07/25/23 145 lb (65.8 kg)  07/14/23 144 lb (65.3 kg)    Physical Exam Vitals and nursing note reviewed.  Constitutional:      General: She is not in acute distress.    Appearance: Normal appearance. She is not ill-appearing, toxic-appearing or diaphoretic.  HENT:     Head: Normocephalic and atraumatic.     Nose: Nose normal.     Mouth/Throat:  Mouth: Mucous membranes are moist.  Eyes:     Conjunctiva/sclera: Conjunctivae normal.     Pupils: Pupils are equal, round, and reactive to light.  Cardiovascular:     Rate and Rhythm: Normal rate and regular rhythm.     Heart sounds: Normal heart sounds.  Pulmonary:      Effort: Pulmonary effort is normal.     Breath sounds: Normal breath sounds.  Abdominal:     General: Bowel sounds are normal.     Palpations: Abdomen is soft.   Musculoskeletal:     Right knee: Normal.     Left knee: Normal.     Right lower leg: No edema.     Left lower leg: No edema.  Skin:    General: Skin is warm and dry.     Capillary Refill: Capillary refill takes less than 2 seconds.  Neurological:     General: No focal deficit present.     Mental Status: She is alert and oriented to person, place, and time.  Psychiatric:        Mood and Affect: Mood normal.        Behavior: Behavior normal.        Thought Content: Thought content normal.        Judgment: Judgment normal.     Results for orders placed or performed in visit on 07/14/23  CBC with Differential (Cancer Center Only)   Collection Time: 07/14/23  3:25 PM  Result Value Ref Range   WBC Count 9.1 4.0 - 10.5 K/uL   RBC 4.19 3.87 - 5.11 MIL/uL   Hemoglobin 12.4 12.0 - 15.0 g/dL   HCT 62.3 63.9 - 53.9 %   MCV 89.7 80.0 - 100.0 fL   MCH 29.6 26.0 - 34.0 pg   MCHC 33.0 30.0 - 36.0 g/dL   RDW 86.9 88.4 - 84.4 %   Platelet Count 202 150 - 400 K/uL   nRBC 0.0 0.0 - 0.2 %   Neutrophils Relative % 59 %   Neutro Abs 5.5 1.7 - 7.7 K/uL   Lymphocytes Relative 27 %   Lymphs Abs 2.4 0.7 - 4.0 K/uL   Monocytes Relative 6 %   Monocytes Absolute 0.6 0.1 - 1.0 K/uL   Eosinophils Relative 5 %   Eosinophils Absolute 0.4 0.0 - 0.5 K/uL   Basophils Relative 1 %   Basophils Absolute 0.1 0.0 - 0.1 K/uL   Immature Granulocytes 2 %   Abs Immature Granulocytes 0.14 (H) 0.00 - 0.07 K/uL  CMP (Cancer Center only)   Collection Time: 07/14/23  3:25 PM  Result Value Ref Range   Sodium 140 135 - 145 mmol/L   Potassium 4.5 3.5 - 5.1 mmol/L   Chloride 102 98 - 111 mmol/L   CO2 26 22 - 32 mmol/L   Glucose, Bld 161 (H) 70 - 99 mg/dL   BUN 14 8 - 23 mg/dL   Creatinine 9.20 9.55 - 1.00 mg/dL   Calcium  10.0 8.9 - 10.3 mg/dL   Total  Protein 7.7 6.5 - 8.1 g/dL   Albumin 4.8 3.5 - 5.0 g/dL   AST 43 (H) 15 - 41 U/L   ALT 51 (H) 0 - 44 U/L   Alkaline Phosphatase 71 38 - 126 U/L   Total Bilirubin 0.4 0.0 - 1.2 mg/dL   GFR, Estimated >39 >39 mL/min   Anion gap 12 5 - 15  Ferritin   Collection Time: 07/14/23  3:25 PM  Result Value Ref Range  Ferritin 62 11 - 307 ng/mL  Iron and Iron Binding Capacity ([facility] only)   Collection Time: 07/14/23  3:25 PM  Result Value Ref Range   Iron 49 28 - 170 ug/dL   TIBC 592 749 - 549 ug/dL   Saturation Ratios 12 10.4 - 31.8 %   UIBC 358 148 - 442 ug/dL  CEA ([facility] ONLY   Collection Time: 07/14/23  3:25 PM  Result Value Ref Range   CEA ([facility]) 3.29 0.00 - 5.00 ng/mL  Lactate dehydrogenase (LDH)   Collection Time: 07/14/23  3:26 PM  Result Value Ref Range   LDH 131 98 - 192 U/L       Pertinent labs & imaging results that were available during my care of the patient were reviewed by me and considered in my medical decision making.  Assessment & Plan:  Sarin was seen today for knee pain.  Diagnoses and all orders for this visit:  Tick bite of abdomen, initial encounter -     Alpha-Gal Panel -     Lyme Disease Serology w/Reflex -     Spotted Fever Group Antibodies -     Cancel: CBC with Differential/Platelet -     CMP14+EGFR -     Anemia Profile B  Arthralgia of both knees -     Alpha-Gal Panel -     Lyme Disease Serology w/Reflex -     Spotted Fever Group Antibodies -     Cancel: CBC with Differential/Platelet -     CMP14+EGFR -     Anemia Profile B  Multiple joint pain -     Alpha-Gal Panel -     Lyme Disease Serology w/Reflex -     Spotted Fever Group Antibodies -     Cancel: CBC with Differential/Platelet -     CMP14+EGFR -     Anemia Profile B  Iron deficiency anemia due to chronic blood loss -     Anemia Profile B        Adverse reaction to iron infusion Experienced difficulty breathing approximately six hours post-iron infusion on June  12. Previous infusions did not elicit similar reactions. Transitioned to oral iron supplementation. No kidney-related symptoms reported, but significant reactions can affect renal function. - Order CBC and kidney and liver function tests to assess for adverse effects from the iron infusion  Knee pain Reports bilateral knee pain, left more severe than right, with pain extending to the hip. No known injury or swelling. Using ice and topical treatments for relief. - Recommend Biofreeze for additional topical pain relief - Continue applying ice to the affected area  Tick bites Removed ticks from abdomen about a month ago. Concern for tick-borne illnesses such as [facility] Spotted Fever or Lyme disease. No fever, chills, or other systemic symptoms reported. - Order blood work to check for antibodies related to tick-borne illnesses  General Health Maintenance On oral iron supplementation due to low iron levels identified by oncologist. Cautious with Tylenol  use due to liver disease. - Advise oral iron supplementation every other day - Monitor liver function tests to ensure safe use of Tylenol   Follow-up Will monitor test results and inform of any significant findings. No need to contact oncologist for iron levels as monitoring will be done here. - Review test results and contact with any significant findings          Continue all other maintenance medications.  Follow up plan: Return if symptoms worsen or fail to improve.  Continue healthy lifestyle choices, including diet (rich in fruits, vegetables, and lean proteins, and low in salt and simple carbohydrates) and exercise (at least 30 minutes of moderate physical activity daily).    The above assessment and management plan was discussed with the patient. The patient verbalized understanding of and has agreed to the management plan. Patient is aware to call the clinic if they develop any new symptoms or if symptoms persist or  worsen. Patient is aware when to return to the clinic for a follow-up visit. Patient educated on when it is appropriate to go to the emergency department.   Rosaline Bruns, FNP-C [facility] [phone]

## 2023-08-28 ENCOUNTER — Encounter: Payer: Self-pay | Admitting: Internal Medicine

## 2023-08-28 ENCOUNTER — Ambulatory Visit: Admitting: Internal Medicine

## 2023-08-28 VITALS — BP 140/74 | HR 103 | Ht 61.0 in | Wt 141.6 lb

## 2023-08-28 DIAGNOSIS — J4530 Mild persistent asthma, uncomplicated: Secondary | ICD-10-CM | POA: Diagnosis not present

## 2023-08-28 MED ORDER — ASMANEX HFA 200 MCG/ACT IN AERO: 1.0000 | INHALATION_SPRAY | Freq: Two times a day (BID) | RESPIRATORY_TRACT | 11 refills | Status: AC

## 2023-08-28 NOTE — Assessment & Plan Note (Addendum)
 Onset in childhood, recurred in her 30s  - allergy testing around 2000 in [place]   pine trees, grass, pollen  - 08/25/2020  After extensive coaching inhaler device,  effectiveness =   50 % with spacer (Ti too short/ poor coordination with trigger then breathe)    All goals of chronic asthma control met including optimal function and elimination of symptoms with minimal need for rescue therapy.  Contingencies discussed in full including contacting this office immediately if not controlling the symptoms using the rule of two's.     F/u can by yearly

## 2023-08-28 NOTE — Progress Notes (Addendum)
 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 Pt states she was dx with Asthma in the 1990's. She states doing well currently, and has not had any symptoms in the past 10-15 years since started taking Asmanex .   Dyspnea:  Not limited by breathing from desired activities  / walks 30 min daily in house more limited by feet than breathing Cough: none Sleep: bed flat / sev big pillows  SABA use: on exp to smoke / has levoalbuterol very rarely needs  Rec Plan A = Automatic = Always=    Asmanex  200 1-2 first thing in am and repeat this in pm  (if worse symptoms use the full dose of 2 puffs every 12 hours for a full week)  We will send rx for new spacer to your pharmacy  Plan B = Backup (to supplement plan A, not to replace it) Only use your albuterol  inhaler as a rescue medication Plan C = Crisis (instead of Plan B but only if Plan B stops working)  - only use your albuterol  nebulizer if you first try Plan B and it fails to help  Please schedule a follow up visit in 12 months but call sooner if needed  with all medications /inhalers/ solutions in hand    2nd Covid Jan 2023 rx with paxlovid  but not admitted rx prednisone /increased saba    08/20/2021  f/u ov/[place] office/Taia Bramlett re: asthma  maint on asmanex  200 1-2 bid   Chief Complaint  Patient presents with   Follow-up    Feels asthma  is controlled. Would like refills on rescue inhaler.   Dyspnea:  baseline denies sob but not exercising yet Cough: none  Sleeping: no resp cc wedge under mattress/ 2 pillow  SABA use: none now 02: none  Covid status: never vax  Rec No change in medications - work back toward 30 min of exercise at a minimum 3 weekly      08/24/2022  Yearly f/u f/u ov/[place] office/Jaxon Mynhier re: asthma maint on asmanex   200 one bid  Chief Complaint  Patient presents with   Follow-up    Breathing is overall doing well. She uses her albuterol  inhaler once per wk on average and has not used neb since last visit.   Dyspnea:  inside house walking x 30 min  Cough: none  Sleeping: flat bed, couple of big pillows  SABA use: rarely  02: none  Covid status: still never vax, infected x 3 Rec Plan A = Automatic = Always=    Asmanex  200 one puff twice daily - if worse immediately  increase to 2 twice daily until back to normal for a week and not needing any albuterol  then taper back to one twice daily  Plan B = Backup (to supplement plan A, not to replace it)  Only use your albuterol  inhaler as a rescue medication  Plan C = Crisis (instead of Plan B but only if Plan B stops working) - only use your levoalbuterol nebulizer if you first try Plan B    08/28/2023  f/u ov/[place] office/Remiel Corti re: asthma maint on asmanex  200 2bid  Dyspnea:  limited by legs/ back > doe  Cough: none  Sleeping: in recliner30 degrees due to back s resp cc  SABA use: rarely  02: none    No obvious day to day or daytime variability or assoc excess/ purulent sputum or mucus plugs or hemoptysis or cp or chest tightness, subjective wheeze or overt sinus or hb symptoms.    Also denies any obvious fluctuation of symptoms with weather or environmental changes or other aggravating or alleviating factors except as outlined above   No unusual exposure hx or h/o childhood pna or knowledge of premature birth.  Current Allergies, Complete Past  Medical History, Past Surgical History, Family History, and Social History were reviewed in [employer] record.  ROS  The following are not active complaints unless bolded Hoarseness, sore throat, dysphagia, dental problems, itching, sneezing,  nasal congestion or discharge of excess mucus or purulent secretions, ear ache,   fever, chills, sweats, unintended wt loss or wt gain, classically pleuritic or exertional cp,  orthopnea pnd or arm/hand swelling  or leg swelling, presyncope, palpitations, abdominal pain, anorexia, nausea, vomiting, diarrhea  or change in bowel habits or change in bladder habits, change in stools or change in urine, dysuria, hematuria,  rash, arthralgias, visual complaints, headache, numbness, weakness or ataxia or problems with walking or coordination,  change in mood or  memory.        Current Meds  Medication Sig   albuterol  (VENTOLIN  HFA) 108 (90 Base) MCG/ACT inhaler Inhale 1-2 puffs into the lungs every 4 (four) hours as needed. For shortness for breath   BLACK ELDERBERRY PO Take 1 tablet by mouth daily.   Blood Glucose Monitoring Suppl (ONETOUCH VERIO REFLECT) w/Device KIT USE TO TEST BLOOD SUGAR ONCE DAILY IN THE EVENING   Cetirizine HCl 10 MG CAPS Take 10 mg by mouth as needed.   cholecalciferol (VITAMIN D ) 25 MCG (1000 UT) tablet Take 1,000 Units by mouth daily.   Continuous Glucose Sensor (DEXCOM G7 SENSOR) MISC 1 Device by Does not apply route as directed.   diclofenac  Sodium (VOLTAREN ) 1 % GEL APPLY 2 GRAMS TO AFFECTED AREA 4 TIMES A DAY   EPINEPHrine  0.3 mg/0.3 mL IJ SOAJ injection as needed.   ferrous gluconate  (FERGON) 324 MG tablet Take 1 tablet (324 mg total) by mouth daily with breakfast.   glucose blood (ONETOUCH VERIO) test strip 1 each by Other route 2 (two) times daily. Use as instructed   insulin  glargine (LANTUS  SOLOSTAR) 100 UNIT/ML Solostar Pen Inject 54 Units into the skin daily.   Insulin  Pen Needle 32G X 4 MM MISC 1 Device  by Does not apply route daily in the afternoon.   levalbuterol (XOPENEX) 1.25 MG/3ML nebulizer solution Take 1.25 mg by nebulization as needed for wheezing.   metFORMIN  (GLUCOPHAGE ) 500 MG tablet TAKE 2 TABLETS BY MOUTH TWICE A DAY   Mometasone Furoate  (ASMANEX  HFA) 200 MCG/ACT AERO Inhale 1 puff into the lungs in the morning and at bedtime.   NON FORMULARY Plexus Probio 5 dietary supplement   NON FORMULARY Plexus Slim Hunger Control weight management series   RABEprazole  (ACIPHEX ) 20 MG tablet Take 1 tablet (20 mg total)  by mouth 2 (two) times daily.   Spacer/Aero-Holding Chambers (AEROCHAMBER PLUS WITH MASK) inhaler Use as directed                .  Past Medical History:  Diagnosis Date   Allergy    Arthritis    DDD   Asthma    Colon cancer (HCC) 2008   Diabetes mellitus    Elevated liver enzymes    GERD (gastroesophageal reflux disease)    Hyperlipidemia    Thyroid  tumor, benign        Objective:    Wts  08/28/2023       141   08/24/2022       144   08/20/21 143 lb 12.8 oz (65.2 kg)  08/09/21 145 lb (65.8 kg)  07/26/21 143 lb (64.9 kg)     Vital signs reviewed  08/28/2023  - Note at rest 02 sats  97% on RA   General appearance:    amb pleasant wf nad   HEENT : Oropharynx  clear      Nasal turbinates nl    NECK :  without  apparent JVD/ palpable Nodes/TM    LUNGS: no acc muscle use,  Nl contour chest which is clear to A and P bilaterally without cough on insp or exp maneuvers   CV:  RRR  no s3 or murmur or increase in P2, and no edema   ABD:  soft and nontender   MS:  limps/  ext warm without deformities Or obvious joint restrictions  calf tenderness, cyanosis or clubbing    SKIN: warm and dry without lesions    NEURO:  alert, approp, nl sensorium with  no motor or cerebellar deficits apparent.        Assessment

## 2023-08-28 NOTE — Patient Instructions (Signed)
No change in medications   Please schedule a follow up visit in 12  months but call sooner if needed  

## 2023-08-29 ENCOUNTER — Telehealth: Payer: Self-pay

## 2023-08-29 ENCOUNTER — Ambulatory Visit: Payer: Self-pay | Admitting: Family Medicine

## 2023-08-29 LAB — SPOTTED FEVER GROUP ANTIBODIES
Spotted Fever Group IgG: <1:64 {titer}
Spotted Fever Group IgM: <1:64 {titer}

## 2023-08-29 LAB — ANEMIA PROFILE B
Basophils Absolute: 0.1 x10E3/uL (ref 0.0–0.2)
Basos: 1 %
EOS (ABSOLUTE): 0.2 x10E3/uL (ref 0.0–0.4)
Eos: 3 %
Ferritin: 409 ng/mL — ABNORMAL HIGH (ref 15–150)
Folate: 14.4 ng/mL (ref >3.0)
Hematocrit: 42.9 % (ref 34.0–46.6)
Hemoglobin: 14.3 g/dL (ref 11.1–15.9)
Immature Grans (Abs): 0 x10E3/uL (ref 0.0–0.1)
Immature Granulocytes: 0 %
Iron Saturation: 32 % (ref 15–55)
Iron: 101 ug/dL (ref 27–139)
Lymphocytes Absolute: 2.6 x10E3/uL (ref 0.7–3.1)
Lymphs: 29 %
MCH: 31 pg (ref 26.6–33.0)
MCHC: 33.3 g/dL (ref 31.5–35.7)
MCV: 93 fL (ref 79–97)
Monocytes Absolute: 0.6 x10E3/uL (ref 0.1–0.9)
Monocytes: 7 %
Neutrophils Absolute: 5.6 x10E3/uL (ref 1.4–7.0)
Neutrophils: 60 %
Platelets: 205 x10E3/uL (ref 150–450)
RBC: 4.62 x10E6/uL (ref 3.77–5.28)
RDW: 13.6 % (ref 11.7–15.4)
Retic Ct Pct: 1.8 % (ref 0.6–2.6)
Total Iron Binding Capacity: 318 ug/dL (ref 250–450)
UIBC: 217 ug/dL (ref 118–369)
Vitamin B-12: >2000 pg/mL — ABNORMAL HIGH (ref 232–1245)
WBC: 9.2 x10E3/uL (ref 3.4–10.8)

## 2023-08-29 LAB — CMP14+EGFR
ALT: 44 IU/L — ABNORMAL HIGH (ref 0–32)
AST: 35 IU/L (ref 0–40)
Albumin: 4.8 g/dL (ref 3.9–4.9)
Alkaline Phosphatase: 104 IU/L (ref 44–121)
BUN/Creatinine Ratio: 14 (ref 12–28)
BUN: 11 mg/dL (ref 8–27)
Bilirubin Total: 0.3 mg/dL (ref 0.0–1.2)
CO2: 16 mmol/L — ABNORMAL LOW (ref 20–29)
Calcium: 10 mg/dL (ref 8.7–10.3)
Chloride: 103 mmol/L (ref 96–106)
Creatinine, Ser: 0.78 mg/dL (ref 0.57–1.00)
Globulin, Total: 2.8 g/dL (ref 1.5–4.5)
Glucose: 117 mg/dL — ABNORMAL HIGH (ref 70–99)
Potassium: 5.4 mmol/L — ABNORMAL HIGH (ref 3.5–5.2)
Sodium: 142 mmol/L (ref 134–144)
Total Protein: 7.6 g/dL (ref 6.0–8.5)
eGFR: 84 mL/min/1.73 (ref >59)

## 2023-08-29 LAB — LYME DISEASE SEROLOGY W/REFLEX

## 2023-08-29 LAB — ALPHA-GAL PANEL
Allergen Lamb IgE: 5.01 kU/L — AB
Beef IgE: 12.3 kU/L — AB
IgE (Immunoglobulin E), Serum: 389 [IU]/mL (ref 6–495)
O215-IgE Alpha-Gal: 30.3 kU/L — AB
Pork IgE: 5.64 kU/L — AB

## 2023-08-29 NOTE — Telephone Encounter (Signed)
 Patient aware of current lab results

## 2023-08-29 NOTE — Telephone Encounter (Signed)
 Copied from CRM [id number]. Topic: General - Other >> Aug 29, 2023  1:38 PM Emylou G wrote: Reason for CRM: Adv patient of partial labs done.. relayed part of it.. told her rest is pending

## 2023-08-30 ENCOUNTER — Telehealth: Payer: Self-pay | Admitting: Family

## 2023-08-30 ENCOUNTER — Ambulatory Visit: Payer: Self-pay

## 2023-08-30 NOTE — Telephone Encounter (Signed)
 Pt called requesting to speak with a nurse regarding her lab results. Per E2C2, she has additional questions.

## 2023-08-30 NOTE — Telephone Encounter (Signed)
 Reviewed results with pt and recommendations. She will contact her oncologist to follow up on results first and then call back to schedule with [facility].

## 2023-08-30 NOTE — Telephone Encounter (Signed)
 FYI Only or Action Required?: Action required by provider: clinical question for provider.  Patient was last seen in primary care on 08/25/2023 by Severa Rock HERO, FNP.  Called Nurse Triage reporting Requesting Lab Results.  Triage Disposition: Call PCP When Office is Open  Patient/caregiver understands and will follow disposition?: Yes  Copied from CRM [id number]. Topic: Clinical - Lab/Test Results >> Aug 30, 2023  4:18 PM Stephanie Carroll wrote: Reason for CRM: patient would like to discuss test results, she's been waiting for a callback. Reason for Disposition  Caller requesting routine or non-urgent lab result  Answer Assessment - Initial Assessment Questions 1. REASON FOR CALL or QUESTION: What is your reason for calling today? or How can I best     Patient requesting lab results from recent visit. Results given to patient-patient reports having questions that Nurse Triage is unable to answer. Patient is asking for a call back from office.  2. CALLER: Document the source of call. (e.g., laboratory staff, caregiver or patient).     Patient  Protocols used: PCP Call - No Triage-A-AH

## 2023-08-31 ENCOUNTER — Telehealth: Admitting: Family Medicine

## 2023-08-31 ENCOUNTER — Telehealth: Payer: Self-pay

## 2023-08-31 ENCOUNTER — Encounter: Payer: Self-pay | Admitting: Family Medicine

## 2023-08-31 DIAGNOSIS — S30861A Insect bite (nonvenomous) of abdominal wall, initial encounter: Secondary | ICD-10-CM

## 2023-08-31 DIAGNOSIS — Z91018 Allergy to other foods: Secondary | ICD-10-CM

## 2023-08-31 DIAGNOSIS — M2550 Pain in unspecified joint: Secondary | ICD-10-CM | POA: Diagnosis not present

## 2023-08-31 DIAGNOSIS — M25561 Pain in right knee: Secondary | ICD-10-CM

## 2023-08-31 DIAGNOSIS — E113299 Type 2 diabetes mellitus with mild nonproliferative diabetic retinopathy without macular edema, unspecified eye: Secondary | ICD-10-CM

## 2023-08-31 DIAGNOSIS — M25562 Pain in left knee: Secondary | ICD-10-CM

## 2023-08-31 DIAGNOSIS — W57XXXA Bitten or stung by nonvenomous insect and other nonvenomous arthropods, initial encounter: Secondary | ICD-10-CM

## 2023-08-31 MED ORDER — EPINEPHRINE 0.3 MG/0.3ML IJ SOAJ: 0.3000 mg | INTRAMUSCULAR | 11 refills | Status: AC | PRN

## 2023-08-31 MED ORDER — DOXYCYCLINE HYCLATE 100 MG PO TABS
100.0000 mg | ORAL_TABLET | Freq: Two times a day (BID) | ORAL | 0 refills | Status: AC
Start: 2023-08-31 — End: 2023-09-10

## 2023-08-31 NOTE — Telephone Encounter (Signed)
 Patient called stating she got one dose of IV iron and had a reaction and was just informed to try oral iron for the time being, she had an issue with one of her other oral irons in the past so sarah carter,np sent in an rx for a different one, she hasn't picked it up yet and was told by her pcp to contact us  if she needed to start it cause they rechecked her ferritin and it was up in the 400s.  Checked with [facility] who reviewed as Lauraine Pepper is out of office, no oral iron at this time, repeat labs and see [facility] in 4 weeks. Pt aware and transferred to scheduling.

## 2023-08-31 NOTE — Telephone Encounter (Signed)
 Copied from CRM [id number]. Topic: Referral - Request for Referral >> Aug 31, 2023 12:45 PM Carlyon D wrote: Did the patient discuss referral with their provider in the last year? Yes (If No - schedule appointment) (If Yes - send message)  Appointment offered? Yes/ Pt has been seen here before just needs an updated referral to same place.   Type of order/referral and detailed reason for visit: Nutrition & Diabetes education NDM  Preference of office, provider, location: [facility] [place]   If referral order, have you been seen by this specialty before? Yes (If Yes, this issue or another issue? When? Where?  Can we respond through MyChart? Yes

## 2023-08-31 NOTE — Telephone Encounter (Signed)
 Patient has appointment today will refer to labs

## 2023-08-31 NOTE — Addendum Note (Signed)
 Addended by: LAVELL LYE A on: 08/31/2023 03:22 PM   Modules accepted: Orders

## 2023-08-31 NOTE — Progress Notes (Signed)
 Virtual Visit via Video   I connected with patient on 08/31/23 at 1250 by a video enabled telemedicine application and verified that I am speaking with the correct person using two identifiers.  Location patient: Home Location provider: [facility] Persons participating in the virtual visit: Patient and Provider  I discussed the limitations of evaluation and management by telemedicine and the availability of in person appointments. The patient expressed understanding and agreed to proceed.  Subjective:   HPI:  Pt presents today for  Chief Complaint  Patient presents with   Knee Pain   Stephanie Carroll is a 68 year old female with alpha-gal syndrome who presents with leg pain.  She experiences persistent pain in both legs, with the right leg pain radiating to her hip. Her cousin found relief from similar symptoms with doxycycline , despite not testing positive for Lyme disease.  She has been diagnosed with alpha-gal syndrome, confirmed by a positive test. She avoids mammalian products due to her allergy, including beef, pork, lamb, and dairy. She primarily consumes chicken and fish and is considering plant-based alternatives for dairy.  She is diabetic and rarely consumes red meat, approximately once every six months.  She has been in contact with her oncologist regarding her iron levels and is scheduled for a follow-up in four weeks to reassess her iron status.       ROS per HPI  Patient Active Problem List   Diagnosis Date Noted   Allergy to alpha-gal 08/31/2023   Acute sinusitis 04/25/2023   Long term current use of oral hypoglycemic drug 10/10/2022   Left thyroid  nodule 10/29/2021   Type 2 diabetes mellitus with mild nonproliferative retinopathy without macular edema, with long-term current use of insulin  (HCC) 10/29/2021   Diabetic retinopathy (HCC) 02/24/2021   Vitamin B12 deficiency 02/24/2021   GAD (generalized anxiety disorder) 02/24/2021    Hypertension associated with type 2 diabetes mellitus (HCC) 10/23/2020   Iron deficiency anemia due to chronic blood loss 09/17/2020   Chronic asthma, mild persistent, uncomplicated 08/25/2020   Carpal tunnel syndrome of right wrist 11/06/2017   Cervical radiculopathy 10/20/2017   DDD (degenerative disc disease), cervical 10/20/2017   Environmental allergies 03/23/2015   Hand arthritis 03/23/2015   Multiple joint pain 03/23/2015   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   Simple goiter 03/23/2015   Internal hemorrhoids 06/30/2014   Non-alcoholic fatty liver disease 11/27/2013   Rectal bleeding 09/20/2013   Personal history of colon cancer, stage I 09/20/2013   Vitamin D  deficiency 12/19/2008   History of malignant neoplasm of large intestine 08/04/2008   Hyperlipidemia associated with type 2 diabetes mellitus (HCC) 06/19/2008   Irritable bowel syndrome 03/19/2008   Diaphragmatic hernia 12/18/2007   Gastro-esophageal reflux disease without esophagitis 06/06/2007   Migraine headache 02/12/2007    Social History   Tobacco Use   Smoking status: Never   Smokeless tobacco: Never   Tobacco comments:    never used product  Substance Use Topics   Alcohol use: No    Alcohol/week: 0.0 standard drinks of alcohol    Current Outpatient Medications:    doxycycline  (VIBRA -TABS) 100 MG tablet, Take 1 tablet (100 mg total) by mouth 2 (two) times daily for 10 days. 1 po bid, Disp: 20 tablet, Rfl: 0   albuterol  (VENTOLIN  HFA) 108 (90 Base) MCG/ACT inhaler, Inhale 1-2 puffs into the lungs every 4 (four) hours as needed. For shortness for breath, Disp: 18 g, Rfl: 11   BLACK ELDERBERRY PO, Take 1  tablet by mouth daily., Disp: , Rfl:    Blood Glucose Monitoring Suppl (ONETOUCH VERIO REFLECT) w/Device KIT, USE TO TEST BLOOD SUGAR ONCE DAILY IN THE EVENING, Disp: 1 kit, Rfl: 0   Cetirizine HCl 10 MG CAPS, Take 10 mg by mouth as needed., Disp: , Rfl:    cholecalciferol (VITAMIN D ) 25 MCG (1000 UT) tablet, Take  1,000 Units by mouth daily., Disp: , Rfl:    Continuous Glucose Sensor (DEXCOM G7 SENSOR) MISC, 1 Device by Does not apply route as directed., Disp: 9 each, Rfl: 3   diclofenac  Sodium (VOLTAREN ) 1 % GEL, APPLY 2 GRAMS TO AFFECTED AREA 4 TIMES A DAY, Disp: 200 g, Rfl: 2   EPINEPHrine  0.3 mg/0.3 mL IJ SOAJ injection, Inject 0.3 mg into the muscle as needed for anaphylaxis., Disp: 1 each, Rfl: 11   ferrous gluconate  (FERGON) 324 MG tablet, Take 1 tablet (324 mg total) by mouth daily with breakfast., Disp: 60 tablet, Rfl: 3   glucose blood (ONETOUCH VERIO) test strip, 1 each by Other route 2 (two) times daily. Use as instructed, Disp: 200 each, Rfl: 3   insulin  glargine (LANTUS  SOLOSTAR) 100 UNIT/ML Solostar Pen, Inject 54 Units into the skin daily., Disp: 30 mL, Rfl: 1   Insulin  Pen Needle 32G X 4 MM MISC, 1 Device by Does not apply route daily in the afternoon., Disp: 100 each, Rfl: 3   levalbuterol (XOPENEX) 1.25 MG/3ML nebulizer solution, Take 1.25 mg by nebulization as needed for wheezing., Disp: , Rfl:    metFORMIN  (GLUCOPHAGE ) 500 MG tablet, TAKE 2 TABLETS BY MOUTH TWICE A DAY, Disp: 120 tablet, Rfl: 2   Mometasone Furoate  (ASMANEX  HFA) 200 MCG/ACT AERO, Inhale 1 puff into the lungs in the morning and at bedtime., Disp: 1 each, Rfl: 11   NON FORMULARY, Plexus Probio 5 dietary supplement, Disp: , Rfl:    NON FORMULARY, Plexus Slim Hunger Control weight management series, Disp: , Rfl:    RABEprazole  (ACIPHEX ) 20 MG tablet, Take 1 tablet (20 mg total) by mouth 2 (two) times daily., Disp: 180 tablet, Rfl: 3   Spacer/Aero-Holding Chambers (AEROCHAMBER PLUS WITH MASK) inhaler, Use as directed, Disp: 1 each, Rfl: 11  Allergies  Allergen Reactions   Fluticasone-Salmeterol Palpitations   Morphine Palpitations and Other (See Comments)    REACTION: heart racing   Peanut Oil Other (See Comments) and Cough    congested   Peanut-Containing Drug Products Cough   Sulfa Antibiotics Other (See Comments)     REACTION: takes away appetite, makes her feel nervous, jittery   Other Other (See Comments)    Oats and Green peppers---headaches   Penicillins Rash   Sulfamethoxazole Nausea Only   Sulfur Other (See Comments)    out of her body   Montelukast    Pioglitazone      Joint pain   Ciprofloxacin Anxiety   Clindamycin Rash   Cyclobenzaprine Other (See Comments)   Loracarbef Rash        Metronidazole Other (See Comments)   Moxifloxacin Hcl In Nacl Anxiety   Penicillin G Sodium Rash    Objective:   There were no vitals taken for this visit.  Patient is well-developed, well-nourished in no acute distress.  Resting comfortably at home.  Head is normocephalic, atraumatic.  No labored breathing.  Speech is clear and coherent with logical content.  Patient is alert and oriented at baseline.   Assessment and Plan:   Stephanie Carroll was seen today for knee pain.  Diagnoses and all  orders for this visit:  Tick bite of abdomen, initial encounter -     doxycycline  (VIBRA -TABS) 100 MG tablet; Take 1 tablet (100 mg total) by mouth 2 (two) times daily for 10 days. 1 po bid  Arthralgia of both knees -     doxycycline  (VIBRA -TABS) 100 MG tablet; Take 1 tablet (100 mg total) by mouth 2 (two) times daily for 10 days. 1 po bid  Multiple joint pain -     doxycycline  (VIBRA -TABS) 100 MG tablet; Take 1 tablet (100 mg total) by mouth 2 (two) times daily for 10 days. 1 po bid  Allergy to alpha-gal -     EPINEPHrine  0.3 mg/0.3 mL IJ SOAJ injection; Inject 0.3 mg into the muscle as needed for anaphylaxis.     Alpha-gal syndrome Positive alpha-gal test confirms allergy to mammalian products. She avoids red meat due to diabetes and primarily consumes chicken and fish. Educated on avoiding all mammalian products, including dairy, and using plant-based alternatives. Provided resources for credible information on alpha-gal syndrome. Prescribed EpiPen  for emergency use due to risk of allergic reactions. - Send  alpha-gal information to MyChart - Prescribe EpiPen  for emergency use - Educate on avoiding mammalian products and using plant-based alternatives  Leg pain Persistent bilateral leg pain, with right leg pain extending to the hip. She inquired about doxycycline  treatment based on anecdotal evidence from a relative. All titers have returned negative. Agreed to trial doxycycline  with understanding it may not be effective and further testing may be needed if symptoms persist. - Prescribe doxycycline  - Re-evaluate if symptoms persist or worsen  Iron deficiency monitoring Under oncologist care for iron deficiency. Follow-up appointment scheduled in four weeks to recheck iron levels. - Continue follow-up with oncologist for iron level monitoring          Return if symptoms worsen or fail to improve.  Rosaline Bruns, FNP-C [facility] [address] [place], [place] [address] [phone]  08/31/2023  Time spent with the patient: 15 minutes, of which >50% was spent in obtaining information about symptoms, reviewing previous labs, evaluations, and treatments, counseling about condition (please see the discussed topics above), and developing a plan to further investigate it; had a number of questions which I addressed.

## 2023-08-31 NOTE — Telephone Encounter (Signed)
 Referral placed.

## 2023-09-01 ENCOUNTER — Telehealth: Payer: Self-pay | Admitting: Internal Medicine

## 2023-09-01 NOTE — Telephone Encounter (Signed)
 Patient is calling to say that she was diagnosed on Tuesday 7/152025 with Alpha-Gal Syndrome.  Patient states that since having to change her diet that her blood sugar levels have been low.  Patient states that this morning at 3:00 AM she woke up and her level was 57.  Patient states that she drank a regular coke and ate a piece of jelly toast.  After that she states that her blood sugar level went 60, 68 and then to 159.  Today at 9:30 AM patient states that her blood sugar level was 79.  Patient wants to know what she needs to do.

## 2023-09-01 NOTE — Telephone Encounter (Signed)
 Stephanie Carroll, Reviewing Dr. Kris last note, she is on 60 units of Lantus  daily.  Please advise her to decrease the dose to 40 units daily and keep a close watch on her blood sugars.  Please let us  know if the sugars continue to drop.

## 2023-09-01 NOTE — Telephone Encounter (Signed)
 I advise pt per Dr Evia recommendation to decrease the dose to 40 units daily and keep a close watch on her blood sugars. Pt said that she would decrease dose and let us  know if sugar continued to drop.

## 2023-09-04 ENCOUNTER — Encounter: Payer: Self-pay | Admitting: Nutrition

## 2023-09-04 ENCOUNTER — Encounter: Attending: Family | Admitting: Nutrition

## 2023-09-04 VITALS — Ht 61.0 in | Wt 137.0 lb

## 2023-09-04 DIAGNOSIS — T781XXA Other adverse food reactions, not elsewhere classified, initial encounter: Secondary | ICD-10-CM | POA: Insufficient documentation

## 2023-09-04 NOTE — Patient Instructions (Signed)
 Follow Alpha Gal guidelines discussed Keep a food journal and document symptoms related to foods eaten.

## 2023-09-04 NOTE — Progress Notes (Signed)
 Medical Nutrition Therapy  Appointment Start time:  1520  Appointment End time:  1600  Primary concerns today: Alpha gal  Referral diagnosis: T78.1 Preferred learning style: See  Learning readiness: Ready (not ready, contemplating, ready, change in progress)   NUTRITION ASSESSMENT 68 yr old wfemale referred for new diagnosis of alpha gal a week ago.  She had gotten an iron infusion, then had severe knee pains afterwards. Had a tick bite a few weeks ago. Iron levels were very high. Doesn' tknow if knee pains were alpha gal or iron infusion reaction. Goes back to oncologist to check her ferrifin levels in the nexxt few weeks..  Is going to download the FIG app. Has been reading a lot about what foods to avoid as well as medications and skin products.  Clinical Medical Hx:  Past Medical History:  Diagnosis Date   Allergy    Arthritis    DDD   Asthma    Colon cancer (HCC) 2008   Diabetes mellitus    Elevated liver enzymes    GERD (gastroesophageal reflux disease)    History of thyroiditis 12/18/2007   Formatting of this note might be different from the original. Thyroiditis   Hyperlipidemia    Iron deficiency anemia due to chronic blood loss 09/17/2020   Thyroid  tumor, benign     Medications:  Current Outpatient Medications on File Prior to Visit  Medication Sig Dispense Refill   albuterol  (VENTOLIN  HFA) 108 (90 Base) MCG/ACT inhaler Inhale 1-2 puffs into the lungs every 4 (four) hours as needed. For shortness for breath 18 g 11   BLACK ELDERBERRY PO Take 1 tablet by mouth daily.     Blood Glucose Monitoring Suppl (ONETOUCH VERIO REFLECT) w/Device KIT USE TO TEST BLOOD SUGAR ONCE DAILY IN THE EVENING 1 kit 0   Cetirizine  HCl 10 MG CAPS Take 10 mg by mouth as needed.     cholecalciferol (VITAMIN D ) 25 MCG (1000 UT) tablet Take 1,000 Units by mouth daily.     Continuous Glucose Sensor (DEXCOM G7 SENSOR) MISC 1 Device by Does not apply route as directed. 9 each 3   diclofenac  Sodium  (VOLTAREN ) 1 % GEL APPLY 2 GRAMS TO AFFECTED AREA 4 TIMES A DAY 200 g 2   doxycycline  (VIBRA -TABS) 100 MG tablet Take 1 tablet (100 mg total) by mouth 2 (two) times daily for 10 days. 1 po bid 20 tablet 0   EPINEPHrine  0.3 mg/0.3 mL IJ SOAJ injection Inject 0.3 mg into the muscle as needed for anaphylaxis. 1 each 11   ferrous gluconate  (FERGON) 324 MG tablet Take 1 tablet (324 mg total) by mouth daily with breakfast. 60 tablet 3   glucose blood (ONETOUCH VERIO) test strip 1 each by Other route 2 (two) times daily. Use as instructed 200 each 3   insulin  glargine (LANTUS  SOLOSTAR) 100 UNIT/ML Solostar Pen Inject 54 Units into the skin daily. 30 mL 1   Insulin  Pen Needle 32G X 4 MM MISC 1 Device by Does not apply route daily in the afternoon. 100 each 3   levalbuterol (XOPENEX) 1.25 MG/3ML nebulizer solution Take 1.25 mg by nebulization as needed for wheezing.     metFORMIN  (GLUCOPHAGE ) 500 MG tablet TAKE 2 TABLETS BY MOUTH TWICE A DAY 120 tablet 2   Mometasone Furoate  (ASMANEX  HFA) 200 MCG/ACT AERO Inhale 1 puff into the lungs in the morning and at bedtime. 1 each 11   NON FORMULARY Plexus Probio 5 dietary supplement     NON  FORMULARY Plexus Slim Hunger Control weight management series     RABEprazole  (ACIPHEX ) 20 MG tablet Take 1 tablet (20 mg total) by mouth 2 (two) times daily. 180 tablet 3   Spacer/Aero-Holding Chambers (AEROCHAMBER PLUS WITH MASK) inhaler Use as directed 1 each 11   No current facility-administered medications on file prior to visit.    Labs:  Notable Signs/Symptoms: knees hurting  Lifestyle & Dietary Hx Lives with her husband.  Estimated daily fluid intake: 40 oz Supplements:  Sleep:  Stress / self-care:  Current average weekly physical activity: walks at home  24-Hr Dietary Recall Has been avoiding beef and pork and dairy products,   Estimated Energy Needs Calories: 1500 Carbohydrate: 170g Protein: 112g Fat: 42g   NUTRITION DIAGNOSIS  NB-1.1 Food and  nutrition-related knowledge deficit As related to allergic to mammalian meats.  As evidenced by positive for Alpha Gal..   NUTRITION INTERVENTION  Nutrition education (E-1) on the following topics:  Alpha Gal and diet restrictins  Handouts Provided Include  Alpha Gal diet restrictions  Learning Style & Readiness for Change Teaching method utilized: Visual & Auditory  Demonstrated degree of understanding via: Teach Back  Barriers to learning/adherence to lifestyle change: None  Goals Established by Pt Follow Alpha Gal restrictions Keep food journal   MONITORING & EVALUATION Dietary intake, weekly physical activity, and  1 month.  Next Steps  Patient is to follow diet restrictions for alpha gal..

## 2023-09-05 ENCOUNTER — Other Ambulatory Visit: Payer: Self-pay | Admitting: Family

## 2023-09-05 ENCOUNTER — Telehealth: Payer: Self-pay | Admitting: Family

## 2023-09-05 MED ORDER — CETIRIZINE HCL 10 MG PO TABS: 10.0000 mg | ORAL_TABLET | Freq: Every day | ORAL | 1 refills | Status: AC

## 2023-09-05 NOTE — Telephone Encounter (Signed)
 She can take zyrtec  10 mg daily.

## 2023-09-05 NOTE — Telephone Encounter (Signed)
 Patient aware and verbalized understanding.

## 2023-09-05 NOTE — Addendum Note (Signed)
 Addended by: LAVELL LYE A on: 09/05/2023 12:52 PM   Modules accepted: Orders

## 2023-09-05 NOTE — Telephone Encounter (Signed)
  FYI Only or Action Required?: Action required by provider: Reason for CRM: patient called to see what allergy pill she can take as she was recently diagnosed with Alphagal. Please f/u with patient.  Patient was last seen in primary care on 08/31/2023 by Severa Rock HERO, FNP.  Called Nurse Triage reporting No chief complaint on file..  Symptoms began a week ago.  Interventions attempted: Nothing.  Symptoms are: stable.  Triage Disposition: No disposition on file.  Patient/caregiver understands and will follow disposition?:                     Copied from CRM [id number]. Topic: Clinical - Medical Advice >> Sep 05, 2023 10:27 AM Myrick T wrote: Reason for CRM: patient called to see what allergy pill she can take as she was recently diagnosed with Alphagal. Please f/u with patient

## 2023-09-06 ENCOUNTER — Telehealth: Payer: Self-pay

## 2023-09-06 NOTE — Telephone Encounter (Signed)
 Copied from CRM [id number]. Topic: Clinical - Medication Question >> Sep 06, 2023  9:00 AM Nathanel DEL wrote: Reason for CRM: pt states she has been dx w/ Alpha Gal- tick disease.  Pt wants to know is it safe to use her Mometasone Furoate  (ASMANEX  HFA) 200 MCG/ACT AERO or her  levalbuterol (XOPENEX) 1.25 MG/3ML nebulizer solution Pt is not suppose to use any dairy products.   Pt wants to ask Dr Darlean If these meds safe to take w/ having Alpha Gal?    Please advise

## 2023-09-06 NOTE — Telephone Encounter (Signed)
 Should be fine but if start having more resp symptoms than baseline  on them will need to return to clinic asap or refer to Allergy

## 2023-09-07 NOTE — Telephone Encounter (Signed)
 ATC x1 regarding Dr Leisa recs. Mail box is full

## 2023-09-14 ENCOUNTER — Other Ambulatory Visit: Payer: Self-pay

## 2023-09-14 MED ORDER — DEXCOM G7 RECEIVER DEVI: 1.0000 | 0 refills | Status: DC

## 2023-09-15 NOTE — Telephone Encounter (Signed)
 Spoke with pt regarding Dr leisa recs. NFN

## 2023-09-22 ENCOUNTER — Other Ambulatory Visit: Payer: Self-pay

## 2023-09-22 MED ORDER — DEXCOM G7 RECEIVER DEVI
1.0000 | 0 refills | Status: DC
Start: 2023-09-22 — End: 2023-12-19

## 2023-09-25 ENCOUNTER — Ambulatory Visit: Admitting: Internal Medicine

## 2023-09-26 ENCOUNTER — Ambulatory Visit: Admitting: Internal Medicine

## 2023-09-27 ENCOUNTER — Inpatient Hospital Stay: Admitting: Family

## 2023-09-27 ENCOUNTER — Encounter: Payer: Self-pay | Admitting: Family

## 2023-09-27 ENCOUNTER — Inpatient Hospital Stay: Attending: Hematology & Oncology

## 2023-09-27 VITALS — BP 136/57 | HR 95 | Temp 98.0°F | Resp 18 | Ht 61.0 in | Wt 134.0 lb

## 2023-09-27 DIAGNOSIS — M1990 Unspecified osteoarthritis, unspecified site: Secondary | ICD-10-CM | POA: Diagnosis not present

## 2023-09-27 DIAGNOSIS — Z79899 Other long term (current) drug therapy: Secondary | ICD-10-CM | POA: Insufficient documentation

## 2023-09-27 DIAGNOSIS — C182 Malignant neoplasm of ascending colon: Secondary | ICD-10-CM

## 2023-09-27 DIAGNOSIS — K625 Hemorrhage of anus and rectum: Secondary | ICD-10-CM

## 2023-09-27 DIAGNOSIS — Z885 Allergy status to narcotic agent status: Secondary | ICD-10-CM | POA: Diagnosis not present

## 2023-09-27 DIAGNOSIS — K219 Gastro-esophageal reflux disease without esophagitis: Secondary | ICD-10-CM

## 2023-09-27 DIAGNOSIS — E538 Deficiency of other specified B group vitamins: Secondary | ICD-10-CM

## 2023-09-27 DIAGNOSIS — K648 Other hemorrhoids: Secondary | ICD-10-CM

## 2023-09-27 DIAGNOSIS — R0602 Shortness of breath: Secondary | ICD-10-CM | POA: Diagnosis not present

## 2023-09-27 DIAGNOSIS — M2540 Effusion, unspecified joint: Secondary | ICD-10-CM | POA: Insufficient documentation

## 2023-09-27 DIAGNOSIS — Z881 Allergy status to other antibiotic agents status: Secondary | ICD-10-CM | POA: Insufficient documentation

## 2023-09-27 DIAGNOSIS — Z880 Allergy status to penicillin: Secondary | ICD-10-CM | POA: Diagnosis not present

## 2023-09-27 DIAGNOSIS — J45909 Unspecified asthma, uncomplicated: Secondary | ICD-10-CM | POA: Diagnosis not present

## 2023-09-27 DIAGNOSIS — D500 Iron deficiency anemia secondary to blood loss (chronic): Secondary | ICD-10-CM | POA: Diagnosis not present

## 2023-09-27 DIAGNOSIS — Z882 Allergy status to sulfonamides status: Secondary | ICD-10-CM | POA: Insufficient documentation

## 2023-09-27 LAB — CBC WITH DIFFERENTIAL (CANCER CENTER ONLY)
Abs Immature Granulocytes: 0.03 K/uL (ref 0.00–0.07)
Basophils Absolute: 0.1 K/uL (ref 0.0–0.1)
Basophils Relative: 1 %
Eosinophils Absolute: 0.3 K/uL (ref 0.0–0.5)
Eosinophils Relative: 3 %
HCT: 39 % (ref 36.0–46.0)
Hemoglobin: 13.2 g/dL (ref 12.0–15.0)
Immature Granulocytes: 0 %
Lymphocytes Relative: 34 %
Lymphs Abs: 3.1 K/uL (ref 0.7–4.0)
MCH: 30.4 pg (ref 26.0–34.0)
MCHC: 33.8 g/dL (ref 30.0–36.0)
MCV: 89.9 fL (ref 80.0–100.0)
Monocytes Absolute: 0.6 K/uL (ref 0.1–1.0)
Monocytes Relative: 6 %
Neutro Abs: 5.1 K/uL (ref 1.7–7.7)
Neutrophils Relative %: 56 %
Platelet Count: 202 K/uL (ref 150–400)
RBC: 4.34 MIL/uL (ref 3.87–5.11)
RDW: 12.8 % (ref 11.5–15.5)
WBC Count: 9.1 K/uL (ref 4.0–10.5)
nRBC: 0 % (ref 0.0–0.2)

## 2023-09-27 LAB — CMP (CANCER CENTER ONLY)
ALT: 34 U/L (ref 0–44)
AST: 33 U/L (ref 15–41)
Albumin: 4.5 g/dL (ref 3.5–5.0)
Alkaline Phosphatase: 85 U/L (ref 38–126)
Anion gap: 14 (ref 5–15)
BUN: 17 mg/dL (ref 8–23)
CO2: 21 mmol/L — ABNORMAL LOW (ref 22–32)
Calcium: 9.7 mg/dL (ref 8.9–10.3)
Chloride: 102 mmol/L (ref 98–111)
Creatinine: 1.09 mg/dL — ABNORMAL HIGH (ref 0.44–1.00)
GFR, Estimated: 56 mL/min — ABNORMAL LOW (ref >60)
Glucose, Bld: 210 mg/dL — ABNORMAL HIGH (ref 70–99)
Potassium: 5.2 mmol/L — ABNORMAL HIGH (ref 3.5–5.1)
Sodium: 136 mmol/L (ref 135–145)
Total Bilirubin: 0.4 mg/dL (ref 0.0–1.2)
Total Protein: 7.7 g/dL (ref 6.5–8.1)

## 2023-09-27 LAB — HEMOGLOBIN A1C
Hgb A1c MFr Bld: 7 % — ABNORMAL HIGH (ref 4.8–5.6)
Mean Plasma Glucose: 154 mg/dL

## 2023-09-27 LAB — CEA (ACCESS): CEA (CHCC): 2.31 ng/mL (ref 0.00–5.00)

## 2023-09-27 NOTE — Progress Notes (Signed)
 Hematology and Oncology Follow Up Visit  Stephanie Carroll [id number] 06-02-1955 68 y.o. 09/27/2023   Principle Diagnosis:  Stage II carcinoma of colon   Current Therapy:        Observation   Interim History:  Stephanie Carroll is here today for annual follow-up. 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 She was diagnosed a month or so after receiving IV iron in June. She is unsure if the infusion may have triggered a flare with SOB and heaviness in her legs.  She is now on a red meat and dairy free diet. She states that the change in diet has caused weight loss. Weight today is 134 lbs. She is doing her best to eat well and hydrate throughout the day.  No fever, chills, n/v, cough, rash, dizziness, chest pain, palpitations, abdominal pain or changes in bowel or bladder habits.  Some SOB with exertion and asthma.  Joint swelling, tenderness, numbness or tingling with arthritis waxes and wanes.  No falls or syncope reported.  No blood loss, bruising or petechiae noted.   ECOG Performance Status: 0 - Asymptomatic  Medications:  Allergies as of 09/27/2023       Reactions   Fluticasone-salmeterol Palpitations   Morphine Palpitations, Other (See Comments)   REACTION: heart racing   Peanut Oil Other (See Comments), Cough   congested   Peanut-containing Drug Products Cough   Sulfa Antibiotics Other (See Comments)   REACTION: takes away appetite, makes her feel nervous, jittery   Other Other (See Comments)   Oats and Green peppers---headaches   Penicillins Rash   Sulfamethoxazole Nausea Only   Sulfur Other (See Comments)   out of her body   Montelukast    Pioglitazone     Joint pain   Ciprofloxacin Anxiety   Clindamycin Rash   Cyclobenzaprine Other (See Comments)   Loracarbef Rash      Metronidazole Other (See Comments)   Moxifloxacin Hcl In Nacl Anxiety   Penicillin G Sodium Rash        Medication List        Accurate as of September 27, 2023 10:06 AM. If you have any questions,  ask your nurse or doctor.          Aerochamber Plus Device Use as directed   albuterol  108 (90 Base) MCG/ACT inhaler Commonly known as: VENTOLIN  HFA Inhale 1-2 puffs into the lungs every 4 (four) hours as needed. For shortness for breath   Asmanex  HFA 200 MCG/ACT Aero Generic drug: Mometasone Furoate  Inhale 1 puff into the lungs in the morning and at bedtime.   BLACK ELDERBERRY PO Take 1 tablet by mouth daily.   cetirizine  10 MG tablet Commonly known as: ZyrTEC  Allergy Take 1 tablet (10 mg total) by mouth daily.   cholecalciferol 25 MCG (1000 UNIT) tablet Commonly known as: VITAMIN D3 Take 1,000 Units by mouth daily.   Dexcom G7 Receiver Devi 1 Device by Does not apply route continuous.   Dexcom G7 Sensor Misc 1 Device by Does not apply route as directed.   diclofenac  Sodium 1 % Gel Commonly known as: VOLTAREN  APPLY 2 GRAMS TO AFFECTED AREA 4 TIMES A DAY   EPINEPHrine  0.3 mg/0.3 mL Soaj injection Commonly known as: EPI-PEN Inject 0.3 mg into the muscle as needed for anaphylaxis.   ferrous gluconate  324 MG tablet Commonly known as: FERGON Take 1 tablet (324 mg total) by mouth daily with breakfast.   Insulin  Pen Needle 32G X 4 MM Misc 1 Device by  Does not apply route daily in the afternoon.   Lantus  SoloStar 100 UNIT/ML Solostar Pen Generic drug: insulin  glargine Inject 54 Units into the skin daily.   levalbuterol 1.25 MG/3ML nebulizer solution Commonly known as: XOPENEX Take 1.25 mg by nebulization as needed for wheezing.   metFORMIN  500 MG tablet Commonly known as: GLUCOPHAGE  TAKE 2 TABLETS BY MOUTH TWICE A DAY   NON FORMULARY Plexus Probio 5 dietary supplement   NON FORMULARY Plexus Slim Hunger Control weight management series   OneTouch Verio Reflect w/Device Kit USE TO TEST BLOOD SUGAR ONCE DAILY IN THE EVENING   OneTouch Verio test strip Generic drug: glucose blood 1 each by Other route 2 (two) times daily. Use as instructed   RABEprazole   20 MG tablet Commonly known as: ACIPHEX  Take 1 tablet (20 mg total) by mouth 2 (two) times daily.        Allergies:  Allergies  Allergen Reactions   Fluticasone-Salmeterol Palpitations   Morphine Palpitations and Other (See Comments)    REACTION: heart racing   Peanut Oil Other (See Comments) and Cough    congested   Peanut-Containing Drug Products Cough   Sulfa Antibiotics Other (See Comments)    REACTION: takes away appetite, makes her feel nervous, jittery   Other Other (See Comments)    Oats and Green peppers---headaches   Penicillins Rash   Sulfamethoxazole Nausea Only   Sulfur Other (See Comments)    out of her body   Montelukast    Pioglitazone      Joint pain   Ciprofloxacin Anxiety   Clindamycin Rash   Cyclobenzaprine Other (See Comments)   Loracarbef Rash        Metronidazole Other (See Comments)   Moxifloxacin Hcl In Nacl Anxiety   Penicillin G Sodium Rash    Past Medical History, Surgical history, Social history, and Family History were reviewed and updated.  Review of Systems: All other 10 point review of systems is negative.   Physical Exam:  vitals were not taken for this visit.   Wt Readings from Last 3 Encounters:  09/04/23 137 lb (62.1 kg)  08/28/23 141 lb 9.6 oz (64.2 kg)  08/25/23 142 lb (64.4 kg)    Ocular: Sclerae unicteric, pupils equal, round and reactive to light Ear-nose-throat: Oropharynx clear, dentition fair Lymphatic: No cervical or supraclavicular adenopathy Lungs no rales or rhonchi, good excursion bilaterally Heart regular rate and rhythm, no murmur appreciated Abd soft, nontender, positive bowel sounds MSK no focal spinal tenderness, no joint edema Neuro: non-focal, well-oriented, appropriate affect Breasts: Deferred  Lab Results  Component Value Date   WBC 9.1 09/27/2023   HGB 13.2 09/27/2023   HCT 39.0 09/27/2023   MCV 89.9 09/27/2023   PLT 202 09/27/2023   Lab Results  Component Value Date   FERRITIN 409  (H) 08/25/2023   IRON 101 08/25/2023   TIBC 318 08/25/2023   UIBC 217 08/25/2023   IRONPCTSAT 32 08/25/2023   Lab Results  Component Value Date   RETICCTPCT 1.8 08/25/2023   RBC 4.34 09/27/2023   RETICCTABS 101.4 03/01/2007   No results found for: KPAFRELGTCHN, LAMBDASER, KAPLAMBRATIO Lab Results  Component Value Date   IGA 371 06/19/2018   No results found for: STEPHANY RINGS, A1GS, A2GS, BETS, BETA2SER, GAMS, MSPIKE, SPEI   Chemistry      Component Value Date/Time   NA 142 08/25/2023 1604   NA 141 11/29/2013 1454   K 5.4 (H) 08/25/2023 1604   K 4.1 04/22/2016 1456  K 3.8 11/29/2013 1454   CL 103 08/25/2023 1604   CL 95 (L) 04/22/2016 1456   CL 98 11/29/2013 1454   CO2 16 (L) 08/25/2023 1604   CO2 21 04/22/2016 1456   CO2 25 11/29/2013 1454   BUN 11 08/25/2023 1604   BUN 12 11/29/2013 1454   CREATININE 0.78 08/25/2023 1604   CREATININE 0.79 07/14/2023 1525   CREATININE 0.55 (L) 04/22/2016 1456   CREATININE 0.8 11/29/2013 1454      Component Value Date/Time   CALCIUM  10.0 08/25/2023 1604   CALCIUM  9.5 04/22/2016 1456   CALCIUM  9.2 11/29/2013 1454   ALKPHOS 104 08/25/2023 1604   ALKPHOS 103 04/22/2016 1456   ALKPHOS 93 (H) 11/29/2013 1454   AST 35 08/25/2023 1604   AST 43 (H) 07/14/2023 1525   ALT 44 (H) 08/25/2023 1604   ALT 51 (H) 07/14/2023 1525   ALT 36 11/29/2013 1454   BILITOT 0.3 08/25/2023 1604   BILITOT 0.4 07/14/2023 1525       Impression and Plan: Ms. Tiedt is a very pleasant 68 yo caucasian female with history of 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 She did not require any adjuvant therapy.  She continues to do well and so far there has been no evidence of recurrence.  CEA in May was 3.29. Today's result pending.  No indication for scans at this time.  She likes come back to see us  so we will see her again in a year.   Lauraine Pepper, NP 8/13/202510:07 AM

## 2023-09-28 ENCOUNTER — Ambulatory Visit: Payer: Self-pay

## 2023-09-28 ENCOUNTER — Telehealth: Payer: Self-pay | Admitting: *Deleted

## 2023-09-28 NOTE — Telephone Encounter (Signed)
 FYI Only or Action Required?: Action required by provider: lab or test result follow-up needed.  Patient was last seen in primary care on 08/31/2023 by Severa Rock HERO, FNP.  Called Nurse Triage reporting Results.   Triage Disposition: No disposition on file.  Patient/caregiver understands and will follow disposition?:        Copied from CRM [id number]. Topic: Clinical - Medical Advice >> Sep 28, 2023 10:19 AM Merlynn LABOR wrote: Reason for CRM: Patient called in regarding results from cancer center regarding potassium levels. Patient stated labs will be sent over by nurse to provider and she will like a call back to advise what patient should do regarding levels. Please contact patient at [phone]. Reason for Disposition  [1] Follow-up call from patient regarding patient's clinical status AND [2] information urgent  Additional Information  Commented on: Answer Assessment    Pt calling to have PCP review labs ordered by specialist. Please advise.  Protocols used: PCP Call - No Triage-A-AH

## 2023-09-28 NOTE — Telephone Encounter (Signed)
 Call received from patient requesting to review lab results from 09/27/23.  Pt concerned about elevated potassium level of 5.2 and requests that lab results be faxed to her PCP, Bari Learn FNP.  Lab results faxed per pt.'s request.

## 2023-09-29 NOTE — Telephone Encounter (Signed)
 Patient aware and verbalized understanding.

## 2023-09-29 NOTE — Telephone Encounter (Signed)
 Potassium elevated. Need to stop any OTC medications.

## 2023-10-03 ENCOUNTER — Encounter: Payer: Self-pay | Admitting: Nutrition

## 2023-10-03 ENCOUNTER — Encounter: Attending: Family | Admitting: Nutrition

## 2023-10-03 VITALS — Ht 61.0 in | Wt 136.0 lb

## 2023-10-03 DIAGNOSIS — Z794 Long term (current) use of insulin: Secondary | ICD-10-CM | POA: Insufficient documentation

## 2023-10-03 DIAGNOSIS — T781XXA Other adverse food reactions, not elsewhere classified, initial encounter: Secondary | ICD-10-CM | POA: Insufficient documentation

## 2023-10-03 DIAGNOSIS — E119 Type 2 diabetes mellitus without complications: Secondary | ICD-10-CM | POA: Diagnosis not present

## 2023-10-03 NOTE — Progress Notes (Signed)
 Medical Nutrition Therapy  Appointment Start time:  1400   Appointment End time 1435  Primary concerns today: Alpha gal and DM Referral diagnosis: T78.1, E11.8 Preferred learning style: See  Learning readiness: Ready  NUTRITION ASSESSMENT 68 yr old wfemale referred for new diagnosis of alpha gal a week ago. Has Type 2 DM. Using the FIG app and that has helped with identifying foods etc to avoid due to alpha gal.  Has done acupuncture in her right ear for alpha gal. Goes Tuesday to get it removed. Overall feels somewhat better but complains of being very weak and poor appetite. Stopped the OTC medications due to elevated Potassium levels. She feels the potassium increased after her iron infusion.  Buying more fruits and vegetables from local farmer Drinking water  Lantus  40 units now from 54 units due to better blood sugars and some low blood sugars.., Metformin  500 mg mg. Complains of being very weak and no energy. A1C 7.0%., improved from 7.8%   Wt Readings from Last 3 Encounters:  10/03/23 136 lb (61.7 kg)  09/27/23 134 lb (60.8 kg)  09/04/23 137 lb (62.1 kg)   Ht Readings from Last 3 Encounters:  10/03/23 5' 1 (1.549 m)  09/27/23 5' 1 (1.549 m)  09/04/23 5' 1 (1.549 m)   Body mass index is 25.7 kg/m. @BMIFA @ Facility age limit for growth %iles is 20 years. Facility age limit for growth %iles is 20 years.  Clinical Medical Hx:  Past Medical History:  Diagnosis Date   Allergy    Arthritis    DDD   Asthma    Colon cancer (HCC) 2008   Diabetes mellitus    Elevated liver enzymes    GERD (gastroesophageal reflux disease)    History of thyroiditis 12/18/2007   Formatting of this note might be different from the original. Thyroiditis   Hyperlipidemia    Iron deficiency anemia due to chronic blood loss 09/17/2020   Thyroid  tumor, benign     Medications:  Current Outpatient Medications on File Prior to Visit  Medication Sig Dispense Refill   albuterol  (VENTOLIN  HFA)  108 (90 Base) MCG/ACT inhaler Inhale 1-2 puffs into the lungs every 4 (four) hours as needed. For shortness for breath 18 g 11   BLACK ELDERBERRY PO Take 1 tablet by mouth daily.     Blood Glucose Monitoring Suppl (ONETOUCH VERIO REFLECT) w/Device KIT USE TO TEST BLOOD SUGAR ONCE DAILY IN THE EVENING 1 kit 0   cetirizine  (ZYRTEC  ALLERGY) 10 MG tablet Take 1 tablet (10 mg total) by mouth daily. 90 tablet 1   cholecalciferol (VITAMIN D ) 25 MCG (1000 UT) tablet Take 1,000 Units by mouth daily. (Patient not taking: Reported on 09/27/2023)     Continuous Glucose Receiver (DEXCOM G7 RECEIVER) DEVI 1 Device by Does not apply route continuous. 1 each 0   Continuous Glucose Sensor (DEXCOM G7 SENSOR) MISC 1 Device by Does not apply route as directed. 9 each 3   diclofenac  Sodium (VOLTAREN ) 1 % GEL APPLY 2 GRAMS TO AFFECTED AREA 4 TIMES A DAY 200 g 2   EPINEPHrine  0.3 mg/0.3 mL IJ SOAJ injection Inject 0.3 mg into the muscle as needed for anaphylaxis. (Patient not taking: Reported on 09/27/2023) 1 each 11   ferrous gluconate  (FERGON) 324 MG tablet Take 1 tablet (324 mg total) by mouth daily with breakfast. (Patient not taking: Reported on 09/27/2023) 60 tablet 3   glucose blood (ONETOUCH VERIO) test strip 1 each by Other route 2 (two) times  daily. Use as instructed 200 each 3   insulin  glargine (LANTUS  SOLOSTAR) 100 UNIT/ML Solostar Pen Inject 54 Units into the skin daily. 30 mL 1   Insulin  Pen Needle 32G X 4 MM MISC 1 Device by Does not apply route daily in the afternoon. 100 each 3   levalbuterol (XOPENEX) 1.25 MG/3ML nebulizer solution Take 1.25 mg by nebulization as needed for wheezing.     metFORMIN  (GLUCOPHAGE ) 500 MG tablet TAKE 2 TABLETS BY MOUTH TWICE A DAY 120 tablet 2   Mometasone Furoate  (ASMANEX  HFA) 200 MCG/ACT AERO Inhale 1 puff into the lungs in the morning and at bedtime. 1 each 11   NON FORMULARY Plexus Probio 5 dietary supplement (Patient not taking: Reported on 09/27/2023)     NON FORMULARY  Plexus Slim Hunger Control weight management series (Patient not taking: Reported on 09/27/2023)     RABEprazole  (ACIPHEX ) 20 MG tablet Take 1 tablet (20 mg total) by mouth 2 (two) times daily. 180 tablet 3   Spacer/Aero-Holding Chambers (AEROCHAMBER PLUS WITH MASK) inhaler Use as directed 1 each 11   No current facility-administered medications on file prior to visit.    Labs:  Notable Signs/Symptoms: knees hurting  Lifestyle & Dietary Hx Lives with her husband.  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  As evidenced by positive for Alpha Gal..   NUTRITION INTERVENTION  Nutrition education (E-1) on the following topics:  Alpha Gal and diet restrictins  Handouts Provided Include  Alpha Gal diet restrictions  Learning Style & Readiness for Change Teaching method utilized: Visual & Auditory  Demonstrated degree of understanding via: Teach Back  Barriers to learning/adherence to lifestyle change: None  Goals Established by Pt Goals  Keep up the great job. Continue to use the FIG app and avoid foods from 4 legged animals. Talk to MD about ex trended release Metformin . Eat 3 meals per day Increase lower carb vegetables with dinner meal.  DInner idea Chicken, broccoli, whole sweet potato and a piece of fruit Make sure  you have a protein source-- beans, pea, lentils or chicken/fish/tukey at each meal Eat 1 piece of fruit at each meal Eat 1-2 low carb vegetables with lunch and dinner daily to meet your nutritional  needs MONITORING & EVALUATION Dietary intake, weekly physical activity, and  3 month.  Next Steps  Patient is to follow diet restrictions for alpha gal and diabetic meal plan.

## 2023-10-03 NOTE — Patient Instructions (Signed)
 Goals  Keep up the great job. Continue to use the FIG app and avoid foods from 4 legged animals. Talk to MD about ex trended release Metformin . Eat 3 meals per day Increase lower carb vegetables with dinner meal.  DInner idea Chicken, broccoli, whole sweet potato and a piece of fruit Make sure  you have a protein source-- beans, pea, lentils or chicken/fish/tukey at each meal Eat 1 piece of fruit at each meal Eat 1-2 low carb vegetables with lunch and dinner daily to meet your nutritional needs

## 2023-10-05 ENCOUNTER — Telehealth: Payer: Self-pay | Admitting: Gastroenterology

## 2023-10-05 ENCOUNTER — Encounter: Payer: Self-pay | Admitting: Nutrition

## 2023-10-05 ENCOUNTER — Other Ambulatory Visit: Payer: Self-pay | Admitting: Internal Medicine

## 2023-10-05 NOTE — Telephone Encounter (Signed)
 Inbound call from patient stating she recently diagnosised with a tick disease. States she has been avoiding red meat but still having bloating and abdominal pain. Patient is requesting a call to discuss medication that may help that does not contain mammal products. Please advise, thank you

## 2023-10-05 NOTE — Telephone Encounter (Signed)
 Spoke with patient. She reports that she has been diagnosed with alpha-gal after a tick bite. She states she has been avoiding all of the foods and medications that she was instructed to stop. She reports she is still having bloating every time she eats. She did have to stop taking her probiotic and she is wondering if this has something to do with it. Would like to know if there is something safe that she can take to help with the bloating.

## 2023-10-05 NOTE — Telephone Encounter (Signed)
 Spoke with patient. She reports that her capsule is mammal and she is not able to take the probiotic or any of her other supplements because of this. She would like to know if provider has any recommendations on a probiotic with a vegan capsule

## 2023-10-05 NOTE — Telephone Encounter (Signed)
 Refill request complete

## 2023-10-06 MED ORDER — ONDANSETRON 4 MG PO TBDP: 4.0000 mg | ORAL_TABLET | Freq: Every day | ORAL | 1 refills | Status: DC | PRN

## 2023-10-06 NOTE — Addendum Note (Signed)
 Addended by: Cristyn Crossno N on: 10/06/2023 02:51 PM   Modules accepted: Orders

## 2023-10-06 NOTE — Telephone Encounter (Signed)
 Inbound call from patient stating she would like to speak to nurse in regards to medication Xofran since she has been having trouble eating. Patient states she's been nauseous and would like to know if she can have that medication in liquid form without mammal   Requesting a call back  Please advise  Thank you

## 2023-10-06 NOTE — Telephone Encounter (Signed)
RX sent. Patient notified via MyChart. 

## 2023-10-06 NOTE — Telephone Encounter (Signed)
 Please send Zofran  ODT 4mg  daily as needed X 30, not sure if they have liquid zofran 

## 2023-10-06 NOTE — Telephone Encounter (Signed)
 Received a call from patient, states she would like fu call with nurse regarding Alpha-gal, and prescriptions with no mammal. Please review and advise   Thank you

## 2023-10-06 NOTE — Telephone Encounter (Signed)
 Patient returning phone call, wishing for deeper clarification. Please review and advise  Thank you

## 2023-10-09 ENCOUNTER — Encounter: Payer: Self-pay | Admitting: Internal Medicine

## 2023-10-09 ENCOUNTER — Ambulatory Visit: Admitting: Internal Medicine

## 2023-10-09 VITALS — BP 122/82 | HR 112 | Ht 61.0 in | Wt 133.0 lb

## 2023-10-09 DIAGNOSIS — Z794 Long term (current) use of insulin: Secondary | ICD-10-CM

## 2023-10-09 DIAGNOSIS — E113299 Type 2 diabetes mellitus with mild nonproliferative diabetic retinopathy without macular edema, unspecified eye: Secondary | ICD-10-CM | POA: Diagnosis not present

## 2023-10-09 MED ORDER — ONDANSETRON HCL 4 MG/5ML PO SOLN: 4.0000 mg | Freq: Every day | ORAL | 2 refills | Status: AC | PRN

## 2023-10-09 MED ORDER — METFORMIN HCL ER 500 MG PO TB24: 1000.0000 mg | ORAL_TABLET | Freq: Two times a day (BID) | ORAL | 3 refills | Status: DC

## 2023-10-09 MED ORDER — INSULIN PEN NEEDLE 32G X 4 MM MISC: 1.0000 | Freq: Every day | 3 refills | Status: AC

## 2023-10-09 MED ORDER — LANTUS SOLOSTAR 100 UNIT/ML ~~LOC~~ SOPN: 20.0000 [IU] | PEN_INJECTOR | Freq: Every day | SUBCUTANEOUS | 4 refills | Status: DC

## 2023-10-09 NOTE — Patient Instructions (Addendum)
 Continue  Lantus  20 units daily  Increase Metformin  500 mg, 2 tablets twice daily    HOW TO TREAT LOW BLOOD SUGARS (Blood sugar LESS THAN 70 MG/DL) Please follow the RULE OF 15 for the treatment of hypoglycemia treatment (when your (blood sugars are less than 70 mg/dL)   STEP 1: Take 15 grams of carbohydrates when your blood sugar is low, which includes:  3-4 GLUCOSE TABS  OR 3-4 OZ OF JUICE OR REGULAR SODA OR ONE TUBE OF GLUCOSE GEL    STEP 2: RECHECK blood sugar in 15 MINUTES STEP 3: If your blood sugar is still low at the 15 minute recheck --> then, go back to STEP 1 and treat AGAIN with another 15 grams of carbohydrates.

## 2023-10-09 NOTE — Addendum Note (Signed)
 Addended by: Kydan Shanholtzer N on: 10/09/2023 10:00 AM   Modules accepted: Orders

## 2023-10-09 NOTE — Progress Notes (Signed)
 Name: Stephanie Carroll  MRN/ DOB: [id number], [birth year]   Age/ Sex: 68 y.o., female    PCP: Lavell Bari LABOR, FNP   Reason for Endocrinology Evaluation: Type 2 Diabetes Mellitus     Date of Initial Endocrinology Visit: 10/29/2021    PATIENT IDENTIFIER: Ms. Stephanie Carroll Stephanie Carroll is a 68 y.o. female with a past medical history of T2DM, Asthma, HTN and dyslipidemia, Hx of colon cancer. . The patient presented for initial endocrinology clinic visit on 10/29/2021 for consultative assistance with her diabetes management.    HPI: Ms. Stephanie Carroll was    Diagnosed with DM 2010 Prior Medications tried/Intolerance: She was prescribed Jardiance   but did not take it.              Hemoglobin A1c ranging from   7.9% in 2023, peaking at 9.4% in 2022   She was seen by [facility] 06/2021  On her initial visit to our clinic she had an A1c of 7.9%, she was on metformin  and basal insulin  only, will increase basal insulin    Started pioglitazone  03/2022 with an A1c 9.3% but discontinued due to severe joint pains   She opted to try Ozempic  because her brother had positive results with that 09/2022 but she did not start it as she was scared of side effects of pancreatitis   LEFT THYROID  NODULE : She is S/P FNA of the left thyroid  nodule 2.7 cm nodule in 2011 . Ultrasound showed stability in 2023  SUBJECTIVE:   During the last visit (05/26/2023): A1c 7.8%   Today (10/09/23): Stephanie Carroll is here for a follow up on diabetes management.   He checks her blood sugars 2 times daily. The patient has had hypoglycemic episodes since the last clinic visit  She did meet registered dietitian She continues to follow-up with oncology due to history of colon cancer She has hx of chronic abdominal issues due to hx of colon cancer.  She has reduced Lantus  from 16 units to 20 units due to hypoglycemia She also has decreased metformin  from 4 tablets a day to 2 tablets a day due to hypoglycemia  She did develop meat / dairy allergy due  to tick bite  She has been noted with weight loss  Continues with abdominal pain and bloating  Denies constipation but continues with mild diarrhea      HOME DIABETES REGIMEN: Metformin  500 mg XR , 2 tabs BID-takes 2 tablets daily Lantus  60 units daily -takes 20 units daily     Statin: yes ACE-I/ARB: no   CONTINUOUS GLUCOSE MONITORING RECORD INTERPRETATION    Dates of Recording: 8/12 - 10/09/2023  Sensor description: Dexcom  Results statistics:   CGM use % of time 97  Average and SD 142/41  Time in range      82%  % Time Above 180 17  % Time above 250 1  % Time Below target 0   Glycemic patterns summary: BGs are optimal throughout the night and most of the day  Hyperglycemic episodes postprandial  Hypoglycemic episodes occurred overnight  Overnight periods: Trends down   DIABETIC COMPLICATIONS: Microvascular complications:  Mild nonproliferative DR Denies: CKD Last eye exam: Completed 09/06/2022  Macrovascular complications:   Denies: CAD, PVD, CVA   PAST HISTORY: Past Medical History:  Past Medical History:  Diagnosis Date   Allergy    Arthritis    DDD   Asthma    Colon cancer (HCC) 2008   Diabetes mellitus    Elevated liver enzymes  GERD (gastroesophageal reflux disease)    History of thyroiditis 12/18/2007   Formatting of this note might be different from the original. Thyroiditis   Hyperlipidemia    Iron deficiency anemia due to chronic blood loss 09/17/2020   Thyroid  tumor, benign    Past Surgical History:  Past Surgical History:  Procedure Laterality Date   BIOPSY THYROID      X2   CESAREAN SECTION     x 1   COLON SURGERY     FOOT FRACTURE SURGERY Left 02/2015   INCISIONAL HERNIA REPAIR     NASAL SEPTUM SURGERY     PARTIAL HYSTERECTOMY     POLYPECTOMY     UPPER GASTROINTESTINAL ENDOSCOPY     WRIST GANGLION EXCISION     left    Social History:  reports that she has never smoked. She has never used smokeless tobacco. She reports  that she does not drink alcohol and does not use drugs. Family History:  Family History  Problem Relation Age of Onset   COPD Other        both sides   Diabetes Other        both sides   Heart disease Father    Kidney failure Father    Lung cancer Paternal Uncle    Leukemia Paternal Grandfather    Cervical cancer Paternal Aunt        x 2    Heart defect Mother        MVP   Breast cancer Mother    Colon cancer Neg Hx    Esophageal cancer Neg Hx    Rectal cancer Neg Hx    Stomach cancer Neg Hx      HOME MEDICATIONS: Allergies as of 10/09/2023       Reactions   Fluticasone-salmeterol Palpitations   Morphine Palpitations, Other (See Comments)   REACTION: heart racing   Peanut Oil Other (See Comments), Cough   congested   Peanut-containing Drug Products Cough   Sulfa Antibiotics Other (See Comments)   REACTION: takes away appetite, makes her feel nervous, jittery   Other Other (See Comments)   Oats and Green peppers---headaches   Penicillins Rash   Sulfamethoxazole Nausea Only   Sulfur Other (See Comments)   out of her body   Montelukast    Pioglitazone     Joint pain   Ciprofloxacin Anxiety   Clindamycin Rash   Cyclobenzaprine Other (See Comments)   Loracarbef Rash      Metronidazole Other (See Comments)   Moxifloxacin Hcl In Nacl Anxiety   Penicillin G Sodium Rash        Medication List        Accurate as of October 09, 2023  1:52 PM. If you have any questions, ask your nurse or doctor.          Aerochamber Plus Device Use as directed   albuterol  108 (90 Base) MCG/ACT inhaler Commonly known as: VENTOLIN  HFA Inhale 1-2 puffs into the lungs every 4 (four) hours as needed. For shortness for breath   Asmanex  HFA 200 MCG/ACT Aero Generic drug: Mometasone Furoate  Inhale 1 puff into the lungs in the morning and at bedtime.   BLACK ELDERBERRY PO Take 1 tablet by mouth daily.   cetirizine  10 MG tablet Commonly known as: ZyrTEC  Allergy Take 1  tablet (10 mg total) by mouth daily.   cholecalciferol 25 MCG (1000 UNIT) tablet Commonly known as: VITAMIN D3 Take 1,000 Units by mouth daily.   Dexcom G7 Receiver  Devi 1 Device by Does not apply route continuous.   Dexcom G7 Sensor Misc 1 Device by Does not apply route as directed.   diclofenac  Sodium 1 % Gel Commonly known as: VOLTAREN  APPLY 2 GRAMS TO AFFECTED AREA 4 TIMES A DAY   EPINEPHrine  0.3 mg/0.3 mL Soaj injection Commonly known as: EPI-PEN Inject 0.3 mg into the muscle as needed for anaphylaxis.   ferrous gluconate  324 MG tablet Commonly known as: FERGON Take 1 tablet (324 mg total) by mouth daily with breakfast.   Insulin  Pen Needle 32G X 4 MM Misc 1 Device by Does not apply route daily in the afternoon.   Lantus  SoloStar 100 UNIT/ML Solostar Pen Generic drug: insulin  glargine Inject 54 Units into the skin daily. What changed:  how much to take additional instructions   levalbuterol 1.25 MG/3ML nebulizer solution Commonly known as: XOPENEX Take 1.25 mg by nebulization as needed for wheezing.   metFORMIN  500 MG tablet Commonly known as: GLUCOPHAGE  TAKE 2 TABLETS BY MOUTH TWICE A DAY What changed: when to take this   NON FORMULARY Plexus Probio 5 dietary supplement   NON FORMULARY Plexus Slim Hunger Control weight management series   ondansetron  4 MG/5ML solution Commonly known as: ZOFRAN  Take 5 mLs (4 mg total) by mouth daily as needed for nausea or vomiting.   OneTouch Verio Reflect w/Device Kit USE TO TEST BLOOD SUGAR ONCE DAILY IN THE EVENING   OneTouch Verio test strip Generic drug: glucose blood USE TO CHECK BLOOD SUGAR 2 (TWO) TIMES DAILY. USE AS INSTRUCTED   RABEprazole  20 MG tablet Commonly known as: ACIPHEX  Take 1 tablet (20 mg total) by mouth 2 (two) times daily.         ALLERGIES: Allergies  Allergen Reactions   Fluticasone-Salmeterol Palpitations   Morphine Palpitations and Other (See Comments)    REACTION: heart racing    Peanut Oil Other (See Comments) and Cough    congested   Peanut-Containing Drug Products Cough   Sulfa Antibiotics Other (See Comments)    REACTION: takes away appetite, makes her feel nervous, jittery   Other Other (See Comments)    Oats and Green peppers---headaches   Penicillins Rash   Sulfamethoxazole Nausea Only   Sulfur Other (See Comments)    out of her body   Montelukast    Pioglitazone      Joint pain   Ciprofloxacin Anxiety   Clindamycin Rash   Cyclobenzaprine Other (See Comments)   Loracarbef Rash        Metronidazole Other (See Comments)   Moxifloxacin Hcl In Nacl Anxiety   Penicillin G Sodium Rash     REVIEW OF SYSTEMS: A comprehensive ROS was conducted with the patient and is negative except as per HPI    OBJECTIVE:   VITAL SIGNS: BP 122/82 (BP Location: Left Arm, Patient Position: Sitting, Cuff Size: Normal)   Pulse (!) 112   Ht 5' 1 (1.549 m)   Wt 133 lb (60.3 kg)   SpO2 99%   BMI 25.13 kg/m    PHYSICAL EXAM:  General: Pt appears well and is in NAD  Lungs: Clear with good BS bilat with no rales, rhonchi, or wheezes  Heart: RRR   Extremities:  Lower extremities - No pretibial edema.   Neuro: MS is good with appropriate affect, pt is alert and Ox3   DM Foot Exam 10/09/2023  The skin of the feet is intact without sores or ulcerations. The pedal pulses are 2+ on right and 2+ on  left. The sensation is intact to a screening 5.07, 10 gram monofilament bilaterally   DATA REVIEWED:  Lab Results  Component Value Date   HGBA1C 7.0 (H) 09/27/2023   HGBA1C 7.8 (A) 05/26/2023   HGBA1C 7.1 (A) 10/10/2022    Latest Reference Range & Units 09/27/23 09:54  Sodium 135 - 145 mmol/L 136  Potassium 3.5 - 5.1 mmol/L 5.2 (H)  Chloride 98 - 111 mmol/L 102  CO2 22 - 32 mmol/L 21 (L)  Glucose 70 - 99 mg/dL 789 (H)  Mean Plasma Glucose mg/dL 845  BUN 8 - 23 mg/dL 17  Creatinine 9.55 - 8.99 mg/dL 8.90 (H)  Calcium  8.9 - 10.3 mg/dL 9.7  Anion gap 5 - 15   14  Alkaline Phosphatase 38 - 126 U/L 85  Albumin 3.5 - 5.0 g/dL 4.5  AST 15 - 41 U/L 33  ALT 0 - 44 U/L 34  Total Protein 6.5 - 8.1 g/dL 7.7  Total Bilirubin 0.0 - 1.2 mg/dL 0.4  GFR, Est Non African American >60 mL/min 56 (L)     Old records , labs and images have been reviewed.   ASSESSMENT / PLAN / RECOMMENDATIONS:   1) Type 2 Diabetes Mellitus, Optimally controlled, With retinopathic  complications - Most recent A1c of 7.0 %. Goal A1c < 7.0 %.    -A1c has trended down to optimal levels, this is most likely due to a recent diagnosis of alpha gal syndrome which has resulted in a lot of dietary changes -She has history of chronic chronic GI issues since her colon cancer treatment, her brother is on Ozempic  she initially requested to try it, but became skeptical about side effects -She stopped pioglitazone  due to severe joint pains - She has recently decreased Lantus  due to hypoglycemia, will not change this at this time -I did advise the patient to increase metformin  again, I did explain to the patient that metformin  is not associated with hypoglycemia and hypoglycemia is due to insulin     MEDICATIONS:   Continue Lantus  20 units daily Increase metformin  500 mg, 2 tabs twice daily  EDUCATION / INSTRUCTIONS: BG monitoring instructions: Patient is instructed to check her blood sugars 2 times a day. Call [facility] if: BG persistently < 70  I reviewed the Rule of 15 for the treatment of hypoglycemia in detail with the patient. Literature supplied.   2) Diabetic complications:  Eye: Does  have known diabetic retinopathy.  Neuro/ Feet: Does not have known diabetic peripheral neuropathy. Renal: Patient does not at have known baseline CKD. She is  on an ACEI/ARB at present.  3) Dyslipidemia:   -LDL above goal at 124 mg/DL, she was prescribed Crestor  by her PCP but developed arthralgias and discontinued September, 2024   Follow-up in 4 months   I spent 25  minutes preparing to see the patient by review of recent labs, imaging and procedures, obtaining and reviewing separately obtained history, communicating with the patient ordering medications, tests or procedures, and documenting clinical information in the EHR including the differential Dx, treatment, and any further evaluation and other management    Signed electronically by: Stefano Redgie Butts, MD  [facility] End[facility]  [facility] [address] [place] [place], [place] [address] Phone: [phone] FAX: [phone]   CC: Lavell Bari LABOR, FNP [address] [place] [address] Phone: [phone]  Fax: [phone]    Return to Endocrinology clinic as below: Future Appointments  Date Time Provider Department Center  11/23/2023 10:40 AM [facility]NUAL WELLNESS VISIT [facility] None  12/22/2023 11:00 AM [facility] None  01/03/2024  1:30 PM Kristine Shuck D, RD [facility] None  07/12/2024 12:00 PM [facility] None  07/12/2024 12:15 PM Ennever, Maude SAUNDERS, MD [facility] None

## 2023-10-10 ENCOUNTER — Encounter: Payer: Self-pay | Admitting: Internal Medicine

## 2023-10-12 ENCOUNTER — Other Ambulatory Visit: Payer: Self-pay | Admitting: Internal Medicine

## 2023-10-12 MED ORDER — METFORMIN HCL 500 MG PO TABS: 1000.0000 mg | ORAL_TABLET | Freq: Two times a day (BID) | ORAL | 3 refills | Status: AC

## 2023-10-26 ENCOUNTER — Ambulatory Visit: Payer: Self-pay

## 2023-10-26 NOTE — Telephone Encounter (Signed)
 Refused NT, see notes below. Would like labs ordered.   Copied from CRM [id number]. Topic: Clinical - Red Word Triage >> Oct 26, 2023 12:19 PM Stephanie Carroll wrote: Red Word that prompted transfer to Nurse Triage: Pt is experiencing fatigue and sob Answer Assessment - Initial Assessment Questions 1. RESPIRATORY STATUS: Describe your breathing? (e.g., wheezing, shortness of breath, unable to speak, severe coughing)      Difficulty breathing 2. ONSET: When did this breathing problem begin?      Intermittently since dx of alpha gal, hx of asthma as well 3. PATTERN Does the difficult breathing come and go, or has it been constant since it started?      Intermittently  At this point in the triage questions pt states I just want labwork. RN advised pt that a request for lab orders will be made to the PCP at pt request. Triage was not completed, no disposition given.  Protocols used: Breathing Difficulty-A-AH

## 2023-10-26 NOTE — Telephone Encounter (Signed)
 Appt scheduled for 10/27/2023

## 2023-10-26 NOTE — Telephone Encounter (Signed)
 Patient can not just have lab work she will have to see someone can be on dod

## 2023-10-27 ENCOUNTER — Encounter: Payer: Self-pay | Admitting: Family

## 2023-10-27 ENCOUNTER — Ambulatory Visit (INDEPENDENT_AMBULATORY_CARE_PROVIDER_SITE_OTHER): Admitting: Family

## 2023-10-27 VITALS — BP 136/67 | HR 110 | Temp 97.7°F | Ht 61.0 in | Wt 133.6 lb

## 2023-10-27 DIAGNOSIS — Z0000 Encounter for general adult medical examination without abnormal findings: Secondary | ICD-10-CM | POA: Diagnosis not present

## 2023-10-27 DIAGNOSIS — D500 Iron deficiency anemia secondary to blood loss (chronic): Secondary | ICD-10-CM

## 2023-10-27 DIAGNOSIS — K219 Gastro-esophageal reflux disease without esophagitis: Secondary | ICD-10-CM | POA: Diagnosis not present

## 2023-10-27 DIAGNOSIS — W57XXXS Bitten or stung by nonvenomous insect and other nonvenomous arthropods, sequela: Secondary | ICD-10-CM | POA: Diagnosis not present

## 2023-10-27 DIAGNOSIS — E559 Vitamin D deficiency, unspecified: Secondary | ICD-10-CM | POA: Diagnosis not present

## 2023-10-27 DIAGNOSIS — F411 Generalized anxiety disorder: Secondary | ICD-10-CM

## 2023-10-27 DIAGNOSIS — S30861S Insect bite (nonvenomous) of abdominal wall, sequela: Secondary | ICD-10-CM | POA: Diagnosis not present

## 2023-10-27 DIAGNOSIS — E1169 Type 2 diabetes mellitus with other specified complication: Secondary | ICD-10-CM | POA: Diagnosis not present

## 2023-10-27 DIAGNOSIS — R5383 Other fatigue: Secondary | ICD-10-CM | POA: Diagnosis not present

## 2023-10-27 DIAGNOSIS — E538 Deficiency of other specified B group vitamins: Secondary | ICD-10-CM

## 2023-10-27 DIAGNOSIS — J4530 Mild persistent asthma, uncomplicated: Secondary | ICD-10-CM | POA: Diagnosis not present

## 2023-10-27 DIAGNOSIS — Z91018 Allergy to other foods: Secondary | ICD-10-CM

## 2023-10-27 DIAGNOSIS — E113299 Type 2 diabetes mellitus with mild nonproliferative diabetic retinopathy without macular edema, unspecified eye: Secondary | ICD-10-CM

## 2023-10-27 DIAGNOSIS — S30861D Insect bite (nonvenomous) of abdominal wall, subsequent encounter: Secondary | ICD-10-CM

## 2023-10-27 DIAGNOSIS — I152 Hypertension secondary to endocrine disorders: Secondary | ICD-10-CM | POA: Diagnosis not present

## 2023-10-27 DIAGNOSIS — E785 Hyperlipidemia, unspecified: Secondary | ICD-10-CM | POA: Diagnosis not present

## 2023-10-27 DIAGNOSIS — E1159 Type 2 diabetes mellitus with other circulatory complications: Secondary | ICD-10-CM | POA: Diagnosis not present

## 2023-10-27 DIAGNOSIS — Z0001 Encounter for general adult medical examination with abnormal findings: Secondary | ICD-10-CM | POA: Diagnosis not present

## 2023-10-27 DIAGNOSIS — W57XXXD Bitten or stung by nonvenomous insect and other nonvenomous arthropods, subsequent encounter: Secondary | ICD-10-CM

## 2023-10-27 DIAGNOSIS — Z794 Long term (current) use of insulin: Secondary | ICD-10-CM

## 2023-10-27 LAB — LIPID PANEL

## 2023-10-27 NOTE — Progress Notes (Signed)
 Subjective:    Patient ID: Stephanie Carroll, female    DOB: 08-Nov-1955, 68 y.o.   MRN: [id number]  Chief Complaint  Patient presents with   Fatigue   Shortness of Breath   PT presents to the office today for CPE and chronic follow up.   It has been over a year since we have seen each other. Reports she has been caring for her mother and husband.   She is followed by Endocrinologists every 4 months for diabetes.    She is followed by asthma and allergen specialists annually.   Followed by GI annually for GERD and fatty liver. Has hx of colon cancer and has colonoscopy every 3 years.    She is followed by Oncologists every 6 months for hx colon cancer. Gets iron infusion.   She has had about 6  tick bites on her lower abdomen. She is positive for alpha gal. Seeing a nutritionists. This has caused anxiety for her.   Hypertension This is a chronic problem. The current episode started more than 1 year ago. The problem has been resolved since onset. The problem is controlled. Associated symptoms include anxiety, blurred vision, malaise/fatigue and shortness of breath. Pertinent negatives include no peripheral edema. Risk factors for coronary artery disease include diabetes mellitus, dyslipidemia, sedentary lifestyle and obesity. The current treatment provides moderate improvement.  Asthma She complains of shortness of breath. There is no cough, hemoptysis or wheezing. This is a chronic problem. The current episode started more than 1 year ago. The problem occurs intermittently. Associated symptoms include heartburn and malaise/fatigue. She reports moderate improvement on treatment. Her past medical history is significant for asthma.  Gastroesophageal Reflux She complains of belching and heartburn. She reports no coughing or no wheezing. This is a chronic problem. The current episode started more than 1 year ago. The problem occurs rarely. The symptoms are aggravated by certain foods. She has tried  a diet change for the symptoms. The treatment provided no relief.  Diabetes She presents for her follow-up diabetic visit. She has type 2 diabetes mellitus. Hypoglycemia symptoms include nervousness/anxiousness. Associated symptoms include blurred vision. Pertinent negatives for diabetes include no foot paresthesias. Symptoms are stable. Risk factors for coronary artery disease include dyslipidemia, diabetes mellitus, hypertension, sedentary lifestyle and post-menopausal. She is following a generally healthy diet. Her overall blood glucose range is 140-180 mg/dl.  Anxiety Presents for follow-up visit. Symptoms include excessive worry, nervous/anxious behavior and shortness of breath. Symptoms occur occasionally. The severity of symptoms is mild.   Her past medical history is significant for asthma.  Arthritis Presents for follow-up visit. She complains of pain and stiffness. The symptoms have been stable. Affected locations include the right MCP. Her pain is at a severity of 3/10.  Shortness of Breath Pertinent negatives include no hemoptysis or wheezing. Her past medical history is significant for asthma.      Review of Systems  Constitutional:  Positive for malaise/fatigue.  Eyes:  Positive for blurred vision.  Respiratory:  Positive for shortness of breath. Negative for cough, hemoptysis and wheezing.   Gastrointestinal:  Positive for heartburn.  Musculoskeletal:  Positive for stiffness.  Psychiatric/Behavioral:  The patient is nervous/anxious.   All other systems reviewed and are negative.  Family History  Problem Relation Age of Onset   COPD Other        both sides   Diabetes Other        both sides   Heart disease Father    Kidney  failure Father    Lung cancer Paternal Uncle    Leukemia Paternal Grandfather    Cervical cancer Paternal Aunt        x 2    Heart defect Mother        MVP   Breast cancer Mother    Colon cancer Neg Hx    Esophageal cancer Neg Hx    Rectal  cancer Neg Hx    Stomach cancer Neg Hx    Social History   Socioeconomic History   Marital status: Married    Spouse name: Not on file   Number of children: 2   Years of education: Not on file   Highest education level: Not on file  Occupational History   Occupation: claims for [employer]    Employer: [employer]  Tobacco Use   Smoking status: Never   Smokeless tobacco: Never   Tobacco comments:    never used product  Vaping Use   Vaping status: Never Used  Substance and Sexual Activity   Alcohol use: No    Alcohol/week: 0.0 standard drinks of alcohol   Drug use: No   Sexual activity: Not on file  Other Topics Concern   Not on file  Social History Narrative   Not on file   Social Drivers of Health   Financial Resource Strain: Low Risk  (11/22/2022)   Overall Financial Resource Strain (CARDIA)    Difficulty of Paying Living Expenses: Not hard at all  Food Insecurity: No Food Insecurity (06/28/2021)   Received from [facility]   Hunger Vital Sign    Within the past 12 months, you worried that your food would run out before you got the money to buy more.: Never true    Within the past 12 months, the food you bought just didn't last and you didn't have money to get more.: Never true  Transportation Needs: No Transportation Needs (11/22/2022)   PRAPARE - Administrator, Civil Service (Medical): No    Lack of Transportation (Non-Medical): No  Physical Activity: Insufficiently Active (11/22/2022)   Exercise Vital Sign    Days of Exercise per Week: 7 days    Minutes of Exercise per Session: 20 min  Stress: No Stress Concern Present (11/22/2022)   [employer] of Occupational Health - Occupational Stress Questionnaire    Feeling of Stress : Not at all  Social Connections: Socially Integrated (11/22/2022)   Social Connection and Isolation Panel    Frequency of Communication with Friends and Family: More than three times a week    Frequency of Social Gatherings with  Friends and Family: More than three times a week    Attends Religious Services: 1 to 4 times per year    Active Member of [employer] or Organizations: Yes    Attends Engineer, structural: More than 4 times per year    Marital Status: Married        Objective:   Physical Exam Vitals reviewed.  Constitutional:      General: She is not in acute distress.    Appearance: She is well-developed. She is obese.  HENT:     Head: Normocephalic and atraumatic.     Right Ear: Tympanic membrane normal.     Left Ear: Tympanic membrane normal.  Eyes:     Pupils: Pupils are equal, round, and reactive to light.  Neck:     Thyroid : No thyromegaly.  Cardiovascular:     Rate and Rhythm: Normal rate and  regular rhythm.     Heart sounds: Normal heart sounds. No murmur heard. Pulmonary:     Effort: Pulmonary effort is normal. No respiratory distress.     Breath sounds: Normal breath sounds. No wheezing.  Abdominal:     General: Bowel sounds are normal. There is no distension.     Palpations: Abdomen is soft.     Tenderness: There is no abdominal tenderness.  Musculoskeletal:        General: No tenderness. Normal range of motion.     Cervical back: Normal range of motion and neck supple.  Skin:    General: Skin is warm and dry.  Neurological:     Mental Status: She is alert and oriented to person, place, and time.     Cranial Nerves: No cranial nerve deficit.     Deep Tendon Reflexes: Reflexes are normal and symmetric.  Psychiatric:        Behavior: Behavior normal.        Thought Content: Thought content normal.        Judgment: Judgment normal.       BP 136/67   Pulse (!) 110   Temp 97.7 F (36.5 C)   Ht 5' 1 (1.549 m)   Wt 133 lb 9.6 oz (60.6 kg)   SpO2 100%   BMI 25.24 kg/m      Assessment & Plan:  Stephanie Carroll comes in today with chief complaint of Fatigue and Shortness of Breath   Diagnosis and orders addressed:  1. Annual physical exam (Primary) - Anemia  Profile B - CMP14+EGFR - Lipid panel - VITAMIN D  25 Hydroxy (Vit-D Deficiency, Fractures) - TSH - Lyme Disease Serology w/Reflex  2. Gastro-esophageal reflux disease without esophagitis - CMP14+EGFR  3. Chronic asthma, mild persistent, uncomplicated - CMP14+EGFR  4. Iron deficiency anemia due to chronic blood loss - Anemia Profile B - CMP14+EGFR  5. Hyperlipidemia associated with type 2 diabetes mellitus (HCC) - CMP14+EGFR - Lipid panel  6. Hypertension associated with type 2 diabetes mellitus (HCC) - CMP14+EGFR  7. Vitamin D  deficiency - CMP14+EGFR - VITAMIN D  25 Hydroxy (Vit-D Deficiency, Fractures)  8. GAD (generalized anxiety disorder) - CMP14+EGFR  9. Vitamin B12 deficiency - Anemia Profile B - CMP14+EGFR  10. Type 2 diabetes mellitus with mild nonproliferative retinopathy without macular edema, with long-term current use of insulin , unspecified laterality (HCC) - CMP14+EGFR  11. Allergy to alpha-gal - CMP14+EGFR  12. Other fatigue - Anemia Profile B - CMP14+EGFR - VITAMIN D  25 Hydroxy (Vit-D Deficiency, Fractures) - TSH  13. Tick bite of abdominal wall, sequela -Pt to report any new fever, joint pain, or rash -Wear protective clothing while outside- Long sleeves and long pants -Put insect repellent on all exposed skin and along clothing -Take a shower as soon as possible after being outside - CMP14+EGFR - Lyme Disease Serology w/Reflex  Labs pending Keep follow up with specialists  Continue current medications  Health Maintenance reviewed Diet and exercise encouraged  Follow up plan: 4 months   Bari Learn, FNP

## 2023-10-27 NOTE — Patient Instructions (Signed)
 Fatigue If you have fatigue, you feel tired all the time and have a lack of energy or a lack of motivation. Fatigue may make it difficult to start or complete tasks because of exhaustion. Occasional or mild fatigue is often a normal response to activity or life. However, long-term (chronic) or extreme fatigue may be a symptom of a medical condition such as: Depression. Not having enough red blood cells or hemoglobin in the blood (anemia). A problem with a small gland located in the lower front part of the neck (thyroid disorder). Rheumatologic conditions. These are problems related to the body's defense system (immune system). Infections, especially certain viral infections. Fatigue can also lead to negative health outcomes over time. Follow these instructions at home: Medicines Take over-the-counter and prescription medicines only as told by your health care provider. Take a multivitamin if told by your health care provider. Do not use herbal or dietary supplements unless they are approved by your health care provider. Eating and drinking  Avoid heavy meals in the evening. Eat a well-balanced diet, which includes lean proteins, whole grains, plenty of fruits and vegetables, and low-fat dairy products. Avoid eating or drinking too many products with caffeine in them. Avoid alcohol. Drink enough fluid to keep your urine pale yellow. Activity  Exercise regularly, as told by your health care provider. Use or practice techniques to help you relax, such as yoga, tai chi, meditation, or massage therapy. Lifestyle Change situations that cause you stress. Try to keep your work and personal schedules in balance. Do not use recreational or illegal drugs. General instructions Monitor your fatigue for any changes. Go to bed and get up at the same time every day. Avoid fatigue by pacing yourself during the day and getting enough sleep at night. Maintain a healthy weight. Contact a health care  provider if: Your fatigue does not get better. You have a fever. You suddenly lose or gain weight. You have headaches. You have trouble falling asleep or sleeping through the night. You feel angry, guilty, anxious, or sad. You have swelling in your legs or another part of your body. Get help right away if: You feel confused, feel like you might faint, or faint. Your vision is blurry or you have a severe headache. You have severe pain in your abdomen, your back, or the area between your waist and hips (pelvis). You have chest pain, shortness of breath, or an irregular or fast heartbeat. You are unable to urinate, or you urinate less than normal. You have abnormal bleeding from the rectum, nose, lungs, nipples, or, if you are female, the vagina. You vomit blood. You have thoughts about hurting yourself or others. These symptoms may be an emergency. Get help right away. Call 911. Do not wait to see if the symptoms will go away. Do not drive yourself to the hospital. Get help right away if you feel like you may hurt yourself or others, or have thoughts about taking your own life. Go to your nearest emergency room or: Call 911. Call the National Suicide Prevention Lifeline at (262)721-8699 or 988. This is open 24 hours a day. Text the Crisis Text Line at 8450584327. Summary If you have fatigue, you feel tired all the time and have a lack of energy or a lack of motivation. Fatigue may make it difficult to start or complete tasks because of exhaustion. Long-term (chronic) or extreme fatigue may be a symptom of a medical condition. Exercise regularly, as told by your health care provider.  Change situations that cause you stress. Try to keep your work and personal schedules in balance. This information is not intended to replace advice given to you by your health care provider. Make sure you discuss any questions you have with your health care provider. Document Revised: 11/23/2020 Document  Reviewed: 11/23/2020 Elsevier Patient Education  2024 [employer].

## 2023-10-28 LAB — ANEMIA PROFILE B
Basophils Absolute: 0.1 x10E3/uL (ref 0.0–0.2)
Basos: 1 %
EOS (ABSOLUTE): 0.2 x10E3/uL (ref 0.0–0.4)
Eos: 2 %
Ferritin: 236 ng/mL — AB (ref 15–150)
Folate: 8.9 ng/mL (ref >3.0)
Hematocrit: 38.5 % (ref 34.0–46.6)
Hemoglobin: 12.9 g/dL (ref 11.1–15.9)
Immature Grans (Abs): 0 x10E3/uL (ref 0.0–0.1)
Immature Granulocytes: 0 %
Iron Saturation: 30 (ref 15–55)
Iron: 90 ug/dL (ref 27–139)
Lymphocytes Absolute: 2.7 x10E3/uL (ref 0.7–3.1)
Lymphs: 30 %
MCH: 31.4 pg (ref 26.6–33.0)
MCHC: 33.5 g/dL (ref 31.5–35.7)
MCV: 94 fL (ref 79–97)
Monocytes Absolute: 0.5 x10E3/uL (ref 0.1–0.9)
Monocytes: 5 %
Neutrophils Absolute: 5.6 x10E3/uL (ref 1.4–7.0)
Neutrophils: 62 %
Platelets: 200 x10E3/uL (ref 150–450)
RBC: 4.11 x10E6/uL (ref 3.77–5.28)
RDW: 13.3 % (ref 11.7–15.4)
Retic Ct Pct: 1.6 % (ref 0.6–2.6)
Total Iron Binding Capacity: 296 ug/dL (ref 250–450)
UIBC: 206 ug/dL (ref 118–369)
Vitamin B-12: 632 pg/mL (ref 232–1245)
WBC: 9.1 x10E3/uL (ref 3.4–10.8)

## 2023-10-28 LAB — CMP14+EGFR
ALT: 23 IU/L (ref 0–32)
AST: 20 IU/L (ref 0–40)
Albumin: 4.5 g/dL (ref 3.9–4.9)
Alkaline Phosphatase: 75 IU/L (ref 44–121)
BUN/Creatinine Ratio: 13 (ref 12–28)
BUN: 14 mg/dL (ref 8–27)
Bilirubin Total: 0.3 mg/dL (ref 0.0–1.2)
CO2: 15 mmol/L — AB (ref 20–29)
Calcium: 9.7 mg/dL (ref 8.7–10.3)
Chloride: 104 mmol/L (ref 96–106)
Creatinine, Ser: 1.09 mg/dL — AB (ref 0.57–1.00)
Globulin, Total: 2.6 g/dL (ref 1.5–4.5)
Glucose: 146 mg/dL — AB (ref 70–99)
Potassium: 4.9 mmol/L (ref 3.5–5.2)
Sodium: 138 mmol/L (ref 134–144)
Total Protein: 7.1 g/dL (ref 6.0–8.5)
eGFR: 56 mL/min/1.73 — AB (ref >59)

## 2023-10-28 LAB — LIPID PANEL
Cholesterol, Total: 186 mg/dL (ref 100–199)
HDL: 31 mg/dL — AB (ref >39)
LDL CALC COMMENT:: 6 ratio — AB (ref 0.0–4.4)
LDL Chol Calc (NIH): 102 mg/dL — AB (ref 0–99)
Triglycerides: 314 mg/dL — AB (ref 0–149)
VLDL Cholesterol Cal: 53 mg/dL — AB (ref 5–40)

## 2023-10-28 LAB — VITAMIN D 25 HYDROXY (VIT D DEFICIENCY, FRACTURES): Vit D, 25-Hydroxy: 28.9 ng/mL — AB (ref 30.0–100.0)

## 2023-10-28 LAB — TSH: TSH: 0.912 u[IU]/mL (ref 0.450–4.500)

## 2023-10-30 ENCOUNTER — Ambulatory Visit: Payer: Self-pay | Admitting: Family

## 2023-10-30 ENCOUNTER — Other Ambulatory Visit: Payer: Self-pay | Admitting: Family

## 2023-10-30 DIAGNOSIS — R5383 Other fatigue: Secondary | ICD-10-CM

## 2023-10-30 DIAGNOSIS — R0602 Shortness of breath: Secondary | ICD-10-CM

## 2023-10-30 LAB — LYME DISEASE SEROLOGY W/REFLEX: Lyme Total Antibody EIA: NEGATIVE

## 2023-10-30 MED ORDER — VITAMIN D (ERGOCALCIFEROL) 1.25 MG (50000 UNIT) PO CAPS: 50000.0000 [IU] | ORAL_CAPSULE | ORAL | 3 refills | Status: AC

## 2023-10-30 MED ORDER — ROSUVASTATIN CALCIUM 5 MG PO TABS: 5.0000 mg | ORAL_TABLET | Freq: Every day | ORAL | 3 refills | Status: AC

## 2023-11-02 DIAGNOSIS — Z961 Presence of intraocular lens: Secondary | ICD-10-CM | POA: Diagnosis not present

## 2023-11-02 DIAGNOSIS — E113293 Type 2 diabetes mellitus with mild nonproliferative diabetic retinopathy without macular edema, bilateral: Secondary | ICD-10-CM | POA: Diagnosis not present

## 2023-11-02 LAB — HM DIABETES EYE EXAM

## 2023-11-07 NOTE — Progress Notes (Signed)
 Stephanie Carroll                                          MRN: [id number]   11/07/2023   The [facility] Quality Team Specialist reviewed this patient medical record for the purposes of chart review for care gap closure. The following were reviewed: abstraction for care gap closure-diabetic eye exam.    [facility] Quality Team

## 2023-11-15 ENCOUNTER — Other Ambulatory Visit: Payer: Self-pay

## 2023-11-15 ENCOUNTER — Encounter: Payer: Self-pay | Admitting: Allergy & Immunology

## 2023-11-15 ENCOUNTER — Ambulatory Visit (INDEPENDENT_AMBULATORY_CARE_PROVIDER_SITE_OTHER): Admitting: Allergy & Immunology

## 2023-11-15 VITALS — BP 120/74 | HR 110 | Temp 97.9°F | Resp 18 | Ht 59.84 in | Wt 134.6 lb

## 2023-11-15 DIAGNOSIS — T7819XA Other adverse food reactions, not elsewhere classified, initial encounter: Secondary | ICD-10-CM | POA: Diagnosis not present

## 2023-11-15 DIAGNOSIS — T7819XD Other adverse food reactions, not elsewhere classified, subsequent encounter: Secondary | ICD-10-CM | POA: Diagnosis not present

## 2023-11-15 DIAGNOSIS — L299 Pruritus, unspecified: Secondary | ICD-10-CM

## 2023-11-15 DIAGNOSIS — J4530 Mild persistent asthma, uncomplicated: Secondary | ICD-10-CM | POA: Diagnosis not present

## 2023-11-15 NOTE — Progress Notes (Signed)
 NEW PATIENT  Date of Service/Encounter:  11/15/23  Consult requested by: Stephanie Bari LABOR, FNP   Assessment:   Allergic reaction to alpha-gal  Itching   Adverse reaction to iron infusions  Plan/Recommendations:   1. Allergic reaction to alpha-gal - We can recheck your levels to see where they are. - I doubt that the accupuncture did anything (there is no biologic mechanism why this would work). - The muscle test is not validated at all, so I am not convinced how accurate this is.  - We will get some blood work to look at the most common foods.  - These will rule out more than 95% of all food allergies.  - You seem to eat a lot of the food allergens anyway, but we will see what this shows. - EpiPen  training reviewed.  - Consider Xolair to help prevent cross contamination episodes.  - Information on Xolair provided today.  2. Itching - We will get some labs to look for weird causes of itching.  - We will call you in 1-2 weeks with the results of the testing.   3. Seasonal allergies - Continue with the cetirizine  10mg  daily to help with your symptoms.  - We can do allergy testing for environmental allergy testing at some point in the future if needed.   4. Mild persistent asthma, uncomplicated - Lung testing looks great today.  - I agree with continuing with the Asmanex  twice daily as you are doing.  - Daily controller medication(s): Asmanex  200mcg 1 puff twice daily - Prior to physical activity: Xopenex 2 puffs 10-15 minutes before physical activity. - Rescue medications: Xopenex 4 puffs every 4-6 hours as needed - Asthma control goals:  * Full participation in all desired activities (may need albuterol  before activity) * Albuterol  use two time or less a week on average (not counting use with activity) * Cough interfering with sleep two time or less a month * Oral steroids no more than once a year * No hospitalizations  5 Return in about 3 months (around  02/15/2024). You can have the follow up appointment with Dr. Iva or a Nurse Practicioner (our Nurse Practitioners are excellent and always have Physician oversight!).    This note in its entirety was forwarded to the Provider who requested this consultation.  Subjective:   Stephanie Carroll is a 68 y.o. female presenting today for evaluation of  Chief Complaint  Patient presents with   alpha gal    Was diagnosed in July 12 referring DR felt she need to come to an allergy clinic   Asthma    Doing well with medication    Stephanie Carroll has a history of the following: Patient Active Problem List   Diagnosis Date Noted   Allergy to alpha-gal 08/31/2023   Long term current use of oral hypoglycemic drug 10/10/2022   Left thyroid  nodule 10/29/2021   Type 2 diabetes mellitus with mild nonproliferative retinopathy without macular edema, with long-term current use of insulin  (HCC) 10/29/2021   Diabetic retinopathy (HCC) 02/24/2021   Vitamin B12 deficiency 02/24/2021   GAD (generalized anxiety disorder) 02/24/2021   Hypertension associated with type 2 diabetes mellitus (HCC) 10/23/2020   Iron deficiency anemia due to chronic blood loss 09/17/2020   Chronic asthma, mild persistent, uncomplicated 08/25/2020   Carpal tunnel syndrome of right wrist 11/06/2017   Cervical radiculopathy 10/20/2017   DDD (degenerative disc disease), cervical 10/20/2017   Environmental allergies 03/23/2015   Hand arthritis 03/23/2015  Multiple joint pain 03/23/2015   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  Discussed the use of AI scribe software for clinical note transcription with the patient and/or guardian, who gave verbal consent to proceed.  Stephanie Carroll was referred by Stephanie Bari LABOR, FNP.     [name] is a 68 y.o. female presenting for an evaluation of itching and alpha gal syndrome.  She was diagnosed with alpha-gal syndrome in July after experiencing symptoms following iron infusions. Initially, she sought treatment at a cancer center for iron deficiency and received iron infusions on June 11th. Six hours post-infusion, she developed dyspnea, which persisted for three days, leading to the cessation of iron treatments. A week later, she experienced severe knee pain, rendering her unable to walk for three weeks. Testing for various diseases, including Lyme disease, led to the diagnosis of alpha-gal syndrome.   Asthma/Respiratory Symptom History: She has asthma, managed with Asmanex  and Xopenex as needed, and has had COVID-19 four times, treated with Paxlovid  and an infusion during her first cancer diagnosis. Inhaled steroids do not affect her diabetes, but oral steroids like prednisone  do.   Allergic Rhinitis Symptom History: She does have environmental allergies. She has never been tested in the past.   Food Allergy Symptom History: She has a history of being allergic to peanuts, but was told 20 years ago that she was no longer allergic. She consumes sourdough bread and tilapia but avoids shrimp and is cautious with bread due to an app that advises on dietary restrictions. She takes Zyrtec  and Usenex for seasonal allergies, which include sneezing and itchy eyes, and uses a liquid form of Zyrtec  due to concerns about lactose in tablets.  Component     Latest Ref Rng 08/25/2023  IgE (Immunoglobulin E), Serum     6 - 495 IU/mL 389    Pork IgE     Class IV kU/L 5.64 !   Beef IgE     Class IV kU/L 12.30 !   Allergen Lamb IgE     Class IV kU/L 5.01 !   O215-IgE Alpha-Gal     Class V kU/L 30.30 !       Skin Symptom History: She is cautious about using lotions and other products due to fear of allergic reactions, influenced by a support group she joined. She has experienced joint pain, which has since resolved.   Her primary symptom is knee pain, with occasional itching but no rashes. She experienced some stomach discomfort initially but not significantly. She underwent acupuncture for three weeks, which seemed to alleviate some symptoms. She has avoided red meat and dairy, which she believes has helped reduce her symptoms. Her alpha-gal levels were reported by the patient to be 330 or lower.   She has osteoarthritis and possible rheumatoid arthritis, with a family history of rheumatoid arthritis in her mother.  Otherwise, there is no history of other atopic diseases, including drug allergies, stinging insect allergies, or contact dermatitis. There  is no significant infectious history. Vaccinations are up to date.    Past Medical History: Patient Active Problem List   Diagnosis Date Noted   Allergy to alpha-gal 08/31/2023   Long term current use of oral hypoglycemic drug 10/10/2022   Left thyroid  nodule 10/29/2021   Type 2 diabetes mellitus with mild nonproliferative retinopathy without macular edema, with long-term current use of insulin  (HCC) 10/29/2021   Diabetic retinopathy (HCC) 02/24/2021   Vitamin B12 deficiency 02/24/2021   GAD (generalized anxiety disorder) 02/24/2021   Hypertension associated with type 2 diabetes mellitus (HCC) 10/23/2020   Iron deficiency anemia due to chronic blood loss 09/17/2020   Chronic asthma, mild persistent, uncomplicated 08/25/2020   Carpal tunnel syndrome of right wrist 11/06/2017   Cervical radiculopathy 10/20/2017   DDD (degenerative disc disease), cervical 10/20/2017    Environmental allergies 03/23/2015   Hand arthritis 03/23/2015   Multiple joint pain 03/23/2015   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   Simple goiter 03/23/2015   Internal hemorrhoids 06/30/2014   Non-alcoholic fatty liver disease 11/27/2013   Rectal bleeding 09/20/2013   Personal history of colon cancer, stage I 09/20/2013   Vitamin D  deficiency 12/19/2008   History of malignant neoplasm of large intestine 08/04/2008   Hyperlipidemia associated with type 2 diabetes mellitus (HCC) 06/19/2008   Irritable bowel syndrome 03/19/2008   Diaphragmatic hernia 12/18/2007   Gastro-esophageal reflux disease without esophagitis 06/06/2007   Migraine headache 02/12/2007    Medication List:  Allergies as of 11/15/2023       Reactions   Fluticasone-salmeterol Palpitations   Morphine Palpitations, Other (See Comments)   REACTION: heart racing   Peanut Oil Other (See Comments), Cough   congested   Peanut-containing Drug Products Cough   Sulfa Antibiotics Other (See Comments)   REACTION: takes away appetite, makes her feel nervous, jittery   Other Other (See Comments)   Oats and Green peppers---headaches   Penicillins Rash   Sulfamethoxazole Nausea Only   Sulfur Other (See Comments)   out of her body   Montelukast    Pioglitazone     Joint pain   Ciprofloxacin Anxiety   Clindamycin Rash   Cyclobenzaprine Other (See Comments)   Loracarbef Rash      Metronidazole Other (See Comments)   Moxifloxacin Hcl In Nacl Anxiety   Penicillin G Sodium Rash        Medication List        Accurate as of November 15, 2023  4:30 PM. If you have any questions, ask your nurse or doctor.          STOP taking these medications    cholecalciferol 25 MCG (1000 UNIT) tablet Commonly known as: VITAMIN D3 Stopped by: Nicolai Labonte Louis Neo Yepiz   diclofenac  Sodium 1 % Gel Commonly known as: VOLTAREN  Stopped by: Marty Morton Shaggy       TAKE these medications    Aerochamber Plus Device Use as  directed   albuterol  108 (90 Base) MCG/ACT inhaler Commonly known as: VENTOLIN  HFA Inhale 1-2 puffs into the lungs every 4 (four) hours as needed. For shortness for breath   Asmanex  HFA 200 MCG/ACT Aero Generic drug: Mometasone Furoate  Inhale 1 puff into the lungs in the morning and at bedtime.   BLACK ELDERBERRY PO Take 1 tablet by mouth daily.   cetirizine  10 MG tablet Commonly known as: ZyrTEC  Allergy Take 1 tablet (10 mg total) by mouth daily.   Dexcom G7 Receiver Devi 1 Device by Does not apply route continuous.   Dexcom  G7 Sensor Misc 1 Device by Does not apply route as directed.   EPINEPHrine  0.3 mg/0.3 mL Soaj injection Commonly known as: EPI-PEN Inject 0.3 mg into the muscle as needed for anaphylaxis.   ferrous gluconate  324 MG tablet Commonly known as: FERGON Take 1 tablet (324 mg total) by mouth daily with breakfast.   Insulin  Pen Needle 32G X 4 MM Misc 1 Device by Does not apply route daily in the afternoon.   Lantus  SoloStar 100 UNIT/ML Solostar Pen Generic drug: insulin  glargine Inject 20 Units into the skin daily.   levalbuterol 1.25 MG/3ML nebulizer solution Commonly known as: XOPENEX Take 1.25 mg by nebulization as needed for wheezing.   metFORMIN  500 MG tablet Commonly known as: GLUCOPHAGE  Take 2 tablets (1,000 mg total) by mouth 2 (two) times daily with a meal.   NON FORMULARY Plexus Probio 5 dietary supplement   NON FORMULARY Plexus Slim Hunger Control weight management series   ondansetron  4 MG/5ML solution Commonly known as: ZOFRAN  Take 5 mLs (4 mg total) by mouth daily as needed for nausea or vomiting.   OneTouch Verio Reflect w/Device Kit USE TO TEST BLOOD SUGAR ONCE DAILY IN THE EVENING   OneTouch Verio test strip Generic drug: glucose blood USE TO CHECK BLOOD SUGAR 2 (TWO) TIMES DAILY. USE AS INSTRUCTED   RABEprazole  20 MG tablet Commonly known as: ACIPHEX  Take 1 tablet (20 mg total) by mouth 2 (two) times daily.    rosuvastatin  5 MG tablet Commonly known as: Crestor  Take 1 tablet (5 mg total) by mouth daily.   Vitamin D  (Ergocalciferol ) 1.25 MG (50000 UNIT) Caps capsule Commonly known as: DRISDOL  Take 1 capsule (50,000 Units total) by mouth every 7 (seven) days.        Birth History: non-contributory  Developmental History: non-contributory  Past Surgical History: Past Surgical History:  Procedure Laterality Date   BIOPSY THYROID      X2   CESAREAN SECTION     x 1   COLON SURGERY     FOOT FRACTURE SURGERY Left 02/2015   INCISIONAL HERNIA REPAIR     NASAL SEPTUM SURGERY     PARTIAL HYSTERECTOMY     POLYPECTOMY     UPPER GASTROINTESTINAL ENDOSCOPY     WRIST GANGLION EXCISION     left     Family History: Family History  Problem Relation Age of Onset   COPD Other        both sides   Diabetes Other        both sides   Heart disease Father    Kidney failure Father    Lung cancer Paternal Uncle    Leukemia Paternal Grandfather    Cervical cancer Paternal Aunt        x 2    Heart defect Mother        MVP   Breast cancer Mother    Colon cancer Neg Hx    Esophageal cancer Neg Hx    Rectal cancer Neg Hx    Stomach cancer Neg Hx      Social History: Latoia lives at home with her family.  She lives in a house that is 66 years old.  There is 1 throughout the home.  They have a heat pump for heating and cooling.  There are no dust mite covers on the bedding.  There is no tobacco exposure.  There is 1 dog inside of the home.  There are no fume, chemical, or dust exposures.  There is a  HEPA filter in the home.  There is no tobacco exposure.   Review of systems otherwise negative other than that mentioned in the HPI.    Objective:   Blood pressure 120/74, pulse (!) 110, temperature 97.9 F (36.6 C), temperature source Temporal, resp. rate 18, height 4' 11.84 (1.52 m), weight 134 lb 9.6 oz (61.1 kg), SpO2 97%. Body mass index is 26.43 kg/m.     Physical Exam Vitals  reviewed.  Constitutional:      Appearance: She is well-developed.  HENT:     Head: Normocephalic and atraumatic.     Right Ear: Tympanic membrane, ear canal and external ear normal. No drainage, swelling or tenderness. Tympanic membrane is not injected, scarred, erythematous, retracted or bulging.     Left Ear: Tympanic membrane, ear canal and external ear normal. No drainage, swelling or tenderness. Tympanic membrane is not injected, scarred, erythematous, retracted or bulging.     Nose: No nasal deformity, septal deviation, mucosal edema or rhinorrhea.     Right Turbinates: Enlarged, swollen and pale.     Left Turbinates: Enlarged, swollen and pale.     Right Sinus: No maxillary sinus tenderness or frontal sinus tenderness.     Left Sinus: No maxillary sinus tenderness or frontal sinus tenderness.     Mouth/Throat:     Lips: Pink.     Mouth: Mucous membranes are moist. Mucous membranes are not pale and not dry.     Pharynx: Uvula midline.  Eyes:     General:        Right eye: No discharge.        Left eye: No discharge.     Conjunctiva/sclera: Conjunctivae normal.     Right eye: Right conjunctiva is not injected. No chemosis.    Left eye: Left conjunctiva is not injected. No chemosis.    Pupils: Pupils are equal, round, and reactive to light.  Cardiovascular:     Rate and Rhythm: Normal rate and regular rhythm.     Heart sounds: Normal heart sounds.  Pulmonary:     Effort: Pulmonary effort is normal. No tachypnea, accessory muscle usage or respiratory distress.     Breath sounds: Normal breath sounds. No wheezing, rhonchi or rales.     Comments: Moving air well in all lung fields. No increased work of breathing.  Chest:     Chest wall: No tenderness.  Abdominal:     Tenderness: There is no abdominal tenderness. There is no guarding or rebound.  Lymphadenopathy:     Head:     Right side of head: No submandibular, tonsillar or occipital adenopathy.     Left side of head: No  submandibular, tonsillar or occipital adenopathy.     Cervical: No cervical adenopathy.  Skin:    Coloration: Skin is not pale.     Findings: No abrasion, erythema, petechiae or rash. Rash is not papular, urticarial or vesicular.  Neurological:     Mental Status: She is alert.  Psychiatric:        Behavior: Behavior is cooperative.      Diagnostic studies:    Spirometry: results normal (FEV1: 1.93/96%, FVC: 2.18/85%, FEV1/FVC: 89%).    Spirometry consistent with normal pattern.   Allergy Studies: none          Marty Shaggy, MD [facility] 

## 2023-11-15 NOTE — Patient Instructions (Addendum)
 1. Allergic reaction to alpha-gal - We can recheck your levels to see where they are. - I doubt that the accupuncture did anything (there is no biologic mechanism why this would work). - The muscle test is not validated at all, so I am not convinced how accurate this is.  - We will get some blood work to look at the most common foods.  - These will rule out more than 95% of all food allergies.  - You seem to eat a lot of the food allergens anyway, but we will see what this shows. - EpiPen  training reviewed.  - Consider Xolair to help prevent cross contamination episodes.  - Information on Xolair provided today.  2. Itching - We will get some labs to look for weird causes of itching.  - We will call you in 1-2 weeks with the results of the testing.   3. Seasonal allergies - Continue with the cetirizine  10mg  daily to help with your symptoms.  - We can do allergy testing for environmental allergy testing at some point in the future if needed.   4. Mild persistent asthma, uncomplicated - Lung testing looks great today.  - I agree with continuing with the Asmanex  twice daily as you are doing.  - Daily controller medication(s): Asmanex  200mcg 1 puff twice daily - Prior to physical activity: Xopenex 2 puffs 10-15 minutes before physical activity. - Rescue medications: Xopenex 4 puffs every 4-6 hours as needed - Asthma control goals:  * Full participation in all desired activities (may need albuterol  before activity) * Albuterol  use two time or less a week on average (not counting use with activity) * Cough interfering with sleep two time or less a month * Oral steroids no more than once a year * No hospitalizations  5 Return in about 3 months (around 02/15/2024). You can have the follow up appointment with Dr. Iva or a Nurse Practicioner (our Nurse Practitioners are excellent and always have Physician oversight!).    Please inform us  of any Emergency Department visits,  hospitalizations, or changes in symptoms. Call us  before going to the ED for breathing or allergy symptoms since we might be able to fit you in for a sick visit. Feel free to contact us  anytime with any questions, problems, or concerns.  It was a pleasure to meet you today!  Websites that have reliable patient information: 1. American Academy of Asthma, Allergy, and Immunology: www.aaaai.org 2. Food Allergy Research and Education (FARE): foodallergy.org 3. Mothers of Asthmatics: http://www.asthmacommunitynetwork.org 4. American College of Allergy, Asthma, and Immunology: www.acaai.org      "Like" us  on Facebook and Instagram for our latest updates!      A healthy democracy works best when [employer] participate! Make sure you are registered to vote! If you have moved or changed any of your contact information, you will need to get this updated before voting! Scan the QR codes below to learn more!

## 2023-11-15 NOTE — Addendum Note (Signed)
 Addended by: JENEL MARYLYNN GRADE on: 11/15/2023 05:17 PM   Modules accepted: Orders

## 2023-11-17 LAB — ALLERGY PANEL 18, NUT MIX GROUP
Allergen Coconut IgE: <0.1 kU/L
F020-IgE Almond: 0.1 kU/L — AB
F202-IgE Cashew Nut: <0.1 kU/L
Hazelnut (Filbert) IgE: 0.12 kU/L — AB
Peanut IgE: 0.1 kU/L — AB
Pecan Nut IgE: <0.1 kU/L
Sesame Seed IgE: 0.49 kU/L — AB

## 2023-11-17 LAB — ANTINUCLEAR ANTIBODIES, IFA: ANA Titer 1: POSITIVE — AB

## 2023-11-17 LAB — F245-IGE EGG, WHOLE: Egg, Whole IgE: <0.1 kU/L

## 2023-11-17 LAB — ALLERGY PANEL 19, SEAFOOD GROUP
Allergen Salmon IgE: <0.1 kU/L
Catfish: <0.1 kU/L
Codfish IgE: <0.1 kU/L
F023-IgE Crab: <0.1 kU/L
F080-IgE Lobster: <0.1 kU/L
Shrimp IgE: <0.1 kU/L
Tuna: <0.1 kU/L

## 2023-11-17 LAB — TRYPTASE: Tryptase: 8.7 ug/L (ref 2.2–13.2)

## 2023-11-17 LAB — ALPHA-GAL PANEL
Allergen Lamb IgE: 3.13 kU/L — AB
Beef IgE: 7.03 kU/L — AB
IgE (Immunoglobulin E), Serum: 229 [IU]/mL (ref 6–495)
O215-IgE Alpha-Gal: 13 kU/L — AB
Pork IgE: 3.15 kU/L — AB

## 2023-11-17 LAB — FANA STAINING PATTERNS: Speckled Pattern: 1:80 {titer}

## 2023-11-17 LAB — C-REACTIVE PROTEIN: CRP: 3 mg/L (ref 0–10)

## 2023-11-17 LAB — ALLERGEN SOYBEAN: Soybean IgE: <0.1 kU/L

## 2023-11-17 LAB — RHEUMATOID FACTOR: Rheumatoid fact SerPl-aCnc: <10 [IU]/mL (ref <14.0)

## 2023-11-17 LAB — ALLERGEN, WHEAT, F4: Wheat IgE: 0.11 kU/L — AB

## 2023-11-17 LAB — SEDIMENTATION RATE: Sed Rate: 11 mm/h (ref 0–40)

## 2023-11-17 LAB — ALLERGEN MILK: Milk IgE: 0.21 kU/L — AB

## 2023-11-19 ENCOUNTER — Ambulatory Visit: Payer: Self-pay | Admitting: Allergy & Immunology

## 2023-11-23 ENCOUNTER — Ambulatory Visit: Payer: Medicare Other

## 2023-11-23 VITALS — BP 120/74 | HR 110 | Ht 59.0 in | Wt 134.0 lb

## 2023-11-23 DIAGNOSIS — Z0000 Encounter for general adult medical examination without abnormal findings: Secondary | ICD-10-CM | POA: Diagnosis not present

## 2023-11-23 NOTE — Progress Notes (Signed)
 Subjective:   Stephanie Carroll is a 68 y.o. who presents for a Medicare Wellness preventive visit.  As a reminder, Annual Wellness Visits don't include a physical exam, and some assessments may be limited, especially if this visit is performed virtually. We may recommend an in-person follow-up visit with your provider if needed.  Visit Complete: Virtual I connected with  [name] on 11/23/23 by a audio enabled telemedicine application and verified that I am speaking with the correct person using two identifiers.  Patient Location: Home  Provider Location: Home Office  I discussed the limitations of evaluation and management by telemedicine. The patient expressed understanding and agreed to proceed.  Vital Signs: Because this visit was a virtual/telehealth visit, some criteria may be missing or patient reported. Any vitals not documented were not able to be obtained and vitals that have been documented are patient reported.  VideoDeclined- This patient declined Librarian, academic. Therefore the visit was completed with audio only.  Persons Participating in Visit: Patient.  AWV Questionnaire: No: Patient Medicare AWV questionnaire was not completed prior to this visit.  Cardiac Risk Factors include: advanced age (>42men, >31 women);diabetes mellitus;dyslipidemia     Objective:    Today's Vitals   11/23/23 1045  BP: 120/74  Pulse: (!) 110  Weight: 134 lb (60.8 kg)  Height: 4' 11 (1.499 m)   Body mass index is 27.06 kg/m.     11/23/2023   10:47 AM 09/27/2023   10:19 AM 07/27/2023    1:02 PM 07/14/2023    3:38 PM 06/17/2022   11:08 AM 11/26/2021   11:17 AM 03/23/2021   10:37 AM  Advanced Directives  Does Patient Have a Medical Advance Directive? No No No No No No No  Would patient like information on creating a medical advance directive?  No - Patient declined No - Patient declined No - Patient declined No - Patient declined No - Patient declined No  - Patient declined    Current Medications (verified) Outpatient Encounter Medications as of 11/23/2023  Medication Sig   albuterol  (VENTOLIN  HFA) 108 (90 Base) MCG/ACT inhaler Inhale 1-2 puffs into the lungs every 4 (four) hours as needed. For shortness for breath   BLACK ELDERBERRY PO Take 1 tablet by mouth daily.   Blood Glucose Monitoring Suppl (ONETOUCH VERIO REFLECT) w/Device KIT USE TO TEST BLOOD SUGAR ONCE DAILY IN THE EVENING   cetirizine  (ZYRTEC  ALLERGY) 10 MG tablet Take 1 tablet (10 mg total) by mouth daily.   Continuous Glucose Receiver (DEXCOM G7 RECEIVER) DEVI 1 Device by Does not apply route continuous.   Continuous Glucose Sensor (DEXCOM G7 SENSOR) MISC 1 Device by Does not apply route as directed.   EPINEPHrine  0.3 mg/0.3 mL IJ SOAJ injection Inject 0.3 mg into the muscle as needed for anaphylaxis.   insulin  glargine (LANTUS  SOLOSTAR) 100 UNIT/ML Solostar Pen Inject 20 Units into the skin daily.   Insulin  Pen Needle 32G X 4 MM MISC 1 Device by Does not apply route daily in the afternoon.   levalbuterol (XOPENEX) 1.25 MG/3ML nebulizer solution Take 1.25 mg by nebulization as needed for wheezing.   metFORMIN  (GLUCOPHAGE ) 500 MG tablet Take 2 tablets (1,000 mg total) by mouth 2 (two) times daily with a meal.   Mometasone Furoate  (ASMANEX  HFA) 200 MCG/ACT AERO Inhale 1 puff into the lungs in the morning and at bedtime.   ONETOUCH VERIO test strip USE TO CHECK BLOOD SUGAR 2 (TWO) TIMES DAILY. USE AS INSTRUCTED  RABEprazole  (ACIPHEX ) 20 MG tablet Take 1 tablet (20 mg total) by mouth 2 (two) times daily.   Spacer/Aero-Holding Chambers (AEROCHAMBER PLUS WITH MASK) inhaler Use as directed   ferrous gluconate  (FERGON) 324 MG tablet Take 1 tablet (324 mg total) by mouth daily with breakfast. (Patient not taking: Reported on 11/23/2023)   NON FORMULARY Plexus Probio 5 dietary supplement (Patient not taking: Reported on 11/23/2023)   NON FORMULARY Plexus Slim Hunger Control weight management  series (Patient not taking: Reported on 11/23/2023)   ondansetron  (ZOFRAN ) 4 MG/5ML solution Take 5 mLs (4 mg total) by mouth daily as needed for nausea or vomiting. (Patient not taking: Reported on 11/23/2023)   rosuvastatin  (CRESTOR ) 5 MG tablet Take 1 tablet (5 mg total) by mouth daily. (Patient not taking: Reported on 11/23/2023)   Vitamin D , Ergocalciferol , (DRISDOL ) 1.25 MG (50000 UNIT) CAPS capsule Take 1 capsule (50,000 Units total) by mouth every 7 (seven) days. (Patient not taking: Reported on 11/23/2023)   No facility-administered encounter medications on file as of 11/23/2023.    Allergies (verified) Fluticasone-salmeterol, Morphine, Peanut oil, Peanut-containing drug products, Sulfa antibiotics, Other, Penicillins, Sulfamethoxazole, Sulfur, Montelukast, Pioglitazone , Ciprofloxacin, Clindamycin, Cyclobenzaprine, Loracarbef, Metronidazole, Moxifloxacin hcl in nacl, and Penicillin g sodium   History: Past Medical History:  Diagnosis Date   Allergy    Arthritis    DDD   Asthma    Colon cancer (HCC) 2008   Diabetes mellitus    Elevated liver enzymes    GERD (gastroesophageal reflux disease)    History of thyroiditis 12/18/2007   Formatting of this note might be different from the original. Thyroiditis   Hyperlipidemia    Iron deficiency anemia due to chronic blood loss 09/17/2020   Thyroid  tumor, benign    Past Surgical History:  Procedure Laterality Date   BIOPSY THYROID      X2   CESAREAN SECTION     x 1   COLON SURGERY     FOOT FRACTURE SURGERY Left 02/2015   INCISIONAL HERNIA REPAIR     NASAL SEPTUM SURGERY     PARTIAL HYSTERECTOMY     POLYPECTOMY     UPPER GASTROINTESTINAL ENDOSCOPY     WRIST GANGLION EXCISION     left   Family History  Problem Relation Age of Onset   COPD Other        both sides   Diabetes Other        both sides   Heart disease Father    Kidney failure Father    Lung cancer Paternal Uncle    Leukemia Paternal Grandfather    Cervical cancer  Paternal Aunt        x 2    Heart defect Mother        MVP   Breast cancer Mother    Colon cancer Neg Hx    Esophageal cancer Neg Hx    Rectal cancer Neg Hx    Stomach cancer Neg Hx    Social History   Socioeconomic History   Marital status: Married    Spouse name: Not on file   Number of children: 2   Years of education: Not on file   Highest education level: Not on file  Occupational History   Occupation: claims for [employer]    Employer: [employer]  Tobacco Use   Smoking status: Never   Smokeless tobacco: Never   Tobacco comments:    never used product  Vaping Use   Vaping status: Never Used  Substance and  Sexual Activity   Alcohol use: No    Alcohol/week: 0.0 standard drinks of alcohol   Drug use: No   Sexual activity: Not on file  Other Topics Concern   Not on file  Social History Narrative   Not on file   Social Drivers of Health   Financial Resource Strain: Low Risk  (11/23/2023)   Overall Financial Resource Strain (CARDIA)    Difficulty of Paying Living Expenses: Not hard at all  Food Insecurity: No Food Insecurity (11/23/2023)   Hunger Vital Sign    Worried About Running Out of Food in the Last Year: Never true    Ran Out of Food in the Last Year: Never true  Transportation Needs: No Transportation Needs (11/23/2023)   PRAPARE - Administrator, Civil Service (Medical): No    Lack of Transportation (Non-Medical): No  Physical Activity: Inactive (11/23/2023)   Exercise Vital Sign    Days of Exercise per Week: 0 days    Minutes of Exercise per Session: 0 min  Stress: Stress Concern Present (11/23/2023)   [employer] of Occupational Health - Occupational Stress Questionnaire    Feeling of Stress: To some extent  Social Connections: Socially Integrated (11/23/2023)   Social Connection and Isolation Panel    Frequency of Communication with Friends and Family: More than three times a week    Frequency of Social Gatherings with Friends and Family:  More than three times a week    Attends Religious Services: 1 to 4 times per year    Active Member of [employer] or Organizations: Yes    Attends Engineer, structural: More than 4 times per year    Marital Status: Married    Tobacco Counseling Counseling given: Yes Tobacco comments: never used product    Clinical Intake:  Pre-visit preparation completed: Yes  Pain : No/denies pain     BMI - recorded: 27.06 Nutritional Status: BMI 25 -29 Overweight Nutritional Risks: None Diabetes: Yes  Lab Results  Component Value Date   HGBA1C 7.0 (H) 09/27/2023   HGBA1C 7.8 (A) 05/26/2023   HGBA1C 7.1 (A) 10/10/2022     How often do you need to have someone help you when you read instructions, pamphlets, or other written materials from your doctor or pharmacy?: 1 - Never  Interpreter Needed?: No  Information entered by :: alia t/cma   Activities of Daily Living     11/23/2023   10:44 AM  In your present state of health, do you have any difficulty performing the following activities:  Hearing? 0  Vision? 0  Difficulty concentrating or making decisions? 0  Walking or climbing stairs? 0  Dressing or bathing? 0  Doing errands, shopping? 0  Preparing Food and eating ? N  Using the Toilet? N  In the past six months, have you accidently leaked urine? N  Do you have problems with loss of bowel control? N  Managing your Medications? N  Managing your Finances? N  Housekeeping or managing your Housekeeping? N    Patient Care Team: Lavell Bari LABOR, FNP as PCP - General (Family Medicine) Octavia, Charlie Hamilton, MD as Consulting Physician (Ophthalmology) Taam-Akelman, Rosalea K, MD as Referring Physician (Obstetrics and Gynecology) Darlean Ozell NOVAK, MD as Consulting Physician (Pulmonary Disease)  I have updated your Care Teams any recent Medical Services you may have received from other providers in the past year.     Assessment:   This is a routine wellness examination for  [facility].  Hearing/Vision screen Hearing Screening - Comments:: Pt denies hearing dif Vision Screening - Comments:: Pt denies vision dif/pt goes Dr Octavia in [place] ov 2025   Goals Addressed   None    Depression Screen     11/23/2023   10:48 AM 10/27/2023    2:43 PM 09/27/2023   10:25 AM 09/04/2023    3:22 PM 01/04/2023   11:51 AM 11/24/2022    3:50 PM 11/22/2022   10:44 AM  PHQ 2/9 Scores  PHQ - 2 Score 0 0 0 0 0 0 0  PHQ- 9 Score 0 0   1  1    Fall Risk     10/27/2023    2:43 PM 09/04/2023    3:22 PM 01/04/2023   11:51 AM 06/20/2022   10:47 AM 04/29/2022   11:56 AM  Fall Risk   Falls in the past year? 0 0 1 0 0  Number falls in past yr: 0 0 0 0   Injury with Fall? 0 0 1 0   Risk for fall due to : No Fall Risks  History of fall(s) No Fall Risks   Follow up Falls evaluation completed  Falls evaluation completed Falls evaluation completed     MEDICARE RISK AT HOME:  Medicare Risk at Home Any stairs in or around the home?: Yes If so, are there any without handrails?: Yes Home free of loose throw rugs in walkways, pet beds, electrical cords, etc?: Yes Adequate lighting in your home to reduce risk of falls?: Yes Life alert?: No Use of a cane, walker or w/c?: No Grab bars in the bathroom?: No Shower chair or bench in shower?: No Elevated toilet seat or a handicapped toilet?: Yes  TIMED UP AND GO:  Was the test performed?  no  Cognitive Function: 6CIT completed    11/22/2022   10:47 AM  MMSE - Mini Mental State Exam  Orientation to time 5  Orientation to Place 5  Registration 3  Attention/ Calculation 5  Recall 3  Language- name 2 objects 2  Language- repeat 1  Language- follow 3 step command 3  Language- read & follow direction 1  Write a sentence 1  Copy design 1  Total score 30        11/23/2023   10:47 AM  6CIT Screen  What Year? 0 points  What month? 0 points  What time? 0 points  Count back from 20 0 points  Months in reverse 0 points   Repeat phrase 0 points  Total Score 0 points    Immunizations Immunization History  Administered Date(s) Administered   Tdap 02/13/2007, 07/05/2021    Screening Tests Health Maintenance  Topic Date Due   Pneumococcal Vaccine: 50+ Years (1 of 2 - PCV) Never done   Zoster Vaccines- Shingrix (1 of 2) 01/26/2024 (Originally 07/13/1974)   Influenza Vaccine  05/14/2024 (Originally 09/15/2023)   COVID-19 Vaccine (1) 11/11/2024 (Originally 07/14/1960)   HEMOGLOBIN A1C  03/29/2024   Diabetic kidney evaluation - Urine ACR  05/25/2024   FOOT EXAM  10/08/2024   Diabetic kidney evaluation - eGFR measurement  10/26/2024   OPHTHALMOLOGY EXAM  11/01/2024   Medicare Annual Wellness (AWV)  11/22/2024   Mammogram  01/26/2025   Colonoscopy  12/09/2025   DTaP/Tdap/Td (3 - Td or Tdap) 07/06/2031   DEXA SCAN  Completed   Hepatitis C Screening  Completed   Meningococcal B Vaccine  Aged Out    Health Maintenance Items Addressed: See Nurse Notes at  the end of this note  Additional Screening:  Vision Screening: Recommended annual ophthalmology exams for early detection of glaucoma and other disorders of the eye. Is the patient up to date with their annual eye exam?  Yes  Who is the provider or what is the name of the office in which the patient attends annual eye exams? Dr Octavia in [place]  Dental Screening: Recommended annual dental exams for proper oral hygiene  Community Resource Referral / Chronic Care Management: CRR required this visit?  No   CCM required this visit?  No   Plan:    I have personally reviewed and noted the following in the patient's chart:   Medical and social history Use of alcohol, tobacco or illicit drugs  Current medications and supplements including opioid prescriptions. Patient is not currently taking opioid prescriptions. Functional ability and status Nutritional status Physical activity Advanced directives List of other physicians Hospitalizations,  surgeries, and ER visits in previous 12 months Vitals Screenings to include cognitive, depression, and falls Referrals and appointments  In addition, I have reviewed and discussed with patient certain preventive protocols, quality metrics, and best practice recommendations. A written personalized care plan for preventive services as well as general preventive health recommendations were provided to patient.   Ozie Ned, CMA   11/23/2023   After Visit Summary: (MyChart) Due to this being a telephonic visit, the after visit summary with patients personalized plan was offered to patient via MyChart   Notes: PCP Follow Up Recommendations: Pt is aware and due pneumonia vaccine-pt declined due to algal allgery

## 2023-11-23 NOTE — Patient Instructions (Signed)
 Ms. Ousley,  Thank you for taking the time for your Medicare Wellness Visit. I appreciate your continued commitment to your health goals. Please review the care plan we discussed, and feel free to reach out if I can assist you further.  Medicare recommends these wellness visits once per year to help you and your care team stay ahead of potential health issues. These visits are designed to focus on prevention, allowing your provider to concentrate on managing your acute and chronic conditions during your regular appointments.  Please note that Annual Wellness Visits do not include a physical exam. Some assessments may be limited, especially if the visit was conducted virtually. If needed, we may recommend a separate in-person follow-up with your provider.  Ongoing Care Seeing your primary care provider every 3 to 6 months helps us  monitor your health and provide consistent, personalized care.   Referrals If a referral was made during today's visit and you haven't received any updates within two weeks, please contact the referred provider directly to check on the status.  Recommended Screenings:  Health Maintenance  Topic Date Due   Pneumococcal Vaccine for age over 81 (1 of 2 - PCV) Never done   Medicare Annual Wellness Visit  11/22/2023   Zoster (Shingles) Vaccine (1 of 2) 01/26/2024*   Flu Shot  05/14/2024*   COVID-19 Vaccine (1) 11/11/2024*   Hemoglobin A1C  03/29/2024   Yearly kidney health urinalysis for diabetes  05/25/2024   Complete foot exam   10/08/2024   Yearly kidney function blood test for diabetes  10/26/2024   Eye exam for diabetics  11/01/2024   Breast Cancer Screening  01/26/2025   Colon Cancer Screening  12/09/2025   DTaP/Tdap/Td vaccine (3 - Td or Tdap) 07/06/2031   DEXA scan (bone density measurement)  Completed   Hepatitis C Screening  Completed   Meningitis B Vaccine  Aged Out  *Topic was postponed. The date shown is not the original due date.       11/23/2023    10:47 AM  Advanced Directives  Does Patient Have a Medical Advance Directive? No   Advance Care Planning is important because it: Ensures you receive medical care that aligns with your values, goals, and preferences. Provides guidance to your family and loved ones, reducing the emotional burden of decision-making during critical moments.  Vision: Annual vision screenings are recommended for early detection of glaucoma, cataracts, and diabetic retinopathy. These exams can also reveal signs of chronic conditions such as diabetes and high blood pressure.  Dental: Annual dental screenings help detect early signs of oral cancer, gum disease, and other conditions linked to overall health, including heart disease and diabetes.  Please see the attached documents for additional preventive care recommendations.

## 2023-11-27 ENCOUNTER — Other Ambulatory Visit: Payer: Self-pay | Admitting: Internal Medicine

## 2023-12-16 ENCOUNTER — Other Ambulatory Visit: Payer: Self-pay | Admitting: Internal Medicine

## 2023-12-22 ENCOUNTER — Inpatient Hospital Stay: Attending: Hematology & Oncology

## 2023-12-22 DIAGNOSIS — Z79899 Other long term (current) drug therapy: Secondary | ICD-10-CM | POA: Diagnosis not present

## 2023-12-22 DIAGNOSIS — C182 Malignant neoplasm of ascending colon: Secondary | ICD-10-CM | POA: Insufficient documentation

## 2023-12-22 DIAGNOSIS — K625 Hemorrhage of anus and rectum: Secondary | ICD-10-CM

## 2023-12-22 DIAGNOSIS — K648 Other hemorrhoids: Secondary | ICD-10-CM

## 2023-12-22 DIAGNOSIS — D500 Iron deficiency anemia secondary to blood loss (chronic): Secondary | ICD-10-CM

## 2023-12-22 LAB — CBC WITH DIFFERENTIAL (CANCER CENTER ONLY)
Abs Immature Granulocytes: 0.03 K/uL (ref 0.00–0.07)
Basophils Absolute: 0.1 K/uL (ref 0.0–0.1)
Basophils Relative: 1 %
Eosinophils Absolute: 0.2 K/uL (ref 0.0–0.5)
Eosinophils Relative: 2 %
HCT: 36.3 % (ref 36.0–46.0)
Hemoglobin: 12.2 g/dL (ref 12.0–15.0)
Immature Granulocytes: 0 %
Lymphocytes Relative: 21 %
Lymphs Abs: 2 K/uL (ref 0.7–4.0)
MCH: 31.3 pg (ref 26.0–34.0)
MCHC: 33.6 g/dL (ref 30.0–36.0)
MCV: 93.1 fL (ref 80.0–100.0)
Monocytes Absolute: 0.6 K/uL (ref 0.1–1.0)
Monocytes Relative: 6 %
Neutro Abs: 6.7 K/uL (ref 1.7–7.7)
Neutrophils Relative %: 70 %
Platelet Count: 180 K/uL (ref 150–400)
RBC: 3.9 MIL/uL (ref 3.87–5.11)
RDW: 12.6 % (ref 11.5–15.5)
WBC Count: 9.6 K/uL (ref 4.0–10.5)
nRBC: 0 % (ref 0.0–0.2)

## 2023-12-22 LAB — CMP (CANCER CENTER ONLY)
ALT: 22 U/L (ref 0–44)
AST: 23 U/L (ref 15–41)
Albumin: 4.5 g/dL (ref 3.5–5.0)
Alkaline Phosphatase: 93 U/L (ref 38–126)
Anion gap: 13 (ref 5–15)
BUN: 10 mg/dL (ref 8–23)
CO2: 24 mmol/L (ref 22–32)
Calcium: 9.6 mg/dL (ref 8.9–10.3)
Chloride: 105 mmol/L (ref 98–111)
Creatinine: 0.98 mg/dL (ref 0.44–1.00)
GFR, Estimated: >60 mL/min (ref >60)
Glucose, Bld: 202 mg/dL — ABNORMAL HIGH (ref 70–99)
Potassium: 4.8 mmol/L (ref 3.5–5.1)
Sodium: 142 mmol/L (ref 135–145)
Total Bilirubin: 0.4 mg/dL (ref 0.0–1.2)
Total Protein: 7.6 g/dL (ref 6.5–8.1)

## 2023-12-22 LAB — CEA (ACCESS): CEA (CHCC): 2.93 ng/mL (ref 0.00–5.00)

## 2023-12-22 LAB — LACTATE DEHYDROGENASE: LDH: 141 U/L (ref 98–192)

## 2024-01-03 ENCOUNTER — Encounter: Attending: Family | Admitting: Nutrition

## 2024-01-03 VITALS — Ht 61.0 in | Wt 134.0 lb

## 2024-01-03 DIAGNOSIS — E119 Type 2 diabetes mellitus without complications: Secondary | ICD-10-CM | POA: Insufficient documentation

## 2024-01-03 DIAGNOSIS — T7819XA Other adverse food reactions, not elsewhere classified, initial encounter: Secondary | ICD-10-CM | POA: Insufficient documentation

## 2024-01-03 DIAGNOSIS — Z794 Long term (current) use of insulin: Secondary | ICD-10-CM | POA: Insufficient documentation

## 2024-01-03 NOTE — Progress Notes (Signed)
 Medical Nutrition Therapy  Appointment Start time:  1330   Appointment End time [phone]  Primary concerns today: Alpha gal and DM Referral diagnosis: T78.1, E11.8 Preferred learning style: See  Learning readiness: Ready  NUTRITION ASSESSMENT 68 yr old wfemale referred for new diagnosis of alpha gal a week ago. Has Type 2 DM. Using the FIG app and that has helped with identifying foods etc to avoid due to alpha gal. Lantus  35 units nightly.Metformin  500 mg BID. Last A1C 7% and goes to PCP in December 2025. Has Hyperlipidemia with elevated TG's and high LDL. High Risk for CVD. On Crestor . Has been eating more dried beans and vegetables/fruit. Diet needs more lower carb vegetables and high fiber foods. Drinking water Trying to avoid 4 legged hooved animal products. Has been reading food labels.  She is willing to work on more whole plant based foods to improve her Alpha Gal and DM Type 2. Wt has been stable.  Wt Readings from Last 3 Encounters:  01/03/24 134 lb (60.8 kg)  11/23/23 134 lb (60.8 kg)  11/15/23 134 lb 9.6 oz (61.1 kg)   Ht Readings from Last 3 Encounters:  01/03/24 5' 1 (1.549 m)  11/23/23 4' 11 (1.499 m)  11/15/23 4' 11.84 (1.52 m)   Body mass index is 25.32 kg/m. @BMIFA @ Facility age limit for growth %iles is 20 years. Facility age limit for growth %iles is 20 years.  Clinical Medical Hx:  Past Medical History:  Diagnosis Date   Allergy     Arthritis    DDD   Asthma    Colon cancer (HCC) 2008   Diabetes mellitus    Elevated liver enzymes    GERD (gastroesophageal reflux disease)    History of thyroiditis 12/18/2007   Formatting of this note might be different from the original. Thyroiditis   Hyperlipidemia    Iron deficiency anemia due to chronic blood loss 09/17/2020   Thyroid  tumor, benign     Medications:  Current Outpatient Medications on File Prior to Visit  Medication Sig Dispense Refill   albuterol  (VENTOLIN  HFA) 108 (90 Base) MCG/ACT inhaler  Inhale 1-2 puffs into the lungs every 4 (four) hours as needed. For shortness for breath 18 g 11   BLACK ELDERBERRY PO Take 1 tablet by mouth daily.     Blood Glucose Monitoring Suppl (ONETOUCH VERIO REFLECT) w/Device KIT USE TO TEST BLOOD SUGAR ONCE DAILY IN THE EVENING 1 kit 0   cetirizine  (ZYRTEC  ALLERGY ) 10 MG tablet Take 1 tablet (10 mg total) by mouth daily. 90 tablet 1   Continuous Glucose Receiver (DEXCOM G7 RECEIVER) DEVI USE AS DIRECTED 1 each 0   Continuous Glucose Sensor (DEXCOM G7 SENSOR) MISC USE AS DIRECTED 9 each 3   EPINEPHrine  0.3 mg/0.3 mL IJ SOAJ injection Inject 0.3 mg into the muscle as needed for anaphylaxis. 1 each 11   ferrous gluconate  (FERGON) 324 MG tablet Take 1 tablet (324 mg total) by mouth daily with breakfast. (Patient not taking: Reported on 11/23/2023) 60 tablet 3   insulin  glargine (LANTUS  SOLOSTAR) 100 UNIT/ML Solostar Pen Inject 20 Units into the skin daily. 30 mL 4   Insulin  Pen Needle 32G X 4 MM MISC 1 Device by Does not apply route daily in the afternoon. 100 each 3   levalbuterol (XOPENEX) 1.25 MG/3ML nebulizer solution Take 1.25 mg by nebulization as needed for wheezing.     metFORMIN  (GLUCOPHAGE ) 500 MG tablet Take 2 tablets (1,000 mg total) by mouth 2 (two) times daily  with a meal. 360 tablet 3   Mometasone Furoate  (ASMANEX  HFA) 200 MCG/ACT AERO Inhale 1 puff into the lungs in the morning and at bedtime. 1 each 11   NON FORMULARY Plexus Probio 5 dietary supplement (Patient not taking: Reported on 11/23/2023)     NON FORMULARY Plexus Slim Hunger Control weight management series (Patient not taking: Reported on 11/23/2023)     ondansetron  (ZOFRAN ) 4 MG/5ML solution Take 5 mLs (4 mg total) by mouth daily as needed for nausea or vomiting. (Patient not taking: Reported on 11/23/2023) 50 mL 2   ONETOUCH VERIO test strip USE TO CHECK BLOOD SUGAR 2 (TWO) TIMES DAILY. USE AS INSTRUCTED 200 strip 3   RABEprazole  (ACIPHEX ) 20 MG tablet Take 1 tablet (20 mg total) by  mouth 2 (two) times daily. 180 tablet 3   rosuvastatin  (CRESTOR ) 5 MG tablet Take 1 tablet (5 mg total) by mouth daily. (Patient not taking: Reported on 11/23/2023) 90 tablet 3   Spacer/Aero-Holding Chambers (AEROCHAMBER PLUS WITH MASK) inhaler Use as directed 1 each 11   Vitamin D , Ergocalciferol , (DRISDOL ) 1.25 MG (50000 UNIT) CAPS capsule Take 1 capsule (50,000 Units total) by mouth every 7 (seven) days. (Patient not taking: Reported on 11/23/2023) 12 capsule 3   No current facility-administered medications on file prior to visit.    Labs:     Latest Ref Rng & Units 12/22/2023   10:59 AM 10/27/2023    3:07 PM 09/27/2023    9:54 AM  CMP  Glucose 70 - 99 mg/dL 797  853  789   BUN 8 - 23 mg/dL 10  14  17    Creatinine 0.44 - 1.00 mg/dL 9.01  8.90  8.90   Sodium 135 - 145 mmol/L 142  138  136   Potassium 3.5 - 5.1 mmol/L 4.8  4.9  5.2   Chloride 98 - 111 mmol/L 105  104  102   CO2 22 - 32 mmol/L 24  15  21    Calcium  8.9 - 10.3 mg/dL 9.6  9.7  9.7   Total Protein 6.5 - 8.1 g/dL 7.6  7.1  7.7   Total Bilirubin 0.0 - 1.2 mg/dL 0.4  0.3  0.4   Alkaline Phos 38 - 126 U/L 93  75  85   AST 15 - 41 U/L 23  20  33   ALT 0 - 44 U/L 22  23  34    Lipid Panel     Component Value Date/Time   CHOL 186 10/27/2023 1507   TRIG 314 (H) 10/27/2023 1507   HDL 31 (L) 10/27/2023 1507   CHOLHDL 6.0 (H) 10/27/2023 1507   CHOLHDL 6 03/08/2019 1603   VLDL 59.8 (H) 03/08/2019 1603   LDLCALC 102 (H) 10/27/2023 1507   LDLDIRECT 142.0 03/08/2019 1603   LABVLDL 53 (H) 10/27/2023 1507   Lab Results  Component Value Date   HGBA1C 7.0 (H) 09/27/2023    Notable Signs/Symptoms: knees hurting  Lifestyle & Dietary Hx Lives with her husband.  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  As evidenced by positive for  Alpha Gal..   NUTRITION INTERVENTION  Nutrition education (E-1) on the following topics:  Alpha Gal and diet restrictins  Handouts Provided Include  Alpha Gal diet restrictions  Learning Style & Readiness for Change Teaching method utilized: Visual & Auditory  Demonstrated degree of understanding via: Teach Back  Barriers to learning/adherence to lifestyle change: None  Goals Established by Pt Goals Cut out snacks after supper. Increase vegetables and fruit with meals. Get A1C down to 6.5%   DInner idea Chicken, broccoli, whole sweet potato and a piece of fruit Make sure  you have a protein source-- beans, pea, lentils or chicken/fish/tukey at each meal Eat 1 piece of fruit at each meal Eat 1-2 low carb vegetables with lunch and dinner daily to meet your nutritional needs MONITORING & EVALUATION Dietary intake, weekly physical activity, and  3 month.  Next Steps  Patient is to follow diet restrictions for alpha gal and diabetic meal plan.

## 2024-01-03 NOTE — Patient Instructions (Signed)
 Cut out snacks after supper. Increase vegetables and fruit with meals. Get A1C down to 6.5%

## 2024-01-09 ENCOUNTER — Encounter: Payer: Self-pay | Admitting: Nutrition

## 2024-01-30 LAB — HM MAMMOGRAPHY

## 2024-02-12 ENCOUNTER — Ambulatory Visit: Admitting: Internal Medicine

## 2024-02-12 ENCOUNTER — Encounter: Payer: Self-pay | Admitting: Internal Medicine

## 2024-02-12 ENCOUNTER — Other Ambulatory Visit: Payer: Self-pay

## 2024-02-12 VITALS — BP 118/66 | HR 82 | Temp 98.2°F | Wt 137.6 lb

## 2024-02-12 DIAGNOSIS — J310 Chronic rhinitis: Secondary | ICD-10-CM

## 2024-02-12 DIAGNOSIS — J4530 Mild persistent asthma, uncomplicated: Secondary | ICD-10-CM | POA: Diagnosis not present

## 2024-02-12 DIAGNOSIS — Z91014 Allergy to mammalian meats: Secondary | ICD-10-CM

## 2024-02-12 MED ORDER — NEFFY 2 MG/0.1ML NA SOLN: 2.0000 mg | NASAL | 1 refills | Status: AC | PRN

## 2024-02-12 NOTE — Patient Instructions (Addendum)
 1. Allergic reaction to alpha-gal - please strictly avoid mammalian meat.  Okay to eat seafood, turkey, chicken.   - for SKIN only reaction, okay to take Zyrtec  liquid 10 mg every 12 hours as needed - for SKIN + ANY additional symptoms, OR IF concern for LIFE THREATENING reaction = Epipen  Autoinjector EpiPen  0.3 mg or Neffy  2mg .   - If using Epinephrine  autoinjector, call 911 - Okay to use Doctors Best Vegan D3 2500 IU (Vitashine), Plant Sourced Vitamin D3, Vegan 60 Veggie Caps  2. Itching - Use Zyrtec  10mg  daily as needed for itching. - Do a daily soaking tub bath in warm water for 10-15 minutes.  - Use a gentle, unscented cleanser at the end of the bath (such as Dove unscented bar or baby wash, or Aveeno sensitive body wash). Then rinse, pat half-way dry, and apply a gentle, unscented moisturizer cream or ointment (Cerave, Cetaphil, Eucerin, Aveeno, Aquaphor, Vanicream, Vaseline)  all over while still damp. Dry skin makes the itching worse. The skin should be moisturized with a gentle, unscented moisturizer at least twice daily.  - Use only unscented liquid laundry detergent.   3. Chronic Rhinitis - Use nasal saline rinses such as with Neilmed Sinus Rinse.  Use distilled water.   - Use Zyrtec  10 mg daily as needed for runny nose, sneezing, itchy watery eyes.    4. Mild persistent asthma - Daily controller medication(s): Asmanex  200mcg 1 puff twice daily. Okay to increase to 2 puffs twice daily for uncontrolled symptoms.   - Prior to physical activity: Xopenex or Albuterol  2 puffs 10-15 minutes before physical activity. - Rescue medications: Xopenex or Albuterol  2 puffs every 4-6 hours as needed for wheezing/shortness of breath  - Asthma control goals:  * Full participation in all desired activities (may need albuterol  before activity) * Albuterol  use two time or less a week on average (not counting use with activity) * Cough interfering with sleep two time or less a month * Oral steroids  no more than once a year * No hospitalizations   Vocal Cord Dysfunction Breathing Exercises Use these breathing techniques at any sign of shortness of breath, wheezing, tightness or stridor/noisy breathing. If this occurs during activity, stop activity, do exercise until it stops and then resume activity gradually. Remember Tightness or stridor can be released by breathing exercises Do exercises easily - don't push shoulders or chest Concentrate on letting air in and out Go into new activities and sports gradually Diaphragmatic breathing exercises Do 10 cycles X3 Practice 2-3 times per day, lying down and sitting up & standing Concentrate on deep diaphragmatic breathing; relaxation of the entire upper body; and increased breath capacity Practice in a quiet environment to help encourage focus Have adult supervision until child can practice with ease on their own Once good breathing practice is established, practice more frequently throughout the day Review your mental checklist during breathing exercises Are my face, jaw, tongue relaxed? Is my throat open and relaxed? Are my shoulders relaxed and not moving? Is my chest relaxed and not moving? Is my diaphragm doing all the work: moving out for inhalation and in for exhalation? Is my breathing rate slow and rhythmic? Is my breath full and relaxed (can count to 2 seconds on inhalation and 4-5 seconds on exhalation) Swallow-breathe technique Swallow followed by exhalation and initiation of diaphragmatic breathing. Do 10 full cycles of inhale/exhale. Continue with multiple cycles if needed, until the vocal cord dysfunction goes away. If a vocal cord dysfunction event  refuses to be suppressed with this technique, analyze the problem more fully and consult medical assistance, if needed. Relaxed throat breath Do 5 of these relaxed throat breaths in the morning, at noon, before bedtime, before medications, as needed. Hand on abdomen (above  the belt or both) when needed Inhale into abdomen- abdomen comes out Exhale from abdomen-abdomen comes in Inhale with relaxed throat: Tongue on floor of mouth Lips gently closed Jaw gently released Exhale

## 2024-02-12 NOTE — Progress Notes (Signed)
 "  FOLLOW UP Date of Service/Encounter:  02/12/2024   Subjective:  Stephanie Carroll (DOB: [birth year]) is a 68 y.o. female who [facility] for follow up for asthma, alpha gal, chronic rhinitis, itching.   History obtained from: chart review and patient. Last seen on 11/15/2023 with Dr Iva: Alpha gal- check levels, alpha gal IgE was 13, down from prior. Discussed avoiding muscle test/acupuncture. Allergies- Zyrtec  PRN Asthma- Asmanex  200mcg 1 puffs BID controlled.   Itching- food labs negative except sesame slightly elevated, slightly elevated ANA but with low titers and normal ESR/CRP, normal tryptase, normal RF  Notes having random episodes of shortness of breath, most of the times occurring when she is sitting without doing anything and can occur suddenly; denies any choking/hoarseness/voice changes.  Has trouble with anxiety and wonders if that is related. Using Asmanex  1 puff BID; denies any trouble with wheezing/coughing. Not having much trouble with frequent congestion, drainage, runny nose.   Rarely needs her rescue. Denies any ER/urgent care/oral prednisone  use.  Using Zyrtec  PRN; notes taking the liquid form due to her alpha gal.   She is very worried about her alpha gal diagnosis; has joined online groups.  Notes avoiding all dairy and gelatin in addition to mammalian meats.  Cooks at home. No accidental exposures since last visit.  Would be interested in Neffy  instead of Epipen  for accidental exposures.  Has a low vitamin D  and was asking what to use for replacement.   Past Medical History: Past Medical History:  Diagnosis Date   Allergy     Arthritis    DDD   Asthma    Colon cancer (HCC) 2008   Diabetes mellitus    Elevated liver enzymes    GERD (gastroesophageal reflux disease)    History of thyroiditis 12/18/2007   Formatting of this note might be different from the original. Thyroiditis   Hyperlipidemia    Iron deficiency anemia  due to chronic blood loss 09/17/2020   Thyroid  tumor, benign     Objective:  BP 118/66 (BP Location: Left Arm, Patient Position: Sitting, Cuff Size: Normal)   Pulse 82   Temp 98.2 F (36.8 C) (Temporal)   Wt 137 lb 9.6 oz (62.4 kg)   SpO2 98%   BMI 26.00 kg/m  Body mass index is 26 kg/m. Physical Exam: GEN: alert, well developed HEENT: clear conjunctiva, nose without inferior turbinate hypertrophy, pink nasal mucosa, no rhinorrhea, no cobblestoning HEART: regular rate and rhythm, no murmur LUNGS: clear to auscultation bilaterally, no coughing, unlabored respiration SKIN: no rashes or lesions  Spirometry:  Tracings reviewed. Her effort: Good reproducible efforts. FVC: 2.16L, 84% predicted  FEV1: 1.86L, 92% predicted FEV1/FVC ratio: 86% Interpretation: Spirometry consistent with normal pattern.  Please see scanned spirometry results for details.  Assessment:   1. Chronic rhinitis   2. Alpha-gal syndrome   3. Mild persistent asthma, uncomplicated     Plan/Recommendations:   1. Allergic reaction to alpha-gal - please strictly avoid mammalian meat.  Okay to eat seafood, turkey, chicken.   - alpha gal IgE 11/2023: 13 kU/L  - for SKIN only reaction, okay to take Zyrtec  10 mg every 12 hours as needed - for SKIN + ANY additional symptoms, OR IF concern for LIFE THREATENING reaction = Epipen  Autoinjector EpiPen  0.3 mg or Neffy  2mg .   - If using Epinephrine  autoinjector, call 911 - Okay to use Doctors Best Vegan D3 2500 IU (Vitashine), Plant Sourced Vitamin D3, Vegan 60  Veggie Caps  2. Itching - Use Zyrtec  10mg  daily as needed for itching. - Do a daily soaking tub bath in warm water for 10-15 minutes.  - Use a gentle, unscented cleanser at the end of the bath (such as Dove unscented bar or baby wash, or Aveeno sensitive body wash). Then rinse, pat half-way dry, and apply a gentle, unscented moisturizer cream or ointment (Cerave, Cetaphil, Eucerin, Aveeno, Aquaphor, Vanicream,  Vaseline)  all over while still damp. Dry skin makes the itching worse. The skin should be moisturized with a gentle, unscented moisturizer at least twice daily.  - Use only unscented liquid laundry detergent.   3. Chronic Rhinitis - Controlled  - Use nasal saline rinses such as with Neilmed Sinus Rinse.  Use distilled water.   - Use Zyrtec  10 mg daily as needed for runny nose, sneezing, itchy watery eyes.  - If symptoms worsen, can consider aeroallergen testing in future.    4. Mild persistent asthma - Controlled, sudden onset SOB without triggers likely related to anxiety/possibly VCD, discussed to try breathing techniques as noted below. Spirometry today was normal.  - Daily controller medication(s): Asmanex  200mcg 1 puff twice daily. Okay to increase to 2 puffs twice daily for uncontrolled symptoms.   - Prior to physical activity: Xopenex or Albuterol  2 puffs 10-15 minutes before physical activity. - Rescue medications: Xopenex or Albuterol  2 puffs every 4-6 hours as needed for wheezing/shortness of breath  - Asthma control goals:  * Full participation in all desired activities (may need albuterol  before activity) * Albuterol  use two time or less a week on average (not counting use with activity) * Cough interfering with sleep two time or less a month * Oral steroids no more than once a year * No hospitalizations   Vocal Cord Dysfunction Breathing Exercises Use these breathing techniques at any sign of shortness of breath, wheezing, tightness or stridor/noisy breathing. If this occurs during activity, stop activity, do exercise until it stops and then resume activity gradually. Remember Tightness or stridor can be released by breathing exercises Do exercises easily - don't push shoulders or chest Concentrate on letting air in and out Go into new activities and sports gradually Diaphragmatic breathing exercises Do 10 cycles X3 Practice 2-3 times per day, lying down and sitting up &  standing Concentrate on deep diaphragmatic breathing; relaxation of the entire upper body; and increased breath capacity Practice in a quiet environment to help encourage focus Have adult supervision until child can practice with ease on their own Once good breathing practice is established, practice more frequently throughout the day Review your mental checklist during breathing exercises Are my face, jaw, tongue relaxed? Is my throat open and relaxed? Are my shoulders relaxed and not moving? Is my chest relaxed and not moving? Is my diaphragm doing all the work: moving out for inhalation and in for exhalation? Is my breathing rate slow and rhythmic? Is my breath full and relaxed (can count to 2 seconds on inhalation and 4-5 seconds on exhalation) Swallow-breathe technique Swallow followed by exhalation and initiation of diaphragmatic breathing. Do 10 full cycles of inhale/exhale. Continue with multiple cycles if needed, until the vocal cord dysfunction goes away. If a vocal cord dysfunction event refuses to be suppressed with this technique, analyze the problem more fully and consult medical assistance, if needed. Relaxed throat breath Do 5 of these relaxed throat breaths in the morning, at noon, before bedtime, before medications, as needed. Hand on abdomen (above the belt or  both) when needed Inhale into abdomen- abdomen comes out Exhale from abdomen-abdomen comes in Inhale with relaxed throat: Tongue on floor of mouth Lips gently closed Jaw gently released Exhale       Return in about 6 months (around 08/12/2024).  Arleta Blanch, MD [facility]        "

## 2024-02-22 ENCOUNTER — Ambulatory Visit: Payer: Self-pay

## 2024-02-22 ENCOUNTER — Ambulatory Visit (INDEPENDENT_AMBULATORY_CARE_PROVIDER_SITE_OTHER): Admitting: Family Medicine

## 2024-02-22 ENCOUNTER — Encounter: Payer: Self-pay | Admitting: Family Medicine

## 2024-02-22 VITALS — BP 132/73 | HR 114 | Temp 97.6°F | Ht 61.0 in | Wt 134.2 lb

## 2024-02-22 DIAGNOSIS — J069 Acute upper respiratory infection, unspecified: Secondary | ICD-10-CM | POA: Diagnosis not present

## 2024-02-22 DIAGNOSIS — J4530 Mild persistent asthma, uncomplicated: Secondary | ICD-10-CM

## 2024-02-22 LAB — VERITOR SARS-COV-2 AND FLU A+B
BD Veritor SARS-CoV-2 Ag: NEGATIVE
Influenza A: NEGATIVE
Influenza B: NEGATIVE

## 2024-02-22 NOTE — Telephone Encounter (Signed)
 Apt scheduled.

## 2024-02-22 NOTE — Telephone Encounter (Signed)
" °  FYI Only or Action Required?: Action required by provider: request for appointment.  Patient was last seen in primary care on 10/27/2023 by Lavell Bari LABOR, FNP.  Called Nurse Triage reporting Nasal Congestion.  Symptoms began several days ago.  Interventions attempted: Nothing.  Symptoms are: gradually worsening.Sinus pain, congestion, ribs hurt. Chills.  Triage Disposition: See HCP Within 4 Hours (Or PCP Triage)  Patient/caregiver understands and will follow disposition?: Yes      Copied from CRM #[id number]. Topic: Clinical - Red Word Triage >> Feb 22, 2024  8:06 AM Stephanie Carroll wrote: Red Word that prompted transfer to Nurse Triage: Patient called in with complaints of shortness of breath and congestion. Patient feels like she is trying to catch her breath and this has kept her up the majority of the night. Reason for Disposition  [1] SEVERE sinus pain (e.g., excruciating) AND [2] not improved 2 hours after pain medicine  Answer Assessment - Initial Assessment Questions 1. LOCATION: Where does it hurt?      nose 2. ONSET: When did the sinus pain start?  (e.g., hours, days)      2 days 3. SEVERITY: How bad is the pain?   (Scale 0-10; or none, mild, moderate or severe)     8 4. RECURRENT SYMPTOM: Have you ever had sinus problems before? If Yes, ask: When was the last time? and What happened that time?      yes 5. NASAL CONGESTION: Is the nose blocked? If Yes, ask: Can you open it or must you breathe through your mouth?     no 6. NASAL DISCHARGE: Do you have discharge from your nose? If so ask, What color?     green 7. FEVER: Do you have a fever? If Yes, ask: What is it, how was it measured, and when did it start?      no 8. OTHER SYMPTOMS: Do you have any other symptoms? (e.g., sore throat, cough, earache, difficulty breathing)     Cough, chills, rib pain 9. PREGNANCY: Is there any chance you are pregnant? When was your last menstrual period?      no  Protocols used: Sinus Pain or Congestion-A-AH  "

## 2024-02-22 NOTE — Progress Notes (Signed)
 "  Acute Office Visit  Subjective:     Patient ID: Stephanie Carroll, female    DOB: 1955-12-08, 69 y.o.   MRN: [id number]  Chief Complaint  Patient presents with   Nasal Congestion    HPI  History of Present Illness   Stephanie Carroll is a 69 year old female with asthma who presents with shortness of breath and congestion.  She has been experiencing head congestion since Tuesday. There is no cough, but she needs to clear her throat due to drainage. She reports shortness of breath at her baseline that is unchanged. She has a history of asthma but denies increased symptoms currently. She is also experiencing some diarrhea and abdominal pain. She denies fever, chills, ear pain, sore throat, nausea, or vomiting. She has been taking Mucinex and tries to be cautious about her medication intake.  She takes Zyrtec  regularly and has been around sick family members recently.       ROS As per HPI.      Objective:    BP 132/73   Pulse (!) 114   Temp 97.6 F (36.4 C) (Oral)   Ht 5' 1 (1.549 m)   Wt 134 lb 3.2 oz (60.9 kg)   SpO2 97%   BMI 25.36 kg/m  Wt Readings from Last 3 Encounters:  02/22/24 134 lb 3.2 oz (60.9 kg)  02/12/24 137 lb 9.6 oz (62.4 kg)  01/03/24 134 lb (60.8 kg)      Physical Exam Vitals and nursing note reviewed.  Constitutional:      General: She is not in acute distress.    Appearance: She is not ill-appearing, toxic-appearing or diaphoretic.  HENT:     Right Ear: Tympanic membrane, ear canal and external ear normal.     Left Ear: Tympanic membrane, ear canal and external ear normal.     Nose: Congestion present.     Mouth/Throat:     Mouth: Mucous membranes are moist.     Pharynx: Oropharynx is clear. No pharyngeal swelling, oropharyngeal exudate, posterior oropharyngeal erythema, uvula swelling or postnasal drip.     Tonsils: No tonsillar exudate or tonsillar abscesses. 1+ on the right. 1+ on the left.  Eyes:     General:        Right eye: No discharge.         Left eye: No discharge.  Cardiovascular:     Rate and Rhythm: Normal rate and regular rhythm.     Heart sounds: Normal heart sounds. No murmur heard. Pulmonary:     Effort: Pulmonary effort is normal. No respiratory distress.     Breath sounds: Normal breath sounds. No wheezing, rhonchi or rales.  Abdominal:     General: There is no distension.     Tenderness: There is no abdominal tenderness. There is no guarding or rebound.  Musculoskeletal:     Cervical back: No rigidity.     Right lower leg: No edema.     Left lower leg: No edema.  Skin:    General: Skin is warm and dry.  Neurological:     General: No focal deficit present.     Mental Status: She is alert and oriented to person, place, and time.  Psychiatric:        Mood and Affect: Mood normal.        Behavior: Behavior normal.     No results found for any visits on 02/22/24.      Assessment & Plan:   Chisa  was seen today for nasal congestion.  Diagnoses and all orders for this visit:  Viral URI -     Veritor SARS-CoV-2 and Flu A+B  Chronic asthma, mild persistent, uncomplicated   Assessment and Plan    Acute upper respiratory infection Likely viral etiology. Negative covid and flu test today.  - Continue over-the-counter medications for symptom relief. - Increase fluid intake. - Monitor symptoms and contact if no improvement after a week.  Asthma Reports symptoms at baseline.  - Monitor for increased asthma symptoms. - Contact if asthma symptoms worsen.      Return to office for new or worsening symptoms, or if symptoms persist.   The patient indicates understanding of these issues and agrees with the plan.  Stephanie CHRISTELLA Search, FNP   "

## 2024-02-26 ENCOUNTER — Encounter: Payer: Self-pay | Admitting: Nurse Practitioner

## 2024-02-26 ENCOUNTER — Ambulatory Visit (INDEPENDENT_AMBULATORY_CARE_PROVIDER_SITE_OTHER): Admitting: Nurse Practitioner

## 2024-02-26 ENCOUNTER — Telehealth: Payer: Self-pay | Admitting: Family

## 2024-02-26 ENCOUNTER — Ambulatory Visit: Payer: Self-pay

## 2024-02-26 VITALS — BP 129/68 | HR 89 | Temp 98.0°F | Ht 61.0 in | Wt 134.0 lb

## 2024-02-26 DIAGNOSIS — J0110 Acute frontal sinusitis, unspecified: Secondary | ICD-10-CM | POA: Diagnosis not present

## 2024-02-26 MED ORDER — AZELASTINE HCL 0.1 % NA SOLN: 1.0000 | Freq: Two times a day (BID) | NASAL | 5 refills | Status: AC

## 2024-02-26 MED ORDER — AZITHROMYCIN 250 MG PO TABS: ORAL_TABLET | ORAL | 0 refills | Status: AC

## 2024-02-26 NOTE — Telephone Encounter (Signed)
 Copied from CRM [id number]. Topic: Clinical - Prescription Issue >> Feb 26, 2024 11:06 AM Alfonso ORN wrote: Reason for CRM: patient seen her provider today for a visit and patient requesting to switch the medication the zpak (patient not sure of the medication sent to the pharmacy ) to a  liquid pt do not want the pill  pharmacy stated the pills contain lactose which made be an issue for patient issue she having . The medication in the pill form is already at the pharmacy however waiting for provider to switch the medication to a liquid form instead   [id number] - [place], [place] - [address] [place] [address] Phone: [phone] Fax: [phone] Hours: Not open 24 hours

## 2024-02-26 NOTE — Telephone Encounter (Signed)
 FYI Only or Action Required?: Action required by provider: request for appointment.  Patient was last seen in primary care on 02/22/2024 by [name], FNP.  Called Nurse Triage reporting Shortness of Breath.  Symptoms began several weeks ago.  Interventions attempted: Rest, hydration, or home remedies.  Symptoms are: unchanged. Still having SOD, now has right side back pain, congestion.  Triage Disposition: See HCP Within 4 Hours (Or PCP Triage)  Patient/caregiver understands and will follow disposition?: Yes      Copied from CRM [id number]. Topic: Clinical - Red Word Triage >> Feb 26, 2024  8:07 AM Stephanie Carroll wrote: Red Word that prompted transfer to Nurse Triage: Patient states she was just in the office last week & they told her if she's not better, to come back. Patient states she's congested & have pain in the right side of her back. States pain is 8/10. Reason for Disposition  [1] MILD difficulty breathing (e.g., minimal/no SOB at rest, SOB with walking, pulse < 100) AND [2] NEW-onset or WORSE than normal  Answer Assessment - Initial Assessment Questions 1. RESPIRATORY STATUS: Describe your breathing? (e.g., wheezing, shortness of breath, unable to speak, severe coughing)      SOB 2. ONSET: When did this breathing problem begin?      2 weeks 3. PATTERN Does the difficult breathing come and go, or has it been constant since it started?      constant 4. SEVERITY: How bad is your breathing? (e.g., mild, moderate, severe)      medium 5. RECURRENT SYMPTOM: Have you had difficulty breathing before? If Yes, ask: When was the last time? and What happened that time?      yes 6. CARDIAC HISTORY: Do you have any history of heart disease? (e.g., heart attack, angina, bypass surgery, angioplasty)      no 7. LUNG HISTORY: Do you have any history of lung disease?  (e.g., pulmonary embolus, asthma, emphysema)     Asthma  8. CAUSE: What do you think is causing the  breathing problem?      unsure 9. OTHER SYMPTOMS: Do you have any other symptoms? (e.g., chest pain, cough, dizziness, fever, runny nose)     Back pain on right 10. O2 SATURATION MONITOR:  Do you use an oxygen saturation monitor (pulse oximeter) at home? If Yes, ask: What is your reading (oxygen level) today? What is your usual oxygen saturation reading? (e.g., 95%)       97% 11. PREGNANCY: Is there any chance you are pregnant? When was your last menstrual period?       no 12. TRAVEL: Have you traveled out of the country in the last month? (e.g., travel history, exposures)       no  Protocols used: Breathing Difficulty-A-AH

## 2024-02-26 NOTE — Telephone Encounter (Signed)
 NOTED. ls

## 2024-02-26 NOTE — Progress Notes (Signed)
 "    Subjective:  Patient ID: Stephanie Carroll, female    DOB: 05-Dec-1955, 69 y.o.   MRN: [id number]  Patient Care Team: Lavell Bari LABOR, FNP as PCP - General (Family Medicine) Octavia, Charlie Hamilton, MD as Consulting Physician (Ophthalmology) Bonnielee Rayleen POUR, MD as Referring Physician (Obstetrics and Gynecology) Darlean Ozell NOVAK, MD as Consulting Physician (Pulmonary Disease)   Chief Complaint:  Shortness of Breath (Seen last week for this. Still having SOB and pain in ribs and back on right side. Concerned about pneumonia. Feels like mucous is stuck in chest. paulett gal allergy )   HPI: Stephanie Carroll is a 69 y.o. female presenting on 02/26/2024 for Shortness of Breath (Seen last week for this. Still having SOB and pain in ribs and back on right side. Concerned about pneumonia. Feels like mucous is stuck in chest. paulett gal allergy )   Discussed the use of AI scribe software for clinical note transcription with the patient, who gave verbal consent to proceed.  History of Present Illness Stephanie Carroll is a 69 year old female with asthma who presents with shortness of breath.  She has been experiencing shortness of breath since last Wednesday. Initially, she had rib pain, which has since moved to her back area on the right side. She has been using her inhaler approximately every six hours to manage her symptoms. She also reports a dry cough and drainage, which she manages by swishing salt water and using nasal saline rinses. No fever or sore throat.  She has a history of asthma and uses Asmanex  as maintenance therapy and Ventolin  as needed. She sees a pulmonologist annually for her asthma management. She is cautious about medications due to her alpha-gal allergy , which affects her ability to take certain medications, particularly those in capsule form. She is also concerned about her diabetes, which can be affected by certain treatments.        Relevant past medical, surgical, family,  and social history reviewed and updated as indicated.  Allergies and medications reviewed and updated. Data reviewed: Chart in Epic.   Past Medical History:  Diagnosis Date   Allergy     Arthritis    DDD   Asthma    Colon cancer (HCC) 2008   Diabetes mellitus    Elevated liver enzymes    GERD (gastroesophageal reflux disease)    History of thyroiditis 12/18/2007   Formatting of this note might be different from the original. Thyroiditis   Hyperlipidemia    Iron deficiency anemia due to chronic blood loss 09/17/2020   Thyroid  tumor, benign     Past Surgical History:  Procedure Laterality Date   BIOPSY THYROID      X2   CESAREAN SECTION     x 1   COLON SURGERY     FOOT FRACTURE SURGERY Left 02/2015   INCISIONAL HERNIA REPAIR     NASAL SEPTUM SURGERY     PARTIAL HYSTERECTOMY     POLYPECTOMY     UPPER GASTROINTESTINAL ENDOSCOPY     WRIST GANGLION EXCISION     left    Social History   Socioeconomic History   Marital status: Married    Spouse name: Not on file   Number of children: 2   Years of education: Not on file   Highest education level: Not on file  Occupational History   Occupation: claims for [employer]    Employer: [employer]  Tobacco Use   Smoking status: Never   Smokeless tobacco: Never  Tobacco comments:    never used product  Vaping Use   Vaping status: Never Used  Substance and Sexual Activity   Alcohol use: No    Alcohol/week: 0.0 standard drinks of alcohol   Drug use: No   Sexual activity: Not on file  Other Topics Concern   Not on file  Social History Narrative   Not on file   Social Drivers of Health   Tobacco Use: Low Risk (02/26/2024)   Patient History    Smoking Tobacco Use: Never    Smokeless Tobacco Use: Never    Passive Exposure: Not on file  Financial Resource Strain: Low Risk (11/23/2023)   Overall Financial Resource Strain (CARDIA)    Difficulty of Paying Living Expenses: Not hard at all  Food Insecurity: No Food Insecurity  (11/23/2023)   Epic    Worried About Programme Researcher, Broadcasting/film/video in the Last Year: Never true    Ran Out of Food in the Last Year: Never true  Transportation Needs: No Transportation Needs (11/23/2023)   Epic    Lack of Transportation (Medical): No    Lack of Transportation (Non-Medical): No  Physical Activity: Inactive (11/23/2023)   Exercise Vital Sign    Days of Exercise per Week: 0 days    Minutes of Exercise per Session: 0 min  Stress: Stress Concern Present (11/23/2023)   [employer] of Occupational Health - Occupational Stress Questionnaire    Feeling of Stress: To some extent  Social Connections: Socially Integrated (11/23/2023)   Social Connection and Isolation Panel    Frequency of Communication with Friends and Family: More than three times a week    Frequency of Social Gatherings with Friends and Family: More than three times a week    Attends Religious Services: 1 to 4 times per year    Active Member of Clubs or Organizations: Yes    Attends Banker Meetings: More than 4 times per year    Marital Status: Married  Catering Manager Violence: Not At Risk (11/23/2023)   Epic    Fear of Current or Ex-Partner: No    Emotionally Abused: No    Physically Abused: No    Sexually Abused: No  Depression (PHQ2-9): Low Risk (02/26/2024)   Depression (PHQ2-9)    PHQ-2 Score: 2  Alcohol Screen: Low Risk (11/23/2023)   Alcohol Screen    Last Alcohol Screening Score (AUDIT): 0  Housing: Unknown (11/23/2023)   Epic    Unable to Pay for Housing in the Last Year: No    Number of Times Moved in the Last Year: Not on file    Homeless in the Last Year: No  Utilities: Not At Risk (11/23/2023)   Epic    Threatened with loss of utilities: No  Health Literacy: Adequate Health Literacy (11/23/2023)   B1300 Health Literacy    Frequency of need for help with medical instructions: Never    Outpatient Encounter Medications as of 02/26/2024  Medication Sig   albuterol  (VENTOLIN  HFA) 108  (90 Base) MCG/ACT inhaler Inhale 1-2 puffs into the lungs every 4 (four) hours as needed. For shortness for breath   azelastine  (ASTELIN ) 0.1 % nasal spray Place 1 spray into both nostrils 2 (two) times daily. Use in each nostril as directed   azithromycin  (ZITHROMAX ) 250 MG tablet Take 2 tablets on day 1, then 1 tablet daily on days 2 through 5   BLACK ELDERBERRY PO Take 1 tablet by mouth daily.   Blood Glucose Monitoring Suppl (  ONETOUCH VERIO REFLECT) w/Device KIT USE TO TEST BLOOD SUGAR ONCE DAILY IN THE EVENING   cetirizine  (ZYRTEC  ALLERGY ) 10 MG tablet Take 1 tablet (10 mg total) by mouth daily.   Continuous Glucose Receiver (DEXCOM G7 RECEIVER) DEVI USE AS DIRECTED   Continuous Glucose Sensor (DEXCOM G7 SENSOR) MISC USE AS DIRECTED   EPINEPHrine  (NEFFY ) 2 MG/0.1ML SOLN Place 2 mg into the nose as needed (anaphylaxis).   EPINEPHrine  0.3 mg/0.3 mL IJ SOAJ injection Inject 0.3 mg into the muscle as needed for anaphylaxis.   insulin  glargine (LANTUS  SOLOSTAR) 100 UNIT/ML Solostar Pen Inject 20 Units into the skin daily.   Insulin  Pen Needle 32G X 4 MM MISC 1 Device by Does not apply route daily in the afternoon.   levalbuterol (XOPENEX) 1.25 MG/3ML nebulizer solution Take 1.25 mg by nebulization as needed for wheezing.   metFORMIN  (GLUCOPHAGE ) 500 MG tablet Take 2 tablets (1,000 mg total) by mouth 2 (two) times daily with a meal.   Mometasone Furoate  (ASMANEX  HFA) 200 MCG/ACT AERO Inhale 1 puff into the lungs in the morning and at bedtime.   ONETOUCH VERIO test strip USE TO CHECK BLOOD SUGAR 2 (TWO) TIMES DAILY. USE AS INSTRUCTED   RABEprazole  (ACIPHEX ) 20 MG tablet Take 1 tablet (20 mg total) by mouth 2 (two) times daily.   Spacer/Aero-Holding Chambers (AEROCHAMBER PLUS WITH MASK) inhaler Use as directed   ferrous gluconate  (FERGON) 324 MG tablet Take 1 tablet (324 mg total) by mouth daily with breakfast. (Patient not taking: Reported on 02/22/2024)   NON FORMULARY Plexus Probio 5 dietary  supplement (Patient not taking: Reported on 02/22/2024)   NON FORMULARY Plexus Slim Hunger Control weight management series (Patient not taking: Reported on 02/22/2024)   ondansetron  (ZOFRAN ) 4 MG/5ML solution Take 5 mLs (4 mg total) by mouth daily as needed for nausea or vomiting. (Patient not taking: Reported on 02/22/2024)   rosuvastatin  (CRESTOR ) 5 MG tablet Take 1 tablet (5 mg total) by mouth daily. (Patient not taking: Reported on 02/22/2024)   Vitamin D , Ergocalciferol , (DRISDOL ) 1.25 MG (50000 UNIT) CAPS capsule Take 1 capsule (50,000 Units total) by mouth every 7 (seven) days. (Patient not taking: Reported on 02/22/2024)   No facility-administered encounter medications on file as of 02/26/2024.    Allergies[1]  Pertinent ROS per HPI, otherwise unremarkable      Objective:  BP 129/68   Pulse 89   Temp 98 F (36.7 C)   Ht 5' 1 (1.549 m)   Wt 134 lb (60.8 kg)   SpO2 99%   BMI 25.32 kg/m    Wt Readings from Last 3 Encounters:  02/26/24 134 lb (60.8 kg)  02/22/24 134 lb 3.2 oz (60.9 kg)  02/12/24 137 lb 9.6 oz (62.4 kg)    Physical Exam Vitals and nursing note reviewed.  Constitutional:      General: She is not in acute distress. HENT:     Head: Normocephalic and atraumatic.     Right Ear: Hearing, tympanic membrane, ear canal and external ear normal.     Left Ear: Hearing, tympanic membrane, ear canal and external ear normal.     Nose: Congestion present.     Right Turbinates: Swollen.     Left Turbinates: Swollen.     Right Sinus: Frontal sinus tenderness present.     Left Sinus: Frontal sinus tenderness present.     Mouth/Throat:     Mouth: Mucous membranes are moist.     Pharynx: Postnasal drip present.  Eyes:  General: No scleral icterus.    Extraocular Movements: Extraocular movements intact.     Conjunctiva/sclera: Conjunctivae normal.     Pupils: Pupils are equal, round, and reactive to light.  Cardiovascular:     Heart sounds: Normal heart sounds.   Pulmonary:     Effort: Pulmonary effort is normal.     Breath sounds: Normal breath sounds.  Abdominal:     General: Bowel sounds are normal.     Palpations: Abdomen is soft.  Skin:    General: Skin is warm.     Findings: No rash.  Neurological:     Mental Status: She is alert and oriented to person, place, and time.  Psychiatric:        Mood and Affect: Mood normal.        Behavior: Behavior normal.        Thought Content: Thought content normal.        Judgment: Judgment normal.    Physical Exam HEENT: Sinus tenderness indicating sinusitis. CHEST: Faint wheezing in lower lungs.     Results for orders placed or performed in visit on 02/22/24  Veritor SARS-CoV-2 and Flu A+B   Collection Time: 02/22/24 10:55 AM   Specimen: Nasal Swab   Nasal Swab  Result Value Ref Range   Influenza A Negative Negative   Influenza B Negative Negative   BD Veritor SARS-CoV-2 Ag Negative Negative       Pertinent labs & imaging results that were available during my care of the patient were reviewed by me and considered in my medical decision making.  Assessment & Plan:  Stephanie Carroll was seen today for shortness of breath.  Diagnoses and all orders for this visit:  Acute non-recurrent frontal sinusitis -     azelastine  (ASTELIN ) 0.1 % nasal spray; Place 1 spray into both nostrils 2 (two) times daily. Use in each nostril as directed -     azithromycin  (ZITHROMAX ) 250 MG tablet; Take 2 tablets on day 1, then 1 tablet daily on days 2 through 5     Assessment and Plan Stephanie Carroll is a 57 year old Caucasian female seen today for acute sinusitis, no acute distress Assessment & Plan Acute frontal sinusitis Symptoms include sinus pressure and postnasal drip. Negative for flu, COVID, and pneumonia. - Prescribed Astelin  nasal spray, one puff in each nostril twice daily. - Prescribed Z-Pak (azithromycin ), two tablets on day one, then one tablet daily for five days.  Asthma Exacerbation with shortness of  breath and wheezing. Continue Ventolin  as previously prescribed      Continue all other maintenance medications.  Follow up plan: Return if symptoms worsen or fail to improve.   Continue healthy lifestyle choices, including diet (rich in fruits, vegetables, and lean proteins, and low in salt and simple carbohydrates) and exercise (at least 30 minutes of moderate physical activity daily).  Educational handout given for   Clinical References  Sinus Infection, Adult A sinus infection is soreness and swelling (inflammation) of your sinuses. Sinuses are hollow spaces in the bones around your face. They are located: Around your eyes. In the middle of your forehead. Behind your nose. In your cheekbones. Your sinuses and nasal passages are lined with a fluid called mucus. Mucus drains out of your sinuses. Swelling can trap mucus in your sinuses. This lets germs (bacteria, virus, or fungus) grow, which leads to infection. Most of the time, this condition is caused by a virus. What are the causes? Allergies. Asthma. Germs. Things that block your nose or  sinuses. Growths in the nose (nasal polyps). Chemicals or irritants in the air. A fungus. This is rare. What increases the risk? Having a weak body defense system (immune system). Doing a lot of swimming or diving. Using nasal sprays too much. Smoking. What are the signs or symptoms? The main symptoms of this condition are pain and a feeling of pressure around the sinuses. Other symptoms include: Stuffy nose (congestion). This may make it hard to breathe through your nose. Runny nose (drainage). Soreness, swelling, and warmth in the sinuses. A cough that may get worse at night. Being unable to smell and taste. Mucus that collects in the throat or the back of the nose (postnasal drip). This may cause a sore throat or bad breath. Being very tired (fatigued). A fever. How is this diagnosed? Your symptoms. Your medical history. A  physical exam. Tests to find out if your condition is short-term (acute) or long-term (chronic). Your doctor may: Check your nose for growths (polyps). Check your sinuses using a tool that has a light on one end (endoscope). Check for allergies or germs. Do imaging tests, such as an MRI or CT scan. How is this treated? Treatment for this condition depends on the cause and whether it is short-term or long-term. If caused by a virus, your symptoms should go away on their own within 10 days. You may be given medicines to relieve symptoms. They include: Medicines that shrink swollen tissue in the nose. A spray that treats swelling of the nostrils. Rinses that help get rid of thick mucus in your nose (nasal saline washes). Medicines that treat allergies (antihistamines). Over-the-counter pain relievers. If caused by bacteria, your doctor may wait to see if you will get better without treatment. You may be given antibiotic medicine if you have: A very bad infection. A weak body defense system. If caused by growths in the nose, surgery may be needed. Follow these instructions at home: Medicines Take, use, or apply over-the-counter and prescription medicines only as told by your doctor. These may include nasal sprays. If you were prescribed an antibiotic medicine, take it as told by your doctor. Do not stop taking it even if you start to feel better. Hydrate and humidify  Drink enough water to keep your pee (urine) pale yellow. Use a cool mist humidifier to keep the humidity level in your home above 50%. Breathe in steam for 10-15 minutes, 3-4 times a day, or as told by your doctor. You can do this in the bathroom while a hot shower is running. Try not to spend time in cool or dry air. Rest Rest as much as you can. Sleep with your head raised (elevated). Make sure you get enough sleep each night. General instructions  Put a warm, moist washcloth on your face 3-4 times a day, or as often as  told by your doctor. Use nasal saline washes as often as told by your doctor. Wash your hands often with soap and water. If you cannot use soap and water, use hand sanitizer. Do not smoke. Avoid being around people who are smoking (secondhand smoke). Keep all follow-up visits. Contact a doctor if: You have a fever. Your symptoms get worse. Your symptoms do not get better within 10 days. Get help right away if: You have a very bad headache. You cannot stop vomiting. You have very bad pain or swelling around your face or eyes. You have trouble seeing. You feel confused. Your neck is stiff. You have trouble breathing. These  symptoms may be an emergency. Get help right away. Call 911. Do not wait to see if the symptoms will go away. Do not drive yourself to the hospital. Summary A sinus infection is swelling of your sinuses. Sinuses are hollow spaces in the bones around your face. This condition is caused by tissues in your nose that become inflamed or swollen. This traps germs. These can lead to infection. If you were prescribed an antibiotic medicine, take it as told by your doctor. Do not stop taking it even if you start to feel better. Keep all follow-up visits. This information is not intended to replace advice given to you by your health care provider. Make sure you discuss any questions you have with your health care provider. Document Revised: 01/05/2021 Document Reviewed: 01/05/2021 Elsevier Patient Education  2024 Else[employer] Inc. How to Perform a Sinus Rinse A sinus rinse is a home treatment. It rinses your sinuses with a mixture of salt and water (saline solution). Sinuses are air-filled spaces in your skull behind the bones of your face and forehead. They open into your nasal cavity. A sinus rinse can help to clear your nasal cavity. It can clear mucus, dirt, dust, or pollen. You may do a sinus rinse when you have: A cold. A virus. Allergies. A sinus infection. A stuffy  nose. What are the risks? A sinus rinse is normally very safe and helpful. However, there are a few risks. These include: A burning feeling in the sinuses. This may happen if you do not make the saline solution as told. Follow all directions. Nasal irritation. Infection from unclean water. This is rare, but it can happen. Do not do a sinus rinse if you have had: Ear or nasal surgery. An ear infection. Plugged ears. Supplies needed: Saline solution or powder. Distilled or germ-free (sterile) water may be needed to mix with saline powder. You may use boiled and cooled tap water. Boil tap water for 5 minutes. Cool the water until it is lukewarm. Use within 24 hours. Do not use regular tap water to mix with the saline solution. Neti pot or nasal rinse bottle. These release the saline solution into your nose and through your sinuses. You can buy neti pots and rinse bottles: At your local pharmacy. At a health food store. Online. How to do a sinus rinse  Wash your hands with soap and water for at least 20 seconds. If you cannot use soap and water, use hand sanitizer. Wash your device using the directions that came with it. Dry your device. Use the solution that comes with your device or one that is sold separately in stores. Follow the mixing directions on the package if you need to mix with germ-free or distilled water. Fill your device with the amount of saline solution stated in the device instructions. Stand by a sink and tilt your head sideways over the sink. Place the spout of the device in your upper nostril (the one closer to the ceiling). Gently pour or squeeze the saline solution into your nasal cavity. The liquid should drain to your lower nostril if you are not too stuffed up (congested). While rinsing, breathe through your open mouth. Gently blow your nose to clear any mucus and rinse solution. Blowing too hard may cause ear pain. Turn your head in the other direction and repeat  in your other nostril. Clean and rinse your device with clean water. Air-dry your device. Talk with your doctor or pharmacist if you have questions about  how to do a sinus rinse. Summary A sinus rinse is a home treatment. It rinses your sinuses with a mixture of salt and water (saline solution). A sinus rinse can clear mucus, dirt, dust, or pollen. A sinus rinse is normally very safe and helpful. Follow all instructions carefully. This information is not intended to replace advice given to you by your health care provider. Make sure you discuss any questions you have with your health care provider. Document Revised: 07/20/2020 Document Reviewed: 07/20/2020 Elsevier Patient Education  2024 Elsevier Inc.  The above assessment and management plan was discussed with the patient. The patient verbalized understanding of and has agreed to the management plan. Patient is aware to call the clinic if they develop any new symptoms or if symptoms persist or worsen. Patient is aware when to return to the clinic for a follow-up visit. Patient educated on when it is appropriate to go to the emergency department.    Nena Cassis Morton Hummer, [place] [facility] [address] [place], [place] [address] [phone]     [1]  Allergies Allergen Reactions   Fluticasone-Salmeterol Palpitations   Morphine Palpitations and Other (See Comments)    REACTION: heart racing   Peanut Oil Other (See Comments) and Cough    congested   Peanut-Containing Drug Products Cough   Sulfa Antibiotics Other (See Comments)    REACTION: takes away appetite, makes her feel nervous, jittery   Other Other (See Comments)    Oats and Green peppers---headaches   Penicillins Rash   Sulfamethoxazole Nausea Only   Sulfur Other (See Comments)    out of her body   Montelukast    Pioglitazone      Joint pain   Ciprofloxacin Anxiety   Clindamycin Rash   Cyclobenzaprine Other (See Comments)   Loracarbef  Rash        Metronidazole Other (See Comments)   Moxifloxacin Hcl In Nacl Anxiety   Penicillin G Sodium Rash   "

## 2024-02-27 ENCOUNTER — Ambulatory Visit: Payer: Self-pay

## 2024-02-27 NOTE — Telephone Encounter (Signed)
 FYI Only or Action Required?: FYI only for provider: appointment scheduled on 1.14.26.  Patient is followed in Pulmonology for asthma, last seen on 08/28/2023 by Darlean Ozell NOVAK, MD.  Called Nurse Triage reporting Shortness of Breath.  Symptoms began a week ago.  Interventions attempted: Prescription medications: prescibed an atbx but asked for it in liquid form.  Symptoms are: unchanged.  Triage Disposition: See HCP Within 4 Hours (Or PCP Triage)  Patient/caregiver understands and will follow disposition?: Yes     Copied from CRM [id number]. Topic: Clinical - Red Word Triage >> Feb 27, 2024  4:48 PM Rilla B wrote: Red Word that prompted transfer to Nurse Triage: Shortness of breath Reason for Disposition  [1] Longstanding difficulty breathing (e.g., CHF, COPD, emphysema) AND [2] WORSE than normal  Answer Assessment - Initial Assessment Questions Pt sees Dr. Darlean. She states she started to take plant vitamin last week and started to have symptoms shortly after that. She saw her PCP last week, states she wasn't feeling any better so she saw another dr at the practice yesterday and they gave her a zpack. She states she has a alpha gal allergy  and requested that the zpack be switched to liquids. She states she would like to see pulm to see if she even needs an atbx. She states she is having some drainage into her throat and is clearing her throat frequently. She states she is also having some ride sided pain in her back area. She states shortness of breath can be even at rest. Does not sound like in any distress at the moment. Looking for second opinion. Pt will be in [place], requesting appt in [place]. Scheduled.  Gave instructions on when to go to the Er. Pt stated understanding.    1. RESPIRATORY STATUS: Describe your breathing? (e.g., wheezing, shortness of breath, unable to speak, severe coughing)      Shortness of breath 2. ONSET: When did this breathing problem  begin?      Over a week 3. PATTERN Does the difficult breathing come and go, or has it been constant since it started?      intermittent 4. SEVERITY: How bad is your breathing? (e.g., mild, moderate, severe)      Mild to moderate 5. RECURRENT SYMPTOM: Have you had difficulty breathing before? If Yes, ask: When was the last time? and What happened that time?      Yes, hx of asthma 6. CARDIAC HISTORY: Do you have any history of heart disease? (e.g., heart attack, angina, bypass surgery, angioplasty)       7. LUNG HISTORY: Do you have any history of lung disease?  (e.g., pulmonary embolus, asthma, emphysema)     asthma 8. CAUSE: What do you think is causing the breathing problem?     Thinks maybe a reaction to a supplement she was taking 9. OTHER SYMPTOMS: Do you have any other symptoms? (e.g., chest pain, cough, dizziness, fever, runny nose)     Drainage into her throat, right back rib pain 10. O2 SATURATION MONITOR:  Do you use an oxygen saturation monitor (pulse oximeter) at home? If Yes, ask: What is your reading (oxygen level) today? What is your usual oxygen saturation reading? (e.g., 95%)       97-98%  Protocols used: Breathing Difficulty-A-AH

## 2024-02-28 ENCOUNTER — Ambulatory Visit (HOSPITAL_BASED_OUTPATIENT_CLINIC_OR_DEPARTMENT_OTHER)

## 2024-02-28 ENCOUNTER — Encounter (HOSPITAL_BASED_OUTPATIENT_CLINIC_OR_DEPARTMENT_OTHER): Payer: Self-pay

## 2024-02-28 ENCOUNTER — Ambulatory Visit: Admitting: Internal Medicine

## 2024-02-28 VITALS — BP 128/76 | HR 96 | Ht 61.0 in | Wt 133.0 lb

## 2024-02-28 VITALS — BP 136/70 | HR 106 | Ht 61.0 in | Wt 134.0 lb

## 2024-02-28 DIAGNOSIS — J069 Acute upper respiratory infection, unspecified: Secondary | ICD-10-CM

## 2024-02-28 DIAGNOSIS — J4530 Mild persistent asthma, uncomplicated: Secondary | ICD-10-CM

## 2024-02-28 DIAGNOSIS — M940 Chondrocostal junction syndrome [Tietze]: Secondary | ICD-10-CM

## 2024-02-28 DIAGNOSIS — Z794 Long term (current) use of insulin: Secondary | ICD-10-CM | POA: Diagnosis not present

## 2024-02-28 DIAGNOSIS — E113299 Type 2 diabetes mellitus with mild nonproliferative diabetic retinopathy without macular edema, unspecified eye: Secondary | ICD-10-CM | POA: Diagnosis not present

## 2024-02-28 LAB — POCT GLYCOSYLATED HEMOGLOBIN (HGB A1C): Hemoglobin A1C: 6.3 % — AB (ref 4.0–5.6)

## 2024-02-28 MED ORDER — LANTUS SOLOSTAR 100 UNIT/ML ~~LOC~~ SOPN: 30.0000 [IU] | PEN_INJECTOR | Freq: Every day | SUBCUTANEOUS | 4 refills | Status: AC

## 2024-02-28 NOTE — Patient Instructions (Addendum)
 Continue Lantus  30 units daily  Continue  Metformin  500 mg, 2 tablets twice daily    HOW TO TREAT LOW BLOOD SUGARS (Blood sugar LESS THAN 70 MG/DL) Please follow the RULE OF 15 for the treatment of hypoglycemia treatment (when your (blood sugars are less than 70 mg/dL)   STEP 1: Take 15 grams of carbohydrates when your blood sugar is low, which includes:  3-4 GLUCOSE TABS  OR 3-4 OZ OF JUICE OR REGULAR SODA OR ONE TUBE OF GLUCOSE GEL    STEP 2: RECHECK blood sugar in 15 MINUTES STEP 3: If your blood sugar is still low at the 15 minute recheck --> then, go back to STEP 1 and treat AGAIN with another 15 grams of carbohydrates.

## 2024-02-28 NOTE — Progress Notes (Signed)
 " Name: Stephanie Carroll  MRN/ DOB: [id number], 22-Jun-1955   Age/ Sex: 69 y.o., female    PCP: Lavell Bari LABOR, FNP   Reason for Endocrinology Evaluation: Type 2 Diabetes Mellitus     Date of Initial Endocrinology Visit: 10/29/2021    PATIENT IDENTIFIER: Stephanie Carroll is a 69 y.o. female with a past medical history of T2DM, Asthma, HTN and dyslipidemia, Hx of colon cancer. . The patient presented for initial endocrinology clinic visit on 10/29/2021 for consultative assistance with her diabetes management.    HPI: Stephanie Carroll was    Diagnosed with DM 2010 Prior Medications tried/Intolerance: She was prescribed Jardiance   but did not take it.              Hemoglobin A1c ranging from   7.9% in 2023, peaking at 9.4% in 2022   She was seen by [facility] 06/2021  On her initial visit to our clinic she had an A1c of 7.9%, she was on metformin  and basal insulin  only, will increase basal insulin    Started pioglitazone  03/2022 with an A1c 9.3% but discontinued due to severe joint pains   She opted to try Ozempic  because her brother had positive results with that 09/2022 but she did not start it as she was scared of side effects of pancreatitis   LEFT THYROID  NODULE : She is S/P FNA of the left thyroid  nodule 2.7 cm nodule in 2011 . Ultrasound showed stability in 2023  SUBJECTIVE:   During the last visit (10/09/2023): A1c 7.0%   Today (02/28/2024): Stephanie Carroll is here for a follow up on diabetes management.   He checks her blood sugars multiple times daily through Dexcom, patient has been noted with hypoglycemia. She is not symptomatic    She continues to follow-up with oncology due to history of colon cancer She has hx of chronic abdominal issues due to hx of colon cancer.  But here recently she has no abdominal pain She does have occasional loose stools but no constipation  She is recovering from a URI.  She did start a plant-based vitamin and developed shortness of breath, she has a  follow-up with pulmonary today at 3:30 PM  Unfortunately she lost her uncle last night due to influenza.  Her mother is currently hospitalized for pancreatitis   HOME DIABETES REGIMEN: Metformin  500 mg XR , 2 tabs BID Lantus  20 units daily - takes 35 units     Statin: yes ACE-I/ARB: no   CONTINUOUS GLUCOSE MONITORING RECORD INTERPRETATION    Dates of Recording:12/30-1/01/2025  Sensor description: Dexcom  Results statistics:   CGM use % of time 93  Average and SD 134/37  Time in range 89%  % Time Above 180 11  % Time above 250 0  % Time Below target 0   Glycemic patterns summary: BGs are optimal overnight and most of the day Hyperglycemic episodes postprandial  Hypoglycemic episodes occurred overnight  Overnight periods: Mostly optimal   DIABETIC COMPLICATIONS: Microvascular complications:  Mild nonproliferative DR Denies: CKD Last eye exam: Completed 09/06/2022  Macrovascular complications:   Denies: CAD, PVD, CVA   PAST HISTORY: Past Medical History:  Past Medical History:  Diagnosis Date   Allergy     Arthritis    DDD   Asthma    Colon cancer (HCC) 2008   Diabetes mellitus    Elevated liver enzymes    GERD (gastroesophageal reflux disease)    History of thyroiditis 12/18/2007   Formatting of this  note might be different from the original. Thyroiditis   Hyperlipidemia    Iron deficiency anemia due to chronic blood loss 09/17/2020   Thyroid  tumor, benign    Past Surgical History:  Past Surgical History:  Procedure Laterality Date   BIOPSY THYROID      X2   CESAREAN SECTION     x 1   COLON SURGERY     FOOT FRACTURE SURGERY Left 02/2015   INCISIONAL HERNIA REPAIR     NASAL SEPTUM SURGERY     PARTIAL HYSTERECTOMY     POLYPECTOMY     UPPER GASTROINTESTINAL ENDOSCOPY     WRIST GANGLION EXCISION     left    Social History:  reports that she has never smoked. She has never used smokeless tobacco. She reports that she does not drink alcohol and  does not use drugs. Family History:  Family History  Problem Relation Age of Onset   COPD Other        both sides   Diabetes Other        both sides   Heart disease Father    Kidney failure Father    Lung cancer Paternal Uncle    Leukemia Paternal Grandfather    Cervical cancer Paternal Aunt        x 2    Heart defect Mother        MVP   Breast cancer Mother    Colon cancer Neg Hx    Esophageal cancer Neg Hx    Rectal cancer Neg Hx    Stomach cancer Neg Hx      HOME MEDICATIONS: Allergies as of 02/28/2024       Reactions   Fluticasone-salmeterol Palpitations   Morphine Palpitations, Other (See Comments)   REACTION: heart racing   Peanut Oil Other (See Comments), Cough   congested   Peanut-containing Drug Products Cough   Sulfa Antibiotics Other (See Comments)   REACTION: takes away appetite, makes her feel nervous, jittery   Other Other (See Comments)   Oats and Green peppers---headaches   Penicillins Rash   Sulfamethoxazole Nausea Only   Sulfur Other (See Comments)   out of her body   Montelukast    Pioglitazone     Joint pain   Ciprofloxacin Anxiety   Clindamycin Rash   Cyclobenzaprine Other (See Comments)   Loracarbef Rash      Metronidazole Other (See Comments)   Moxifloxacin Hcl In Nacl Anxiety   Penicillin G Sodium Rash        Medication List        Accurate as of February 28, 2024  2:23 PM. If you have any questions, ask your nurse or doctor.          Aerochamber Plus Device Use as directed   albuterol  108 (90 Base) MCG/ACT inhaler Commonly known as: VENTOLIN  HFA Inhale 1-2 puffs into the lungs every 4 (four) hours as needed. For shortness for breath   Asmanex  HFA 200 MCG/ACT Aero Generic drug: Mometasone Furoate  Inhale 1 puff into the lungs in the morning and at bedtime.   azelastine  0.1 % nasal spray Commonly known as: ASTELIN  Place 1 spray into both nostrils 2 (two) times daily. Use in each nostril as directed   azithromycin   250 MG tablet Commonly known as: ZITHROMAX  Take 2 tablets on day 1, then 1 tablet daily on days 2 through 5   BLACK ELDERBERRY PO Take 1 tablet by mouth daily.   cetirizine  10 MG tablet Commonly known  as: ZyrTEC  Allergy  Take 1 tablet (10 mg total) by mouth daily.   Dexcom G7 Receiver Devi USE AS DIRECTED   Dexcom G7 Sensor Misc USE AS DIRECTED   EPINEPHrine  0.3 mg/0.3 mL Soaj injection Commonly known as: EPI-PEN Inject 0.3 mg into the muscle as needed for anaphylaxis.   Neffy  2 MG/0.1ML Soln Generic drug: EPINEPHrine  Place 2 mg into the nose as needed (anaphylaxis).   ferrous gluconate  324 MG tablet Commonly known as: FERGON Take 1 tablet (324 mg total) by mouth daily with breakfast.   Insulin  Pen Needle 32G X 4 MM Misc 1 Device by Does not apply route daily in the afternoon.   Lantus  SoloStar 100 UNIT/ML Solostar Pen Generic drug: insulin  glargine Inject 20 Units into the skin daily.   levalbuterol 1.25 MG/3ML nebulizer solution Commonly known as: XOPENEX Take 1.25 mg by nebulization as needed for wheezing.   metFORMIN  500 MG tablet Commonly known as: GLUCOPHAGE  Take 2 tablets (1,000 mg total) by mouth 2 (two) times daily with a meal.   NON FORMULARY Plexus Probio 5 dietary supplement   NON FORMULARY Plexus Slim Hunger Control weight management series   ondansetron  4 MG/5ML solution Commonly known as: ZOFRAN  Take 5 mLs (4 mg total) by mouth daily as needed for nausea or vomiting.   OneTouch Verio Reflect w/Device Kit USE TO TEST BLOOD SUGAR ONCE DAILY IN THE EVENING   OneTouch Verio test strip Generic drug: glucose blood USE TO CHECK BLOOD SUGAR 2 (TWO) TIMES DAILY. USE AS INSTRUCTED   RABEprazole  20 MG tablet Commonly known as: ACIPHEX  Take 1 tablet (20 mg total) by mouth 2 (two) times daily.   rosuvastatin  5 MG tablet Commonly known as: Crestor  Take 1 tablet (5 mg total) by mouth daily.   Vitamin D  (Ergocalciferol ) 1.25 MG (50000 UNIT) Caps  capsule Commonly known as: DRISDOL  Take 1 capsule (50,000 Units total) by mouth every 7 (seven) days.         ALLERGIES: Allergies  Allergen Reactions   Fluticasone-Salmeterol Palpitations   Morphine Palpitations and Other (See Comments)    REACTION: heart racing   Peanut Oil Other (See Comments) and Cough    congested   Peanut-Containing Drug Products Cough   Sulfa Antibiotics Other (See Comments)    REACTION: takes away appetite, makes her feel nervous, jittery   Other Other (See Comments)    Oats and Green peppers---headaches   Penicillins Rash   Sulfamethoxazole Nausea Only   Sulfur Other (See Comments)    out of her body   Montelukast    Pioglitazone      Joint pain   Ciprofloxacin Anxiety   Clindamycin Rash   Cyclobenzaprine Other (See Comments)   Loracarbef Rash        Metronidazole Other (See Comments)   Moxifloxacin Hcl In Nacl Anxiety   Penicillin G Sodium Rash     REVIEW OF SYSTEMS: A comprehensive ROS was conducted with the patient and is negative except as per HPI    OBJECTIVE:   VITAL SIGNS: BP 128/76   Pulse 96   Ht 5' 1 (1.549 m)   Wt 133 lb (60.3 kg)   SpO2 96%   BMI 25.13 kg/m    PHYSICAL EXAM:  General: Pt appears well and is in NAD  Lungs: Clear with good BS bilat with no rales, rhonchi, or wheezes  Heart: RRR   Extremities:  Lower extremities - No pretibial edema.   Neuro: MS is good with appropriate affect, pt is alert  and Ox3   DM Foot Exam 10/09/2023  The skin of the feet is intact without sores or ulcerations. The pedal pulses are 2+ on right and 2+ on left. The sensation is intact to a screening 5.07, 10 gram monofilament bilaterally   DATA REVIEWED:  Lab Results  Component Value Date   HGBA1C 6.3 (A) 02/28/2024   HGBA1C 7.0 (H) 09/27/2023   HGBA1C 7.8 (A) 05/26/2023    Latest Reference Range & Units 09/27/23 09:54  Sodium 135 - 145 mmol/L 136  Potassium 3.5 - 5.1 mmol/L 5.2 (H)  Chloride 98 - 111 mmol/L 102   CO2 22 - 32 mmol/L 21 (L)  Glucose 70 - 99 mg/dL 789 (H)  Mean Plasma Glucose mg/dL 845  BUN 8 - 23 mg/dL 17  Creatinine 9.55 - 8.99 mg/dL 8.90 (H)  Calcium  8.9 - 10.3 mg/dL 9.7  Anion gap 5 - 15  14  Alkaline Phosphatase 38 - 126 U/L 85  Albumin 3.5 - 5.0 g/dL 4.5  AST 15 - 41 U/L 33  ALT 0 - 44 U/L 34  Total Protein 6.5 - 8.1 g/dL 7.7  Total Bilirubin 0.0 - 1.2 mg/dL 0.4  GFR, Est Non African American >60 mL/min 56 (L)     Old records , labs and images have been reviewed.   ASSESSMENT / PLAN / RECOMMENDATIONS:   1) Type 2 Diabetes Mellitus, Optimally controlled, With retinopathic  complications - Most recent A1c of 6.3 %. Goal A1c < 7.0 %.    -A1c has trended down from 7.0% to 6.3%, I suspect this is due to recurrent hypoglycemia.  I did express my concern for asymptomatic hypoglycemia, I did discourage the patient from self adjusting basal insulin , as the last visit she was on 20 units, today she tells me she has been taking 35 units of Lantus , and the visit prior to that she was taking 60 units of Lantus , this practice increases the risk of hypoglycemia -She has history of chronic chronic GI issues since her colon cancer treatment, her brother is on Ozempic  she initially requested to try it, but became skeptical about side effects -She stopped pioglitazone  due to severe joint pains -I will decrease her basal insulin  as below - If the patient continues with postprandial hyperglycemia we may consider sulfonylureas  MEDICATIONS:   Decrease Lantus  30 units daily Continue metformin  500 mg, 2 tabs twice daily  EDUCATION / INSTRUCTIONS: BG monitoring instructions: Patient is instructed to check her blood sugars 2 times a day. Call [facility] if: BG persistently < 70  I reviewed the Rule of 15 for the treatment of hypoglycemia in detail with the patient. Literature supplied.   2) Diabetic complications:  Eye: Does  have known diabetic retinopathy.  Neuro/  Feet: Does not have known diabetic peripheral neuropathy. Renal: Patient does not at have known baseline CKD. She is  on an ACEI/ARB at present.  3) Dyslipidemia:   -LDL above goal at 124 mg/DL, she was prescribed Crestor  by her PCP but developed arthralgias and discontinued September, 2024   Follow-up in 6 months    Signed electronically by: Stefano Redgie Butts, MD  [facility] End[facility]  [facility] [address] [place] [place], [place] [address] Phone: [phone] FAX: [phone]   CC: Lavell Bari LABOR, FNP [address] [place] [address] Phone: [phone]  Fax: [phone]    Return to Endocrinology clinic as below: Future Appointments  Date Time Provider Department Center  02/28/2024  3:30 PM Charley Conger, PA-C [id number] [id number]  03/15/2024  3:00 PM Mallipeddi, Diannah SQUIBB, MD [facility] [facility]  04/17/2024  2:10 PM Shila Gustav GAILS, MD [facility]  04/26/2024  2:10 PM Lavell Bari LABOR, FNP [facility] [address]  05/08/2024  1:00 PM Kristine Shuck D, RD [facility] None  07/12/2024 12:00 PM [facility] None  07/12/2024 12:15 PM Ennever, Maude SAUNDERS, MD [facility] None  08/21/2024 11:15 AM Iva Marty Saltness, MD [facility] None  11/25/2024 11:20 AM [facility]AL WELLNESS VISIT [facility] [address]    "

## 2024-02-28 NOTE — Progress Notes (Signed)
 "  @Patient  ID: Stephanie Carroll, female    DOB: February 11, 1956, 69 y.o.   MRN: [id number]  Chief Complaint  Patient presents with   Shortness of Breath    Referring provider: Lavell Bari LABOR, FNP  HPI: Discussed the use of AI scribe software for clinical note transcription with the patient, who gave verbal consent to proceed.  History of Present Illness Stephanie Carroll is a 69 year old female with asthma and alpha-gal syndrome who presents with shortness of breath and rib pain.  She has been experiencing shortness of breath and rib pain for the past week. The shortness of breath is persistent, requiring deep breaths occasionally. Rib soreness is present, which she associates with previous episodes of costochondritis. Both COVID-19 and flu tests were negative.  She was seen by her PCP in the last week and diagnosed with a viral URI.  She was prescribed a Zpak but did not end up taking it.  She reports that she is actually feeling better today but did not want to cancel her appointment.  She denies chest pain, productive cough, fever, or leg pain.  She has a history of asthma and is currently taking Asmanex  daily. She has been using Ventolin  twice a day since the onset of her symptoms, which is unusual for her as she typically does not need to use her rescue inhaler. Xopenex is available as a nebulizer but has not been used recently.  She has alpha-gal syndrome, a tick-borne illness, and suspects a possible allergic reaction to a plant-based vitamin taken prior to symptom onset. She takes Zyrtec  daily. She has not taken any other medication for it.  No fever, but she reports chills. No cough is present, but throat drainage is noted, and she has been using saline spray for relief. She is concerned about pneumonia due to her symptoms. She recalls a past episode of pneumonia after a flu shot.   Last OV 08/28/2023: 68  yowf never smoker retired from [employer] x decades referred to pulmonary  clinic in [place]  08/25/2020 by Jacolyn Purdue PA for asthma first developed as a child but no memory of it and recurred in her 30s requiring freq pred but since started on asmanex  200 one bid feels it's been better controlled and main issue is access thru her insurance     Allergies[1]  Immunization History  Administered Date(s) Administered   Tdap 02/13/2007, 07/05/2021    Past Medical History:  Diagnosis Date   Allergy     Arthritis    DDD   Asthma    Colon cancer (HCC) 2008   Diabetes mellitus    Elevated liver enzymes    GERD (gastroesophageal reflux disease)    History of thyroiditis 12/18/2007   Formatting of this note might be different from the original. Thyroiditis   Hyperlipidemia    Iron deficiency anemia due to chronic blood loss 09/17/2020   Thyroid  tumor, benign     Tobacco History: Tobacco Use History[2] Counseling given: Not Answered Tobacco comments: never used product   Outpatient Medications Prior to Visit  Medication Sig Dispense Refill   azelastine  (ASTELIN ) 0.1 % nasal spray Place 1 spray into both nostrils 2 (two) times daily. Use in each nostril as directed 30 mL 5   azithromycin  (ZITHROMAX ) 250 MG tablet Take 2 tablets on day 1, then 1 tablet daily on days 2 through 5 6 tablet 0   BLACK ELDERBERRY PO Take 1 tablet by mouth daily.  Blood Glucose Monitoring Suppl (ONETOUCH VERIO REFLECT) w/Device KIT USE TO TEST BLOOD SUGAR ONCE DAILY IN THE EVENING 1 kit 0   cetirizine  (ZYRTEC  ALLERGY ) 10 MG tablet Take 1 tablet (10 mg total) by mouth daily. 90 tablet 1   Continuous Glucose Sensor (DEXCOM G7 SENSOR) MISC USE AS DIRECTED 9 each 3   EPINEPHrine  (NEFFY ) 2 MG/0.1ML SOLN Place 2 mg into the nose as needed (anaphylaxis). 2 each 1   EPINEPHrine  0.3 mg/0.3 mL IJ SOAJ injection Inject 0.3 mg into the muscle as needed for anaphylaxis. 1 each 11   insulin  glargine (LANTUS  SOLOSTAR) 100 UNIT/ML Solostar Pen Inject 30 Units into the skin daily. 30 mL 4    Insulin  Pen Needle 32G X 4 MM MISC 1 Device by Does not apply route daily in the afternoon. 100 each 3   levalbuterol (XOPENEX) 1.25 MG/3ML nebulizer solution Take 1.25 mg by nebulization as needed for wheezing.     metFORMIN  (GLUCOPHAGE ) 500 MG tablet Take 2 tablets (1,000 mg total) by mouth 2 (two) times daily with a meal. 360 tablet 3   Mometasone Furoate  (ASMANEX  HFA) 200 MCG/ACT AERO Inhale 1 puff into the lungs in the morning and at bedtime. 1 each 11   ONETOUCH VERIO test strip USE TO CHECK BLOOD SUGAR 2 (TWO) TIMES DAILY. USE AS INSTRUCTED 200 strip 3   RABEprazole  (ACIPHEX ) 20 MG tablet Take 1 tablet (20 mg total) by mouth 2 (two) times daily. 180 tablet 3   albuterol  (VENTOLIN  HFA) 108 (90 Base) MCG/ACT inhaler Inhale 1-2 puffs into the lungs every 4 (four) hours as needed. For shortness for breath 18 g 11   ferrous gluconate  (FERGON) 324 MG tablet Take 1 tablet (324 mg total) by mouth daily with breakfast. (Patient not taking: Reported on 02/28/2024) 60 tablet 3   NON FORMULARY Plexus Probio 5 dietary supplement (Patient not taking: Reported on 02/28/2024)     NON FORMULARY Plexus Slim Hunger Control weight management series (Patient not taking: Reported on 02/28/2024)     ondansetron  (ZOFRAN ) 4 MG/5ML solution Take 5 mLs (4 mg total) by mouth daily as needed for nausea or vomiting. (Patient not taking: Reported on 02/28/2024) 50 mL 2   rosuvastatin  (CRESTOR ) 5 MG tablet Take 1 tablet (5 mg total) by mouth daily. (Patient not taking: Reported on 02/28/2024) 90 tablet 3   Spacer/Aero-Holding Chambers (AEROCHAMBER PLUS WITH MASK) inhaler Use as directed 1 each 11   Vitamin D , Ergocalciferol , (DRISDOL ) 1.25 MG (50000 UNIT) CAPS capsule Take 1 capsule (50,000 Units total) by mouth every 7 (seven) days. (Patient not taking: Reported on 02/28/2024) 12 capsule 3   No facility-administered medications prior to visit.     Review of Systems: as per hpi  Constitutional:   No  weight loss, night  sweats,  Fevers, chills, fatigue, or  lassitude.  HEENT:   No headaches,  Difficulty swallowing,  Tooth/dental problems, or  Sore throat,                No sneezing, itching, ear ache, nasal congestion, post nasal drip,   CV:  No chest pain,  Orthopnea, PND, swelling in lower extremities, anasarca, dizziness, palpitations, syncope.   GI  No heartburn, indigestion, abdominal pain, nausea, vomiting, diarrhea, change in bowel habits, loss of appetite, bloody stools.   Resp: No shortness of breath with exertion or at rest.  No excess mucus, no productive cough,  No non-productive cough,  No coughing up of blood.  No change in  color of mucus.  No wheezing.  No chest wall deformity  Skin: no rash or lesions.  GU: no dysuria, change in color of urine, no urgency or frequency.  No flank pain, no hematuria   MS:  No joint pain or swelling.  No decreased range of motion.  No back pain.    Physical Exam  BP 136/70   Pulse (!) 106   Ht 5' 1 (1.549 m)   Wt 134 lb (60.8 kg)   SpO2 100%   BMI 25.32 kg/m   GEN: A/Ox3; pleasant , NAD, well nourished.  Speaks in full sentences- no distress.   HEENT:  [place]/AT,  EACs-clear, TMs-wnl, NOSE-clear, THROAT-clear, no lesions, no postnasal drip or exudate noted.   NECK:  Supple w/ fair ROM; no JVD; normal carotid impulses w/o bruits; no thyromegaly or nodules palpated; no lymphadenopathy.    RESP  Clear  P & A; w/o, wheezes/ rales/ or rhonchi. no accessory muscle use, no dullness to percussion  CARD:  RRR, no m/r/g, no peripheral edema, pulses intact, no cyanosis or clubbing.  GI:   Soft & nt; nml bowel sounds; no organomegaly or masses detected.   Musco: Warm bil, no deformities or joint swelling noted.   Neuro: alert, no focal deficits noted.    Skin: Warm, no lesions or rashes    Lab Results:  CBC    Component Value Date/Time   WBC 9.6 12/22/2023 1059   WBC 9.1 03/19/2022 1103   RBC 3.90 12/22/2023 1059   HGB 12.2 12/22/2023 1059    HGB 12.9 10/27/2023 1507   HGB 12.7 04/22/2016 1456   HGB 14.7 06/12/2009 0841   HCT 36.3 12/22/2023 1059   HCT 38.5 10/27/2023 1507   HCT 38.6 04/22/2016 1456   HCT 42.3 06/12/2009 0841   PLT 180 12/22/2023 1059   PLT 200 10/27/2023 1507   MCV 93.1 12/22/2023 1059   MCV 94 10/27/2023 1507   MCV 83 04/22/2016 1456   MCV 90.0 06/12/2009 0841   MCH 31.3 12/22/2023 1059   MCHC 33.6 12/22/2023 1059   RDW 12.6 12/22/2023 1059   RDW 13.3 10/27/2023 1507   RDW 14.2 04/22/2016 1456   RDW 13.4 06/12/2009 0841   LYMPHSABS 2.0 12/22/2023 1059   LYMPHSABS 2.7 10/27/2023 1507   LYMPHSABS 2.7 04/22/2016 1456   LYMPHSABS 3.0 06/12/2009 0841   MONOABS 0.6 12/22/2023 1059   MONOABS 0.6 06/12/2009 0841   EOSABS 0.2 12/22/2023 1059   EOSABS 0.2 10/27/2023 1507   EOSABS 0.2 04/22/2016 1456   BASOSABS 0.1 12/22/2023 1059   BASOSABS 0.1 10/27/2023 1507   BASOSABS 0.1 04/22/2016 1456   BASOSABS 0.0 06/12/2009 0841    BMET    Component Value Date/Time   NA 142 12/22/2023 1059   NA 138 10/27/2023 1507   NA 141 11/29/2013 1454   K 4.8 12/22/2023 1059   K 4.1 04/22/2016 1456   K 3.8 11/29/2013 1454   CL 105 12/22/2023 1059   CL 95 (L) 04/22/2016 1456   CL 98 11/29/2013 1454   CO2 24 12/22/2023 1059   CO2 21 04/22/2016 1456   CO2 25 11/29/2013 1454   GLUCOSE 202 (H) 12/22/2023 1059   GLUCOSE 224 (H) 11/29/2013 1454   BUN 10 12/22/2023 1059   BUN 14 10/27/2023 1507   BUN 12 11/29/2013 1454   CREATININE 0.98 12/22/2023 1059   CREATININE 0.55 (L) 04/22/2016 1456   CREATININE 0.8 11/29/2013 1454   CALCIUM  9.6 12/22/2023 1059  CALCIUM  9.5 04/22/2016 1456   CALCIUM  9.2 11/29/2013 1454   GFRNONAA >60 12/22/2023 1059   GFRAA >60 07/17/2019 1153    BNP No results found for: BNP  ProBNP No results found for: PROBNP  Imaging: No results found.  Administration History     None           No data to display          No results found for: NITRICOXIDE   Assessment  & Plan:   Assessment & Plan Chronic asthma, mild persistent, uncomplicated  Viral URI  Assessment and Plan Assessment & Plan Asthma Increased Ventolin  use with minimal benefit, no wheezing, oxygen saturation 100%; no indication for steroids at this point. - Continue Asmanex  daily. - Use Ventolin  as needed. - Consider Benadryl  for allergies. - Patient also wishes to avoid steroids due to past adverse effects.  Alpha-gal syndrome Potential reaction to plant-based vitamin, concerns about lactose in medications. - Continue Zyrtec  daily. - Consider liquid Benadryl . - Avoid lactose-containing medications.  Costochondritis Rib pain improving, no imaging or antibiotics needed. - Continue heating pad. - Consider Tylenol  if needed.    Follow up yearly as scheduled.  Return to clinic sooner if new or worsening symptoms.  Patient declined AVS.  Candis Dandy, PA-C 02/28/2024      [1]  Allergies Allergen Reactions   Fluticasone-Salmeterol Palpitations   Morphine Palpitations and Other (See Comments)    REACTION: heart racing   Peanut Oil Other (See Comments) and Cough    congested   Peanut-Containing Drug Products Cough   Sulfa Antibiotics Other (See Comments)    REACTION: takes away appetite, makes her feel nervous, jittery   Other Other (See Comments)    Oats and Green peppers---headaches   Penicillins Rash   Sulfamethoxazole Nausea Only   Sulfur Other (See Comments)    out of her body   Montelukast    Pioglitazone      Joint pain   Ciprofloxacin Anxiety   Clindamycin Rash   Cyclobenzaprine Other (See Comments)   Loracarbef Rash        Metronidazole Other (See Comments)   Moxifloxacin Hcl In Nacl Anxiety   Penicillin G Sodium Rash  [2]  Social History Tobacco Use  Smoking Status Never  Smokeless Tobacco Never  Tobacco Comments   never used product   "

## 2024-03-04 NOTE — Telephone Encounter (Signed)
 Called unable to lm on vm. LS

## 2024-03-15 ENCOUNTER — Ambulatory Visit: Admitting: Internal Medicine

## 2024-04-17 ENCOUNTER — Ambulatory Visit: Admitting: Gastroenterology

## 2024-04-26 ENCOUNTER — Ambulatory Visit: Payer: Self-pay | Admitting: Family

## 2024-05-08 ENCOUNTER — Encounter: Admitting: Nutrition

## 2024-05-27 ENCOUNTER — Ambulatory Visit: Admitting: Internal Medicine

## 2024-07-03 ENCOUNTER — Ambulatory Visit: Admitting: Internal Medicine

## 2024-07-12 ENCOUNTER — Ambulatory Visit: Admitting: Hematology & Oncology

## 2024-07-12 ENCOUNTER — Inpatient Hospital Stay

## 2024-08-21 ENCOUNTER — Ambulatory Visit: Admitting: Allergy & Immunology

## 2024-11-25 ENCOUNTER — Ambulatory Visit: Payer: Self-pay
